# Patient Record
Sex: Male | Born: 1960
Health system: Southern US, Community
[De-identification: ages and names within clinical notes are randomized; demographics above are authoritative.]

## PROBLEM LIST (undated history)

## (undated) ENCOUNTER — Ambulatory Visit: Payer: BC Managed Care – PPO | Source: Home / Self Care

## (undated) DIAGNOSIS — I10 Essential (primary) hypertension: Secondary | ICD-10-CM

## (undated) DIAGNOSIS — T7840XA Allergy, unspecified, initial encounter: Secondary | ICD-10-CM

## (undated) DIAGNOSIS — L723 Sebaceous cyst: Secondary | ICD-10-CM

## (undated) DIAGNOSIS — Z87442 Personal history of urinary calculi: Secondary | ICD-10-CM

## (undated) DIAGNOSIS — F419 Anxiety disorder, unspecified: Secondary | ICD-10-CM

## (undated) DIAGNOSIS — E785 Hyperlipidemia, unspecified: Secondary | ICD-10-CM

## (undated) DIAGNOSIS — E119 Type 2 diabetes mellitus without complications: Secondary | ICD-10-CM

## (undated) DIAGNOSIS — F32A Depression, unspecified: Secondary | ICD-10-CM

## (undated) DIAGNOSIS — F329 Major depressive disorder, single episode, unspecified: Secondary | ICD-10-CM

## (undated) DIAGNOSIS — R011 Cardiac murmur, unspecified: Secondary | ICD-10-CM

## (undated) HISTORY — DX: Anxiety disorder, unspecified: F41.9

## (undated) HISTORY — DX: Allergy, unspecified, initial encounter: T78.40XA

## (undated) HISTORY — PX: TYMPANOSTOMY TUBE PLACEMENT: SHX32

## (undated) HISTORY — PX: TONSILLECTOMY: SHX5217

## (undated) HISTORY — DX: Hyperlipidemia, unspecified: E78.5

## (undated) HISTORY — DX: Cardiac murmur, unspecified: R01.1

## (undated) HISTORY — DX: Personal history of urinary calculi: Z87.442

## (undated) HISTORY — DX: Sebaceous cyst: L72.3

## (undated) HISTORY — DX: Essential (primary) hypertension: I10

## (undated) HISTORY — DX: Major depressive disorder, single episode, unspecified: F32.9

## (undated) HISTORY — PX: OTHER SURGICAL HISTORY: SHX169

## (undated) HISTORY — DX: Depression, unspecified: F32.A

---

## 2005-08-01 ENCOUNTER — Ambulatory Visit: Payer: Self-pay | Admitting: Internal Medicine

## 2005-12-06 ENCOUNTER — Ambulatory Visit: Payer: Self-pay | Admitting: Internal Medicine

## 2005-12-12 ENCOUNTER — Ambulatory Visit: Payer: Self-pay | Admitting: Internal Medicine

## 2006-04-13 ENCOUNTER — Ambulatory Visit: Payer: Self-pay | Admitting: Internal Medicine

## 2006-04-17 ENCOUNTER — Ambulatory Visit: Payer: Self-pay | Admitting: Internal Medicine

## 2006-08-15 ENCOUNTER — Ambulatory Visit: Payer: Self-pay | Admitting: Internal Medicine

## 2006-12-11 ENCOUNTER — Ambulatory Visit: Payer: Self-pay | Admitting: Family Medicine

## 2008-01-30 ENCOUNTER — Telehealth: Payer: Self-pay | Admitting: Internal Medicine

## 2008-02-06 ENCOUNTER — Ambulatory Visit: Payer: Self-pay | Admitting: Internal Medicine

## 2008-02-06 DIAGNOSIS — R51 Headache: Secondary | ICD-10-CM | POA: Insufficient documentation

## 2008-02-06 DIAGNOSIS — F411 Generalized anxiety disorder: Secondary | ICD-10-CM | POA: Insufficient documentation

## 2008-02-06 DIAGNOSIS — I10 Essential (primary) hypertension: Secondary | ICD-10-CM | POA: Insufficient documentation

## 2008-02-06 DIAGNOSIS — F3289 Other specified depressive episodes: Secondary | ICD-10-CM | POA: Insufficient documentation

## 2008-02-06 DIAGNOSIS — F988 Other specified behavioral and emotional disorders with onset usually occurring in childhood and adolescence: Secondary | ICD-10-CM | POA: Insufficient documentation

## 2008-02-06 DIAGNOSIS — E785 Hyperlipidemia, unspecified: Secondary | ICD-10-CM | POA: Insufficient documentation

## 2008-02-06 DIAGNOSIS — F329 Major depressive disorder, single episode, unspecified: Secondary | ICD-10-CM

## 2008-02-06 DIAGNOSIS — J309 Allergic rhinitis, unspecified: Secondary | ICD-10-CM | POA: Insufficient documentation

## 2008-02-06 DIAGNOSIS — R519 Headache, unspecified: Secondary | ICD-10-CM | POA: Insufficient documentation

## 2008-02-06 LAB — CONVERTED CEMR LAB
Cholesterol, target level: 200 mg/dL
HDL goal, serum: 40 mg/dL
LDL Goal: 130 mg/dL

## 2008-02-10 DIAGNOSIS — Z87442 Personal history of urinary calculi: Secondary | ICD-10-CM | POA: Insufficient documentation

## 2008-02-28 ENCOUNTER — Ambulatory Visit: Payer: Self-pay | Admitting: Internal Medicine

## 2008-03-02 LAB — CONVERTED CEMR LAB
Basophils Absolute: 0.1 10*3/uL (ref 0.0–0.1)
Bilirubin, Direct: 0.1 mg/dL (ref 0.0–0.3)
Calcium: 9.3 mg/dL (ref 8.4–10.5)
Cholesterol: 201 mg/dL (ref 0–200)
Direct LDL: 136.7 mg/dL
GFR calc Af Amer: 83 mL/min
Glucose, Bld: 103 mg/dL — ABNORMAL HIGH (ref 70–99)
HCT: 50.1 % (ref 39.0–52.0)
Hemoglobin: 16.8 g/dL (ref 13.0–17.0)
MCHC: 33.5 g/dL (ref 30.0–36.0)
Monocytes Absolute: 0.7 10*3/uL (ref 0.2–0.7)
Neutro Abs: 5.8 10*3/uL (ref 1.4–7.7)
RDW: 12.3 % (ref 11.5–14.6)
Sodium: 136 meq/L (ref 135–145)
TSH: 0.72 microintl units/mL (ref 0.35–5.50)
Total Bilirubin: 0.9 mg/dL (ref 0.3–1.2)
Total Protein: 6.4 g/dL (ref 6.0–8.3)

## 2008-03-10 ENCOUNTER — Ambulatory Visit: Payer: Self-pay | Admitting: Internal Medicine

## 2008-03-10 DIAGNOSIS — T50995A Adverse effect of other drugs, medicaments and biological substances, initial encounter: Secondary | ICD-10-CM | POA: Insufficient documentation

## 2008-06-23 ENCOUNTER — Ambulatory Visit: Payer: Self-pay | Admitting: Internal Medicine

## 2008-06-23 LAB — CONVERTED CEMR LAB
Cholesterol: 237 mg/dL (ref 0–200)
VLDL: 27 mg/dL (ref 0–40)

## 2008-06-30 ENCOUNTER — Ambulatory Visit: Payer: Self-pay | Admitting: Internal Medicine

## 2008-09-25 ENCOUNTER — Ambulatory Visit: Payer: Self-pay | Admitting: Internal Medicine

## 2008-09-25 DIAGNOSIS — M549 Dorsalgia, unspecified: Secondary | ICD-10-CM | POA: Insufficient documentation

## 2008-09-25 LAB — CONVERTED CEMR LAB
Bilirubin Urine: NEGATIVE
Blood in Urine, dipstick: NEGATIVE
Urobilinogen, UA: 0.2

## 2008-12-24 ENCOUNTER — Ambulatory Visit: Payer: Self-pay | Admitting: Internal Medicine

## 2008-12-24 LAB — CONVERTED CEMR LAB
Cholesterol: 228 mg/dL (ref 0–200)
HDL: 41.5 mg/dL (ref >39.0)
Total CHOL/HDL Ratio: 5.5
Triglycerides: 86 mg/dL (ref 0–149)
VLDL: 17 mg/dL (ref 0–40)

## 2008-12-31 ENCOUNTER — Ambulatory Visit: Payer: Self-pay | Admitting: Internal Medicine

## 2009-03-26 ENCOUNTER — Ambulatory Visit: Payer: Self-pay | Admitting: Family Medicine

## 2009-03-26 DIAGNOSIS — J209 Acute bronchitis, unspecified: Secondary | ICD-10-CM | POA: Insufficient documentation

## 2009-04-15 ENCOUNTER — Ambulatory Visit: Payer: Self-pay | Admitting: Internal Medicine

## 2009-04-15 DIAGNOSIS — R5383 Other fatigue: Secondary | ICD-10-CM

## 2009-04-15 DIAGNOSIS — R5381 Other malaise: Secondary | ICD-10-CM | POA: Insufficient documentation

## 2010-03-23 ENCOUNTER — Ambulatory Visit: Payer: Self-pay | Admitting: Internal Medicine

## 2010-03-23 LAB — CONVERTED CEMR LAB
ALT: 31 units/L (ref 0–53)
Albumin: 4.3 g/dL (ref 3.5–5.2)
Alkaline Phosphatase: 54 units/L (ref 39–117)
Basophils Relative: 0.3 % (ref 0.0–3.0)
Bilirubin Urine: NEGATIVE
Bilirubin, Direct: 0 mg/dL (ref 0.0–0.3)
CO2: 27 meq/L (ref 19–32)
Chloride: 108 meq/L (ref 96–112)
Direct LDL: 192 mg/dL
Eosinophils Absolute: 0.2 10*3/uL (ref 0.0–0.7)
Eosinophils Relative: 2.4 % (ref 0.0–5.0)
Hemoglobin: 17 g/dL (ref 13.0–17.0)
Lymphocytes Relative: 28.3 % (ref 12.0–46.0)
MCHC: 34.4 g/dL (ref 30.0–36.0)
MCV: 91.3 fL (ref 78.0–100.0)
Monocytes Absolute: 0.8 10*3/uL (ref 0.1–1.0)
Neutro Abs: 5.5 10*3/uL (ref 1.4–7.7)
Neutrophils Relative %: 59.9 % (ref 43.0–77.0)
Nitrite: NEGATIVE
Potassium: 4.1 meq/L (ref 3.5–5.1)
Protein, U semiquant: NEGATIVE
RBC: 5.42 M/uL (ref 4.22–5.81)
Sodium: 143 meq/L (ref 135–145)
Total CHOL/HDL Ratio: 5
Total Protein: 6.9 g/dL (ref 6.0–8.3)
Urobilinogen, UA: 0.2
VLDL: 31 mg/dL (ref 0.0–40.0)
WBC Urine, dipstick: NEGATIVE
WBC: 9.2 10*3/uL (ref 4.5–10.5)

## 2010-03-31 ENCOUNTER — Ambulatory Visit: Payer: Self-pay | Admitting: Internal Medicine

## 2010-03-31 DIAGNOSIS — D485 Neoplasm of uncertain behavior of skin: Secondary | ICD-10-CM | POA: Insufficient documentation

## 2010-05-18 ENCOUNTER — Encounter (INDEPENDENT_AMBULATORY_CARE_PROVIDER_SITE_OTHER): Payer: Self-pay | Admitting: *Deleted

## 2010-06-21 ENCOUNTER — Encounter (INDEPENDENT_AMBULATORY_CARE_PROVIDER_SITE_OTHER): Payer: Self-pay | Admitting: *Deleted

## 2010-06-23 ENCOUNTER — Ambulatory Visit: Payer: Self-pay | Admitting: Gastroenterology

## 2010-07-05 ENCOUNTER — Ambulatory Visit: Payer: Self-pay | Admitting: Gastroenterology

## 2010-07-05 HISTORY — PX: COLONOSCOPY: SHX174

## 2010-07-05 LAB — HM COLONOSCOPY

## 2010-07-07 ENCOUNTER — Encounter: Payer: Self-pay | Admitting: Gastroenterology

## 2011-01-04 NOTE — Procedures (Signed)
Summary: Colonoscopy  Patient: Jonathan [name] Note: All result statuses are Final unless otherwise noted.  Tests: (1) Colonoscopy (COL)   COL Colonoscopy           DONE     [facility]     [address]     [place], [place]  [address]           COLONOSCOPY PROCEDURE REPORT           PATIENT:  Jonathan [name], Jonathan [name]  MR#:  [id number]     BIRTHDATE:  12/25/1960, 48 yrs. old  GENDER:  male           ENDOSCOPIST:  Barbette Hair. Arlyce Dice, MD     Referred by:  Neta Mends. Panosh, M.D.           PROCEDURE DATE:  07/05/2010     PROCEDURE:  Colonoscopy with snare polypectomy     ASA CLASS:  Class I     INDICATIONS:  1) Routine Risk Screening           MEDICATIONS:   Fentanyl 100 mcg IV, Versed 10 mg IV           DESCRIPTION OF PROCEDURE:   After the risks benefits and     alternatives of the procedure were thoroughly explained, informed     consent was obtained.  Digital rectal exam was performed and     revealed no abnormalities.   The LB CF-[id number] endoscope     was introduced through the anus and advanced to the cecum, which     was identified by both the appendix and ileocecal valve, without     limitations.  The quality of the prep was excellent, using     MoviPrep.  The instrument was then slowly withdrawn as the colon     was fully examined.     <<PROCEDUREIMAGES>>           FINDINGS:  A sessile polyp was found in the descending colon. It     was 5 mm in size. Polyps were snared, then cauterized with     monopolar cautery. Retrieval was successful (see image14). snare     polyp  This was otherwise a normal examination of the colon (see     image2, image3, image4, image6, image8, image9, image12, image13,     image15, and image16).   Retroflexed views in the rectum revealed     no abnormalities.    The time to cecum =  4.5  minutes. The scope     was then withdrawn (time =  7.75  min) from the patient and the     procedure completed.           COMPLICATIONS:  None       ENDOSCOPIC IMPRESSION:     1) 5 mm sessile polyp in the descending colon     2) Otherwise normal examination     RECOMMENDATIONS:     1) If the polyp(s) removed today are proven to be adenomatous     (pre-cancerous) polyps, you will need a repeat colonoscopy in 5     years. Otherwise you should continue to follow colorectal cancer     screening guidelines for "routine risk" patients with colonoscopy     in 10 years.           REPEAT EXAM:   You will receive a letter from Dr. Arlyce Dice in 1-2     weeks, after reviewing the  final pathology, with followup     recommendations.           ______________________________     Barbette Hair Arlyce Dice, MD           CC:           n.     eSIGNED:   Barbette Hair. Nainika Newlun at 07/05/2010 09:50 AM           Vic Blackbird, [id number]  Note: An exclamation mark (!) indicates a result that was not dispersed into the flowsheet. Document Creation Date: 07/05/2010 9:51 AM _______________________________________________________________________  (1) Order result status: Final Collection or observation date-time: 07/05/2010 09:31 Requested date-time:  Receipt date-time:  Reported date-time:  Referring Physician:   Ordering Physician: Melvia Heaps [id number]) Specimen Source:  Source: Launa Grill Order Number: [id number] Lab site:   Appended Document: Colonoscopy     Procedures Next Due Date:    Colonoscopy: 07/2020

## 2011-01-04 NOTE — Miscellaneous (Signed)
Summary: [facility]  Clinical Lists Changes  Medications: Added new medication of MOVIPREP 100 GM  SOLR (PEG-KCL-NACL-NASULF-NA ASC-C) As per prep instructions. - Signed Rx of MOVIPREP 100 GM  SOLR (PEG-KCL-NACL-NASULF-NA ASC-C) As per prep instructions.;  #1 x 0;  Signed;  Entered by: Ezra Sites RN;  Authorized by: Louis Meckel MD;  Method used: [facility] [address] [phone]*, [address], [place], [place], [place]  [address], Ph: [phone] or [phone], Fax: [phone] Observations: Added new observation of ALLERGY REV: Done (06/23/2010 8:30)    Prescriptions: MOVIPREP 100 GM  SOLR (PEG-KCL-NACL-NASULF-NA ASC-C) As per prep instructions.  #1 x 0   Entered by:   Ezra Sites RN   Authorized by:   Louis Meckel MD   Signed by:   Ezra Sites RN on 06/23/2010   Method used:   [facility]ronically to        [facility] [address] [phone]* (retail)       [address]       [place], [place]  [address]       Ph: [phone] or [phone]       Fax: [phone]   RxID:   [id number]

## 2011-01-04 NOTE — Letter (Signed)
Summary: Previsit letter  [facility]  [address] [place], [place] [address]   Phone: [phone]  Fax: [phone]       05/18/2010 MRN: [id number]  [facility] [address] [place], [place]  [address]  Dear Mr. Jonathan [name],  Welcome to the Gastroenterology Division at [facility].    You are scheduled to see a nurse for your pre-procedure visit on 06-23-10 at 8:30am on the 3rd floor at [facility], [address]  We ask that you try to arrive at our office 15 minutes prior to your appointment time to allow for check-in.  Your nurse visit will consist of discussing your medical and surgical history, your immediate family medical history, and your medications.    Please bring a complete list of all your medications or, if you prefer, bring the medication bottles and we will list them.  We will need to be aware of both prescribed and over the counter drugs.  We will need to know exact dosage information as well.  If you are on blood thinners (Coumadin, Plavix, Aggrenox, Ticlid, etc.) please call our office today/prior to your appointment, as we need to consult with your physician about holding your medication.   Please be prepared to read and sign documents such as consent forms, a financial agreement, and acknowledgement forms.  If necessary, and with your consent, a friend or relative is welcome to sit-in on the nurse visit with you.  Please bring your insurance card so that we may make a copy of it.  If your insurance requires a referral to see a specialist, please bring your referral form from your primary care physician.  No co-pay is required for this nurse visit.     If you cannot keep your appointment, please call [phone] to cancel or reschedule prior to your appointment date.  This allows [place] the opportunity to schedule an appointment for another patient in need of care.    Thank you for choosing [facility] for your medical  needs.  We appreciate the opportunity to care for you.  Please visit [place] at our website  to learn more about our practice.                     Sincerely.                                                                                                                   The Gastroenterology Division

## 2011-01-04 NOTE — Assessment & Plan Note (Signed)
Summary: CPX // RS   Vital Signs:  Patient profile:   50 year old male Height:      67.25 inches Weight:      192 pounds BMI:     29.96 Pulse rate:   88 / minute BP sitting:   140 / 84  (right arm) Cuff size:   regular  Vitals Entered By: Romualdo Bolk, CMA Duncan Dull) (March 31, 2010 11:42 AM)  Nutrition Counseling: Patient's BMI is greater than 25 and therefore counseled on weight management options. CC: CPX- Pt is having pain in lower left back area for 1 month or so and is worse in am.   History of Present Illness: Jonathan [name] comes  for preventive visit  .  changing diet since dad put on dialysis  for renal cancer Dm and ht .      HT :    usually doing well    not taking   1 per day of med and feels well .   120/ range is normal for him   Back:    Left pain 2 weeks ago no injury and down back   no weakness  .  better now  if recurrent . LIPIDs : eating better and not   exercising   by effort   for now.   had se of  a statind and not going to take this .     Father has had facial skin cancers and has / about acouple of lesions.        Preventive Care Screening  Prior Values:    PSA:  0.82 (03/23/2010)   Preventive Screening-Counseling & Management  Alcohol-Tobacco     Alcohol drinks/day: <1     Alcohol type: Beer     Smoking Status: never  Caffeine-Diet-Exercise     Caffeine use/day: <1     Does Patient Exercise: yes     Type of exercise: walking     Times/week: 7  Hep-HIV-STD-Contraception     Dental Visit-last 6 months yes     Sun Exposure-Excessive: no  Safety-Violence-Falls     Seat Belt Use: yes     Firearms in the Home: no firearms in the home     Smoke Detectors: yes  Comments: uses sunscreen and hat s      Blood Transfusions:  no.    EKG  Procedure date:  03/31/2010  Findings:      Normal sinus rhythm with rate of:  82  Current Medications (verified): 1)  Lisinopril-Hydrochlorothiazide 20-12.5 Mg Tabs  (Lisinopril-Hydrochlorothiazide) .... Take 2 By Mouth Daily 2)  Probiotic  Caps (Misc Intestinal Flora Regulat) 3)  B Complete  Tabs (B Complex-Biotin-Fa) 4)  Quercetin 250 Mg Tabs (Quercetin)  Allergies (verified): 1)  ! Penicillin 2)  * Simvistatin  Past History:  Past medical, surgical, family and social histories (including risk factors) reviewed, and no changes noted (except as noted below).  Past Medical History: Reviewed history from 12/31/2008 and no changes required. Allergic rhinitis Anxiety Depression Hyperlipidemia Hypertension Nephrolithiasis, hx of        Consults Dr. Theodosia Paling  Past Surgical History: Tonsillectomy x 2  PE tubes in the past Kidney Stones removed x 2    Past History:  Care Management: Chiropractor: Nazar  Family History: Reviewed history from 12/31/2008 and no changes required. Family History Hypertension Fhx DM ETS mom Father: Diabetes, HBP    now on dialysis   renal cancer   Siblings: Brother-healthy Mother: No health problems  Social History: Reviewed history from 12/31/2008 and no changes required. Married Never Smoked Alcohol use-yes  less than 1 per week. Drug use-no Regular exercise-no hh of  3   dog cat and lizard.  Sleep  wakening   at times         Seat Belt Use:  yes Dental Care w/in 6 mos.:  yes Sun Exposure-Excessive:  no Blood Transfusions:  no  Review of Systems  The patient denies anorexia, fever, weight loss, weight gain, decreased hearing, hoarseness, chest pain, syncope, dyspnea on exertion, peripheral edema, prolonged cough, headaches, hemoptysis, abdominal pain, melena, hematochezia, severe indigestion/heartburn, hematuria, incontinence, genital sores, muscle weakness, transient blindness, difficulty walking, depression, abnormal bleeding, enlarged lymph nodes, and angioedema.   Physical Exam General Appearance: well developed, well nourished, no acute distress Eyes: conjunctiva and lids normal,  PERRLA, EOMI, fuWNL Ears, Nose, Mouth, Throat: TM clear, nares clear, oral exam WNL Neck: supple, no lymphadenopathy, no thyromegaly, no JVD Respiratory: clear to auscultation and percussion, respiratory effort normal Cardiovascular: regular rate and rhythm, S1-S2, no murmur, rub or gallop, no bruits, peripheral pulses normal and symmetric, no cyanosis, clubbing, edema or varicosities Chest: no scars, masses, tenderness; no asymmetry, skin changes, nipple discharge, no gynecomastia   Gastrointestinal: soft, non-tender; no hepatosplenomegaly, masses; active bowel sounds all quadrants,  Genitourinary: declined rectal today  to get  colonoscopy Lymphatic: no cervical, axillary or inguinal adenopathy Musculoskeletal: gait normal, muscle tone and strength WNL, no joint swelling, effusions, discoloration, crepitus  Skin:  good turgor, color WNL, no rashes,  or ulcerations  sun changes   2 mm flesh colored area left temple with scaling and redness    left forearm with warty appearing  lesion 3-4 mm  and other red  areas on forearms and face   Neurologic: normal mental status, normal reflexes, normal strength, sensation, and motion Psychiatric: alert; oriented to person, place and time Other Exam:   EKG NSR   see labs  nl except for   ldl 195    Impression & Recommendations:  Problem # 1:  PREVENTIVE HEALTH CARE (ICD-V70.0) Discussed nutrition,exercise,diet,healthy weight, vitamin D and calcium.  Orders: EKG w/ Interpretation ([address])  Problem # 2:  HYPERTENSION (ICD-401.9) Pt says better at home   continue.   His updated medication list for this problem includes:    Lisinopril-hydrochlorothiazide 20-12.5 Mg Tabs (Lisinopril-hydrochlorothiazide) .Marland Kitchen... Take 1 by mouth daily for high blood pressure  Orders: EKG w/ Interpretation ([address])  BP today: 140/84 Prior BP: 108/70 (04/15/2009)  Prior 10 Yr Risk Heart Disease: Not enough information (02/06/2008)  Labs Reviewed: K+: 4.1  (03/23/2010) Creat: : 1.1 (03/23/2010)   Chol: 262 (03/23/2010)   HDL: 52.40 (03/23/2010)   LDL: DEL (12/24/2008)   TG: 155.0 (03/23/2010)  Problem # 3:  HYPERLIPIDEMIA (ICD-272.4)  disc weight loss exercise to help with this as he had a se of statin and doesnt want to take any at this time .   This is acceptable   as he could lose weight and probably do better with this  Orders: EKG w/ Interpretation ([address])  Labs Reviewed: SGOT: 22 (03/23/2010)   SGPT: 31 (03/23/2010)  Lipid Goals: Chol Goal: 200 (02/06/2008)   HDL Goal: 40 (02/06/2008)   LDL Goal: 130 (02/06/2008)   TG Goal: 150 (02/06/2008)  Prior 10 Yr Risk Heart Disease: Not enough information (02/06/2008)   HDL:52.40 (03/23/2010), 41.5 (12/24/2008)  LDL:DEL (12/24/2008), DEL (06/23/2008)  Chol:262 (03/23/2010), 228 (12/24/2008)  Trig:155.0 (03/23/2010), 86 (12/24/2008)  Problem # 4:  SKIN LESION, UNCERTAIN SIGNIFICANCE (ICD-238.2) poss actininc  multiples nad 2 other lesions temple  and left wrist.  Orders: Dermatology Referral (Derma)  Problem # 5:  BACK PAIN, LEFT (ICD-724.5) seems mechanical but no alarm signs today   will observe and follow up  if persistent or  progressive   The following medications were removed from the medication list:    Cyclobenzaprine Hcl 10 Mg Tabs (Cyclobenzaprine hcl) .Marland Kitchen... 1 by mouth three times a day as needed back spasm  Problem # 6:  hx of se of statin medication in the past  pt doesnt want to take any statin because of se .   Complete Medication List: 1)  Lisinopril-hydrochlorothiazide 20-12.5 Mg Tabs (Lisinopril-hydrochlorothiazide) .... Take 1 by mouth daily for high blood pressure 2)  Probiotic Caps (Misc intestinal flora regulat) 3)  B Complete Tabs (B complex-biotin-fa) 4)  Quercetin 250 Mg Tabs (Quercetin)  Other Orders: Tdap => 75yrs IM (19147) Admin 1st Vaccine (82956) Gastroenterology Referral (GI)  Patient Instructions: 1)  will do derm referral . 2)  colonosopy referral   . 3)  do skin protection. 4)  WEIGHT   loss and exercise  is reccommended   to help your LIPIDs and    cardiac risk factors. 5)  You need to use up 3500 calories more than intake to lose one pound of body weight.     6)  Rec recording 2 weeks of intake and   exercise to help.  7)  Your blood pressure  readings should be  below   140/90   8)  Bring in your blood pressure machine  at next visit.  9)  Please schedule a follow-up appointment in 6 months .  10)  Lipid panel prior to visit ICD-9 :  272.4  Prescriptions: LISINOPRIL-HYDROCHLOROTHIAZIDE 20-12.5 MG TABS (LISINOPRIL-HYDROCHLOROTHIAZIDE) take 1 by mouth daily for high blood pressure  #90 x 3   Entered and Authorized by:   Madelin Headings MD   Signed by:   Madelin Headings MD on 03/31/2010   Method used:   [facility] [address] [phone]* (retail)       [address]       [place], [place]  [address]       Ph: [phone] or [phone]       Fax: [phone]   RxID:   [id number]     Immunizations Administered:  Tetanus Vaccine:    Vaccine Type: Tdap    Site: right deltoid    Mfr: GlaxoSmithKline    Dose: 0.5 ml    Route: IM    Given by: Romualdo Bolk, CMA (AAMA)    Exp. Date: 02/27/2012    Lot #: [id number]

## 2011-01-04 NOTE — Letter (Signed)
Summary: Patient Notice-Hyperplastic Polyps  [facility]  [address] [place], [place] [address]   Phone: [phone]  Fax: [phone]    July 07, 2010 MRN: [id number]    [facility] [address] [place], [place]  [address]    Dear Mr. Jonathan [name],  I am pleased to inform you that the colon polyp(s) removed during your recent colonoscopy was (were) found to be hyperplastic.  These types of polyps are NOT pre-cancerous.  It is therefore my recommendation that you have a repeat colonoscopy examination in 10_ years for routine colorectal cancer screening.  Should you develop new or worsening symptoms of abdominal pain, bowel habit changes or bleeding from the rectum or bowels, please schedule an evaluation with either your primary care physician or with me.  Additional information/recommendations:  __No further action with gastroenterology is needed at this time.      Please follow-up with your primary care physician for your other      healthcare needs. __Please call [phone] to schedule a return visit to review      your situation.  __Please keep your follow-up visit as already scheduled.  _x_Continue treatment plan as outlined the day of your exam.  Please call us if you are having persistent problems or have questions about your condition that have not been fully answered at this time.  Sincerely,  Louis Meckel MD This letter has been electronically signed by your physician.  Appended Document: Patient Notice-Hyperplastic Polyps Letter mailed 8.3.2011

## 2011-01-04 NOTE — Letter (Signed)
Summary: [facility] Instructions  [facility]  [address] [place], [place] [address]   Phone: [phone]  Fax: [phone]       Jonathan [name]    1961-05-14    MRN: [id number]        Procedure Day /Date:  Monday 07/05/2010     Arrival Time: 8:00 am      Procedure Time: 9:00 am    Location of Procedure:                    _ x_  [facility] (4th Floor)                        PREPARATION FOR COLONOSCOPY WITH MOVIPREP   Starting 5 days prior to your procedure Wednesday 7/27 do not eat nuts, seeds, popcorn, corn, beans, peas,  salads, or any raw vegetables.  Do not take any fiber supplements (e.g. Metamucil, Citrucel, and Benefiber).  THE DAY BEFORE YOUR PROCEDURE         DATE: Sunday 7/31  1.  Drink clear liquids the entire day-NO SOLID FOOD  2.  Do not drink anything colored red or purple.  Avoid juices with pulp.  No orange juice.  3.  Drink at least 64 oz. (8 glasses) of fluid/clear liquids during the day to prevent dehydration and help the prep work efficiently.  CLEAR LIQUIDS INCLUDE: Water Jello Ice Popsicles Tea (sugar ok, no milk/cream) Powdered fruit flavored drinks Coffee (sugar ok, no milk/cream) Gatorade Juice: apple, white grape, white cranberry  Lemonade Clear bullion, consomm, broth Carbonated beverages (any kind) Strained chicken noodle soup Hard Candy                             4.  In the morning, mix first dose of MoviPrep solution:    Empty 1 Pouch A and 1 Pouch B into the disposable container    Add lukewarm drinking water to the top line of the container. Mix to dissolve    Refrigerate (mixed solution should be used within 24 hrs)  5.  Begin drinking the prep at 5:00 p.m. The MoviPrep container is divided by 4 marks.   Every 15 minutes drink the solution down to the next mark (approximately 8 oz) until the full liter is complete.   6.  Follow completed prep with 16 oz of clear liquid of your choice (Nothing  red or purple).  Continue to drink clear liquids until bedtime.  7.  Before going to bed, mix second dose of MoviPrep solution:    Empty 1 Pouch A and 1 Pouch B into the disposable container    Add lukewarm drinking water to the top line of the container. Mix to dissolve    Refrigerate  THE DAY OF YOUR PROCEDURE      DATE: Monday 8/1  Beginning at 4:00 a.m. (5 hours before procedure):         1. Every 15 minutes, drink the solution down to the next mark (approx 8 oz) until the full liter is complete.  2. Follow completed prep with 16 oz. of clear liquid of your choice.    3. You may drink clear liquids until 7:00 am (2 HOURS BEFORE PROCEDURE).   MEDICATION INSTRUCTIONS  Unless otherwise instructed, you should take regular prescription medications with a small sip of water   as early as possible the morning of your  procedure.   Additional medication instructions:   Do not take Lisinopril/HCTZ day of procedure.         OTHER INSTRUCTIONS  You will need a responsible adult at least 52 years of age to accompany you and drive you home.   This person must remain in the waiting room during your procedure.  Wear loose fitting clothing that is easily removed.  Leave jewelry and other valuables at home.  However, you may wish to bring a book to read or  an iPod/MP3 player to listen to music as you wait for your procedure to start.  Remove all body piercing jewelry and leave at home.  Total time from sign-in until discharge is approximately 2-3 hours.  You should go home directly after your procedure and rest.  You can resume normal activities the  day after your procedure.  The day of your procedure you should not:   Drive   Make legal decisions   Operate machinery   Drink alcohol   Return to work  You will receive specific instructions about eating, activities and medications before you leave.    The above instructions have been reviewed and explained to me by    Ezra Sites RN  June 23, 2010 8:46 AM    I fully understand and can verbalize these instructions _____________________________ Date _________

## 2011-05-05 ENCOUNTER — Encounter: Payer: Self-pay | Admitting: Internal Medicine

## 2011-05-06 ENCOUNTER — Encounter: Payer: Self-pay | Admitting: Internal Medicine

## 2011-05-06 ENCOUNTER — Ambulatory Visit (INDEPENDENT_AMBULATORY_CARE_PROVIDER_SITE_OTHER): Payer: Self-pay | Admitting: Internal Medicine

## 2011-05-06 VITALS — BP 156/100 | HR 93 | Temp 98.5°F | Wt 189.0 lb

## 2011-05-06 DIAGNOSIS — R05 Cough: Secondary | ICD-10-CM | POA: Insufficient documentation

## 2011-05-06 DIAGNOSIS — J189 Pneumonia, unspecified organism: Secondary | ICD-10-CM

## 2011-05-06 DIAGNOSIS — J309 Allergic rhinitis, unspecified: Secondary | ICD-10-CM

## 2011-05-06 DIAGNOSIS — I10 Essential (primary) hypertension: Secondary | ICD-10-CM

## 2011-05-06 DIAGNOSIS — R059 Cough, unspecified: Secondary | ICD-10-CM

## 2011-05-06 MED ORDER — AZITHROMYCIN 250 MG PO TABS: 250.0000 mg | ORAL_TABLET | ORAL | Status: AC

## 2011-05-06 MED ORDER — FLUTICASONE PROPIONATE 50 MCG/ACT NA SUSP: 2.0000 | Freq: Every day | NASAL | Status: DC

## 2011-05-06 NOTE — Patient Instructions (Addendum)
antibiotic for reasons discussed Normally acute bronchitis is viral and cough last 2-4 weeks  But you could have underlying allergy contributing .  Add flonase every day  And see if helps the am cough and congestion.  Recheck or call if not sig improvement in the next 7 days or if gets fever  Etc.  Otherwise ROV if persistent cough .   Check you BP readings to ensure  Control.

## 2011-05-06 NOTE — Progress Notes (Signed)
Subjective:    Patient ID: Jonathan [name], male    DOB: Apr 16, 1961, 50 y.o.   MRN: [id number]  HPI Patient comes in today for poss bronchitis. Acute illness. After traveling to [place] Acute onset over the last few days the past days  severe Sore throat first.  Then head  Face pressure.  Then had significant fever for which he took over-the-counter medication.  Slept ok last night and some better.  This a.m. No shortness of breath today nausea vomiting unusual skin rashes.  He is worried about having some underlying problem with coughing.  Hx of coughing for almost a month in March  " bronchitis"   . He did get better from this.  Concerned allergen adding to the issue. Tends to have problems in the spring. Early morning  Cough  On a reg basis  And  PND in the morning. But no wheezing asthma chest pain shortness of breath otherwise.   Review of Systems Fever last pm better today. Blood pressure had been okay until he got sick did not take his medication today yet. Last chest x-ray was a while back. Non smoker  daughter with allergies Past Medical History  Diagnosis Date  . Allergic rhinitis   . Anxiety   . Depression   . Hyperlipidemia   . Hypertension   . History of nephrolithiasis    Past Surgical History  Procedure Date  . Tonsillectomy     x 2  . Tympanostomy tube placement   . Kidney stones removed     x 2    reports that he has never smoked. He does not have any smokeless tobacco history on file. He reports that he drinks alcohol. He reports that he does not use illicit drugs. family history includes Diabetes in his father; Hypertension in his father; and Kidney cancer in his father. Allergies  Allergen Reactions  . Penicillins        Objective:   Physical Exam Well-developed well-nourished in no acute distress looks mildly ill and tired. Voice is mildly hoarse HEENT: Normocephalic ;atraumatic , Eyes;  PERRL, EOMs  Full, lids and conjunctiva clear,,Ears: no  deformities, canals nl, TM landmarks normal, Nose: no deformity or discharge  congested without face pain Mouth : OP clear without lesion or edema . There is cobblestoning with redness in the posterior pharyngeal wall but no ulcers. Neck supple shotty nodes complains of tenderness in the anterior throat. Chest:  Coarse breath sounds and decreased left base and slightly right base this persists after coughing. Rest of lung fields are normal without wheezing. CV:  S1-S2 no gallops or murmurs peripheral perfusion is normal Skin: Normal perfusion no acute rashes.    Oriented x 3 and no noted deficits in memory, attention, and speech.   Assessment & Plan:  Acute respiratory infection concern about early pneumonitis( left) with history of fever.  Other concerned about recurrent cough with respiratory infection. This tends to be seasonal for him. Certainly he could have some underlying seasonal allergy. Also discussed implications of an ACE inhibitor or that could prolong her cough.  Discussed  at length of other treatment options but for now we'll add Flonase nasal spray every day and planned followup at the cough is persisting. Consider chest x-ray other evaluation. Suggested in the spring when these things start to get on allergy medication early. Add nasal steroid and antibiotic today.  Hypertension  good readings today has missed medicine. Consider temporarily changing to Cozaar HCTZ if needed because  of prolonged cough.    Total visit > 50% spent counseling and coordinating care

## 2011-05-17 ENCOUNTER — Encounter: Payer: Self-pay | Admitting: Internal Medicine

## 2011-05-22 ENCOUNTER — Other Ambulatory Visit: Payer: Self-pay | Admitting: Internal Medicine

## 2011-09-26 ENCOUNTER — Other Ambulatory Visit: Payer: Self-pay | Admitting: Internal Medicine

## 2012-02-14 ENCOUNTER — Other Ambulatory Visit: Payer: Self-pay | Admitting: Internal Medicine

## 2012-04-02 ENCOUNTER — Telehealth (INDEPENDENT_AMBULATORY_CARE_PROVIDER_SITE_OTHER): Payer: Self-pay | Admitting: General Surgery

## 2012-04-02 ENCOUNTER — Encounter: Payer: Self-pay | Admitting: Internal Medicine

## 2012-04-02 ENCOUNTER — Encounter (INDEPENDENT_AMBULATORY_CARE_PROVIDER_SITE_OTHER): Payer: Self-pay | Admitting: General Surgery

## 2012-04-02 ENCOUNTER — Ambulatory Visit (INDEPENDENT_AMBULATORY_CARE_PROVIDER_SITE_OTHER): Payer: BC Managed Care – PPO | Admitting: Internal Medicine

## 2012-04-02 ENCOUNTER — Ambulatory Visit (INDEPENDENT_AMBULATORY_CARE_PROVIDER_SITE_OTHER): Payer: BC Managed Care – PPO | Admitting: General Surgery

## 2012-04-02 VITALS — BP 108/80 | HR 77 | Temp 98.3°F | Wt 197.0 lb

## 2012-04-02 VITALS — BP 127/86 | HR 80 | Temp 98.3°F | Resp 16 | Ht 68.0 in | Wt 196.8 lb

## 2012-04-02 DIAGNOSIS — L0390 Cellulitis, unspecified: Secondary | ICD-10-CM

## 2012-04-02 DIAGNOSIS — L0291 Cutaneous abscess, unspecified: Secondary | ICD-10-CM

## 2012-04-02 DIAGNOSIS — I10 Essential (primary) hypertension: Secondary | ICD-10-CM

## 2012-04-02 MED ORDER — DOXYCYCLINE HYCLATE 100 MG PO CAPS: 100.0000 mg | ORAL_CAPSULE | Freq: Two times a day (BID) | ORAL | Status: AC

## 2012-04-02 NOTE — Telephone Encounter (Signed)
Called pt to inform him of his apt on 04/10/12 at 10:00. Pt is aware to arrive 15 minutes early to get through registration.

## 2012-04-02 NOTE — Patient Instructions (Signed)
Remove dressing in am and shower Cover with clean gauze daily after shower

## 2012-04-02 NOTE — Progress Notes (Signed)
  Subjective:    Patient ID: NUR KRASINSKI, male    DOB: Oct 18, 1961, 51 y.o.   MRN: [id number]  HPI Patient comes in today for SDA for  new problem evaluation. Onset yesterday of  Problem after  Scratching area on back of neck and had fluid come out and since then has gotten swollen and very painful  .   Peas sized ? Are had  been there for a while like a cyst ?  Mid line over neck  This has never happened before . Increasing  Lump and burning  and warmth and tenderness.   No fever or chills but malaise.   Painful over spine area  No other areas .  Review of Systems No NVD no cv sx  On bp meds doing well   Outpatient Prescriptions Prior to Visit  Medication Sig Dispense Refill  . lisinopril-hydrochlorothiazide (PRINZIDE,ZESTORETIC) 20-12.5 MG per tablet TAKE 1 TABLET BY MOUTH EVERY DAY FOR HIGH BLOOD PRESSURE  90 tablet  0  . Quercetin 250 MG TABS Take by mouth.        Marland Kitchen acetaminophen (TYLENOL) 325 MG tablet Take 650 mg by mouth every 6 (six) hours as needed.        . fluticasone (FLONASE) 50 MCG/ACT nasal spray Place 2 sprays into the nose daily.  16 g  12       Objective:   Physical Exam BP 108/80  Pulse 77  Temp(Src) 98.3 F (36.8 C) (Oral)  Wt 197 lb (89.359 kg)  SpO2 98% wdwn in nad  Neck small pustule inferiorly Red and warm  Area and indurated about 5-6 cm  Above this   Firm     And tender   ROM neck is supple and no adenopathy .     Assessment & Plan:  Abscess cellulitis   Neck  . Area over c spine   Area . Seems to have progressed rather rapidly by hx and large area over  Neck. Refer to surgery for  Poss I and d .  Has apt for 3 15 this pm.  Add antibiotic in the meantime.

## 2012-04-02 NOTE — Patient Instructions (Addendum)
Because of size and rapidiy and location advise surgical consult  To go 3 ;15  Today for [place] surgery.

## 2012-04-02 NOTE — Progress Notes (Signed)
Subjective:     Patient ID: Jonathan [name], male   DOB: Feb 07, 1961, 51 y.o.   MRN: [id number]  HPI We're asked to see the patient in consultation by Dr. Fabian Sharp to evaluate him for an abscess on the back of his neck. The patient is a 51 year old white male who has had a small lipoma back of his neck for quite some time. He was recently at [facility] when he reached up and grabbed the back of his neck and noticed that it was swollen and tender. He also noticed some drainage from that area. He denies any fevers. He saw his medical doctor who started him on doxycycline but he has not taken any of those pills yet.  Review of Systems  Constitutional: Negative.   HENT: Negative.   Eyes: Negative.   Respiratory: Negative.   Cardiovascular: Negative.   Gastrointestinal: Negative.   Genitourinary: Negative.   Musculoskeletal: Negative.   Skin: Negative.   Neurological: Negative.   Hematological: Negative.   Psychiatric/Behavioral: Negative.        Objective:   Physical Exam  Constitutional: He is oriented to person, place, and time. He appears well-developed and well-nourished.  HENT:  Head: Normocephalic and atraumatic.  Eyes: Conjunctivae and EOM are normal. Pupils are equal, round, and reactive to light.  Neck: Normal range of motion. Neck supple.       There is redness and a punctate opening on the back of his lower neck.  Cardiovascular: Normal rate, regular rhythm and normal heart sounds.   Pulmonary/Chest: Effort normal and breath sounds normal.  Abdominal: Soft. Bowel sounds are normal.  Musculoskeletal: Normal range of motion.  Neurological: He is alert and oriented to person, place, and time.  Skin: Skin is warm and dry.  Psychiatric: He has a normal mood and affect. His behavior is normal.       Assessment:     The patient has what appears to be an infected sebaceous cyst on his posterior neck area. The area was prepped with Betadine and infiltrated with 1% lidocaine with  epinephrine. The area was opened with an 11 blade knife. A small amount of purulence was evacuated. The rind of the cavity was removed as much as possible with a hemostat. The wound was then packed with gauze. He was instructed to remove the dressing in the morning and start showering. He will keep the area covered with Kling gauze and return in a week for a wound check     Plan:     Followup in one week for a wound check

## 2012-04-10 ENCOUNTER — Encounter (INDEPENDENT_AMBULATORY_CARE_PROVIDER_SITE_OTHER): Payer: Self-pay | Admitting: General Surgery

## 2012-04-10 ENCOUNTER — Ambulatory Visit (INDEPENDENT_AMBULATORY_CARE_PROVIDER_SITE_OTHER): Payer: BC Managed Care – PPO | Admitting: General Surgery

## 2012-04-10 VITALS — BP 128/82 | HR 68 | Temp 97.6°F | Resp 12 | Ht 68.0 in | Wt 195.0 lb

## 2012-04-10 DIAGNOSIS — L0390 Cellulitis, unspecified: Secondary | ICD-10-CM

## 2012-04-10 DIAGNOSIS — L0291 Cutaneous abscess, unspecified: Secondary | ICD-10-CM

## 2012-04-10 NOTE — Patient Instructions (Signed)
Remove dressing and shower daily

## 2012-04-10 NOTE — Progress Notes (Signed)
Subjective:     Patient ID: Jonathan [name], male   DOB: 10/30/1961, 51 y.o.   MRN: [id number]  HPI The patient is a 51 year old white male who is a week out from an incision and drainage of an infected sebaceous cyst on the back of his neck. He feels much better. He's had little to no drainage. It was sore initial informed that this is resolving slowly.  Review of Systems     Objective:   Physical Exam On exam the wound is clean with minimal drainage. I redressed the wound today and he tolerated it well. I instructed him on dressing changes again    Assessment:     Status post incision and drainage of infected sebaceous cyst on the neck    Plan:     He will continue to shower daily and change the dressing daily. We will plan to see him back in about 3 weeks for a wound check

## 2012-05-07 ENCOUNTER — Ambulatory Visit (INDEPENDENT_AMBULATORY_CARE_PROVIDER_SITE_OTHER): Payer: BC Managed Care – PPO | Admitting: General Surgery

## 2012-05-07 ENCOUNTER — Encounter (INDEPENDENT_AMBULATORY_CARE_PROVIDER_SITE_OTHER): Payer: Self-pay | Admitting: General Surgery

## 2012-05-07 VITALS — BP 164/102 | HR 78 | Temp 98.4°F | Ht 66.5 in | Wt 201.4 lb

## 2012-05-07 DIAGNOSIS — L0390 Cellulitis, unspecified: Secondary | ICD-10-CM

## 2012-05-07 DIAGNOSIS — L0291 Cutaneous abscess, unspecified: Secondary | ICD-10-CM

## 2012-05-07 NOTE — Progress Notes (Signed)
Subjective:     Patient ID: Jonathan [name], male   DOB: Nov 19, 1961, 51 y.o.   MRN: [id number]  HPI The patient is a 51 year old white male who is several weeks out from an incision and drainage of an infected sebaceous cyst on the back of his neck. He is doing well now. It seems as though the fatigue in the discomfort has resolved at this point. He denies any fevers.  Review of Systems     Objective:   Physical Exam On exam the incision is healing nicely. He does have some redness of the neck but this is all above the incision and seems to correspond to the color of his shirt. Because of this I would favor some exposure of her cellulitis since he is not having any pain.    Assessment:     Status post incision and drainage of sebaceous cyst on the back of the neck    Plan:     At this point I think he can return to his normal activities without any restrictions. We'll plan to see him back on a p.r.n. basis. He agrees to call me immediately if he starts having worsening pain or fever in the area

## 2012-05-07 NOTE — Patient Instructions (Signed)
Call if you have any problems 

## 2012-05-08 ENCOUNTER — Other Ambulatory Visit: Payer: Self-pay | Admitting: Internal Medicine

## 2012-10-20 ENCOUNTER — Other Ambulatory Visit: Payer: Self-pay | Admitting: Internal Medicine

## 2013-04-01 ENCOUNTER — Other Ambulatory Visit: Payer: Self-pay | Admitting: Internal Medicine

## 2013-04-04 NOTE — Telephone Encounter (Signed)
Left message on personally identified cell for the pt to call the office and make an appt for this to be filled.

## 2013-04-09 ENCOUNTER — Ambulatory Visit (INDEPENDENT_AMBULATORY_CARE_PROVIDER_SITE_OTHER): Payer: BC Managed Care – PPO | Admitting: Internal Medicine

## 2013-04-09 ENCOUNTER — Encounter: Payer: Self-pay | Admitting: Internal Medicine

## 2013-04-09 VITALS — BP 132/84 | HR 81 | Temp 97.9°F | Wt 197.0 lb

## 2013-04-09 DIAGNOSIS — I10 Essential (primary) hypertension: Secondary | ICD-10-CM

## 2013-04-09 DIAGNOSIS — J309 Allergic rhinitis, unspecified: Secondary | ICD-10-CM

## 2013-04-09 MED ORDER — LISINOPRIL-HYDROCHLOROTHIAZIDE 20-12.5 MG PO TABS: ORAL_TABLET | ORAL | Status: DC

## 2013-04-09 NOTE — Progress Notes (Signed)
Chief Complaint  Patient presents with  . Follow-up    Med check    HPI: Last ov over a year ago . Due for med refill bp doing ok no se of med  Has ocass cough felt from allergy drainage . No cv sx  However has feeling like faint if coughs very hard  Happens about 2-3 x per year .  No other sx noexericse sx .  Seeing chiro  For right leg lat burning getting better .  ROS: See pertinent positives and negatives per HPI. Now wowrking full time  Past Medical History  Diagnosis Date  . Allergic rhinitis   . Anxiety   . Depression   . Hyperlipidemia   . Hypertension   . History of nephrolithiasis   . Sebaceous cyst     Family History  Problem Relation Age of Onset  . Diabetes Father   . Hypertension Father   . Kidney cancer Father     now on dialysis  . Cancer Father     kidney    History   Social History  . Marital Status: Married    Spouse Name: N/A    Number of Children: N/A  . Years of Education: N/A   Social History Main Topics  . Smoking status: Never Smoker   . Smokeless tobacco: Never Used  . Alcohol Use: Yes     Comment: less than 1 a week  . Drug Use: No  . Sexually Active: None   Other Topics Concern  . None   Social History Narrative   Married   Regular exercise- no   HH of 3   Dog car and lizard   Sleep wakening at times    Outpatient Encounter Prescriptions as of 04/09/2013  Medication Sig Dispense Refill  . acetaminophen (TYLENOL) 325 MG tablet Take 650 mg by mouth every 6 (six) hours as needed.        . Ascorbic Acid (VITAMIN C) 100 MG tablet Take 100 mg by mouth daily.      Marland Kitchen lisinopril-hydrochlorothiazide (PRINZIDE,ZESTORETIC) 20-12.5 MG per tablet TAKE 1 TABLET BY MOUTH EVERY DAY FOR HIGH BLOOD PRESSURE  90 tablet  2  . Quercetin 250 MG TABS Take by mouth.        . [DISCONTINUED] lisinopril-hydrochlorothiazide (PRINZIDE,ZESTORETIC) 20-12.5 MG per tablet TAKE 1 TABLET BY MOUTH EVERY DAY FOR HIGH BLOOD PRESSURE  30 tablet  0  .  [DISCONTINUED] fluticasone (FLONASE) 50 MCG/ACT nasal spray Place 2 sprays into the nose daily.  16 g  12   No facility-administered encounter medications on file as of 04/09/2013.    EXAM:  BP 132/84  Pulse 81  Temp(Src) 97.9 F (36.6 C) (Oral)  Wt 197 lb (89.359 kg)  BMI 31.32 kg/m2  SpO2 96%  Body mass index is 31.32 kg/(m^2).  GENERAL: vitals reviewed and listed above, alert, oriented, appears well hydrated and in no acute distress  HEENT: atraumatic, conjunctiva  clear, no obvious abnormalities on inspection of external nose and ears   NECK: no obvious masses on inspection palpation  No adenopathy  LUNGS: clear to auscultation bilaterally, no wheezes, rales or rhonchi, good air movement  CV: HRRR, no clubbing cyanosis or  peripheral edema nl cap refill   MS: moves all extremities without noticeable focal  abnormality  PSYCH: pleasant and cooperative, no obvious depression or anxiety Lab Results  Component Value Date   WBC 9.2 03/23/2010   HGB 17.0 03/23/2010   HCT 49.5 03/23/2010  PLT 265.0 03/23/2010   GLUCOSE 96 03/23/2010   CHOL 262* 03/23/2010   TRIG 155.0* 03/23/2010   HDL 52.40 03/23/2010   LDLDIRECT 192.0 03/23/2010   ALT 31 03/23/2010   AST 22 03/23/2010   NA 143 03/23/2010   K 4.1 03/23/2010   CL 108 03/23/2010   CREATININE 1.1 03/23/2010   BUN 16 03/23/2010   CO2 27 03/23/2010   TSH 0.87 03/23/2010   PSA 0.82 03/23/2010    ASSESSMENT AND PLAN:  Discussed the following assessment and plan:  HYPERTENSION - controlled due for lab monitoring  ALLERGIC RHINITIS i am still not convinced that acei could  add to cough but pt says ok inbetween issues  Refill med  Get labs earlier   Pt and i agree that he had labs  Since 2011  But noi in th flow record    Get labs earlier this year .   ROv in fall  -Patient advised to return or notify health care team  if symptoms worsen or persist or new concerns arise.  Patient Instructions  Continue medication and come back for  preventive visit in the fall  However it has been a while since you had lab monitoring so would rather have this done earlier .    Neta Mends. Luisenrique Conran M.D. Health Maintenance  Topic Date Due  . Influenza Vaccine  08/05/2013  . Tetanus/tdap  03/31/2020  . Colonoscopy  07/05/2020   Health Maintenance Review

## 2013-04-09 NOTE — Patient Instructions (Signed)
Continue medication and come back for preventive visit in the fall  However it has been a while since you had lab monitoring so would rather have this done earlier .

## 2013-04-26 ENCOUNTER — Other Ambulatory Visit (INDEPENDENT_AMBULATORY_CARE_PROVIDER_SITE_OTHER): Payer: BC Managed Care – PPO

## 2013-04-26 DIAGNOSIS — Z0000 Encounter for general adult medical examination without abnormal findings: Secondary | ICD-10-CM

## 2013-04-26 LAB — BASIC METABOLIC PANEL
CO2: 28 mEq/L (ref 19–32)
GFR: 71.23 mL/min (ref >60.00)
Glucose, Bld: 101 mg/dL — ABNORMAL HIGH (ref 70–99)
Potassium: 4.4 mEq/L (ref 3.5–5.1)
Sodium: 139 mEq/L (ref 135–145)

## 2013-04-26 LAB — CBC WITH DIFFERENTIAL/PLATELET
Eosinophils Relative: 2.8 % (ref 0.0–5.0)
HCT: 48.3 % (ref 39.0–52.0)
Hemoglobin: 16.5 g/dL (ref 13.0–17.0)
Lymphs Abs: 3.5 10*3/uL (ref 0.7–4.0)
Monocytes Relative: 8.7 % (ref 3.0–12.0)
Neutro Abs: 5.4 10*3/uL (ref 1.4–7.7)
RBC: 5.38 Mil/uL (ref 4.22–5.81)

## 2013-04-26 LAB — HEPATIC FUNCTION PANEL
ALT: 33 U/L (ref 0–53)
AST: 22 U/L (ref 0–37)
Total Bilirubin: 0.4 mg/dL (ref 0.3–1.2)

## 2013-04-26 LAB — LIPID PANEL
HDL: 41.7 mg/dL (ref >39.00)
Total CHOL/HDL Ratio: 6

## 2013-04-26 LAB — LDL CHOLESTEROL, DIRECT: Direct LDL: 163 mg/dL

## 2013-04-26 LAB — TSH: TSH: 0.84 u[IU]/mL (ref 0.35–5.50)

## 2013-04-29 ENCOUNTER — Ambulatory Visit (INDEPENDENT_AMBULATORY_CARE_PROVIDER_SITE_OTHER): Payer: BC Managed Care – PPO | Admitting: Family Medicine

## 2013-04-29 VITALS — BP 138/86 | HR 72 | Temp 97.7°F | Resp 16 | Ht 68.0 in | Wt 203.0 lb

## 2013-04-29 DIAGNOSIS — R195 Other fecal abnormalities: Secondary | ICD-10-CM

## 2013-04-29 DIAGNOSIS — R300 Dysuria: Secondary | ICD-10-CM

## 2013-04-29 LAB — POCT URINALYSIS DIPSTICK
Bilirubin, UA: NEGATIVE
Blood, UA: NEGATIVE
Glucose, UA: NEGATIVE
Leukocytes, UA: NEGATIVE
Nitrite, UA: NEGATIVE

## 2013-04-29 LAB — IFOBT (OCCULT BLOOD): IFOBT: POSITIVE

## 2013-04-29 LAB — POCT UA - MICROSCOPIC ONLY
Mucus, UA: NEGATIVE
WBC, Ur, HPF, POC: NEGATIVE
Yeast, UA: NEGATIVE

## 2013-04-29 LAB — POCT CBC
HCT, POC: 51.5 % (ref 43.5–53.7)
Hemoglobin: 17 g/dL (ref 14.1–18.1)
Lymph, poc: 2.9 (ref 0.6–3.4)
MCH, POC: 31.1 pg (ref 27–31.2)
MCHC: 33 g/dL (ref 31.8–35.4)
MCV: 94.2 fL (ref 80–97)
RBC: 5.47 M/uL (ref 4.69–6.13)
WBC: 10.6 10*3/uL — AB (ref 4.6–10.2)

## 2013-04-29 MED ORDER — CIPROFLOXACIN HCL 500 MG PO TABS: 500.0000 mg | ORAL_TABLET | Freq: Two times a day (BID) | ORAL | Status: DC

## 2013-04-29 NOTE — Progress Notes (Signed)
[facility] [address] [place] [address] [phone]  Date:  04/29/2013   Name:  Jonathan [name]   DOB:  Sep 01, 1961   MRN:  [id number]  PCP:  Lorretta Harp, MD    Chief Complaint: Dysuria   History of Present Illness:  Jonathan [name] is a 52 y.o. very pleasant male patient who presents with the following:  He work up around 0200 with a "ticking" feeling like he needed to urinate.  He recetnly had a sinus infection, and has had nephrolithiasis in the past. He does not have any back pain that reminds him of a stone today.   He has had a UTI in the past- he thinks it was several years ago.   No hematuria, no real dysuria, but he feels "discomfort" with urination.  No penile discharge.  He does not note any nausea, vomiting or diarrhea.  He has not noted any blood in his stools or dark, tarry stools.  Colonoscopy was about 3 years ago and normal  No new sexual partners, he does not feel there is any STI risk  He did have an episode of prostatitis in college, and this kind of reminds him of that time.   Patient Active Problem List   Diagnosis Date Noted  . Abscess and cellulitis 04/02/2012  . Pneumonitis 05/06/2011  . Cough 05/06/2011  . SKIN LESION, UNCERTAIN SIGNIFICANCE 03/31/2010  . FATIGUE 04/15/2009  . ACUTE BRONCHITIS 03/26/2009  . BACK PAIN, LEFT 09/25/2008  . UNS ADVRS EFF OTH RX MEDICINAL&BIOLOGICAL SBSTNC 03/10/2008  . NEPHROLITHIASIS, HX OF 02/10/2008  . HYPERLIPIDEMIA 02/06/2008  . ANXIETY 02/06/2008  . DEPRESSION 02/06/2008  . ATTENTION DEFICIT DISORDER, ADULT 02/06/2008  . HYPERTENSION 02/06/2008  . ALLERGIC RHINITIS 02/06/2008  . HEADACHE 02/06/2008    Past Medical History  Diagnosis Date  . Allergic rhinitis   . Anxiety   . Depression   . Hyperlipidemia   . Hypertension   . History of nephrolithiasis   . Sebaceous cyst     Past Surgical History  Procedure Laterality Date  . Tonsillectomy      x 2  .  Tympanostomy tube placement    . Kidney stones removed      x 2    History  Substance Use Topics  . Smoking status: Never Smoker   . Smokeless tobacco: Never Used  . Alcohol Use: Yes     Comment: less than 1 a week    Family History  Problem Relation Age of Onset  . Diabetes Father   . Hypertension Father   . Kidney cancer Father     now on dialysis  . Cancer Father     kidney    Allergies  Allergen Reactions  . Zocor (Simvastatin)     ? myalgias  . Penicillins Hives    Per pt - happened in childhood - hives possible reaction    Medication list has been reviewed and updated.  Current Outpatient Prescriptions on File Prior to Visit  Medication Sig Dispense Refill  . acetaminophen (TYLENOL) 325 MG tablet Take 650 mg by mouth every 6 (six) hours as needed.        . Ascorbic Acid (VITAMIN C) 100 MG tablet Take 100 mg by mouth daily.      Marland Kitchen lisinopril-hydrochlorothiazide (PRINZIDE,ZESTORETIC) 20-12.5 MG per tablet TAKE 1 TABLET BY MOUTH EVERY DAY FOR HIGH BLOOD PRESSURE  90 tablet  2  . Quercetin 250 MG TABS Take by  mouth.         No current facility-administered medications on file prior to visit.    Review of Systems:  As per HPI- otherwise negative.   Physical Examination: Filed Vitals:   04/29/13 0834  BP: 138/86  Pulse: 72  Temp: 97.7 F (36.5 C)  Resp: 16   Filed Vitals:   04/29/13 0834  Height: 5\' 8"  (1.727 m)  Weight: 203 lb (92.08 kg)   Body mass index is 30.87 kg/(m^2). Ideal Body Weight: Weight in (lb) to have BMI = 25: 164.1 GEN: WDWN, NAD, Non-toxic, A & O x 3 HEENT: Atraumatic, Normocephalic. Neck supple. No masses, No LAD. Ears and Nose: No external deformity. CV: RRR, No M/G/R. No JVD. No thrill. No extra heart sounds. PULM: CTA B, no wheezes, crackles, rhonchi. No retractions. No resp. distress. No accessory muscle use. ABD: S, NT, ND, +BS. No rebound. No HSM. EXTR: No c/c/e NEURO Normal gait.  PSYCH: Normally interactive.  Conversant. Not depressed or anxious appearing.  Calm demeanor.  Rectal: normal exam.  Prostate is non- tender GU: normal, no penile tenderness or discharge.  He notes slight tenderness in the interior pole of the right testicle, but certainly nothing to suggest torsion.  Testicles are normal in size, no elevation or swelling, no heat.    Results for orders placed in visit on 04/29/13  POCT UA - MICROSCOPIC ONLY      Result Value Range   WBC, Ur, HPF, POC neg     RBC, urine, microscopic neg     Bacteria, U Microscopic neg     Mucus, UA neg     Epithelial cells, urine per micros 0-2     Crystals, Ur, HPF, POC neg     Casts, Ur, LPF, POC neg     Yeast, UA neg    POCT URINALYSIS DIPSTICK      Result Value Range   Color, UA yellow     Clarity, UA clear     Glucose, UA neg     Bilirubin, UA neg     Ketones, UA neg     Spec Grav, UA 1.010     Blood, UA neg     pH, UA 6.5     Protein, UA neg     Urobilinogen, UA 0.2     Nitrite, UA neg     Leukocytes, UA Negative    IFOBT (OCCULT BLOOD)      Result Value Range   IFOBT Positive    POCT CBC      Result Value Range   WBC 10.6 (*) 4.6 - 10.2 K/uL   Lymph, poc 2.9  0.6 - 3.4   POC LYMPH PERCENT 27.0  10 - 50 %L   MID (cbc) 0.9  0 - 0.9   POC MID % 8.4  0 - 12 %M   POC Granulocyte 6.8  2 - 6.9   Granulocyte percent 64.6  37 - 80 %G   RBC 5.47  4.69 - 6.13 M/uL   Hemoglobin 17.0  14.1 - 18.1 g/dL   HCT, POC 96.0  45.4 - 53.7 %   MCV 94.2  80 - 97 fL   MCH, POC 31.1  27 - 31.2 pg   MCHC 33.0  31.8 - 35.4 g/dL   RDW, POC 09.8     Platelet Count, POC 324  142 - 424 K/uL   MPV 8.3  0 - 99.8 fL    Assessment and Plan: Dysuria -  Plan: POCT UA - Microscopic Only, POCT urinalysis dipstick, IFOBT POC (occult bld, rslt in office), POCT CBC, Urine culture, ciprofloxacin (CIPRO) 500 MG tablet  Heme positive stool  Treat for prostatitis while awaiting other labs.   Plan follow- up with GI for heme positive stool. If any worsening he is  to seek care right away.  He has not noted any blood in stool or dark tarry stools  Signed Abbe Amsterdam, MD

## 2013-04-29 NOTE — Patient Instructions (Addendum)
Please call Dr. Marzetta Board office and arrange a follow-up appt in the next few weeks.   [facility] Address: [address] [place], [place], [place] [address] [phone]  If you have any problems or any other GI symptoms please give [place] a call.  We will be in touch regarding your urine culture in the next couple of days

## 2013-04-30 ENCOUNTER — Telehealth: Payer: Self-pay | Admitting: Family Medicine

## 2013-04-30 NOTE — Telephone Encounter (Signed)
This was sent to the results that I print for office visits but this patient isn't due to be seen until Sept.  What would you have me tell the patient about his lab work?

## 2013-04-30 NOTE — Telephone Encounter (Signed)
Tell patient results    Potassium ok lipids some better   . Can send him a copy  .

## 2013-05-02 ENCOUNTER — Encounter: Payer: Self-pay | Admitting: Family Medicine

## 2013-05-02 ENCOUNTER — Telehealth: Payer: Self-pay | Admitting: Family Medicine

## 2013-05-02 NOTE — Telephone Encounter (Signed)
Information left on personally identified home/cell.

## 2013-05-02 NOTE — Telephone Encounter (Signed)
Called and LMOM- urine culture negative so may well have been a prostate infection.  Please let us know if not better

## 2013-05-06 ENCOUNTER — Ambulatory Visit: Payer: BC Managed Care – PPO

## 2013-05-06 ENCOUNTER — Ambulatory Visit (INDEPENDENT_AMBULATORY_CARE_PROVIDER_SITE_OTHER): Payer: BC Managed Care – PPO | Admitting: Internal Medicine

## 2013-05-06 VITALS — BP 127/80 | HR 68 | Temp 97.8°F | Resp 18 | Ht 67.0 in | Wt 202.2 lb

## 2013-05-06 DIAGNOSIS — R109 Unspecified abdominal pain: Secondary | ICD-10-CM

## 2013-05-06 DIAGNOSIS — Z6831 Body mass index (BMI) 31.0-31.9, adult: Secondary | ICD-10-CM

## 2013-05-06 DIAGNOSIS — N4281 Prostatodynia syndrome: Secondary | ICD-10-CM

## 2013-05-06 DIAGNOSIS — N4289 Other specified disorders of prostate: Secondary | ICD-10-CM

## 2013-05-06 LAB — POCT URINALYSIS DIPSTICK
Blood, UA: NEGATIVE
Ketones, UA: NEGATIVE
Protein, UA: NEGATIVE
Spec Grav, UA: >=1.03
pH, UA: 5.5

## 2013-05-06 LAB — POCT UA - MICROSCOPIC ONLY
Bacteria, U Microscopic: NEGATIVE
Casts, Ur, LPF, POC: NEGATIVE
Crystals, Ur, HPF, POC: NEGATIVE
Mucus, UA: NEGATIVE
RBC, urine, microscopic: NEGATIVE

## 2013-05-06 LAB — IFOBT (OCCULT BLOOD): IFOBT: NEGATIVE

## 2013-05-06 MED ORDER — POLYETHYLENE GLYCOL 3350 17 GM/SCOOP PO POWD: 17.0000 g | Freq: Two times a day (BID) | ORAL | Status: DC

## 2013-05-06 MED ORDER — MELOXICAM 15 MG PO TABS: 15.0000 mg | ORAL_TABLET | Freq: Every day | ORAL | Status: DC

## 2013-05-06 NOTE — Progress Notes (Signed)
  Subjective:    Patient ID: Jonathan [name], male    DOB: 1961-04-19, 52 y.o.   MRN: [id number]  HPIsaw Dr Copland 1 week ago and started cipro for prostatitis///no improvement and labs unhelpful Continues with several abdominal pains- 1)RUQ shap pain w/ coughing 2)suprapub pressure pain thruout day and night that radiates to the tip of the penis 3)umbilical tenderness to touch 4)discomfort of bloating/distension Denies diarrhea or constipation but has had a change in stool diameter to be much smaller Heme-positive stool when here last week/no melena or hematochezia since No hesitation, change in flow, change in frequency, but has felt an increased volume No dysuria or hematuria Loss of appetite but no wt loss No postpran distress  Note recent labs  Review of Systems No fever chills or night sweats No cough or shortness of breath Nausea or vomiting    Objective:   Physical Exam BP 127/80  Pulse 68  Temp(Src) 97.8 F (36.6 C) (Oral)  Resp 18  Ht 5\' 7"  (1.702 m)  Wt 202 lb 3.2 oz (91.717 kg)  BMI 31.66 kg/m2  SpO2 97% No acute distress HEENT clear Heart regular without murmur Abdomen obese/not distended/bowel sounds present No organomegaly or masses Small umbilical hernia with tenderness in the umbilicus but no herniation Tendon palpation in both lower quadrants No rebound tenderness Tender suprapubic Prostate symmetrical soft and very tender but not enlarged Heme  Negative brown stool Testicles nontender and not enlarged     [facility] reading (PRIMARY) by  Dr.Ersel Enslin=NAD//lots of stool//radiology notes stones in both kidneys   Results for orders placed in visit on 05/06/13  POCT UA - MICROSCOPIC ONLY      Result Value Range   WBC, Ur, HPF, POC 0-1     RBC, urine, microscopic neg     Bacteria, U Microscopic neg     Mucus, UA neg     Epithelial cells, urine per micros 0-1     Crystals, Ur, HPF, POC neg     Casts, Ur, LPF, POC neg     Yeast, UA neg    POCT  URINALYSIS DIPSTICK      Result Value Range   Color, UA yellow     Clarity, UA clear     Glucose, UA neg     Bilirubin, UA neg     Ketones, UA neg     Spec Grav, UA >=1.030     Blood, UA neg     pH, UA 5.5     Protein, UA neg     Urobilinogen, UA 0.2     Nitrite, UA neg     Leukocytes, UA Negative    IFOBT (OCCULT BLOOD)      Result Value Range   IFOBT Negative       Assessment & Plan:  Prolonged abdominal pain with somewhat tender prostate/prostatodynia Small umbilical hernia  Finish Cipro Meloxicam 15 mg daily MiraLax 17 g twice a day Recheck one week/sooner if worse Abdominal imaging if no improvement

## 2013-05-07 DIAGNOSIS — Z6831 Body mass index (BMI) 31.0-31.9, adult: Secondary | ICD-10-CM | POA: Insufficient documentation

## 2013-05-25 ENCOUNTER — Ambulatory Visit (INDEPENDENT_AMBULATORY_CARE_PROVIDER_SITE_OTHER): Payer: BC Managed Care – PPO | Admitting: Family Medicine

## 2013-05-25 ENCOUNTER — Ambulatory Visit: Payer: BC Managed Care – PPO

## 2013-05-25 VITALS — BP 125/90 | HR 80 | Temp 97.8°F | Resp 18 | Wt 204.0 lb

## 2013-05-25 DIAGNOSIS — B356 Tinea cruris: Secondary | ICD-10-CM

## 2013-05-25 MED ORDER — KETOCONAZOLE 2 % EX CREA: TOPICAL_CREAM | Freq: Two times a day (BID) | CUTANEOUS | Status: DC

## 2013-05-25 NOTE — Patient Instructions (Signed)
Fungal infection secondary to having taken antibiotics and living in a hot, humid climate.

## 2013-05-25 NOTE — Progress Notes (Signed)
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 2 weeks ago he had abdominal pain and took Cipro. The last several days she's developed a rash under his arms which is red, burning and associated with skin tag explosion. No rash in groin  Objective: Bilateral confluent erythematous rash in both axillae  Tinea cruris - Plan: ketoconazole (NIZORAL) 2 % cream  Signed, Elvina Sidle, MD

## 2013-06-04 ENCOUNTER — Encounter: Payer: Self-pay | Admitting: Family Medicine

## 2013-06-04 ENCOUNTER — Ambulatory Visit (INDEPENDENT_AMBULATORY_CARE_PROVIDER_SITE_OTHER): Payer: BC Managed Care – PPO | Admitting: Family Medicine

## 2013-06-04 VITALS — BP 126/81 | HR 75 | Temp 98.0°F | Resp 18 | Ht 68.0 in | Wt 202.0 lb

## 2013-06-04 DIAGNOSIS — B356 Tinea cruris: Secondary | ICD-10-CM

## 2013-06-04 MED ORDER — KETOCONAZOLE 200 MG PO TABS: 200.0000 mg | ORAL_TABLET | Freq: Every day | ORAL | Status: DC

## 2013-06-04 NOTE — Progress Notes (Signed)
52 year old gentleman comes back because of axillary rash. He was seen 10 days ago and started on ketoconazole cream which had a immediate and good response. Nevertheless he missed a dose or 2 and then when he restarted the rash does not seem to respond well. Last night it started weeping again and is very uncomfortable.  Objective: Confluent erythematous thickening of skin in both axilla  Assessment: Tinea cruris  Plan: Tinea cruris - Plan: ketoconazole (NIZORAL) 200 MG tablet  Signed, Elvina Sidle, MD

## 2013-06-06 ENCOUNTER — Telehealth: Payer: Self-pay

## 2013-06-06 DIAGNOSIS — R21 Rash and other nonspecific skin eruption: Secondary | ICD-10-CM

## 2013-06-06 MED ORDER — FLUCONAZOLE 150 MG PO TABS: 150.0000 mg | ORAL_TABLET | Freq: Once | ORAL | Status: DC

## 2013-06-06 NOTE — Telephone Encounter (Signed)
PATIENT WOULD LIKE A NURSE TO CALL HIM HE IS NOT FEELING MUCH BETTER PLEASE CALL HIM AT [phone]

## 2013-06-06 NOTE — Telephone Encounter (Signed)
I want patient to switch to diflucan 150 daily x 5 days.  Rx has been called in

## 2013-06-06 NOTE — Telephone Encounter (Signed)
States area on his axilla is not any better, what is next?

## 2013-06-06 NOTE — Telephone Encounter (Signed)
Patient advised.

## 2013-07-02 ENCOUNTER — Ambulatory Visit (INDEPENDENT_AMBULATORY_CARE_PROVIDER_SITE_OTHER): Payer: BC Managed Care – PPO | Admitting: Family Medicine

## 2013-07-02 VITALS — BP 140/88 | HR 67 | Temp 97.6°F | Resp 18 | Ht 67.5 in | Wt 207.4 lb

## 2013-07-02 DIAGNOSIS — R140 Abdominal distension (gaseous): Secondary | ICD-10-CM

## 2013-07-02 DIAGNOSIS — R141 Gas pain: Secondary | ICD-10-CM

## 2013-07-02 DIAGNOSIS — L259 Unspecified contact dermatitis, unspecified cause: Secondary | ICD-10-CM

## 2013-07-02 DIAGNOSIS — R21 Rash and other nonspecific skin eruption: Secondary | ICD-10-CM

## 2013-07-02 MED ORDER — CLOTRIMAZOLE-BETAMETHASONE 1-0.05 % EX CREA: TOPICAL_CREAM | Freq: Two times a day (BID) | CUTANEOUS | Status: DC

## 2013-07-02 NOTE — Patient Instructions (Addendum)
Discard current deodorant, and avoid deodorant for next 1 week, then can try [facility] for men deodorant only.  Cream as prescribed, and if not improving in next week - recheck.  Return to the clinic or go to the nearest emergency room if any of your symptoms worsen or new symptoms occur. If your abdominal symptoms do not continue to improve - we can refer you to a gastroenterologist.  Contact Dermatitis Contact dermatitis is a reaction to certain substances that touch the skin. Contact dermatitis can be either irritant contact dermatitis or allergic contact dermatitis. Irritant contact dermatitis does not require previous exposure to the substance for a reaction to occur.Allergic contact dermatitis only occurs if you have been exposed to the substance before. Upon a repeat exposure, your body reacts to the substance.  CAUSES  Many substances can cause contact dermatitis. Irritant dermatitis is most commonly caused by repeated exposure to mildly irritating substances, such as:  Makeup.  Soaps.  Detergents.  Bleaches.  Acids.  Metal salts, such as nickel. Allergic contact dermatitis is most commonly caused by exposure to:  Poisonous plants.  Chemicals (deodorants, shampoos).  Jewelry.  Latex.  Neomycin in triple antibiotic cream.  Preservatives in products, including clothing. SYMPTOMS  The area of skin that is exposed may develop:  Dryness or flaking.  Redness.  Cracks.  Itching.  Pain or a burning sensation.  Blisters. With allergic contact dermatitis, there may also be swelling in areas such as the eyelids, mouth, or genitals.  DIAGNOSIS  Your caregiver can usually tell what the problem is by doing a physical exam. In cases where the cause is uncertain and an allergic contact dermatitis is suspected, a patch skin test may be performed to help determine the cause of your dermatitis. TREATMENT Treatment includes protecting the skin from further contact with the irritating  substance by avoiding that substance if possible. Barrier creams, powders, and gloves may be helpful. Your caregiver may also recommend:  Steroid creams or ointments applied 2 times daily. For best results, soak the rash area in cool water for 20 minutes. Then apply the medicine. Cover the area with a plastic wrap. You can store the steroid cream in the refrigerator for a "chilly" effect on your rash. That may decrease itching. Oral steroid medicines may be needed in more severe cases.  Antibiotics or antibacterial ointments if a skin infection is present.  Antihistamine lotion or an antihistamine taken by mouth to ease itching.  Lubricants to keep moisture in your skin.  Burow's solution to reduce redness and soreness or to dry a weeping rash. Mix one packet or tablet of solution in 2 cups cool water. Dip a clean washcloth in the mixture, wring it out a bit, and put it on the affected area. Leave the cloth in place for 30 minutes. Do this as often as possible throughout the day.  Taking several cornstarch or baking soda baths daily if the area is too large to cover with a washcloth. Harsh chemicals, such as alkalis or acids, can cause skin damage that is like a burn. You should flush your skin for 15 to 20 minutes with cold water after such an exposure. You should also seek immediate medical care after exposure. Bandages (dressings), antibiotics, and pain medicine may be needed for severely irritated skin.  HOME CARE INSTRUCTIONS  Avoid the substance that caused your reaction.  Keep the area of skin that is affected away from hot water, soap, sunlight, chemicals, acidic substances, or anything else that  would irritate your skin.  Do not scratch the rash. Scratching may cause the rash to become infected.  You may take cool baths to help stop the itching.  Only take over-the-counter or prescription medicines as directed by your caregiver.  See your caregiver for follow-up care as directed to  make sure your skin is healing properly. SEEK MEDICAL CARE IF:   Your condition is not better after 3 days of treatment.  You seem to be getting worse.  You see signs of infection such as swelling, tenderness, redness, soreness, or warmth in the affected area.  You have any problems related to your medicines. Document Released: 11/18/2000 Document Revised: 02/13/2012 Document Reviewed: 04/26/2011 [facility] Patient Information 2014 [place], [place].

## 2013-07-02 NOTE — Progress Notes (Signed)
Subjective:    Patient ID: Jonathan [name], male    DOB: 1961-05-23, 52 y.o.   MRN: [id number]  HPI Jonathan [name] is a 52 y.o. male PCP: [facility]. Physical scheduled September.   See prior ov's - seen for abd pain, possible prostatis/prostadynia -  May and June, then seen by Dr. Milus Glazier. ? Intestinal blockage or buildup caused irritation of prostate. Took Cipro 500mg  BID for 10 days. No real improvement. Improved after follow up with Dr. Merla Riches - Rx Mobic and Miralax - felt better in a day or two. No abd pain now. bm's more regular, no blood in stool. Stool flat at times. Did have normal colonoscopy at 52 yo, plan on recheck in 10 years. No fever, no weight loss, urinating normally. Would like to continue to watch symptoms.   Rash under arms. See prior ov's. Noticed around mid June.  No new deodorant. Weeping feel with redness. Deep red to almost brown/scaling sx's. Initial tx: ketoconazole cream BID - min relief. Added Nizoral oral - min relief, then changed to Diflucan 150mg  po QD for 5 days. Some improvement on this - almost normal. Had been avoiding any deodorant, until yesterday - applied deodorant yesterday am - (deoodrant only, not antiperspirant, and same one for 2 years). By mid day more redness, clawing at area d/t itching.  More red today.  Has not used deodorant today.    Review of Systems  Constitutional: Negative for fever and chills.  HENT: Negative for sore throat, mouth sores and trouble swallowing.   Respiratory: Negative for chest tightness and shortness of breath.   Gastrointestinal: Positive for abdominal distention (feels a full feeling. ). Negative for abdominal pain.  Skin: Positive for color change and rash.       Objective:   Physical Exam  Vitals reviewed. Constitutional: He is oriented to person, place, and time. He appears well-developed and well-nourished. No distress.  HENT:  Head: Normocephalic and atraumatic.  Pulmonary/Chest: Effort normal.    Abdominal: Soft. Normal appearance. There is no tenderness. There is no rebound and no guarding.  Protuberant but nt.   Neurological: He is alert and oriented to person, place, and time.  Skin: Skin is warm, dry and intact. Rash noted. No petechiae noted. Rash is not vesicular. There is erythema.      Woods lamp of axilla - no fluorescence, but few white areas noted in folds.  Results for orders placed in visit on 07/02/13  POCT SKIN KOH      Result Value Range   Skin KOH, POC Negative     2nd MD exam of rash.      Assessment & Plan:  Jonathan [name] is a 52 y.o. male  Rash and nonspecific skin eruption - Plan: POCT Skin KOH, clotrimazole-betamethasone (LOTRISONE) cream  Contact dermatitis - Plan: clotrimazole-betamethasone (LOTRISONE) cream  Abdominal bloating   Axillary erythema/rash - likely contact derm with recurrence reusing deodorant, but possible tinea component.  Doubt erythrasma or inverse psoriasis. Discard deodorant, other care as below. Start lotrisone BID, and then can restart mild deodorant only in 1 week if sx's improving. Consider derm eval if not improving.   Abd pain - ? Prostatitis prior vs functional vs constipation - improved now.  Discussed GI eval, but would like to continue to watch sx's for now as improving. rtc precautions, continue fiber in diet, miralax if needed.    Meds ordered this encounter  Medications  . clotrimazole-betamethasone (LOTRISONE) cream    Sig: Apply  topically 2 (two) times daily.    Dispense:  30 g    Refill:  0   Patient Instructions  Discard current deodorant, and avoid deodorant for next 1 week, then can try [facility] for men deodorant only.  Cream as prescribed, and if not improving in next week - recheck.  Return to the clinic or go to the nearest emergency room if any of your symptoms worsen or new symptoms occur. If your abdominal symptoms do not continue to improve - we can refer you to a gastroenterologist.  Contact  Dermatitis Contact dermatitis is a reaction to certain substances that touch the skin. Contact dermatitis can be either irritant contact dermatitis or allergic contact dermatitis. Irritant contact dermatitis does not require previous exposure to the substance for a reaction to occur.Allergic contact dermatitis only occurs if you have been exposed to the substance before. Upon a repeat exposure, your body reacts to the substance.  CAUSES  Many substances can cause contact dermatitis. Irritant dermatitis is most commonly caused by repeated exposure to mildly irritating substances, such as:  Makeup.  Soaps.  Detergents.  Bleaches.  Acids.  Metal salts, such as nickel. Allergic contact dermatitis is most commonly caused by exposure to:  Poisonous plants.  Chemicals (deodorants, shampoos).  Jewelry.  Latex.  Neomycin in triple antibiotic cream.  Preservatives in products, including clothing. SYMPTOMS  The area of skin that is exposed may develop:  Dryness or flaking.  Redness.  Cracks.  Itching.  Pain or a burning sensation.  Blisters. With allergic contact dermatitis, there may also be swelling in areas such as the eyelids, mouth, or genitals.  DIAGNOSIS  Your caregiver can usually tell what the problem is by doing a physical exam. In cases where the cause is uncertain and an allergic contact dermatitis is suspected, a patch skin test may be performed to help determine the cause of your dermatitis. TREATMENT Treatment includes protecting the skin from further contact with the irritating substance by avoiding that substance if possible. Barrier creams, powders, and gloves may be helpful. Your caregiver may also recommend:  Steroid creams or ointments applied 2 times daily. For best results, soak the rash area in cool water for 20 minutes. Then apply the medicine. Cover the area with a plastic wrap. You can store the steroid cream in the refrigerator for a "chilly" effect  on your rash. That may decrease itching. Oral steroid medicines may be needed in more severe cases.  Antibiotics or antibacterial ointments if a skin infection is present.  Antihistamine lotion or an antihistamine taken by mouth to ease itching.  Lubricants to keep moisture in your skin.  Burow's solution to reduce redness and soreness or to dry a weeping rash. Mix one packet or tablet of solution in 2 cups cool water. Dip a clean washcloth in the mixture, wring it out a bit, and put it on the affected area. Leave the cloth in place for 30 minutes. Do this as often as possible throughout the day.  Taking several cornstarch or baking soda baths daily if the area is too large to cover with a washcloth. Harsh chemicals, such as alkalis or acids, can cause skin damage that is like a burn. You should flush your skin for 15 to 20 minutes with cold water after such an exposure. You should also seek immediate medical care after exposure. Bandages (dressings), antibiotics, and pain medicine may be needed for severely irritated skin.  HOME CARE INSTRUCTIONS  Avoid the substance that caused  your reaction.  Keep the area of skin that is affected away from hot water, soap, sunlight, chemicals, acidic substances, or anything else that would irritate your skin.  Do not scratch the rash. Scratching may cause the rash to become infected.  You may take cool baths to help stop the itching.  Only take over-the-counter or prescription medicines as directed by your caregiver.  See your caregiver for follow-up care as directed to make sure your skin is healing properly. SEEK MEDICAL CARE IF:   Your condition is not better after 3 days of treatment.  You seem to be getting worse.  You see signs of infection such as swelling, tenderness, redness, soreness, or warmth in the affected area.  You have any problems related to your medicines. Document Released: 11/18/2000 Document Revised: 02/13/2012 Document  Reviewed: 04/26/2011 [facility] Patient Information 2014 [place], [place].

## 2013-08-06 ENCOUNTER — Other Ambulatory Visit: Payer: BC Managed Care – PPO

## 2013-08-13 ENCOUNTER — Encounter: Payer: BC Managed Care – PPO | Admitting: Internal Medicine

## 2013-08-28 ENCOUNTER — Other Ambulatory Visit (INDEPENDENT_AMBULATORY_CARE_PROVIDER_SITE_OTHER): Payer: BC Managed Care – PPO

## 2013-08-28 DIAGNOSIS — Z0000 Encounter for general adult medical examination without abnormal findings: Secondary | ICD-10-CM

## 2013-08-28 DIAGNOSIS — E785 Hyperlipidemia, unspecified: Secondary | ICD-10-CM

## 2013-08-28 LAB — HEPATIC FUNCTION PANEL
ALT: 35 U/L (ref 0–53)
Alkaline Phosphatase: 43 U/L (ref 39–117)
Bilirubin, Direct: 0.1 mg/dL (ref 0.0–0.3)
Total Bilirubin: 0.8 mg/dL (ref 0.3–1.2)

## 2013-08-28 LAB — CBC WITH DIFFERENTIAL/PLATELET
Basophils Absolute: 0 10*3/uL (ref 0.0–0.1)
Eosinophils Absolute: 0.2 10*3/uL (ref 0.0–0.7)
HCT: 49.7 % (ref 39.0–52.0)
Hemoglobin: 16.8 g/dL (ref 13.0–17.0)
Lymphs Abs: 3 10*3/uL (ref 0.7–4.0)
MCHC: 33.8 g/dL (ref 30.0–36.0)
MCV: 90.8 fl (ref 78.0–100.0)
Monocytes Absolute: 0.8 10*3/uL (ref 0.1–1.0)
Neutro Abs: 5.8 10*3/uL (ref 1.4–7.7)
RDW: 13.1 % (ref 11.5–14.6)

## 2013-08-28 LAB — LDL CHOLESTEROL, DIRECT: Direct LDL: 192.7 mg/dL

## 2013-08-28 LAB — BASIC METABOLIC PANEL
BUN: 15 mg/dL (ref 6–23)
CO2: 29 mEq/L (ref 19–32)
Glucose, Bld: 111 mg/dL — ABNORMAL HIGH (ref 70–99)
Potassium: 4.4 mEq/L (ref 3.5–5.1)
Sodium: 141 mEq/L (ref 135–145)

## 2013-08-28 LAB — LIPID PANEL: VLDL: 33.2 mg/dL (ref 0.0–40.0)

## 2013-09-04 ENCOUNTER — Encounter: Payer: Self-pay | Admitting: Internal Medicine

## 2013-09-04 ENCOUNTER — Ambulatory Visit (INDEPENDENT_AMBULATORY_CARE_PROVIDER_SITE_OTHER): Payer: BC Managed Care – PPO | Admitting: Internal Medicine

## 2013-09-04 VITALS — BP 116/70 | HR 91 | Temp 98.3°F | Ht 67.25 in | Wt 207.0 lb

## 2013-09-04 DIAGNOSIS — E785 Hyperlipidemia, unspecified: Secondary | ICD-10-CM

## 2013-09-04 DIAGNOSIS — Z0000 Encounter for general adult medical examination without abnormal findings: Secondary | ICD-10-CM

## 2013-09-04 DIAGNOSIS — I10 Essential (primary) hypertension: Secondary | ICD-10-CM

## 2013-09-04 DIAGNOSIS — Z87442 Personal history of urinary calculi: Secondary | ICD-10-CM

## 2013-09-04 DIAGNOSIS — R198 Other specified symptoms and signs involving the digestive system and abdomen: Secondary | ICD-10-CM

## 2013-09-04 DIAGNOSIS — R7309 Other abnormal glucose: Secondary | ICD-10-CM

## 2013-09-04 DIAGNOSIS — R238 Other skin changes: Secondary | ICD-10-CM

## 2013-09-04 DIAGNOSIS — R739 Hyperglycemia, unspecified: Secondary | ICD-10-CM | POA: Insufficient documentation

## 2013-09-04 DIAGNOSIS — R239 Unspecified skin changes: Secondary | ICD-10-CM

## 2013-09-04 DIAGNOSIS — R141 Gas pain: Secondary | ICD-10-CM

## 2013-09-04 DIAGNOSIS — R140 Abdominal distension (gaseous): Secondary | ICD-10-CM | POA: Insufficient documentation

## 2013-09-04 DIAGNOSIS — R194 Change in bowel habit: Secondary | ICD-10-CM

## 2013-09-04 DIAGNOSIS — R12 Heartburn: Secondary | ICD-10-CM

## 2013-09-04 NOTE — Progress Notes (Signed)
Chief Complaint  Patient presents with  . Annual Exam    has a list   . Hypertension    HPI: Patient comes in today for Preventive Health Care visit  Has a list of issues concerns   Is 8-9 poundsgain  this summer  Prostatitis   And prostadynia .seen at urgent care Took probiotic   Multiple strains  With some help in bloating and   And had course of  cipro  For 7 days.  Omega 3s  Teaspoons. No change  Also had change in stool  shape .    Had colon age 52 but no blood  Nocturnal intermittent  Chest sx see below  Father borderline dm  BP has been good denies se of med.    ROS:  GEN/ HEENT: No fever, significant weight changes sweats headaches vision problems hearing changes, CV/ PULM; No chest pain shortness of breath cough, syncope,edema  change in exercise tolerance. GI /GU: No adominal pain, vomiting, change in bowel habits. No blood in the stool. No significant GU symptoms. Bloating and worse   HB  This summer . SKIN/HEME: ,no acute skin rashes suspicious lesions or bleeding. No lymphadenopathy, nodules, masses.  Areas on face and arms to check  NEURO/ PSYCH:  No neurologic signs such as weakness numbness. No depression anxiety. IMM/ Allergy: No unusual infections.  Allergy .   REST of 12 system review negative except as per HPI Awaken in night like   Cant breath poss reflux  Like buble in chest  ( not air gasping choking)    Past Medical History  Diagnosis Date  . Allergic rhinitis   . Anxiety   . Depression   . Hyperlipidemia   . Hypertension   . History of nephrolithiasis   . Sebaceous cyst     Family History  Problem Relation Age of Onset  . Diabetes Father   . Hypertension Father   . Kidney cancer Father     now on dialysis  . Cancer Father     kidney    History   Social History  . Marital Status: Married    Spouse Name: N/A    Number of Children: N/A  . Years of Education: N/A   Social History Main Topics  . Smoking status: Never Smoker   . Smokeless  tobacco: Never Used  . Alcohol Use: Yes     Comment: less than 1 a week  . Drug Use: No  . Sexual Activity: Yes   Other Topics Concern  . None   Social History Narrative   Married   Regular exercise- no   HH of 3   Dog car and lizard   Sleep wakening at times   Past Surgical History  Procedure Laterality Date  . Tonsillectomy      x 2  . Tympanostomy tube placement    . Kidney stones removed      x 2     Outpatient Encounter Prescriptions as of 09/04/2013  Medication Sig Dispense Refill  . acetaminophen (TYLENOL) 325 MG tablet Take 650 mg by mouth every 6 (six) hours as needed.        . Ascorbic Acid (VITAMIN C) 100 MG tablet Take 100 mg by mouth daily.      Marland Kitchen lisinopril-hydrochlorothiazide (PRINZIDE,ZESTORETIC) 20-12.5 MG per tablet TAKE 1 TABLET BY MOUTH EVERY DAY FOR HIGH BLOOD PRESSURE  90 tablet  2  . Multiple Vitamins-Minerals (MENS MULTIVITAMIN PLUS PO) Take by mouth.      Marland Kitchen  Omega-3 Fatty Acids (OMEGA 3 PO) Take by mouth. 1 tsp once daily.  1600mg       . Quercetin 250 MG TABS Take by mouth.        . [DISCONTINUED] clotrimazole-betamethasone (LOTRISONE) cream Apply topically 2 (two) times daily.  30 g  0  . [DISCONTINUED] fluconazole (DIFLUCAN) 150 MG tablet Take 1 tablet (150 mg total) by mouth once.  5 tablet  0  . [DISCONTINUED] ketoconazole (NIZORAL) 2 % cream Apply topically 2 (two) times daily.  30 g  0  . [DISCONTINUED] ketoconazole (NIZORAL) 200 MG tablet Take 1 tablet (200 mg total) by mouth daily.  14 tablet  0   No facility-administered encounter medications on file as of 09/04/2013.    EXAM:  BP 116/70  Pulse 91  Temp(Src) 98.3 F (36.8 C) (Oral)  Ht 5' 7.25" (1.708 m)  Wt 207 lb (93.895 kg)  BMI 32.19 kg/m2  SpO2 96%  Body mass index is 32.19 kg/(m^2).  Physical Exam: Vital signs reviewed WUJ:WJXB is a well-developed well-nourished alert cooperative   male who appears  stated age in no acute distress.  HEENT: normocephalic atraumatic , Eyes:  PERRL EOM's full, conjunctiva clear, Nares: paten,t no deformity discharge or tenderness., Ears: no deformity EAC's clear TMs with normal landmarks. Mouth: clear OP, no lesions, edema.  Moist mucous membranes. Dentition in adequate repair. NECK: supple without masses, thyromegaly or bruits. CHEST/PULM:  Clear to auscultation and percussion breath sounds equal no wheeze , rales or rhonchi. No chest wall deformities or tenderness. CV: PMI is nondisplaced, S1 S2 no gallops, murmurs, rubs. Peripheral pulses are full without delay.No JVD .  ABDOMEN: Bowel sounds normal nontender  No guard or rebound, no hepato splenomegal no CVA tenderness.  No hernia. No fluuid wave  Extremtities:  No clubbing cyanosis or edema, no acute joint swelling or redness no focal atrophy NEURO:  Oriented x3, cranial nerves 3-12 appear to be intact, no obvious focal weakness,gait within normal limits no abnormal reflexes or asymmetrical SKIN: No acute rashes normal turgor, color, no bruising or petechiae. Number or lesions left forearm pink indurated smooth left temple ? Keratotic area  Other sun  Changes and rough patches  Gu deferred as done  This year at urgent care  PSYCH: Oriented, good eye contact, no obvious depression anxiety, cognition and judgment appear normal. LN: no cervical axillary inguinal adenopathy  Lab Results  Component Value Date   WBC 9.9 08/28/2013   HGB 16.8 08/28/2013   HCT 49.7 08/28/2013   PLT 259.0 08/28/2013   GLUCOSE 111* 08/28/2013   CHOL 268* 08/28/2013   TRIG 166.0* 08/28/2013   HDL 49.00 08/28/2013   LDLDIRECT 192.7 08/28/2013   ALT 35 08/28/2013   AST 22 08/28/2013   NA 141 08/28/2013   K 4.4 08/28/2013   CL 105 08/28/2013   CREATININE 1.2 08/28/2013   BUN 15 08/28/2013   CO2 29 08/28/2013   TSH 0.78 08/28/2013   PSA 0.85 08/28/2013   Wt Readings from Last 3 Encounters:  09/04/13 207 lb (93.895 kg)  07/02/13 207 lb 6.4 oz (94.076 kg)  06/04/13 202 lb (91.627 kg)    ASSESSMENT AND  PLAN:  Discussed the following assessment and plan:  Visit for preventive health examination - declines flu vaccine counseled  HYPERTENSION - controlled   HYPERLIPIDEMIA - now   meets criteria for statin use. had se of simva consider other pt want to wait until next check and revisit this  - Plan: Amb  ref to Medical Nutrition Therapy-MNT  Hyperglycemia - prediabetic range  - Plan: Amb ref to Medical Nutrition Therapy-MNT  Abdominal bloating - newer onset  - Plan: Ambulatory referral to Gastroenterology, US Abdomen Complete  Skin change - sun exposure fam hx skin cancers some  aks  uncertain of area on face    Change in bowel habits - Plan: Ambulatory referral to Gastroenterology, US Abdomen Complete  NEPHROLITHIASIS, HX OF - Plan: US Abdomen Complete  Heartburn symptom - poss nocturnal gerd  Complex of lipids and bg and ht cw dysmetabolism condition but no strong family hx  Says he is eating clean  However increase bloating and abd concerns  Would involve GI input  Get abd ultrasound    Continue probiotics  Intensify diet  And exercise as possible  Would advise statin and poss metformin  If indicated   Patient Care Team: Madelin Headings, MD as PCP - General Patient Instructions  Weight  loss exercise and poss diet changes    Your labs shows   Elevated blood sugar that show risk of developing diabetes .   Your cholesterol levels are worse at around the level of the getting statin medicines but it is certainly reasonable because of your history of side effects of 1 statin to intensify lifestyle intervention.  Can see skin surgery  center for the skin lesions as we discussed.  Someone will contact you about nutrition consult GI referral and I will order abdominal ultrasound in the meantime.   X-ray in the summer showed that you might have a kidney stone but this doesn't seem to be causing her problem.  Take to stay on probiotics add over-the-counter omeprazole or Prilosec one  a day until you see the GI people.  Avoidance of late-night eating. If you reconsider and want a flu vaccine you can get this in any time.  Lipid panel hemoglobin A1c and chemistry in 3 months and then followup visit.      Neta Mends. Donita Newland M.D. Health Maintenance  Topic Date Due  . Influenza Vaccine  08/05/2014  . Tetanus/tdap  03/31/2020  . Colonoscopy  07/05/2020   Health Maintenance Review

## 2013-09-04 NOTE — Patient Instructions (Addendum)
Weight  loss exercise and poss diet changes    Your labs shows   Elevated blood sugar that show risk of developing diabetes .   Your cholesterol levels are worse at around the level of the getting statin medicines but it is certainly reasonable because of your history of side effects of 1 statin to intensify lifestyle intervention.  Can see skin surgery  center for the skin lesions as we discussed.  Someone will contact you about nutrition consult GI referral and I will order abdominal ultrasound in the meantime.   X-ray in the summer showed that you might have a kidney stone but this doesn't seem to be causing her problem.  Take to stay on probiotics add over-the-counter omeprazole or Prilosec one a day until you see the GI people.  Avoidance of late-night eating. If you reconsider and want a flu vaccine you can get this in any time.  Lipid panel hemoglobin A1c and chemistry in 3 months and then followup visit.

## 2013-09-07 DIAGNOSIS — R239 Unspecified skin changes: Secondary | ICD-10-CM | POA: Insufficient documentation

## 2013-09-07 DIAGNOSIS — Z79899 Other long term (current) drug therapy: Secondary | ICD-10-CM | POA: Insufficient documentation

## 2013-09-07 DIAGNOSIS — R12 Heartburn: Secondary | ICD-10-CM | POA: Insufficient documentation

## 2013-09-07 DIAGNOSIS — R194 Change in bowel habit: Secondary | ICD-10-CM | POA: Insufficient documentation

## 2013-09-12 ENCOUNTER — Encounter: Payer: Self-pay | Admitting: Gastroenterology

## 2013-10-25 ENCOUNTER — Ambulatory Visit (INDEPENDENT_AMBULATORY_CARE_PROVIDER_SITE_OTHER): Payer: BC Managed Care – PPO | Admitting: Gastroenterology

## 2013-10-25 ENCOUNTER — Encounter: Payer: Self-pay | Admitting: Gastroenterology

## 2013-10-25 VITALS — BP 122/80 | HR 72 | Ht 67.25 in | Wt 210.4 lb

## 2013-10-25 DIAGNOSIS — K3189 Other diseases of stomach and duodenum: Secondary | ICD-10-CM | POA: Insufficient documentation

## 2013-10-25 DIAGNOSIS — R194 Change in bowel habit: Secondary | ICD-10-CM

## 2013-10-25 DIAGNOSIS — R198 Other specified symptoms and signs involving the digestive system and abdomen: Secondary | ICD-10-CM

## 2013-10-25 MED ORDER — ESOMEPRAZOLE MAGNESIUM 40 MG PO CPDR: 40.0000 mg | DELAYED_RELEASE_CAPSULE | Freq: Every day | ORAL | Status: DC

## 2013-10-25 NOTE — Progress Notes (Signed)
History of Present Illness: The patient is a 52 year old white male referred for evaluation of change in bowel habits abdominal bloating.  For several months he's been complaining of bloating and abdominal distention.  Stools are a little bit erratic.  He's noted some flattening of the stools.  He denies rectal or abdominal pain, per se, nausea.  He has started taking a probiotic daily.  He has had episodes of nocturnal GERD characterized awakening with coughing and fluid in his throat.  He has modified his diet to where he frequently takes protein shakes for lunch and a liquid meal for breakfast as well.  In addition, he  has noted occasional ribbon -like stools.  He denies rectal bleeding or melena.  Colonoscopy in 2011 demonstrated a hyperplastic polyp.    Past Medical History  Diagnosis Date  . Allergic rhinitis   . Anxiety   . Depression   . Hyperlipidemia   . Hypertension   . History of nephrolithiasis   . Sebaceous cyst    Past Surgical History  Procedure Laterality Date  . Tonsillectomy      x 2  . Tympanostomy tube placement    . Kidney stones removed      x 2   family history includes Diabetes in his father; Hypertension in his father; Kidney cancer in his father. Current Outpatient Prescriptions  Medication Sig Dispense Refill  . acetaminophen (TYLENOL) 325 MG tablet Take 650 mg by mouth every 6 (six) hours as needed.        . Ascorbic Acid (VITAMIN C) 100 MG tablet Take 100 mg by mouth daily.      Marland Kitchen lisinopril-hydrochlorothiazide (PRINZIDE,ZESTORETIC) 20-12.5 MG per tablet TAKE 1 TABLET BY MOUTH EVERY DAY FOR HIGH BLOOD PRESSURE  90 tablet  2  . Multiple Vitamins-Minerals (MENS MULTIVITAMIN PLUS PO) Take by mouth.      . Omega-3 Fatty Acids (OMEGA 3 PO) Take by mouth. 1 tsp once daily.  1600mg       . Probiotic Product (PROBIOTIC PO) Take 1 tablet by mouth daily.      . Quercetin 250 MG TABS Take by mouth.         No current facility-administered medications for this  visit.   Allergies as of 10/25/2013 - Review Complete 10/25/2013  Allergen Reaction Noted  . Zocor [simvastatin]  05/17/2011  . Penicillins Hives     reports that he has never smoked. He has never used smokeless tobacco. He reports that he drinks alcohol. He reports that he does not use illicit drugs.     Review of Systems: Pertinent positive and negative review of systems were noted in the above HPI section. All other review of systems were otherwise negative.  Vital signs were reviewed in today's medical record Physical Exam: General: Well developed , well nourished, no acute distress Skin: anicteric Head: Normocephalic and atraumatic Eyes:  sclerae anicteric, EOMI Ears: Normal auditory acuity Mouth: No deformity or lesions Neck: Supple, no masses or thyromegaly Lungs: Clear throughout to auscultation Heart: Regular rate and rhythm; no murmurs, rubs or bruits Abdomen: Soft, non tender and non distended. No masses, hepatosplenomegaly or hernias noted. Normal Bowel sounds Rectal:deferred Musculoskeletal: Symmetrical with no gross deformities  Skin: No lesions on visible extremities Pulses:  Normal pulses noted Extremities: No clubbing, cyanosis, edema or deformities noted Neurological: Alert oriented x 4, grossly nonfocal Cervical Nodes:  No significant cervical adenopathy Inguinal Nodes: No significant inguinal adenopathy Psychological:  Alert and cooperative. Normal mood and affect

## 2013-10-25 NOTE — Assessment & Plan Note (Signed)
Change in bowel habits is relatively minor.  He's concerned about occasional ribbon -- like stools.  I suggested that this may reflect hemorrhoidal disease or dietary input.  A structural abnormality of the rectum or colon is much less likely.  Recommendations #1 fiber supplementation daily #2 reassess following #1 and dietary changes #3 to consider sigmoidoscopy if no improvement is realized by dietary changes and if stools do not normalize in shape

## 2013-10-25 NOTE — Assessment & Plan Note (Signed)
The patient's symptoms are nonspecific and may reflect dietary changes.  He's taking protein shakes several times a week and takes a daily probiotic.  He is having some symptoms suggestive of GERD which also may be contributing.  Recommendations #1 patient was instructed to reduce the number of protein shakes and try well-balanced solids foods instead #2 hold probiotics #3 begin Nexium 40 mg daily

## 2013-10-25 NOTE — Patient Instructions (Signed)
Discontinue Probiotics Supplement with fiber daily Follow up in one month

## 2013-12-11 ENCOUNTER — Ambulatory Visit: Payer: BC Managed Care – PPO | Admitting: Internal Medicine

## 2013-12-11 DIAGNOSIS — E785 Hyperlipidemia, unspecified: Secondary | ICD-10-CM

## 2013-12-11 LAB — LDL CHOLESTEROL, DIRECT: Direct LDL: 190.2 mg/dL

## 2013-12-11 LAB — BASIC METABOLIC PANEL
BUN: 12 mg/dL (ref 6–23)
CALCIUM: 9.2 mg/dL (ref 8.4–10.5)
CHLORIDE: 103 meq/L (ref 96–112)
CO2: 27 meq/L (ref 19–32)
CREATININE: 1.2 mg/dL (ref 0.4–1.5)
GFR: 66.34 mL/min (ref >60.00)
GLUCOSE: 108 mg/dL — AB (ref 70–99)
Potassium: 4 mEq/L (ref 3.5–5.1)
SODIUM: 138 meq/L (ref 135–145)

## 2013-12-11 LAB — LIPID PANEL
CHOL/HDL RATIO: 6
CHOLESTEROL: 267 mg/dL — AB (ref 0–200)
HDL: 41.1 mg/dL (ref >39.00)
Triglycerides: 216 mg/dL — ABNORMAL HIGH (ref 0.0–149.0)
VLDL: 43.2 mg/dL — ABNORMAL HIGH (ref 0.0–40.0)

## 2013-12-11 LAB — HEMOGLOBIN A1C: Hgb A1c MFr Bld: 6.4 % (ref 4.6–6.5)

## 2013-12-11 NOTE — Patient Instructions (Addendum)
, °

## 2013-12-23 ENCOUNTER — Encounter: Payer: Self-pay | Admitting: Family Medicine

## 2014-01-29 ENCOUNTER — Other Ambulatory Visit: Payer: Self-pay | Admitting: Internal Medicine

## 2014-04-03 NOTE — Progress Notes (Unsigned)
   Subjective:    Patient ID: Jonathan [name], male    DOB: 1961/03/06, 53 y.o.   MRN: [id number]  HPI    Review of Systems     Objective:   Physical Exam        Assessment & Plan:  Pt not seen

## 2014-09-22 ENCOUNTER — Telehealth: Payer: Self-pay | Admitting: Family Medicine

## 2014-09-22 ENCOUNTER — Other Ambulatory Visit: Payer: Self-pay | Admitting: Internal Medicine

## 2014-09-22 ENCOUNTER — Other Ambulatory Visit: Payer: Self-pay | Admitting: Family Medicine

## 2014-09-22 DIAGNOSIS — R739 Hyperglycemia, unspecified: Secondary | ICD-10-CM

## 2014-09-22 DIAGNOSIS — Z0000 Encounter for general adult medical examination without abnormal findings: Secondary | ICD-10-CM

## 2014-09-22 NOTE — Telephone Encounter (Signed)
This patient is now past due for lab work and CPX.  Please contact the pt to help him get placed on the schedule for both appointments.  I have placed the lab orders. Thanks!

## 2014-09-22 NOTE — Telephone Encounter (Signed)
Sent to the pharmacy by e-scribe.  Pt is now past due for his CPX.  Will send a message to scheduling to help the pt get on the schedule.

## 2014-09-22 NOTE — Telephone Encounter (Signed)
lmom for pt to sch cpx

## 2014-09-25 NOTE — Telephone Encounter (Signed)
lmom for pt to sch cpx

## 2014-09-30 NOTE — Telephone Encounter (Signed)
lmom for pt to sch cpx

## 2014-11-25 ENCOUNTER — Other Ambulatory Visit: Payer: Self-pay | Admitting: Internal Medicine

## 2014-11-25 NOTE — Telephone Encounter (Signed)
Sent to the pharmacy by e-scribe.  Pt has upcoming CPX on 02/09/14

## 2015-02-04 ENCOUNTER — Other Ambulatory Visit (INDEPENDENT_AMBULATORY_CARE_PROVIDER_SITE_OTHER): Payer: BLUE CROSS/BLUE SHIELD

## 2015-02-04 DIAGNOSIS — R739 Hyperglycemia, unspecified: Secondary | ICD-10-CM

## 2015-02-04 DIAGNOSIS — Z0000 Encounter for general adult medical examination without abnormal findings: Secondary | ICD-10-CM

## 2015-02-04 LAB — LIPID PANEL
CHOL/HDL RATIO: 6
Cholesterol: 244 mg/dL — ABNORMAL HIGH (ref 0–200)
HDL: 43.9 mg/dL (ref >39.00)
LDL CALC: 165 mg/dL — AB (ref 0–99)
NonHDL: 200.1
Triglycerides: 177 mg/dL — ABNORMAL HIGH (ref 0.0–149.0)
VLDL: 35.4 mg/dL (ref 0.0–40.0)

## 2015-02-04 LAB — CBC WITH DIFFERENTIAL/PLATELET
Basophils Absolute: 0.1 10*3/uL (ref 0.0–0.1)
Basophils Relative: 0.7 % (ref 0.0–3.0)
Eosinophils Absolute: 0.3 10*3/uL (ref 0.0–0.7)
Eosinophils Relative: 3.3 % (ref 0.0–5.0)
HEMATOCRIT: 49.5 % (ref 39.0–52.0)
Hemoglobin: 17.3 g/dL — ABNORMAL HIGH (ref 13.0–17.0)
Lymphocytes Relative: 29.1 % (ref 12.0–46.0)
Lymphs Abs: 2.8 10*3/uL (ref 0.7–4.0)
MCHC: 34.9 g/dL (ref 30.0–36.0)
MCV: 87.6 fl (ref 78.0–100.0)
MONOS PCT: 10 % (ref 3.0–12.0)
Monocytes Absolute: 0.9 10*3/uL (ref 0.1–1.0)
NEUTROS PCT: 56.9 % (ref 43.0–77.0)
Neutro Abs: 5.4 10*3/uL (ref 1.4–7.7)
PLATELETS: 254 10*3/uL (ref 150.0–400.0)
RBC: 5.65 Mil/uL (ref 4.22–5.81)
RDW: 13 % (ref 11.5–15.5)
WBC: 9.5 10*3/uL (ref 4.0–10.5)

## 2015-02-04 LAB — BASIC METABOLIC PANEL
BUN: 18 mg/dL (ref 6–23)
CHLORIDE: 101 meq/L (ref 96–112)
CO2: 28 meq/L (ref 19–32)
Calcium: 9.3 mg/dL (ref 8.4–10.5)
Creatinine, Ser: 1.33 mg/dL (ref 0.40–1.50)
GFR: 59.23 mL/min — ABNORMAL LOW (ref >60.00)
GLUCOSE: 130 mg/dL — AB (ref 70–99)
POTASSIUM: 3.7 meq/L (ref 3.5–5.1)
Sodium: 138 mEq/L (ref 135–145)

## 2015-02-04 LAB — HEMOGLOBIN A1C: Hgb A1c MFr Bld: 6.7 % — ABNORMAL HIGH (ref 4.6–6.5)

## 2015-02-04 LAB — HEPATIC FUNCTION PANEL
ALBUMIN: 4.3 g/dL (ref 3.5–5.2)
ALK PHOS: 52 U/L (ref 39–117)
ALT: 33 U/L (ref 0–53)
AST: 20 U/L (ref 0–37)
BILIRUBIN TOTAL: 0.8 mg/dL (ref 0.2–1.2)
Bilirubin, Direct: 0.1 mg/dL (ref 0.0–0.3)
Total Protein: 7 g/dL (ref 6.0–8.3)

## 2015-02-04 LAB — TSH: TSH: 0.93 u[IU]/mL (ref 0.35–4.50)

## 2015-02-10 ENCOUNTER — Encounter: Payer: Self-pay | Admitting: Internal Medicine

## 2015-02-10 ENCOUNTER — Ambulatory Visit (INDEPENDENT_AMBULATORY_CARE_PROVIDER_SITE_OTHER): Payer: BLUE CROSS/BLUE SHIELD | Admitting: Internal Medicine

## 2015-02-10 VITALS — BP 122/70 | Temp 98.3°F | Ht 67.25 in | Wt 206.0 lb

## 2015-02-10 DIAGNOSIS — E119 Type 2 diabetes mellitus without complications: Secondary | ICD-10-CM | POA: Diagnosis not present

## 2015-02-10 DIAGNOSIS — E785 Hyperlipidemia, unspecified: Secondary | ICD-10-CM | POA: Diagnosis not present

## 2015-02-10 DIAGNOSIS — Z0000 Encounter for general adult medical examination without abnormal findings: Secondary | ICD-10-CM | POA: Diagnosis not present

## 2015-02-10 DIAGNOSIS — X32XXXA Exposure to sunlight, initial encounter: Secondary | ICD-10-CM

## 2015-02-10 DIAGNOSIS — D582 Other hemoglobinopathies: Secondary | ICD-10-CM

## 2015-02-10 DIAGNOSIS — R198 Other specified symptoms and signs involving the digestive system and abdomen: Secondary | ICD-10-CM

## 2015-02-10 DIAGNOSIS — L570 Actinic keratosis: Secondary | ICD-10-CM | POA: Diagnosis not present

## 2015-02-10 DIAGNOSIS — I10 Essential (primary) hypertension: Secondary | ICD-10-CM

## 2015-02-10 MED ORDER — METFORMIN HCL ER 500 MG PO TB24: 500.0000 mg | ORAL_TABLET | Freq: Every day | ORAL | Status: DC

## 2015-02-10 NOTE — Progress Notes (Signed)
Pre visit review using our clinic review tool, if applicable. No additional management support is needed unless otherwise documented below in the visit note.  Chief Complaint  Patient presents with  . Annual Exam  . Hypertension    HPI: Patient  Jonathan [name]  54 y.o. comes in today for Preventive Health Care visit   He states he feels good since he has taken wheat out of his diet at the advice of chiropractor. He has more energy less GI upset although his stomach still isn't right times.  Bp taking medication no problem   Not high .   Father had skin cancer he has some crusted areas ask questions about these on his face and scalp. Has never seen a dermatologist for this.  Health Maintenance  Topic Date Due  . PNEUMOCOCCAL POLYSACCHARIDE VACCINE (1) 08/17/1961  . FOOT EXAM  08/17/1969  . OPHTHALMOLOGY EXAM  08/17/1969  . URINE MICROALBUMIN  08/17/1969  . INFLUENZA VACCINE  03/05/2015 (Originally 07/05/2014)  . HIV Screening  02/03/2016 (Originally 08/17/1974)  . HEMOGLOBIN A1C  08/07/2015  . TETANUS/TDAP  03/31/2020  . COLONOSCOPY  07/05/2020   Health Maintenance Review LIFESTYLE:  Exercise:   Not officially but active  Tobacco/ETS:no Alcohol: rare Sugar beverages: some sweet tea   Qod OJ  Sleep: about 8  Drug use: no Colonoscopy:  5 years ago.   ROS: Denies signs of sleep apnea snoring unusual supplements. No GU symptoms but would like to have a PSA test. Negative family history. GEN/ HEENT: No fever, significant weight changes sweats headaches vision problems hearing changes, CV/ PULM; No chest pain shortness of breath cough, syncope,edema  change in exercise tolerance. GI /GU: No adominal pain, vomiting, change in bowel habits. Tends to have some stomach issues. Is up-to-date on his colonoscopy. No blood in the stool. No significant GU symptoms. SKIN/HEME: ,no acute skin rashes has some bothersome skin tags. or bleeding. No lymphadenopathy, nodules, masses.  NEURO/  PSYCH:  No neurologic signs such as weakness numbness. No depression anxiety. IMM/ Allergy: No unusual infections.  Allergy .   REST of 12 system review negative except as per HPI   Past Medical History  Diagnosis Date  . Allergic rhinitis   . Anxiety   . Depression   . Hyperlipidemia   . Hypertension   . History of nephrolithiasis   . Sebaceous cyst     Past Surgical History  Procedure Laterality Date  . Tonsillectomy      x 2  . Tympanostomy tube placement    . Kidney stones removed      x 2    Family History  Problem Relation Age of Onset  . Diabetes Father   . Hypertension Father   . Kidney cancer Father     now on dialysis    History   Social History  . Marital Status: Married    Spouse Name: N/A  . Number of Children: 1  . Years of Education: N/A   Occupational History  . MINISTER    Social History Main Topics  . Smoking status: Never Smoker   . Smokeless tobacco: Never Used  . Alcohol Use: Yes     Comment: less than 1 a week  . Drug Use: No  . Sexual Activity: Yes   Other Topics Concern  . None   Social History Narrative   Married   Regular exercise- no   HH of 3   Dog car and lizard   Sleep wakening  at times   About 40 hours  Per week reg sleep.     Outpatient Encounter Prescriptions as of 02/10/2015  Medication Sig  . acetaminophen (TYLENOL) 325 MG tablet Take 650 mg by mouth every 6 (six) hours as needed.    . IBUPROFEN PO Take 400 mg by mouth as needed.  Marland Kitchen lisinopril-hydrochlorothiazide (PRINZIDE,ZESTORETIC) 20-12.5 MG per tablet TAKE 1 TABLET BY MOUTH EVERY DAY FOR HIGH BLOOD PRESSURE  . Quercetin 250 MG TABS Take by mouth.    . [DISCONTINUED] lisinopril-hydrochlorothiazide (PRINZIDE,ZESTORETIC) 20-12.5 MG per tablet TAKE 1 TABLET BY MOUTH EVERY DAY FOR HIGH BLOOD PRESSURE  . metFORMIN (GLUCOPHAGE-XR) 500 MG 24 hr tablet Take 1 tablet (500 mg total) by mouth daily with breakfast.  . [DISCONTINUED] Ascorbic Acid (VITAMIN C) 100 MG  tablet Take 100 mg by mouth daily.  . [DISCONTINUED] esomeprazole (NEXIUM) 40 MG capsule Take 1 capsule (40 mg total) by mouth daily at 12 noon.  . [DISCONTINUED] Multiple Vitamins-Minerals (MENS MULTIVITAMIN PLUS PO) Take by mouth.  . [DISCONTINUED] Omega-3 Fatty Acids (OMEGA 3 PO) Take by mouth. 1 tsp once daily.  1600mg   . [DISCONTINUED] Probiotic Product (PROBIOTIC PO) Take 1 tablet by mouth daily.    EXAM:  BP 122/70 mmHg  Temp(Src) 98.3 F (36.8 C) (Oral)  Ht 5' 7.25" (1.708 m)  Wt 206 lb (93.441 kg)  BMI 32.03 kg/m2  Body mass index is 32.03 kg/(m^2).  Physical Exam: Vital signs reviewed EAV:WUJW is a well-developed well-nourished alert cooperative    who appearsr stated age in no acute distress.  HEENT: normocephalic atraumatic , Eyes: PERRL EOM's full, conjunctiva clear, Nares: paten,t no deformity discharge or tenderness., Ears: no deformity EAC's clear TMs with normal landmarks. Mouth: clear OP, no lesions, edema.  Moist mucous membranes. Dentition in adequate repair. NECK: supple without masses, thyromegaly or bruits. CHEST/PULM:  Clear to auscultation and percussion breath sounds equal no wheeze , rales or rhonchi. No chest wall deformities or tenderness. CV: PMI is nondisplaced, S1 S2 no gallops, murmurs, rubs. Peripheral pulses are full without delay.No JVD .  ABDOMEN: Bowel sounds normal nontender  No guard or rebound, no hepato splenomegal no CVA tenderness.   Declines rectal prostate exam says he had one last year. Extremtities:  No clubbing cyanosis or edema, no acute joint swelling or redness no focal atrophy NEURO:  Oriented x3, cranial nerves 3-12 appear to be intact, no obvious focal weakness,gait within normal limits no abnormal reflexes or asymmetrical SKIN: No acute rashes normal turgor, color, no bruising or petechiae. He has a number of keratotic lesions scalp temporal area legs. PSYCH: Oriented, good eye contact, no obvious depression anxiety, cognition and  judgment appear normal. LN: no cervical axillary inguinal adenopathy Diabetes foot exam done normal pulses and sensation to monofilament. Lab Results  Component Value Date   WBC 9.5 02/04/2015   HGB 17.3* 02/04/2015   HCT 49.5 02/04/2015   PLT 254.0 02/04/2015   GLUCOSE 130* 02/04/2015   CHOL 244* 02/04/2015   TRIG 177.0* 02/04/2015   HDL 43.90 02/04/2015   LDLDIRECT 190.2 12/11/2013   LDLCALC 165* 02/04/2015   ALT 33 02/04/2015   AST 20 02/04/2015   NA 138 02/04/2015   K 3.7 02/04/2015   CL 101 02/04/2015   CREATININE 1.33 02/04/2015   BUN 18 02/04/2015   CO2 28 02/04/2015   TSH 0.93 02/04/2015   PSA 0.85 08/28/2013   HGBA1C 6.7* 02/04/2015   BP Readings from Last 3 Encounters:  02/10/15 122/70  10/25/13 122/80  09/04/13 116/70   Wt Readings from Last 3 Encounters:  02/10/15 206 lb (93.441 kg)  10/25/13 210 lb 6 oz (95.425 kg)  09/04/13 207 lb (93.895 kg)    ASSESSMENT AND PLAN:  Discussed the following assessment and plan:  Visit for preventive health examination - Declines flu vaccine ,HIVscreen :asks for PSA risk-benefit discussed we'll get with next lab test  Essential hypertension - Controlled continue medicine  New onset type 2 diabetes mellitus - Mildly elevated but consistent discuss lifestyle intervention declined referral to diabetes nutrition.Will consider taking metformin 500 a day and follow-up  Hyperlipidemia - Lifestyle intervention at this point we'll address in follow-up.  SK (solar keratosis) - needs derm evalmultile changes  Elevated hemoglobin - x 1 no history of sleep apnea or cause.  GI symptoms - Ongoing DC'd week from diet with help. Check celiac panel at next blood draw ppi use  Advise metformin diabetes referral clos fu  insteive lsi needed  Trial of statin in the past  Patient Care Team: Burnis Medin, MD as PCP - General Patient Instructions   Intensify lifestyle interventions. Labs are now in diabetes range . No sugars in  beverages processed carbs .  Weight  loss will help  Add metformin 500 mg xr once a day also. Continue bp medication good control .  Lab pre visit in 3-4 months  PSA hga1c, celiac ,  cbcdiff ,urinalysis  With protein screen .  Skin evaluation at [facility]  [address] [place], [place] [address] [phone]       Why follow it? Research shows. . Those who follow the Mediterranean diet have a reduced risk of heart disease  . The diet is associated with a reduced incidence of Parkinson's and Alzheimer's diseases . People following the diet may have longer life expectancies and lower rates of chronic diseases  . The Dietary Guidelines for Americans recommends the Mediterranean diet as an eating plan to promote health and prevent disease  What Is the Mediterranean Diet?  . Healthy eating plan based on typical foods and recipes of Mediterranean-style cooking . The diet is primarily a plant based diet; these foods should make up a majority of meals   Starches - Plant based foods should make up a majority of meals - They are an important sources of vitamins, minerals, energy, antioxidants, and fiber - Choose whole grains, foods high in fiber and minimally processed items  - Typical grain sources include wheat, oats, barley, corn, brown rice, bulgar, farro, millet, polenta, couscous  - Various types of beans include chickpeas, lentils, fava beans, black beans, white beans   Fruits  Veggies - Large quantities of antioxidant rich fruits & veggies; 6 or more servings  - Vegetables can be eaten raw or lightly drizzled with oil and cooked  - Vegetables common to the traditional Mediterranean Diet include: artichokes, arugula, beets, broccoli, brussel sprouts, cabbage, carrots, celery, collard greens, cucumbers, eggplant, kale, leeks, lemons, lettuce, mushrooms, okra, onions, peas, peppers, potatoes, pumpkin, radishes, rutabaga, shallots, spinach, sweet potatoes,  turnips, zucchini - Fruits common to the Mediterranean Diet include: apples, apricots, avocados, cherries, clementines, dates, figs, grapefruits, grapes, melons, nectarines, oranges, peaches, pears, pomegranates, strawberries, tangerines  Fats - Replace butter and margarine with healthy oils, such as olive oil, canola oil, and tahini  - Limit nuts to no more than a handful a day  - Nuts include walnuts, almonds, pecans, pistachios, pine nuts  - Limit or  avoid candied, honey roasted or heavily salted nuts - Olives are central to the Mediterranean diet - can be eaten whole or used in a variety of dishes   Meats Protein - Limiting red meat: no more than a few times a month - When eating red meat: choose lean cuts and keep the portion to the size of deck of cards - Eggs: approx. 0 to 4 times a week  - Fish and lean poultry: at least 2 a week  - Healthy protein sources include, chicken, [place], lean beef, lamb - Increase intake of seafood such as tuna, salmon, trout, mackerel, shrimp, scallops - Avoid or limit high fat processed meats such as sausage and bacon  Dairy - Include moderate amounts of low fat dairy products  - Focus on healthy dairy such as fat free yogurt, skim milk, low or reduced fat cheese - Limit dairy products higher in fat such as whole or 2% milk, cheese, ice cream  Alcohol - Moderate amounts of red wine is ok  - No more than 5 oz daily for women (all ages) and men older than age 37  - No more than 10 oz of wine daily for men younger than 83  Other - Limit sweets and other desserts  - Use herbs and spices instead of salt to flavor foods  - Herbs and spices common to the traditional Mediterranean Diet include: basil, bay leaves, chives, cloves, cumin, fennel, garlic, lavender, marjoram, mint, oregano, parsley, pepper, rosemary, sage, savory, sumac, tarragon, thyme   It's not just a diet, it's a lifestyle:  . The Mediterranean diet includes lifestyle factors typical of those in  the region  . Foods, drinks and meals are best eaten with others and savored . Daily physical activity is important for overall good health . This could be strenuous exercise like running and aerobics . This could also be more leisurely activities such as walking, housework, yard-work, or taking the stairs . Moderation is the key; a balanced and healthy diet accommodates most foods and drinks . Consider portion sizes and frequency of consumption of certain foods   Meal Ideas & Options:  . Breakfast:  o Whole wheat toast or whole wheat English muffins with peanut butter & hard boiled egg o Steel cut oats topped with apples & cinnamon and skim milk  o Fresh fruit: banana, strawberries, melon, berries, peaches  o Smoothies: strawberries, bananas, greek yogurt, peanut butter o Low fat greek yogurt with blueberries and granola  o Egg white omelet with spinach and mushrooms o Breakfast couscous: whole wheat couscous, apricots, skim milk, cranberries  . Sandwiches:  o Hummus and grilled vegetables (peppers, zucchini, squash) on whole wheat bread   o Grilled chicken on whole wheat pita with lettuce, tomatoes, cucumbers or tzatziki  o Tuna salad on whole wheat bread: tuna salad made with greek yogurt, olives, red peppers, capers, green onions o Garlic rosemary lamb pita: lamb sauted with garlic, rosemary, salt & pepper; add lettuce, cucumber, greek yogurt to pita - flavor with lemon juice and black pepper  . Seafood:  o Mediterranean grilled salmon, seasoned with garlic, basil, parsley, lemon juice and black pepper o Shrimp, lemon, and spinach whole-grain pasta salad made with low fat greek yogurt  o Seared scallops with lemon orzo  o Seared tuna steaks seasoned salt, pepper, coriander topped with tomato mixture of olives, tomatoes, olive oil, minced garlic, parsley, green onions and cappers  . Meats:  o Herbed greek chicken salad with kalamata olives,  cucumber, feta  o Red bell peppers stuffed  with spinach, bulgur, lean ground beef (or lentils) & topped with feta   o Kebabs: skewers of chicken, tomatoes, onions, zucchini, squash  o [place] burgers: made with red onions, mint, dill, lemon juice, feta cheese topped with roasted red peppers . Vegetarian o Cucumber salad: cucumbers, artichoke hearts, celery, red onion, feta cheese, tossed in olive oil & lemon juice  o Hummus and whole grain pita points with a greek salad (lettuce, tomato, feta, olives, cucumbers, red onion) o Lentil soup with celery, carrots made with vegetable broth, garlic, salt and pepper  o Tabouli salad: parsley, bulgur, mint, scallions, cucumbers, tomato, radishes, lemon juice, olive oil, salt and pepper.         Standley Brooking. Sarina Robleto M.D.

## 2015-02-10 NOTE — Patient Instructions (Signed)
Intensify lifestyle interventions. Labs are now in diabetes range . No sugars in beverages processed carbs .  Weight  loss will help  Add metformin 500 mg xr once a day also. Continue bp medication good control .  Lab pre visit in 3-4 months  PSA hga1c, celiac ,  cbcdiff ,urinalysis  With protein screen .  Skin evaluation at [facility]  [address] [place], [place] [address] [phone]       Why follow it? Research shows. . Those who follow the Mediterranean diet have a reduced risk of heart disease  . The diet is associated with a reduced incidence of Parkinson's and Alzheimer's diseases . People following the diet may have longer life expectancies and lower rates of chronic diseases  . The Dietary Guidelines for Americans recommends the Mediterranean diet as an eating plan to promote health and prevent disease  What Is the Mediterranean Diet?  . Healthy eating plan based on typical foods and recipes of Mediterranean-style cooking . The diet is primarily a plant based diet; these foods should make up a majority of meals   Starches - Plant based foods should make up a majority of meals - They are an important sources of vitamins, minerals, energy, antioxidants, and fiber - Choose whole grains, foods high in fiber and minimally processed items  - Typical grain sources include wheat, oats, barley, corn, brown rice, bulgar, farro, millet, polenta, couscous  - Various types of beans include chickpeas, lentils, fava beans, black beans, white beans   Fruits  Veggies - Large quantities of antioxidant rich fruits & veggies; 6 or more servings  - Vegetables can be eaten raw or lightly drizzled with oil and cooked  - Vegetables common to the traditional Mediterranean Diet include: artichokes, arugula, beets, broccoli, brussel sprouts, cabbage, carrots, celery, collard greens, cucumbers, eggplant, kale, leeks, lemons, lettuce, mushrooms, okra, onions, peas,  peppers, potatoes, pumpkin, radishes, rutabaga, shallots, spinach, sweet potatoes, turnips, zucchini - Fruits common to the Mediterranean Diet include: apples, apricots, avocados, cherries, clementines, dates, figs, grapefruits, grapes, melons, nectarines, oranges, peaches, pears, pomegranates, strawberries, tangerines  Fats - Replace butter and margarine with healthy oils, such as olive oil, canola oil, and tahini  - Limit nuts to no more than a handful a day  - Nuts include walnuts, almonds, pecans, pistachios, pine nuts  - Limit or avoid candied, honey roasted or heavily salted nuts - Olives are central to the [employer] - can be eaten whole or used in a variety of dishes   Meats Protein - Limiting red meat: no more than a few times a month - When eating red meat: choose lean cuts and keep the portion to the size of deck of cards - Eggs: approx. 0 to 4 times a week  - Fish and lean poultry: at least 2 a week  - Healthy protein sources include, chicken, [place], lean beef, lamb - Increase intake of seafood such as tuna, salmon, trout, mackerel, shrimp, scallops - Avoid or limit high fat processed meats such as sausage and bacon  Dairy - Include moderate amounts of low fat dairy products  - Focus on healthy dairy such as fat free yogurt, skim milk, low or reduced fat cheese - Limit dairy products higher in fat such as whole or 2% milk, cheese, ice cream  Alcohol - Moderate amounts of red wine is ok  - No more than 5 oz daily for women (all ages) and men older than age  65  - No more than 10 oz of wine daily for men younger than 38  Other - Limit sweets and other desserts  - Use herbs and spices instead of salt to flavor foods  - Herbs and spices common to the traditional Mediterranean Diet include: basil, bay leaves, chives, cloves, cumin, fennel, garlic, lavender, marjoram, mint, oregano, parsley, pepper, rosemary, sage, savory, sumac, tarragon, thyme   It's not just a diet, it's a  lifestyle:  . The Mediterranean diet includes lifestyle factors typical of those in the region  . Foods, drinks and meals are best eaten with others and savored . Daily physical activity is important for overall good health . This could be strenuous exercise like running and aerobics . This could also be more leisurely activities such as walking, housework, yard-work, or taking the stairs . Moderation is the key; a balanced and healthy diet accommodates most foods and drinks . Consider portion sizes and frequency of consumption of certain foods   Meal Ideas & Options:  . Breakfast:  o Whole wheat toast or whole wheat English muffins with peanut butter & hard boiled egg o Steel cut oats topped with apples & cinnamon and skim milk  o Fresh fruit: banana, strawberries, melon, berries, peaches  o Smoothies: strawberries, bananas, greek yogurt, peanut butter o Low fat greek yogurt with blueberries and granola  o Egg white omelet with spinach and mushrooms o Breakfast couscous: whole wheat couscous, apricots, skim milk, cranberries  . Sandwiches:  o Hummus and grilled vegetables (peppers, zucchini, squash) on whole wheat bread   o Grilled chicken on whole wheat pita with lettuce, tomatoes, cucumbers or tzatziki  o Tuna salad on whole wheat bread: tuna salad made with greek yogurt, olives, red peppers, capers, green onions o Garlic rosemary lamb pita: lamb sauted with garlic, rosemary, salt & pepper; add lettuce, cucumber, greek yogurt to pita - flavor with lemon juice and black pepper  . Seafood:  o Mediterranean grilled salmon, seasoned with garlic, basil, parsley, lemon juice and black pepper o Shrimp, lemon, and spinach whole-grain pasta salad made with low fat greek yogurt  o Seared scallops with lemon orzo  o Seared tuna steaks seasoned salt, pepper, coriander topped with tomato mixture of olives, tomatoes, olive oil, minced garlic, parsley, green onions and cappers  . Meats:  o Herbed  greek chicken salad with kalamata olives, cucumber, feta  o Red bell peppers stuffed with spinach, bulgur, lean ground beef (or lentils) & topped with feta   o Kebabs: skewers of chicken, tomatoes, onions, zucchini, squash  o [place] burgers: made with red onions, mint, dill, lemon juice, feta cheese topped with roasted red peppers . Vegetarian o Cucumber salad: cucumbers, artichoke hearts, celery, red onion, feta cheese, tossed in olive oil & lemon juice  o Hummus and whole grain pita points with a greek salad (lettuce, tomato, feta, olives, cucumbers, red onion) o Lentil soup with celery, carrots made with vegetable broth, garlic, salt and pepper  o Tabouli salad: parsley, bulgur, mint, scallions, cucumbers, tomato, radishes, lemon juice, olive oil, salt and pepper.

## 2015-02-11 ENCOUNTER — Telehealth: Payer: Self-pay | Admitting: Internal Medicine

## 2015-02-11 NOTE — Telephone Encounter (Signed)
emmi emailed °

## 2015-03-06 ENCOUNTER — Other Ambulatory Visit: Payer: Self-pay | Admitting: Internal Medicine

## 2015-03-06 NOTE — Telephone Encounter (Signed)
Sent to the pharmacy by e-scribe. 

## 2015-06-01 ENCOUNTER — Other Ambulatory Visit (INDEPENDENT_AMBULATORY_CARE_PROVIDER_SITE_OTHER): Payer: BLUE CROSS/BLUE SHIELD

## 2015-06-01 ENCOUNTER — Other Ambulatory Visit: Payer: Self-pay

## 2015-06-01 ENCOUNTER — Other Ambulatory Visit: Payer: Self-pay | Admitting: Family Medicine

## 2015-06-01 DIAGNOSIS — D582 Other hemoglobinopathies: Secondary | ICD-10-CM | POA: Diagnosis not present

## 2015-06-01 DIAGNOSIS — Z125 Encounter for screening for malignant neoplasm of prostate: Secondary | ICD-10-CM

## 2015-06-01 DIAGNOSIS — R198 Other specified symptoms and signs involving the digestive system and abdomen: Secondary | ICD-10-CM

## 2015-06-01 DIAGNOSIS — E119 Type 2 diabetes mellitus without complications: Secondary | ICD-10-CM

## 2015-06-01 LAB — HEMOGLOBIN A1C: Hgb A1c MFr Bld: 6.2 % (ref 4.6–6.5)

## 2015-06-01 LAB — POCT URINALYSIS DIPSTICK
Bilirubin, UA: NEGATIVE
Blood, UA: NEGATIVE
Glucose, UA: NEGATIVE
Ketones, UA: NEGATIVE
LEUKOCYTES UA: NEGATIVE
Nitrite, UA: NEGATIVE
PROTEIN UA: NEGATIVE
Spec Grav, UA: 1.02
Urobilinogen, UA: 0.2
pH, UA: 5.5

## 2015-06-01 LAB — CBC WITH DIFFERENTIAL/PLATELET
BASOS PCT: 0.5 % (ref 0.0–3.0)
Basophils Absolute: 0.1 10*3/uL (ref 0.0–0.1)
Eosinophils Absolute: 0.3 10*3/uL (ref 0.0–0.7)
Eosinophils Relative: 3.6 % (ref 0.0–5.0)
HEMATOCRIT: 47.4 % (ref 39.0–52.0)
HEMOGLOBIN: 16.1 g/dL (ref 13.0–17.0)
Lymphocytes Relative: 32.2 % (ref 12.0–46.0)
Lymphs Abs: 3.1 10*3/uL (ref 0.7–4.0)
MCHC: 33.9 g/dL (ref 30.0–36.0)
MCV: 91 fl (ref 78.0–100.0)
Monocytes Absolute: 1 10*3/uL (ref 0.1–1.0)
Monocytes Relative: 9.9 % (ref 3.0–12.0)
Neutro Abs: 5.2 10*3/uL (ref 1.4–7.7)
Neutrophils Relative %: 53.8 % (ref 43.0–77.0)
Platelets: 251 10*3/uL (ref 150.0–400.0)
RBC: 5.21 Mil/uL (ref 4.22–5.81)
RDW: 13.6 % (ref 11.5–15.5)
WBC: 9.7 10*3/uL (ref 4.0–10.5)

## 2015-06-01 LAB — MICROALBUMIN / CREATININE URINE RATIO
CREATININE, U: 176.3 mg/dL
MICROALB/CREAT RATIO: 0.6 mg/g (ref 0.0–30.0)
Microalb, Ur: 1 mg/dL (ref 0.0–1.9)

## 2015-06-01 LAB — PSA: PSA: 0.75 ng/mL (ref 0.10–4.00)

## 2015-06-02 LAB — CELIAC PANEL 10
Endomysial Screen: NEGATIVE
Gliadin IgA: 8 Units (ref <20)
Gliadin IgG: 2 Units (ref <20)
IgA: 219 mg/dL (ref 68–379)
Tissue Transglut Ab: 5 U/mL (ref <6)
Tissue Transglutaminase Ab, IgA: 1 U/mL (ref <4)

## 2015-06-05 ENCOUNTER — Ambulatory Visit (INDEPENDENT_AMBULATORY_CARE_PROVIDER_SITE_OTHER): Payer: BLUE CROSS/BLUE SHIELD | Admitting: Internal Medicine

## 2015-06-05 ENCOUNTER — Encounter: Payer: Self-pay | Admitting: Internal Medicine

## 2015-06-05 VITALS — BP 140/82 | Temp 97.9°F | Ht 67.25 in | Wt 209.6 lb

## 2015-06-05 DIAGNOSIS — I10 Essential (primary) hypertension: Secondary | ICD-10-CM | POA: Diagnosis not present

## 2015-06-05 DIAGNOSIS — E785 Hyperlipidemia, unspecified: Secondary | ICD-10-CM | POA: Diagnosis not present

## 2015-06-05 DIAGNOSIS — R7309 Other abnormal glucose: Secondary | ICD-10-CM

## 2015-06-05 DIAGNOSIS — R7303 Prediabetes: Secondary | ICD-10-CM

## 2015-06-05 NOTE — Progress Notes (Signed)
Pre visit review using our clinic review tool, if applicable. No additional management support is needed unless otherwise documented below in the visit note.  Chief Complaint  Patient presents with  . Follow-up    bg    HPI: Jonathan [name] Comes in for follow up of  multiple medical issues  DM pre diabetes  Last a1c in diabetic range  metfromin hasn't taken it and is just cut out processed sugars he did go on a cruise recently and didn't eat as healthy but otherwise feels good. There've been some stress tragedies related to family friends and deaths but has been trying to do the healthy option. Plans are ramping up his exercise. LIPIDS had se of med in past will need follow-up. Blood pressure has been okay did run out of his blood pressure medicine is back on it. ROS: See pertinent positives and negatives per HPI.  Past Medical History  Diagnosis Date  . Allergic rhinitis   . Anxiety   . Depression   . Hyperlipidemia   . Hypertension   . History of nephrolithiasis   . Sebaceous cyst     Family History  Problem Relation Age of Onset  . Diabetes Father   . Hypertension Father   . Kidney cancer Father     now on dialysis    History   Social History  . Marital Status: Married    Spouse Name: N/A  . Number of Children: 1  . Years of Education: N/A   Occupational History  . MINISTER    Social History Main Topics  . Smoking status: Never Smoker   . Smokeless tobacco: Never Used  . Alcohol Use: Yes     Comment: less than 1 a week  . Drug Use: No  . Sexual Activity: Yes   Other Topics Concern  . None   Social History Narrative   Married   Regular exercise- no   HH of 3   Dog car and lizard   Sleep wakening at times   About 40 hours  Per week reg sleep.     Outpatient Prescriptions Prior to Visit  Medication Sig Dispense Refill  . acetaminophen (TYLENOL) 325 MG tablet Take 650 mg by mouth every 6 (six) hours as needed.      . IBUPROFEN PO Take 400 mg by  mouth as needed.    Marland Kitchen lisinopril-hydrochlorothiazide (PRINZIDE,ZESTORETIC) 20-12.5 MG per tablet TAKE 1 TABLET BY MOUTH EVERY DAY FOR HIGH BLOOD PRESSURE 90 tablet 2  . Quercetin 250 MG TABS Take by mouth.      . metFORMIN (GLUCOPHAGE-XR) 500 MG 24 hr tablet Take 1 tablet (500 mg total) by mouth daily with breakfast. (Patient not taking: Reported on 06/05/2015) 30 tablet 5   No facility-administered medications prior to visit.     EXAM:  BP 140/82 mmHg  Temp(Src) 97.9 F (36.6 C) (Oral)  Ht 5' 7.25" (1.708 m)  Wt 209 lb 9.6 oz (95.074 kg)  BMI 32.59 kg/m2  Body mass index is 32.59 kg/(m^2).  GENERAL: vitals reviewed and listed above, alert, oriented, appears well hydrated and in no acute distress HEENT: atraumatic, conjunctiva  clear, no obvious abnormalities on inspection of external nose and ears PSYCH: pleasant and cooperative, no obvious depression or anxiety Lab Results  Component Value Date   WBC 9.7 06/01/2015   HGB 16.1 06/01/2015   HCT 47.4 06/01/2015   PLT 251.0 06/01/2015   GLUCOSE 130* 02/04/2015   CHOL 244* 02/04/2015   TRIG  177.0* 02/04/2015   HDL 43.90 02/04/2015   LDLDIRECT 190.2 12/11/2013   LDLCALC 165* 02/04/2015   ALT 33 02/04/2015   AST 20 02/04/2015   NA 138 02/04/2015   K 3.7 02/04/2015   CL 101 02/04/2015   CREATININE 1.33 02/04/2015   BUN 18 02/04/2015   CO2 28 02/04/2015   TSH 0.93 02/04/2015   PSA 0.75 06/01/2015   HGBA1C 6.2 06/01/2015   MICROALBUR 1.0 06/01/2015   BP Readings from Last 3 Encounters:  06/05/15 140/82  02/10/15 122/70  10/25/13 122/80   Wt Readings from Last 3 Encounters:  06/05/15 209 lb 9.6 oz (95.074 kg)  02/10/15 206 lb (93.441 kg)  10/25/13 210 lb 6 oz (95.425 kg)     ASSESSMENT AND PLAN:  Discussed the following assessment and plan:  New onset type 2 diabetes mellitus - now controlled level   Prediabetes  Essential hypertension .? asa -Patient advised to return or notify health care team  if symptoms  worsen ,persist or new concerns arise.  Patient Instructions  Continue lifestyle intervention healthy eating and exercise . Monitor   hga 1c every 4-  6 months for now  To make sure not going back up.  Ok to stay off the metformin for now.  Can consider   Using even in the prediabetic range.  Advise  hga1c and  Bmp. lipid panel in 4-6 months or as needed.         Standley Brooking. Panosh M.D.

## 2015-06-05 NOTE — Patient Instructions (Signed)
Continue lifestyle intervention healthy eating and exercise . Monitor   hga 1c every 4-  6 months for now  To make sure not going back up.  Ok to stay off the metformin for now.  Can consider   Using even in the prediabetic range.  Advise  hga1c and  Bmp. lipid panel in 4-6 months or as needed.

## 2015-08-31 ENCOUNTER — Other Ambulatory Visit: Payer: Self-pay | Admitting: Internal Medicine

## 2015-10-17 ENCOUNTER — Other Ambulatory Visit: Payer: Self-pay | Admitting: Internal Medicine

## 2015-10-20 NOTE — Telephone Encounter (Signed)
Sent to the pharmacy by e-scribe.  Medication sent in on 03/06/15 for 9 months.  Spoke to the pharmacy.  They did not receive that prescription.  Pt should return in March 17 for CPX.

## 2015-11-06 ENCOUNTER — Other Ambulatory Visit (INDEPENDENT_AMBULATORY_CARE_PROVIDER_SITE_OTHER): Payer: BLUE CROSS/BLUE SHIELD

## 2015-11-06 DIAGNOSIS — E785 Hyperlipidemia, unspecified: Secondary | ICD-10-CM | POA: Diagnosis not present

## 2015-11-06 DIAGNOSIS — E119 Type 2 diabetes mellitus without complications: Secondary | ICD-10-CM

## 2015-11-06 DIAGNOSIS — I10 Essential (primary) hypertension: Secondary | ICD-10-CM | POA: Diagnosis not present

## 2015-11-06 LAB — LIPID PANEL
CHOL/HDL RATIO: 6
Cholesterol: 228 mg/dL — ABNORMAL HIGH (ref 0–200)
HDL: 39.5 mg/dL (ref >39.00)
LDL Cholesterol: 158 mg/dL — ABNORMAL HIGH (ref 0–99)
NONHDL: 188.11
Triglycerides: 150 mg/dL — ABNORMAL HIGH (ref 0.0–149.0)
VLDL: 30 mg/dL (ref 0.0–40.0)

## 2015-11-06 LAB — BASIC METABOLIC PANEL
BUN: 17 mg/dL (ref 6–23)
CALCIUM: 9.5 mg/dL (ref 8.4–10.5)
CO2: 31 meq/L (ref 19–32)
CREATININE: 1.24 mg/dL (ref 0.40–1.50)
Chloride: 104 mEq/L (ref 96–112)
GFR: 64.04 mL/min (ref >60.00)
GLUCOSE: 125 mg/dL — AB (ref 70–99)
Potassium: 4.5 mEq/L (ref 3.5–5.1)
Sodium: 143 mEq/L (ref 135–145)

## 2015-11-06 LAB — HEMOGLOBIN A1C: HEMOGLOBIN A1C: 6.3 % (ref 4.6–6.5)

## 2015-11-13 ENCOUNTER — Encounter: Payer: Self-pay | Admitting: Internal Medicine

## 2015-11-13 ENCOUNTER — Ambulatory Visit (INDEPENDENT_AMBULATORY_CARE_PROVIDER_SITE_OTHER): Payer: BLUE CROSS/BLUE SHIELD | Admitting: Internal Medicine

## 2015-11-13 VITALS — BP 144/80 | Temp 98.0°F | Wt 212.5 lb

## 2015-11-13 DIAGNOSIS — R7303 Prediabetes: Secondary | ICD-10-CM

## 2015-11-13 DIAGNOSIS — I10 Essential (primary) hypertension: Secondary | ICD-10-CM

## 2015-11-13 DIAGNOSIS — E785 Hyperlipidemia, unspecified: Secondary | ICD-10-CM | POA: Diagnosis not present

## 2015-11-13 NOTE — Patient Instructions (Signed)
Make sure bp is below 140/90   130 and below is best.  For now Intensify lifestyle interventions. Recheck lipids and hga1c in 6 months and then rov  We can do this with a preventive  visit or  As an ov

## 2015-11-13 NOTE — Progress Notes (Signed)
Pre visit review using our clinic review tool, if applicable. No additional management support is needed unless otherwise documented below in the visit note.  Chief Complaint  Patient presents with  . Follow-up    HPI:  Jonathan [name] 54 y.o.  comes in for chronic disease/ medication management  BG seems ok checks ocass not taking metformin   Bp usually 130 range  Not taken med today yet L:IPIDS still working on lsi and dec wheat thinks he can do much better  Remembers se of simva  ROS: See pertinent positives and negatives per HPI.  Past Medical History  Diagnosis Date  . Allergic rhinitis   . Anxiety   . Depression   . Hyperlipidemia   . Hypertension   . History of nephrolithiasis   . Sebaceous cyst     Family History  Problem Relation Age of Onset  . Diabetes Father   . Hypertension Father   . Kidney cancer Father     now on dialysis    Social History   Social History  . Marital Status: Married    Spouse Name: N/A  . Number of Children: 1  . Years of Education: N/A   Occupational History  . MINISTER    Social History Main Topics  . Smoking status: Never Smoker   . Smokeless tobacco: Never Used  . Alcohol Use: Yes     Comment: less than 1 a week  . Drug Use: No  . Sexual Activity: Yes   Other Topics Concern  . None   Social History Narrative   Married   Regular exercise- no   HH of 3   Dog car and lizard   Sleep wakening at times   About 40 hours  Per week reg sleep.     Outpatient Prescriptions Prior to Visit  Medication Sig Dispense Refill  . acetaminophen (TYLENOL) 325 MG tablet Take 650 mg by mouth every 6 (six) hours as needed.      . IBUPROFEN PO Take 400 mg by mouth as needed.    Marland Kitchen lisinopril-hydrochlorothiazide (PRINZIDE,ZESTORETIC) 20-12.5 MG tablet TAKE 1 TABLET BY MOUTH EVERY DAY FOR HIGH BLOOD PRESSURE 90 tablet 1  . metFORMIN (GLUCOPHAGE-XR) 500 MG 24 hr tablet Take 1 tablet (500 mg total) by mouth daily with breakfast.  (Patient not taking: Reported on 06/05/2015) 30 tablet 5  . Quercetin 250 MG TABS Take by mouth.       No facility-administered medications prior to visit.     EXAM:  BP 144/80 mmHg  Temp(Src) 98 F (36.7 C) (Oral)  Wt 212 lb 8 oz (96.389 kg)  Body mass index is 33.04 kg/(m^2).  GENERAL: vitals reviewed and listed above, alert, oriented, appears well hydrated and in no acute distress HEENT: atraumatic, conjunctiva  clear, no obvious abnormalities on inspection of external nose and ears OP : no lesion edema or exudate  NECK: no obvious masses on inspection palpation  LUNGS: clear to auscultation bilaterally, no wheezes, rales or rhonchi, good air movement CV: HRRR, no clubbing cyanosis or  peripheral edema nl cap refill  MS: moves all extremities without noticeable focal  abnormality PSYCH: pleasant and cooperative, no obvious depression or anxiety Lab Results  Component Value Date   WBC 9.7 06/01/2015   HGB 16.1 06/01/2015   HCT 47.4 06/01/2015   PLT 251.0 06/01/2015   GLUCOSE 125* 11/06/2015   CHOL 228* 11/06/2015   TRIG 150.0* 11/06/2015   HDL 39.50 11/06/2015   LDLDIRECT 190.2 12/11/2013  LDLCALC 158* 11/06/2015   ALT 33 02/04/2015   AST 20 02/04/2015   NA 143 11/06/2015   K 4.5 11/06/2015   CL 104 11/06/2015   CREATININE 1.24 11/06/2015   BUN 17 11/06/2015   CO2 31 11/06/2015   TSH 0.93 02/04/2015   PSA 0.75 06/01/2015   HGBA1C 6.3 11/06/2015   MICROALBUR 1.0 06/01/2015   BP Readings from Last 3 Encounters:  11/13/15 144/80  06/05/15 140/82  02/10/15 122/70   Wt Readings from Last 3 Encounters:  11/13/15 212 lb 8 oz (96.389 kg)  06/05/15 209 lb 9.6 oz (95.074 kg)  02/10/15 206 lb (93.441 kg)     ASSESSMENT AND PLAN:  Discussed the following assessment and plan:  Prediabetes - hasnt taken the metformin lsi satble continue ok options  Essential hypertension - reports control out of office   Hyperlipidemia - only slightly better had se of simva  wants to con lsi SDM  will wait next labs cpx in 6 months or as eneded  Total visit 88mins > 50% spent counseling and coordinating care as indicated in above note and in instructions to patient .  Shared Decision Making for lipid prevention and standards of care for populations    follow with inc lsi   Family stresses as coping cpx in 6 months with a1c  -Patient advised to return or notify health care team  if symptoms worsen ,persist or new concerns arise.  Patient Instructions  Make sure bp is below 140/90   130 and below is best.  For now Intensify lifestyle interventions. Recheck lipids and hga1c in 6 months and then rov  We can do this with a preventive  visit or  As an [employer] K. Doyle Kunath M.D.

## 2016-05-10 ENCOUNTER — Other Ambulatory Visit (INDEPENDENT_AMBULATORY_CARE_PROVIDER_SITE_OTHER): Payer: BLUE CROSS/BLUE SHIELD

## 2016-05-10 DIAGNOSIS — Z0000 Encounter for general adult medical examination without abnormal findings: Secondary | ICD-10-CM | POA: Diagnosis not present

## 2016-05-10 LAB — LIPID PANEL
CHOLESTEROL: 248 mg/dL — AB (ref 0–200)
HDL: 44.4 mg/dL (ref >39.00)
LDL CALC: 177 mg/dL — AB (ref 0–99)
NonHDL: 203.2
TRIGLYCERIDES: 130 mg/dL (ref 0.0–149.0)
Total CHOL/HDL Ratio: 6
VLDL: 26 mg/dL (ref 0.0–40.0)

## 2016-05-10 LAB — HEPATIC FUNCTION PANEL
ALBUMIN: 4.1 g/dL (ref 3.5–5.2)
ALT: 17 U/L (ref 0–53)
AST: 15 U/L (ref 0–37)
Alkaline Phosphatase: 70 U/L (ref 39–117)
Bilirubin, Direct: 0.1 mg/dL (ref 0.0–0.3)
TOTAL PROTEIN: 6.1 g/dL (ref 6.0–8.3)
Total Bilirubin: 0.6 mg/dL (ref 0.2–1.2)

## 2016-05-10 LAB — HEMOGLOBIN A1C: HEMOGLOBIN A1C: 6.2 % (ref 4.6–6.5)

## 2016-05-10 LAB — CBC WITH DIFFERENTIAL/PLATELET
BASOS PCT: 0.6 % (ref 0.0–3.0)
Basophils Absolute: 0 10*3/uL (ref 0.0–0.1)
EOS PCT: 3.7 % (ref 0.0–5.0)
Eosinophils Absolute: 0.3 10*3/uL (ref 0.0–0.7)
HCT: 49.4 % (ref 39.0–52.0)
Hemoglobin: 16.8 g/dL (ref 13.0–17.0)
Lymphocytes Relative: 30.6 % (ref 12.0–46.0)
Lymphs Abs: 2.7 10*3/uL (ref 0.7–4.0)
MCHC: 34 g/dL (ref 30.0–36.0)
MCV: 87.8 fl (ref 78.0–100.0)
MONOS PCT: 9.5 % (ref 3.0–12.0)
Monocytes Absolute: 0.8 10*3/uL (ref 0.1–1.0)
NEUTROS ABS: 4.8 10*3/uL (ref 1.4–7.7)
NEUTROS PCT: 55.6 % (ref 43.0–77.0)
PLATELETS: 234 10*3/uL (ref 150.0–400.0)
RBC: 5.63 Mil/uL (ref 4.22–5.81)
RDW: 13.6 % (ref 11.5–15.5)
WBC: 8.7 10*3/uL (ref 4.0–10.5)

## 2016-05-10 LAB — BASIC METABOLIC PANEL
BUN: 15 mg/dL (ref 6–23)
CHLORIDE: 105 meq/L (ref 96–112)
CO2: 26 meq/L (ref 19–32)
Calcium: 9 mg/dL (ref 8.4–10.5)
Creatinine, Ser: 1.15 mg/dL (ref 0.40–1.50)
GFR: 69.73 mL/min (ref >60.00)
GLUCOSE: 127 mg/dL — AB (ref 70–99)
POTASSIUM: 4 meq/L (ref 3.5–5.1)
SODIUM: 139 meq/L (ref 135–145)

## 2016-05-10 LAB — TSH: TSH: 1.29 u[IU]/mL (ref 0.35–4.50)

## 2016-05-10 LAB — PSA: PSA: 0.66 ng/mL (ref 0.10–4.00)

## 2016-05-15 DIAGNOSIS — R7303 Prediabetes: Secondary | ICD-10-CM | POA: Insufficient documentation

## 2016-05-15 DIAGNOSIS — I10 Essential (primary) hypertension: Secondary | ICD-10-CM | POA: Insufficient documentation

## 2016-05-15 DIAGNOSIS — E785 Hyperlipidemia, unspecified: Secondary | ICD-10-CM | POA: Insufficient documentation

## 2016-05-15 NOTE — Progress Notes (Signed)
Pre visit review using our clinic review tool, if applicable. No additional management support is needed unless otherwise documented below in the visit note.  Chief Complaint  Patient presents with  . Annual Exam    HPI: Patient  Jonathan [name]  55 y.o. comes in today for Preventive Health Care visit  Feels pretty well right now  Bp  120 130 range .  At home taking med  No se reported   BG never took metformin  lsi exercise   Very reluctant to use medication  Never went to derm  Feels he should go no new sx   Mood doing better   sabbatical sees counselor .     Health Maintenance  Topic Date Due  . Hepatitis C Screening  1961/01/30  . PNEUMOCOCCAL POLYSACCHARIDE VACCINE (1) 08/17/1961  . OPHTHALMOLOGY EXAM  08/17/1969  . HIV Screening  08/17/1974  . FOOT EXAM  02/10/2016  . INFLUENZA VACCINE  07/05/2016  . HEMOGLOBIN A1C  11/09/2016  . TETANUS/TDAP  03/31/2020  . COLONOSCOPY  07/05/2020   Health Maintenance Review LIFESTYLE:  Exercise:    [place]  Power walking  20 minutes  For a month 3 x per week.    Tobacco/ETS: no  some ets . recenetly  Alcohol: minimal  Sugar beverages:  ocass sweet tea.  4-5  Sleep:  About  7 hours  Drug use: no  hh of 3   ROS:  GEN/ HEENT: No fever, significant weight changes sweats headaches vision problems hearing changes, CV/ PULM; No chest pain shortness of breath cough, syncope,edema  change in exercise tolerance. GI /GU: No adominal pain, vomiting, change in bowel habits. No blood in the stool. No significant GU symptoms. SKIN/HEME: ,no acute skin rashes suspicious lesions or bleeding. No lymphadenopathy, nodules, masses.  NEURO/ PSYCH:  No neurologic signs such as weakness numbness. No depression anxiety. IMM/ Allergy: No unusual infections.  Allergy .   REST of 12 system review negative except as per HPI   Past Medical History  Diagnosis Date  . Allergic rhinitis   . Anxiety   . Depression   . Hyperlipidemia   . Hypertension    . History of nephrolithiasis   . Sebaceous cyst     Past Surgical History  Procedure Laterality Date  . Tonsillectomy      x 2  . Tympanostomy tube placement    . Kidney stones removed      x 2    Family History  Problem Relation Age of Onset  . Diabetes Father   . Hypertension Father   . Kidney cancer Father     now on dialysis    Social History   Social History  . Marital Status: Married    Spouse Name: N/A  . Number of Children: 1  . Years of Education: N/A   Occupational History  . MINISTER    Social History Main Topics  . Smoking status: Never Smoker   . Smokeless tobacco: Never Used  . Alcohol Use: Yes     Comment: less than 1 a week  . Drug Use: No  . Sexual Activity: Yes   Other Topics Concern  . None   Social History Narrative   Married   Regular exercise- no   HH of 3   Dog car and lizard   Sleep wakening at times   About 40 hours  Per week reg sleep.     Outpatient Prescriptions Prior to Visit  Medication Sig Dispense  Refill  . acetaminophen (TYLENOL) 325 MG tablet Take 650 mg by mouth every 6 (six) hours as needed.      . IBUPROFEN PO Take 400 mg by mouth as needed.    Marland Kitchen lisinopril-hydrochlorothiazide (PRINZIDE,ZESTORETIC) 20-12.5 MG tablet TAKE 1 TABLET BY MOUTH EVERY DAY FOR HIGH BLOOD PRESSURE 90 tablet 1  . metFORMIN (GLUCOPHAGE-XR) 500 MG 24 hr tablet Take 1 tablet (500 mg total) by mouth daily with breakfast. (Patient not taking: Reported on 06/05/2015) 30 tablet 5  . Quercetin 250 MG TABS Take by mouth.       No facility-administered medications prior to visit.     EXAM:  BP 140/78 mmHg  Temp(Src) 98.1 F (36.7 C) (Oral)  Ht 5' 6.5" (1.689 m)  Wt 206 lb 3.2 oz (93.532 kg)  BMI 32.79 kg/m2  Body mass index is 32.79 kg/(m^2).  Physical Exam: Vital signs reviewed RUE:AVWU is a well-developed well-nourished alert cooperative    who appearsr stated age in no acute distress.  HEENT: normocephalic atraumatic , Eyes: PERRL  EOM's full, conjunctiva clear, Nares: paten,t no deformity discharge or tenderness., Ears: no deformity EAC's clear TMs with normal landmarks. Mouth: clear OP, no lesions, edema.  Moist mucous membranes. Dentition in adequate repair. NECK: supple without masses, thyromegaly or bruits. CHEST/PULM:  Clear to auscultation and percussion breath sounds equal no wheeze , rales or rhonchi. No chest wall deformities or tenderness. CV: PMI is nondisplaced, S1 S2 no gallops, murmurs, rubs. Peripheral pulses are full without delay.No JVD .  ABDOMEN: Bowel sounds normal nontender  No guard or rebound, no hepato splenomegal no CVA tenderness.  No hernia. Extremtities:  No clubbing cyanosis or edema, no acute joint swelling or redness no focal atrophy NEURO:  Oriented x3, cranial nerves 3-12 appear to be intact, no obvious focal weakness,gait within normal limits no abnormal reflexes or asymmetrical decline prostate check doing ok no se  SKIN: No acute rashes normal turgor, color, no bruising or petechiae. Right  Side of face   Scaling  And   ok PSYCH: Oriented, good eye contact, no obvious depression anxiety, cognition and judgment appear normal. LN: no cervical axillary inguinal adenopathy Diabetic Foot Exam - Simple   Simple Foot Form  Visual Inspection  No deformities, no ulcerations, no other skin breakdown bilaterally:  Yes  Sensation Testing  Intact to touch and monofilament testing bilaterally:  Yes  Pulse Check  Posterior Tibialis and Dorsalis pulse intact bilaterally:  Yes  Comments  Min callus mtp area        Lab Results  Component Value Date   WBC 8.7 05/10/2016   HGB 16.8 05/10/2016   HCT 49.4 05/10/2016   PLT 234.0 05/10/2016   GLUCOSE 127* 05/10/2016   CHOL 248* 05/10/2016   TRIG 130.0 05/10/2016   HDL 44.40 05/10/2016   LDLDIRECT 190.2 12/11/2013   LDLCALC 177* 05/10/2016   ALT 17 05/10/2016   AST 15 05/10/2016   NA 139 05/10/2016   K 4.0 05/10/2016   CL 105 05/10/2016    CREATININE 1.15 05/10/2016   BUN 15 05/10/2016   CO2 26 05/10/2016   TSH 1.29 05/10/2016   PSA 0.66 05/10/2016   HGBA1C 6.2 05/10/2016   MICROALBUR 1.0 06/01/2015   Wt Readings from Last 3 Encounters:  05/16/16 206 lb 3.2 oz (93.532 kg)  11/13/15 212 lb 8 oz (96.389 kg)  06/05/15 209 lb 9.6 oz (95.074 kg)   BP Readings from Last 3 Encounters:  05/16/16 140/78  11/13/15 144/80  06/05/15 140/82     ASSESSMENT AND PLAN:  Discussed the following assessment and plan:  Visit for preventive health examination  Prediabetes  Essential hypertension - make sure at goal below 140/90 ( he says is will monitor and get back with [place] if elevated)  Hyperlipidemia - had se simva reluctant o use medciation  counseled  Medication management  Patient Care Team: Madelin Headings, MD as PCP - General Patient Instructions   Continue lifestyle intervention healthy eating and exercise . See dermatology about the scaly areas on our face.  I still advise you take the metformin .   Suggest you try another statin medication  For your cholesterol.   If  You get a side effect should stop it.    Health Maintenance, Male A healthy lifestyle and preventative care can promote health and wellness.  Maintain regular health, dental, and eye exams.  Eat a healthy diet. Foods like vegetables, fruits, whole grains, low-fat dairy products, and lean protein foods contain the nutrients you need and are low in calories. Decrease your intake of foods high in solid fats, added sugars, and salt. Get information about a proper diet from your health care provider, if necessary.  Regular physical exercise is one of the most important things you can do for your health. Most adults should get at least 150 minutes of moderate-intensity exercise (any activity that increases your heart rate and causes you to sweat) each week. In addition, most adults need muscle-strengthening exercises on 2 or more days a week.    Maintain a healthy weight. The body mass index (BMI) is a screening tool to identify possible weight problems. It provides an estimate of body fat based on height and weight. Your health care provider can find your BMI and can help you achieve or maintain a healthy weight. For males 20 years and older:  A BMI below 18.5 is considered underweight.  A BMI of 18.5 to 24.9 is normal.  A BMI of 25 to 29.9 is considered overweight.  A BMI of 30 and above is considered obese.  Maintain normal blood lipids and cholesterol by exercising and minimizing your intake of saturated fat. Eat a balanced diet with plenty of fruits and vegetables. Blood tests for lipids and cholesterol should begin at age 51 and be repeated every 5 years. If your lipid or cholesterol levels are high, you are over age 103, or you are at high risk for heart disease, you may need your cholesterol levels checked more frequently.Ongoing high lipid and cholesterol levels should be treated with medicines if diet and exercise are not working.  If you smoke, find out from your health care provider how to quit. If you do not use tobacco, do not start.  Lung cancer screening is recommended for adults aged 55-80 years who are at high risk for developing lung cancer because of a history of smoking. A yearly low-dose CT scan of the lungs is recommended for people who have at least a 30-pack-year history of smoking and are current smokers or have quit within the past 15 years. A pack year of smoking is smoking an average of 1 pack of cigarettes a day for 1 year (for example, a 30-pack-year history of smoking could mean smoking 1 pack a day for 30 years or 2 packs a day for 15 years). Yearly screening should continue until the smoker has stopped smoking for at least 15 years. Yearly screening should be stopped for people who develop a health  problem that would prevent them from having lung cancer treatment.  If you choose to drink alcohol, do not  have more than 2 drinks per day. One drink is considered to be 12 oz (360 mL) of beer, 5 oz (150 mL) of wine, or 1.5 oz (45 mL) of liquor.  Avoid the use of street drugs. Do not share needles with anyone. Ask for help if you need support or instructions about stopping the use of drugs.  High blood pressure causes heart disease and increases the risk of stroke. High blood pressure is more likely to develop in:  People who have blood pressure in the end of the normal range (100-139/85-89 mm Hg).  People who are overweight or obese.  People who are African American.  If you are 91-50 years of age, have your blood pressure checked every 3-5 years. If you are 17 years of age or older, have your blood pressure checked every year. You should have your blood pressure measured twice--once when you are at a hospital or clinic, and once when you are not at a hospital or clinic. Record the average of the two measurements. To check your blood pressure when you are not at a hospital or clinic, you can use:  An automated blood pressure machine at a pharmacy.  A home blood pressure monitor.  If you are 62-56 years old, ask your health care provider if you should take aspirin to prevent heart disease.  Diabetes screening involves taking a blood sample to check your fasting blood sugar level. This should be done once every 3 years after age 28 if you are at a normal weight and without risk factors for diabetes. Testing should be considered at a younger age or be carried out more frequently if you are overweight and have at least 1 risk factor for diabetes.  Colorectal cancer can be detected and often prevented. Most routine colorectal cancer screening begins at the age of 58 and continues through age 39. However, your health care provider may recommend screening at an earlier age if you have risk factors for colon cancer. On a yearly basis, your health care provider may provide home test kits to check for hidden  blood in the stool. A small camera at the end of a tube may be used to directly examine the colon (sigmoidoscopy or colonoscopy) to detect the earliest forms of colorectal cancer. Talk to your health care provider about this at age 69 when routine screening begins. A direct exam of the colon should be repeated every 5-10 years through age 87, unless early forms of precancerous polyps or small growths are found.  People who are at an increased risk for hepatitis B should be screened for this virus. You are considered at high risk for hepatitis B if:  You were born in a country where hepatitis B occurs often. Talk with your health care provider about which countries are considered high risk.  Your parents were born in a high-risk country and you have not received a shot to protect against hepatitis B (hepatitis B vaccine).  You have HIV or AIDS.  You use needles to inject street drugs.  You live with, or have sex with, someone who has hepatitis B.  You are a man who has sex with other men (MSM).  You get hemodialysis treatment.  You take certain medicines for conditions like cancer, organ transplantation, and autoimmune conditions.  Hepatitis C blood testing is recommended for all people born from [birth year] through  1967 and any individual with known risk factors for hepatitis C.  Healthy men should no longer receive prostate-specific antigen (PSA) blood tests as part of routine cancer screening. Talk to your health care provider about prostate cancer screening.  Testicular cancer screening is not recommended for adolescents or adult males who have no symptoms. Screening includes self-exam, a health care provider exam, and other screening tests. Consult with your health care provider about any symptoms you have or any concerns you have about testicular cancer.  Practice safe sex. Use condoms and avoid high-risk sexual practices to reduce the spread of sexually transmitted infections (STIs).  You  should be screened for STIs, including gonorrhea and chlamydia if:  You are sexually active and are younger than 24 years.  You are older than 24 years, and your health care provider tells you that you are at risk for this type of infection.  Your sexual activity has changed since you were last screened, and you are at an increased risk for chlamydia or gonorrhea. Ask your health care provider if you are at risk.  If you are at risk of being infected with HIV, it is recommended that you take a prescription medicine daily to prevent HIV infection. This is called pre-exposure prophylaxis (PrEP). You are considered at risk if:  You are a man who has sex with other men (MSM).  You are a heterosexual man who is sexually active with multiple partners.  You take drugs by injection.  You are sexually active with a partner who has HIV.  Talk with your health care provider about whether you are at high risk of being infected with HIV. If you choose to begin PrEP, you should first be tested for HIV. You should then be tested every 3 months for as long as you are taking PrEP.  Use sunscreen. Apply sunscreen liberally and repeatedly throughout the day. You should seek shade when your shadow is shorter than you. Protect yourself by wearing long sleeves, pants, a wide-brimmed hat, and sunglasses year round whenever you are outdoors.  Tell your health care provider of new moles or changes in moles, especially if there is a change in shape or color. Also, tell your health care provider if a mole is larger than the size of a pencil eraser.  A one-time screening for abdominal aortic aneurysm (AAA) and surgical repair of large AAAs by ultrasound is recommended for men aged 65-75 years who are current or former smokers.  Stay current with your vaccines (immunizations).   This information is not intended to replace advice given to you by your health care provider. Make sure you discuss any questions you have  with your health care provider.   Document Released: 05/19/2008 Document Revised: 12/12/2014 Document Reviewed: 04/18/2011 Elsevier Interactive Patient Education [employer]     [place]. Talea Manges M.D.

## 2016-05-16 ENCOUNTER — Encounter: Payer: Self-pay | Admitting: Internal Medicine

## 2016-05-16 ENCOUNTER — Ambulatory Visit (INDEPENDENT_AMBULATORY_CARE_PROVIDER_SITE_OTHER): Payer: BLUE CROSS/BLUE SHIELD | Admitting: Internal Medicine

## 2016-05-16 VITALS — BP 140/78 | Temp 98.1°F | Ht 66.5 in | Wt 206.2 lb

## 2016-05-16 DIAGNOSIS — Z79899 Other long term (current) drug therapy: Secondary | ICD-10-CM | POA: Diagnosis not present

## 2016-05-16 DIAGNOSIS — E785 Hyperlipidemia, unspecified: Secondary | ICD-10-CM

## 2016-05-16 DIAGNOSIS — R7303 Prediabetes: Secondary | ICD-10-CM | POA: Diagnosis not present

## 2016-05-16 DIAGNOSIS — I10 Essential (primary) hypertension: Secondary | ICD-10-CM

## 2016-05-16 DIAGNOSIS — Z0000 Encounter for general adult medical examination without abnormal findings: Secondary | ICD-10-CM

## 2016-05-16 DIAGNOSIS — Z0001 Encounter for general adult medical examination with abnormal findings: Secondary | ICD-10-CM

## 2016-05-16 NOTE — Assessment & Plan Note (Signed)
A1c has not progressed weight has come down with lifestyle. We did have a discussion about metformin his wife is on this. I suggest he started but will make a decision and call us for a prescription to be sent in. Discussed progression of diabetes and resistance to treatment the longer there years of hyperglycemia. He will think about it. Recheck in 6 months can do by POCT hg a1c at that time

## 2016-05-16 NOTE — Patient Instructions (Addendum)
Continue lifestyle intervention healthy eating and exercise . See dermatology about the scaly areas on our face.  I still advise you take the metformin .   Suggest you try another statin medication  For your cholesterol.   If  You get a side effect should stop it.    Health Maintenance, Male A healthy lifestyle and preventative care can promote health and wellness.  Maintain regular health, dental, and eye exams.  Eat a healthy diet. Foods like vegetables, fruits, whole grains, low-fat dairy products, and lean protein foods contain the nutrients you need and are low in calories. Decrease your intake of foods high in solid fats, added sugars, and salt. Get information about a proper diet from your health care provider, if necessary.  Regular physical exercise is one of the most important things you can do for your health. Most adults should get at least 150 minutes of moderate-intensity exercise (any activity that increases your heart rate and causes you to sweat) each week. In addition, most adults need muscle-strengthening exercises on 2 or more days a week.   Maintain a healthy weight. The body mass index (BMI) is a screening tool to identify possible weight problems. It provides an estimate of body fat based on height and weight. Your health care provider can find your BMI and can help you achieve or maintain a healthy weight. For males 20 years and older:  A BMI below 18.5 is considered underweight.  A BMI of 18.5 to 24.9 is normal.  A BMI of 25 to 29.9 is considered overweight.  A BMI of 30 and above is considered obese.  Maintain normal blood lipids and cholesterol by exercising and minimizing your intake of saturated fat. Eat a balanced diet with plenty of fruits and vegetables. Blood tests for lipids and cholesterol should begin at age 36 and be repeated every 5 years. If your lipid or cholesterol levels are high, you are over age 9, or you are at high risk for heart disease,  you may need your cholesterol levels checked more frequently.Ongoing high lipid and cholesterol levels should be treated with medicines if diet and exercise are not working.  If you smoke, find out from your health care provider how to quit. If you do not use tobacco, do not start.  Lung cancer screening is recommended for adults aged 18-80 years who are at high risk for developing lung cancer because of a history of smoking. A yearly low-dose CT scan of the lungs is recommended for people who have at least a 30-pack-year history of smoking and are current smokers or have quit within the past 15 years. A pack year of smoking is smoking an average of 1 pack of cigarettes a day for 1 year (for example, a 30-pack-year history of smoking could mean smoking 1 pack a day for 30 years or 2 packs a day for 15 years). Yearly screening should continue until the smoker has stopped smoking for at least 15 years. Yearly screening should be stopped for people who develop a health problem that would prevent them from having lung cancer treatment.  If you choose to drink alcohol, do not have more than 2 drinks per day. One drink is considered to be 12 oz (360 mL) of beer, 5 oz (150 mL) of wine, or 1.5 oz (45 mL) of liquor.  Avoid the use of street drugs. Do not share needles with anyone. Ask for help if you need support or instructions about stopping the use of  drugs.  High blood pressure causes heart disease and increases the risk of stroke. High blood pressure is more likely to develop in:  People who have blood pressure in the end of the normal range (100-139/85-89 mm Hg).  People who are overweight or obese.  People who are African American.  If you are 30-66 years of age, have your blood pressure checked every 3-5 years. If you are 51 years of age or older, have your blood pressure checked every year. You should have your blood pressure measured twice--once when you are at a hospital or clinic, and once when  you are not at a hospital or clinic. Record the average of the two measurements. To check your blood pressure when you are not at a hospital or clinic, you can use:  An automated blood pressure machine at a pharmacy.  A home blood pressure monitor.  If you are 67-60 years old, ask your health care provider if you should take aspirin to prevent heart disease.  Diabetes screening involves taking a blood sample to check your fasting blood sugar level. This should be done once every 3 years after age 7 if you are at a normal weight and without risk factors for diabetes. Testing should be considered at a younger age or be carried out more frequently if you are overweight and have at least 1 risk factor for diabetes.  Colorectal cancer can be detected and often prevented. Most routine colorectal cancer screening begins at the age of 36 and continues through age 57. However, your health care provider may recommend screening at an earlier age if you have risk factors for colon cancer. On a yearly basis, your health care provider may provide home test kits to check for hidden blood in the stool. A small camera at the end of a tube may be used to directly examine the colon (sigmoidoscopy or colonoscopy) to detect the earliest forms of colorectal cancer. Talk to your health care provider about this at age 11 when routine screening begins. A direct exam of the colon should be repeated every 5-10 years through age 76, unless early forms of precancerous polyps or small growths are found.  People who are at an increased risk for hepatitis B should be screened for this virus. You are considered at high risk for hepatitis B if:  You were born in a country where hepatitis B occurs often. Talk with your health care provider about which countries are considered high risk.  Your parents were born in a high-risk country and you have not received a shot to protect against hepatitis B (hepatitis B vaccine).  You have HIV  or AIDS.  You use needles to inject street drugs.  You live with, or have sex with, someone who has hepatitis B.  You are a man who has sex with other men (MSM).  You get hemodialysis treatment.  You take certain medicines for conditions like cancer, organ transplantation, and autoimmune conditions.  Hepatitis C blood testing is recommended for all people born from [birth year] through 1967 and any individual with known risk factors for hepatitis C.  Healthy men should no longer receive prostate-specific antigen (PSA) blood tests as part of routine cancer screening. Talk to your health care provider about prostate cancer screening.  Testicular cancer screening is not recommended for adolescents or adult males who have no symptoms. Screening includes self-exam, a health care provider exam, and other screening tests. Consult with your health care provider about any symptoms you have  or any concerns you have about testicular cancer.  Practice safe sex. Use condoms and avoid high-risk sexual practices to reduce the spread of sexually transmitted infections (STIs).  You should be screened for STIs, including gonorrhea and chlamydia if:  You are sexually active and are younger than 24 years.  You are older than 24 years, and your health care provider tells you that you are at risk for this type of infection.  Your sexual activity has changed since you were last screened, and you are at an increased risk for chlamydia or gonorrhea. Ask your health care provider if you are at risk.  If you are at risk of being infected with HIV, it is recommended that you take a prescription medicine daily to prevent HIV infection. This is called pre-exposure prophylaxis (PrEP). You are considered at risk if:  You are a man who has sex with other men (MSM).  You are a heterosexual man who is sexually active with multiple partners.  You take drugs by injection.  You are sexually active with a partner who has  HIV.  Talk with your health care provider about whether you are at high risk of being infected with HIV. If you choose to begin PrEP, you should first be tested for HIV. You should then be tested every 3 months for as long as you are taking PrEP.  Use sunscreen. Apply sunscreen liberally and repeatedly throughout the day. You should seek shade when your shadow is shorter than you. Protect yourself by wearing long sleeves, pants, a wide-brimmed hat, and sunglasses year round whenever you are outdoors.  Tell your health care provider of new moles or changes in moles, especially if there is a change in shape or color. Also, tell your health care provider if a mole is larger than the size of a pencil eraser.  A one-time screening for abdominal aortic aneurysm (AAA) and surgical repair of large AAAs by ultrasound is recommended for men aged 32-75 years who are current or former smokers.  Stay current with your vaccines (immunizations).   This information is not intended to replace advice given to you by your health care provider. Make sure you discuss any questions you have with your health care provider.   Document Released: 05/19/2008 Document Revised: 12/12/2014 Document Reviewed: 04/18/2011 Elsevier Interactive Patient Education [employer].

## 2016-09-22 ENCOUNTER — Other Ambulatory Visit: Payer: Self-pay | Admitting: Family Medicine

## 2016-09-22 MED ORDER — LISINOPRIL-HYDROCHLOROTHIAZIDE 20-12.5 MG PO TABS: 1.0000 | ORAL_TABLET | Freq: Every day | ORAL | 0 refills | Status: DC

## 2016-09-22 NOTE — Telephone Encounter (Signed)
Sent to the pharmacy by e-scribe for 90 days.  Pt has upcoming scheduled appt on 11/16/16.

## 2016-11-07 ENCOUNTER — Ambulatory Visit: Payer: BLUE CROSS/BLUE SHIELD | Admitting: Internal Medicine

## 2016-11-16 ENCOUNTER — Ambulatory Visit: Payer: BLUE CROSS/BLUE SHIELD | Admitting: Internal Medicine

## 2017-02-06 ENCOUNTER — Other Ambulatory Visit: Payer: Self-pay | Admitting: Internal Medicine

## 2017-05-23 NOTE — Progress Notes (Signed)
Chief Complaint  Patient presents with  . Annual Exam    HPI: Patient  Jonathan [name]  56 y.o. comes in today for Preventive Health Care visit  And Chronic disease management    Bp not checking has been ok  Dentist  130/80   Daily med   Not checking bg much .  Doing ok   Some faitgue l;ast month no energy and off   Dec libido disc  Taking supplement from chiropractor and feels some better asks about testosterone evaluation if needed .  utd on colon screening   Feels that is sweat is different   Health Maintenance  Topic Date Due  . PNEUMOCOCCAL POLYSACCHARIDE VACCINE (1) 08/17/1961  . OPHTHALMOLOGY EXAM  08/17/1969  . FOOT EXAM  02/10/2016  . HEMOGLOBIN A1C  11/09/2016  . Hepatitis C Screening  05/24/2018 (Originally [birth year])  . HIV Screening  05/24/2018 (Originally 08/17/1974)  . INFLUENZA VACCINE  07/05/2017  . TETANUS/TDAP  03/31/2020  . 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 Had a reactoin to underarm deodorant .  Stopped .   seweats differently .  GEN/ HEENT: No fever, significant weight changes sweats headaches vision problems hearing changes, CV/ PULM; No chest pain shortness of breath cough, syncope,edema  change in exercise tolerance. GI /GU: No adominal pain, vomiting, change in bowel habits. No blood in the stool. No significant GU symptoms. SKIN/HEME: ,no acute skin rashes suspicious lesions or bleeding. No lymphadenopathy, nodules, masses.  NEURO/ PSYCH:  No neurologic signs such as weakness numbness. No depression anxiety. IMM/ Allergy: No unusual infections.  Allergy .   REST of 12 system review negative except as per HPI   Past Medical History:  Diagnosis Date  . Allergic  rhinitis   . Anxiety   . Depression   . History of nephrolithiasis   . Hyperlipidemia   . Hypertension   . Sebaceous cyst     Past Surgical History:  Procedure Laterality Date  . kidney stones removed     x 2  . TONSILLECTOMY     x 2  . TYMPANOSTOMY TUBE PLACEMENT      Family History  Problem Relation Age of Onset  . Diabetes Father   . Hypertension Father   . Kidney cancer Father        now on dialysis    Social History   Social History  . Marital status: Married    Spouse name: N/A  . Number of children: 1  . Years of education: N/A   Occupational History  . 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 Smoking status: Never Smoker  . Smokeless tobacco: Never Used  . Alcohol use Yes     Comment: less than 1 a week  . Drug use: No  . Sexual activity: Yes   Other Topics Concern  . None   Social History Narrative   Married   Regular exercise- no   HH of 3   Dog car and lizard   Sleep wakening at times   About 40 hours  Per week reg sleep.  Outpatient Medications Prior to Visit  Medication Sig Dispense Refill  . acetaminophen (TYLENOL) 325 MG tablet Take 650 mg by mouth every 6 (six) hours as needed.      . IBUPROFEN PO Take 400 mg by mouth as needed.    Marland Kitchen lisinopril-hydrochlorothiazide (PRINZIDE,ZESTORETIC) 20-12.5 MG tablet TAKE 1 TABLET BY MOUTH DAILY FOR HIGH BLOOD PRESSURE 90 tablet 2   No facility-administered medications prior to visit.      EXAM:  BP 130/90 (BP Location: Right Arm, Patient Position: Sitting, Cuff Size: Normal)   Pulse 90   Temp 98.3 F (36.8 C) (Oral)   Ht 5\' 7"  (1.702 m)   Wt 207 lb 9.6 oz (94.2 kg)   BMI 32.51 kg/m   Body mass index is 32.51 kg/m. Wt Readings from Last 3 Encounters:  05/24/17 207 lb 9.6 oz (94.2 kg)  05/16/16 206 lb 3.2 oz (93.5 kg)  11/13/15 212 lb 8 oz (96.4 kg)    Physical Exam: Vital signs reviewed [id number]:[id number] is a well-developed well-nourished alert cooperative    who  appearsr stated age in no acute distress.  HEENT: normocephalic atraumatic , Eyes: PERRL EOM's full, conjunctiva clear, Nares: paten,t no deformity discharge or tenderness., Ears: no deformity EAC's clear TMs with normal landmarks. Mouth: clear OP, no lesions, edema.  Moist mucous membranes. Dentition in adequate repair. NECK: supple without masses, thyromegaly or bruits. CHEST/PULM:  Clear to auscultation and percussion breath sounds equal no wheeze , rales or rhonchi. No chest wall deformities or tenderness. Breast: normal by inspection . No dimpling, discharge, masses, CV: PMI is nondisplaced, S1 S2 no gallops, murmurs, rubs. Peripheral pulses are full without delay.No JVD .  ABDOMEN: Bowel sounds normal nontender  No guard or rebound, no hepato splenomegal no CVA tenderness.  No hernia. Extremtities:  No clubbing cyanosis or edema, no acute joint swelling or redness no focal atrophy NEURO:  Oriented x3, cranial nerves 3-12 appear to be intact, no obvious focal weakness,gait within normal limits no abnormal reflexes or asymmetrical SKIN: No acute rashes normal turgor, color, no bruising or petechiae. Sun  Changes   ak s  Arms   sks also  PSYCH: Oriented, good eye contact, no obvious depression anxiety, cognition and judgment appear normal. LN: no cervical axillary  adenopathy   Wt Readings from Last 3 Encounters:  05/24/17 207 lb 9.6 oz (94.2 kg)  05/16/16 206 lb 3.2 oz (93.5 kg)  11/13/15 212 lb 8 oz (96.4 kg)     BP Readings from Last 3 Encounters:  05/24/17 130/90  05/16/16 140/78  11/13/15 (!) 144/80    Lab results reviewed with patient   ASSESSMENT AND PLAN:  Discussed the following assessment and plan:  Visit for preventive health examination - Plan: Basic metabolic panel, CBC with Differential/Platelet, Hemoglobin A1c, Hepatic function panel, Lipid panel, PSA, TSH  Essential hypertension - uncetain control say at nl at dentist office  cont [employer] inc dose if  not  controlled at home - Plan: Basic metabolic panel, CBC with Differential/Platelet, Hemoglobin A1c, Hepatic function panel, Lipid panel, PSA, TSH  Hyperlipidemia, unspecified hyperlipidemia type - Plan: Basic metabolic panel, CBC with Differential/Platelet, Hemoglobin A1c, Hepatic function panel, Lipid panel, PSA, TSH  Hyperglycemia - Plan: Basic metabolic panel, CBC with Differential/Platelet, Hemoglobin A1c, Hepatic function panel, Lipid panel, PSA, TSH  Prediabetes - Plan: Basic metabolic panel, CBC with Differential/Platelet, Hemoglobin A1c, Hepatic function panel, Lipid panel, PSA, TSH  Medication management - Plan: Basic metabolic panel, CBC with Differential/Platelet, Hemoglobin A1c,  Hepatic function panel, Lipid panel, PSA, TSH  Screening PSA (prostate specific antigen) - Plan: PSA  Decreased libido has taken supplements for libido   and gi sx in past recent ,   Shared Decision Making for psa screening no sx .   Told to be Caution iwhtsupplements as unknown  In past has had se statin but  Consider another if   Lab indicate . Is v ery cautious with taking meds    rov 6 month   Or if needed a1c at that time Patient Care Team: Madelin Headings, MD as PCP - General Patient Instructions  Intensify lifestyle interventions.by adding exercise as tolerated   BP goal is now  120/80 range  And if not there we can  Increase  intensity of  Medication    BP control and Cv risk factors important . Healthy weight loss will help.  Poss you fatigue and metabolic factors .   See dermatology for the multiple skin areas  That could be pre cancers or sun changes .   If ongoing concerns let us know and we can check an am testosterone   Screening.     Mediterranean Diet A Mediterranean diet refers to food and lifestyle choices that are based on the traditions of countries located on the [employer]. This way of eating has been shown to help prevent certain conditions and improve outcomes for  people who have chronic diseases, like kidney disease and heart disease. What are tips for following this plan? Lifestyle  Cook and eat meals together with your family, when possible.  Drink enough fluid to keep your urine clear or pale yellow.  Be physically active every day. This includes: ? Aerobic exercise like running or swimming. ? Leisure activities like gardening, walking, or housework.  Get 7-8 hours of sleep each night.  If recommended by your health care provider, drink red wine in moderation. This means 1 glass a day for nonpregnant women and 2 glasses a day for men. A glass of wine equals 5 oz (150 mL). Reading food labels  Check the serving size of packaged foods. For foods such as rice and pasta, the serving size refers to the amount of cooked product, not dry.  Check the total fat in packaged foods. Avoid foods that have saturated fat or trans fats.  Check the ingredients list for added sugars, such as corn syrup. Shopping  At the grocery store, buy most of your food from the areas near the walls of the store. This includes: ? Fresh fruits and vegetables (produce). ? Grains, beans, nuts, and seeds. Some of these may be available in unpackaged forms or large amounts (in bulk). ? Fresh seafood. ? Poultry and eggs. ? Low-fat dairy products.  Buy whole ingredients instead of prepackaged foods.  Buy fresh fruits and vegetables in-season from local farmers markets.  Buy frozen fruits and vegetables in resealable bags.  If you do not have access to quality fresh seafood, buy precooked frozen shrimp or canned fish, such as tuna, salmon, or sardines.  Buy small amounts of raw or cooked vegetables, salads, or olives from the deli or salad bar at your store.  Stock your pantry so you always have certain foods on hand, such as olive oil, canned tuna, canned tomatoes, rice, pasta, and beans. Cooking  Cook foods with extra-virgin olive oil instead of using butter or  other vegetable oils.  Have meat as a side dish, and have vegetables or grains as your main  dish. This means having meat in small portions or adding small amounts of meat to foods like pasta or stew.  Use beans or vegetables instead of meat in common dishes like chili or lasagna.  Experiment with different cooking methods. Try roasting or broiling vegetables instead of steaming or sauteing them.  Add frozen vegetables to soups, stews, pasta, or rice.  Add nuts or seeds for added healthy fat at each meal. You can add these to yogurt, salads, or vegetable dishes.  Marinate fish or vegetables using olive oil, lemon juice, garlic, and fresh herbs. Meal planning  Plan to eat 1 vegetarian meal one day each week. Try to work up to 2 vegetarian meals, if possible.  Eat seafood 2 or more times a week.  Have healthy snacks readily available, such as: ? Vegetable sticks with hummus. ? [place] yogurt. ? Fruit and nut trail mix.  Eat balanced meals throughout the week. This includes: ? Fruit: 2-3 servings a day ? Vegetables: 4-5 servings a day ? Low-fat dairy: 2 servings a day ? Fish, poultry, or lean meat: 1 serving a day ? Beans and legumes: 2 or more servings a week ? Nuts and seeds: 1-2 servings a day ? Whole grains: 6-8 servings a day ? Extra-virgin olive oil: 3-4 servings a day  Limit red meat and sweets to only a few servings a month What are my food choices?  Mediterranean diet ? Recommended ? Grains: Whole-grain pasta. Brown rice. Bulgar wheat. Polenta. Couscous. Whole-wheat bread. Orpah Cobb. ? Vegetables: Artichokes. Beets. Broccoli. Cabbage. Carrots. Eggplant. Green beans. Chard. Kale. Spinach. Onions. Leeks. Peas. Squash. Tomatoes. Peppers. Radishes. ? Fruits: Apples. Apricots. Avocado. Berries. Bananas. Cherries. Dates. Figs. Grapes. Lemons. Melon. Oranges. Peaches. Plums. Pomegranate. ? Meats and other protein foods: Beans. Almonds. Sunflower seeds. Pine nuts. Peanuts.  Cod. Salmon. Scallops. Shrimp. Tuna. Tilapia. Clams. Oysters. Eggs. ? Dairy: Low-fat milk. Cheese. Greek yogurt. ? Beverages: Water. Red wine. Herbal tea. ? Fats and oils: Extra virgin olive oil. Avocado oil. Grape seed oil. ? Sweets and desserts: [place] yogurt with honey. Baked apples. Poached pears. Trail mix. ? Seasoning and other foods: Basil. Cilantro. Coriander. Cumin. Mint. Parsley. Sage. Rosemary. Tarragon. Garlic. Oregano. Thyme. Pepper. Balsalmic vinegar. Tahini. Hummus. Tomato sauce. Olives. Mushrooms. ? Limit these ? Grains: Prepackaged pasta or rice dishes. Prepackaged cereal with added sugar. ? Vegetables: Deep fried potatoes (french fries). ? Fruits: Fruit canned in syrup. ? Meats and other protein foods: Beef. Pork. Lamb. Poultry with skin. Hot dogs. Tomasa Blase. ? Dairy: Ice cream. Sour cream. Whole milk. ? Beverages: Juice. Sugar-sweetened soft drinks. Beer. Liquor and spirits. ? Fats and oils: Butter. Canola oil. Vegetable oil. Beef fat (tallow). Lard. ? Sweets and desserts: Cookies. Cakes. Pies. Candy. ? Seasoning and other foods: Mayonnaise. Premade sauces and marinades. ? The items listed may not be a complete list. Talk with your dietitian about what dietary choices are right for you. Summary  The Mediterranean diet includes both food and lifestyle choices.  Eat a variety of fresh fruits and vegetables, beans, nuts, seeds, and whole grains.  Limit the amount of red meat and sweets that you eat.  Talk with your health care provider about whether it is safe for you to drink red wine in moderation. This means 1 glass a day for nonpregnant women and 2 glasses a day for men. A glass of wine equals 5 oz (150 mL). This information is not intended to replace advice given to you by your health care provider.  Make sure you discuss any questions you have with your health care provider. Document Released: 07/14/2016 Document Revised: 08/16/2016 Document Reviewed: 07/14/2016 Elsevier  Interactive Patient Education  2018 [employer].    Raymond. Demaryius Imran M.D.

## 2017-05-24 ENCOUNTER — Encounter: Payer: Self-pay | Admitting: Internal Medicine

## 2017-05-24 ENCOUNTER — Ambulatory Visit (INDEPENDENT_AMBULATORY_CARE_PROVIDER_SITE_OTHER): Payer: BLUE CROSS/BLUE SHIELD | Admitting: Internal Medicine

## 2017-05-24 VITALS — BP 130/90 | HR 90 | Temp 98.3°F | Ht 67.0 in | Wt 207.6 lb

## 2017-05-24 DIAGNOSIS — R7303 Prediabetes: Secondary | ICD-10-CM | POA: Diagnosis not present

## 2017-05-24 DIAGNOSIS — E785 Hyperlipidemia, unspecified: Secondary | ICD-10-CM

## 2017-05-24 DIAGNOSIS — R6882 Decreased libido: Secondary | ICD-10-CM

## 2017-05-24 DIAGNOSIS — Z0000 Encounter for general adult medical examination without abnormal findings: Secondary | ICD-10-CM

## 2017-05-24 DIAGNOSIS — Z79899 Other long term (current) drug therapy: Secondary | ICD-10-CM

## 2017-05-24 DIAGNOSIS — R739 Hyperglycemia, unspecified: Secondary | ICD-10-CM | POA: Diagnosis not present

## 2017-05-24 DIAGNOSIS — I10 Essential (primary) hypertension: Secondary | ICD-10-CM

## 2017-05-24 DIAGNOSIS — Z125 Encounter for screening for malignant neoplasm of prostate: Secondary | ICD-10-CM

## 2017-05-24 NOTE — Patient Instructions (Addendum)
Intensify lifestyle interventions.by adding exercise as tolerated   BP goal is now  120/80 range  And if not there we can  Increase  intensity of  Medication    BP control and Cv risk factors important . Healthy weight loss will help.  Poss you fatigue and metabolic factors .   See dermatology for the multiple skin areas  That could be pre cancers or sun changes .   If ongoing concerns let us know and we can check an am testosterone   Screening.     Mediterranean Diet A Mediterranean diet refers to food and lifestyle choices that are based on the traditions of countries located on the [employer]. This way of eating has been shown to help prevent certain conditions and improve outcomes for people who have chronic diseases, like kidney disease and heart disease. What are tips for following this plan? Lifestyle  Cook and eat meals together with your family, when possible.  Drink enough fluid to keep your urine clear or pale yellow.  Be physically active every day. This includes: ? Aerobic exercise like running or swimming. ? Leisure activities like gardening, walking, or housework.  Get 7-8 hours of sleep each night.  If recommended by your health care provider, drink red wine in moderation. This means 1 glass a day for nonpregnant women and 2 glasses a day for men. A glass of wine equals 5 oz (150 mL). Reading food labels  Check the serving size of packaged foods. For foods such as rice and pasta, the serving size refers to the amount of cooked product, not dry.  Check the total fat in packaged foods. Avoid foods that have saturated fat or trans fats.  Check the ingredients list for added sugars, such as corn syrup. Shopping  At the grocery store, buy most of your food from the areas near the walls of the store. This includes: ? Fresh fruits and vegetables (produce). ? Grains, beans, nuts, and seeds. Some of these may be available in unpackaged forms or large amounts (in  bulk). ? Fresh seafood. ? Poultry and eggs. ? Low-fat dairy products.  Buy whole ingredients instead of prepackaged foods.  Buy fresh fruits and vegetables in-season from local farmers markets.  Buy frozen fruits and vegetables in resealable bags.  If you do not have access to quality fresh seafood, buy precooked frozen shrimp or canned fish, such as tuna, salmon, or sardines.  Buy small amounts of raw or cooked vegetables, salads, or olives from the deli or salad bar at your store.  Stock your pantry so you always have certain foods on hand, such as olive oil, canned tuna, canned tomatoes, rice, pasta, and beans. Cooking  Cook foods with extra-virgin olive oil instead of using butter or other vegetable oils.  Have meat as a side dish, and have vegetables or grains as your main dish. This means having meat in small portions or adding small amounts of meat to foods like pasta or stew.  Use beans or vegetables instead of meat in common dishes like chili or lasagna.  Experiment with different cooking methods. Try roasting or broiling vegetables instead of steaming or sauteing them.  Add frozen vegetables to soups, stews, pasta, or rice.  Add nuts or seeds for added healthy fat at each meal. You can add these to yogurt, salads, or vegetable dishes.  Marinate fish or vegetables using olive oil, lemon juice, garlic, and fresh herbs. Meal planning  Plan to eat 1 vegetarian meal  one day each week. Try to work up to 2 vegetarian meals, if possible.  Eat seafood 2 or more times a week.  Have healthy snacks readily available, such as: ? Vegetable sticks with hummus. ? [place] yogurt. ? Fruit and nut trail mix.  Eat balanced meals throughout the week. This includes: ? Fruit: 2-3 servings a day ? Vegetables: 4-5 servings a day ? Low-fat dairy: 2 servings a day ? Fish, poultry, or lean meat: 1 serving a day ? Beans and legumes: 2 or more servings a week ? Nuts and seeds: 1-2 servings  a day ? Whole grains: 6-8 servings a day ? Extra-virgin olive oil: 3-4 servings a day  Limit red meat and sweets to only a few servings a month What are my food choices?  Mediterranean diet ? Recommended ? Grains: Whole-grain pasta. Brown rice. Bulgar wheat. Polenta. Couscous. Whole-wheat bread. [place]. ? Vegetables: Artichokes. Beets. Broccoli. Cabbage. Carrots. Eggplant. Green beans. Chard. Kale. Spinach. Onions. Leeks. Peas. Squash. Tomatoes. Peppers. Radishes. ? Fruits: Apples. Apricots. Avocado. Berries. Bananas. Cherries. Dates. Figs. Grapes. Lemons. Melon. Oranges. Peaches. Plums. Pomegranate. ? Meats and other protein foods: Beans. Almonds. Sunflower seeds. Pine nuts. Peanuts. [place]. Salmon. Scallops. Shrimp. Impact. Tilapia. Clams. Oysters. Eggs. ? Dairy: Low-fat milk. Cheese. Greek yogurt. ? Beverages: Water. Red wine. Herbal tea. ? Fats and oils: Extra virgin olive oil. Avocado oil. Grape seed oil. ? Sweets and desserts: [place] yogurt with honey. Baked apples. Poached pears. Trail mix. ? Seasoning and other foods: Basil. Cilantro. Coriander. Cumin. Mint. Parsley. Sage. Rosemary. Tarragon. Garlic. Oregano. Thyme. Pepper. Balsalmic vinegar. Tahini. Hummus. Tomato sauce. Olives. Mushrooms. ? Limit these ? Grains: Prepackaged pasta or rice dishes. Prepackaged cereal with added sugar. ? Vegetables: Deep fried potatoes (french fries). ? Fruits: Fruit canned in syrup. ? Meats and other protein foods: Beef. Pork. Lamb. Poultry with skin. Hot dogs. [place] Salines. ? Dairy: Ice cream. Sour cream. Whole milk. ? Beverages: Juice. Sugar-sweetened soft drinks. Beer. Liquor and spirits. ? Fats and oils: Butter. Canola oil. Vegetable oil. Beef fat (tallow). Lard. ? Sweets and desserts: Cookies. Cakes. Pies. Candy. ? Seasoning and other foods: Mayonnaise. Premade sauces and marinades. ? The items listed may not be a complete list. Talk with your dietitian about what dietary choices are right for  you. Summary  The Mediterranean diet includes both food and lifestyle choices.  Eat a variety of fresh fruits and vegetables, beans, nuts, seeds, and whole grains.  Limit the amount of red meat and sweets that you eat.  Talk with your health care provider about whether it is safe for you to drink red wine in moderation. This means 1 glass a day for nonpregnant women and 2 glasses a day for men. A glass of wine equals 5 oz (150 mL). This information is not intended to replace advice given to you by your health care provider. Make sure you discuss any questions you have with your health care provider. Document Released: 07/14/2016 Document Revised: 08/16/2016 Document Reviewed: 07/14/2016 Elsevier Interactive Patient Education  [employer].

## 2017-05-25 LAB — BASIC METABOLIC PANEL
BUN: 17 mg/dL (ref 6–23)
CO2: 27 meq/L (ref 19–32)
Calcium: 10 mg/dL (ref 8.4–10.5)
Chloride: 102 mEq/L (ref 96–112)
Creatinine, Ser: 1.23 mg/dL (ref 0.40–1.50)
GFR: 64.29 mL/min (ref >60.00)
GLUCOSE: 104 mg/dL — AB (ref 70–99)
Potassium: 4 mEq/L (ref 3.5–5.1)
SODIUM: 139 meq/L (ref 135–145)

## 2017-05-25 LAB — LIPID PANEL
CHOL/HDL RATIO: 6
Cholesterol: 281 mg/dL — ABNORMAL HIGH (ref 0–200)
HDL: 48.2 mg/dL (ref >39.00)
LDL CALC: 200 mg/dL — AB (ref 0–99)
NONHDL: 232.46
Triglycerides: 161 mg/dL — ABNORMAL HIGH (ref 0.0–149.0)
VLDL: 32.2 mg/dL (ref 0.0–40.0)

## 2017-05-25 LAB — HEPATIC FUNCTION PANEL
ALK PHOS: 46 U/L (ref 39–117)
ALT: 23 U/L (ref 0–53)
AST: 18 U/L (ref 0–37)
Albumin: 4.6 g/dL (ref 3.5–5.2)
BILIRUBIN DIRECT: 0.1 mg/dL (ref 0.0–0.3)
BILIRUBIN TOTAL: 0.8 mg/dL (ref 0.2–1.2)
TOTAL PROTEIN: 6.7 g/dL (ref 6.0–8.3)

## 2017-05-25 LAB — CBC WITH DIFFERENTIAL/PLATELET
BASOS ABS: 0.1 10*3/uL (ref 0.0–0.1)
Basophils Relative: 0.8 % (ref 0.0–3.0)
EOS ABS: 0.2 10*3/uL (ref 0.0–0.7)
Eosinophils Relative: 2 % (ref 0.0–5.0)
HEMATOCRIT: 51 % (ref 39.0–52.0)
Hemoglobin: 17.5 g/dL — ABNORMAL HIGH (ref 13.0–17.0)
LYMPHS ABS: 2.9 10*3/uL (ref 0.7–4.0)
LYMPHS PCT: 26.9 % (ref 12.0–46.0)
MCHC: 34.2 g/dL (ref 30.0–36.0)
MCV: 90.7 fl (ref 78.0–100.0)
MONOS PCT: 9.1 % (ref 3.0–12.0)
Monocytes Absolute: 1 10*3/uL (ref 0.1–1.0)
NEUTROS PCT: 61.2 % (ref 43.0–77.0)
Neutro Abs: 6.7 10*3/uL (ref 1.4–7.7)
Platelets: 263 10*3/uL (ref 150.0–400.0)
RBC: 5.62 Mil/uL (ref 4.22–5.81)
RDW: 13.5 % (ref 11.5–15.5)
WBC: 10.9 10*3/uL — ABNORMAL HIGH (ref 4.0–10.5)

## 2017-05-25 LAB — HEMOGLOBIN A1C: Hgb A1c MFr Bld: 6.9 % — ABNORMAL HIGH (ref 4.6–6.5)

## 2017-05-25 LAB — TSH: TSH: 0.78 u[IU]/mL (ref 0.35–4.50)

## 2017-05-25 LAB — PSA: PSA: 0.79 ng/mL (ref 0.10–4.00)

## 2017-08-08 DIAGNOSIS — Z23 Encounter for immunization: Secondary | ICD-10-CM | POA: Diagnosis not present

## 2017-08-15 ENCOUNTER — Ambulatory Visit (INDEPENDENT_AMBULATORY_CARE_PROVIDER_SITE_OTHER): Payer: BLUE CROSS/BLUE SHIELD | Admitting: Emergency Medicine

## 2017-08-15 DIAGNOSIS — Z23 Encounter for immunization: Secondary | ICD-10-CM

## 2017-08-25 ENCOUNTER — Encounter: Payer: Self-pay | Admitting: Internal Medicine

## 2017-10-02 ENCOUNTER — Encounter: Payer: Self-pay | Admitting: Internal Medicine

## 2017-10-02 ENCOUNTER — Ambulatory Visit (INDEPENDENT_AMBULATORY_CARE_PROVIDER_SITE_OTHER): Payer: BLUE CROSS/BLUE SHIELD | Admitting: Internal Medicine

## 2017-10-02 ENCOUNTER — Ambulatory Visit (INDEPENDENT_AMBULATORY_CARE_PROVIDER_SITE_OTHER)
Admission: RE | Admit: 2017-10-02 | Discharge: 2017-10-02 | Disposition: A | Discharge status: Home / Self Care | Payer: BLUE CROSS/BLUE SHIELD | Source: Ambulatory Visit | Attending: Internal Medicine | Admitting: Internal Medicine

## 2017-10-02 VITALS — BP 128/86 | HR 84 | Temp 97.9°F | Wt 204.8 lb

## 2017-10-02 DIAGNOSIS — R05 Cough: Secondary | ICD-10-CM

## 2017-10-02 DIAGNOSIS — R053 Chronic cough: Secondary | ICD-10-CM

## 2017-10-02 DIAGNOSIS — I10 Essential (primary) hypertension: Secondary | ICD-10-CM

## 2017-10-02 DIAGNOSIS — Z79899 Other long term (current) drug therapy: Secondary | ICD-10-CM | POA: Diagnosis not present

## 2017-10-02 DIAGNOSIS — R0989 Other specified symptoms and signs involving the circulatory and respiratory systems: Secondary | ICD-10-CM

## 2017-10-02 MED ORDER — LOSARTAN POTASSIUM-HCTZ 50-12.5 MG PO TABS: 1.0000 | ORAL_TABLET | Freq: Every day | ORAL | 1 refills | Status: DC

## 2017-10-02 MED ORDER — PREDNISONE 20 MG PO TABS
20.0000 mg | ORAL_TABLET | Freq: Two times a day (BID) | ORAL | 0 refills | Status: DC
Start: 2017-10-02 — End: 2017-10-23

## 2017-10-02 NOTE — Progress Notes (Signed)
Chief Complaint  Patient presents with  . Cough    Pt states that symptoms have been present since July. Pt states that when he lays flat his chest rattles. Pt c/o chronic cough and feeling sluggish, low energy. Denies SOB    HPI: Jonathan [name] 56 y.o.  sda comes in today with above symptoms.  He had the onset of which was like sinus symptoms almost allergy-like congestion some cough in July then took a trip to [place] on a mission trip to get exposed to lot of dust or dust which made things somewhat worse had sinus face pain that is since almost gone.  Nasal drainage and cough did have discoloration.  Began using Nasonex with some improvement now drainage is clear but has a continued cough wheezy worse when he lays down. No specific shortness of breath but is very tired is not taking antihistamines at this time.  No one else in his group has been sick.  He use doxycycline for malaria prophylaxis. Denies very specific heartburn. ROS: See pertinent positives and negatives per HPI.  Cough is like a tickle in his throat and worse when he lays down horizontal.  Past Medical History:  Diagnosis Date  . Allergic rhinitis   . Anxiety   . Depression   . History of nephrolithiasis   . Hyperlipidemia   . Hypertension   . Sebaceous cyst     Family History  Problem Relation Age of Onset  . Diabetes Father   . Hypertension Father   . Kidney cancer Father        now on dialysis    Social History   Social History  . Marital status: Married    Spouse name: N/A  . Number of children: 1  . Years of education: N/A   Occupational History  . [place]. [employer]   Social History Main Topics  . Smoking status: Never Smoker  . Smokeless tobacco: Never Used  . Alcohol use Yes     Comment: less than 1 a week  . Drug use: No  . Sexual activity: Yes   Other Topics Concern  . None   Social History Narrative   Married   Regular exercise- no   HH of 3   Dog car and lizard     Sleep wakening at times   About 40 hours  Per week reg sleep.     Outpatient Medications Prior to Visit  Medication Sig Dispense Refill  . acetaminophen (TYLENOL) 325 MG tablet Take 650 mg by mouth every 6 (six) hours as needed.      . IBUPROFEN PO Take 400 mg by mouth as needed.    . Omega-3 Fatty Acids (FISH OIL PO) Take by mouth.    Marland Kitchen VITAMIN E PO Take by mouth.    Marland Kitchen lisinopril-hydrochlorothiazide (PRINZIDE,ZESTORETIC) 20-12.5 MG tablet TAKE 1 TABLET BY MOUTH DAILY FOR HIGH BLOOD PRESSURE 90 tablet 2  . DimenhyDRINATE (DRAMAMINE PO) Take by mouth.     No facility-administered medications prior to visit.      EXAM:  BP 128/86 (BP Location: Right Arm, Patient Position: Sitting, Cuff Size: Normal)   Pulse 84   Temp 97.9 F (36.6 C) (Oral)   Wt 204 lb 12.8 oz (92.9 kg)   BMI 32.08 kg/m   Body mass index is 32.08 kg/m.  GENERAL: vitals reviewed and listed above, alert, oriented, appears well hydrated and in no acute distress appears congested but nontoxic. HEENT: atraumatic, conjunctiva  clear, no obvious abnormalities on inspection of external nose and ears TMs are normal intact nares congested face nontender but appears sinus congestion OP : no lesion edema or exudate mild erythema no PND seen NECK: no obvious masses on inspection palpation  LUNGS: clear to auscultation bilaterally, no wheezes, rales or rhonchi, has wheezy bronchitic type cough CV: HRRR, no clubbing cyanosis or  peripheral edema nl cap refill  MS: moves all extremities without noticeable focal  abnormality PSYCH: pleasant and cooperative, no obvious depression or anxiety  ASSESSMENT AND PLAN:  Discussed the following assessment and plan:  Cough, persistent - Plan: DG Chest 2 View  Chronic sinus complaints - Plan: DG Chest 2 View  Essential hypertension - Plan: DG Chest 2 View  Medication management At this time I would suspect this is a chronic sinusitis aggravated by exposures and travel and may  have had some infection that is since resolved.  However because of the prolonged cough check chest x-ray today if all okay plan 3-5 days of prednisone switch his ACEinhibitor and plan follow-up if not improving he should remain on the Nasonex after the prednisone finishes.   -Patient advised to return or notify health care team  if symptoms worsen ,persist or new concerns arise.  Patient Instructions  Get your chest x-ray today. If it is clear as I would suspect we will send in a 5-day course of prednisone anti-inflammatory to try to decrease the inflammation in your sinuses. Stay on the Nasonex every day. We will be switching her blood pressure medicine to something similar but should not cause a cough or aggravation of underlying cough.  Do not take the antihistamines at this point unless it something like Claritin generic because it could make you tired. If not improved in 2-3 weeks plan follow-up visit.     Cough, Adult Coughing is a reflex that clears your throat and your airways. Coughing helps to heal and protect your lungs. It is normal to cough occasionally, but a cough that happens with other symptoms or lasts a long time may be a sign of a condition that needs treatment. A cough may last only 2-3 weeks (acute), or it may last longer than 8 weeks (chronic). What are the causes? Coughing is commonly caused by:  Breathing in substances that irritate your lungs.  A viral or bacterial respiratory infection.  Allergies.  Asthma.  Postnasal drip.  Smoking.  Acid backing up from the stomach into the esophagus (gastroesophageal reflux).  Certain medicines.  Chronic lung problems, including COPD (or rarely, lung cancer).  Other medical conditions such as heart failure.  Follow these instructions at home: Pay attention to any changes in your symptoms. Take these actions to help with your discomfort:  Take medicines only as told by your health care provider. ? If you  were prescribed an antibiotic medicine, take it as told by your health care provider. Do not stop taking the antibiotic even if you start to feel better. ? Talk with your health care provider before you take a cough suppressant medicine.  Drink enough fluid to keep your urine clear or pale yellow.  If the air is dry, use a cold steam vaporizer or humidifier in your bedroom or your home to help loosen secretions.  Avoid anything that causes you to cough at work or at home.  If your cough is worse at night, try sleeping in a semi-upright position.  Avoid cigarette smoke. If you smoke, quit smoking. If you need help  quitting, ask your health care provider.  Avoid caffeine.  Avoid alcohol.  Rest as needed.  Contact a health care provider if:  You have new symptoms.  You cough up pus.  Your cough does not get better after 2-3 weeks, or your cough gets worse.  You cannot control your cough with suppressant medicines and you are losing sleep.  You develop pain that is getting worse or pain that is not controlled with pain medicines.  You have a fever.  You have unexplained weight loss.  You have night sweats. Get help right away if:  You cough up blood.  You have difficulty breathing.  Your heartbeat is very fast. This information is not intended to replace advice given to you by your health care provider. Make sure you discuss any questions you have with your health care provider. Document Released: 05/20/2011 Document Revised: 04/28/2016 Document Reviewed: 01/28/2015 Elsevier Interactive Patient Education  2017 [place] K. Marionna Gonia M.D.

## 2017-10-02 NOTE — Patient Instructions (Addendum)
Get your chest x-ray today. If it is clear as I would suspect we will send in a 5-day course of prednisone anti-inflammatory to try to decrease the inflammation in your sinuses. Stay on the Nasonex every day. We will be switching her blood pressure medicine to something similar but should not cause a cough or aggravation of underlying cough.  Do not take the antihistamines at this point unless it something like Claritin generic because it could make you tired. If not improved in 2-3 weeks plan follow-up visit.     Cough, Adult Coughing is a reflex that clears your throat and your airways. Coughing helps to heal and protect your lungs. It is normal to cough occasionally, but a cough that happens with other symptoms or lasts a long time may be a sign of a condition that needs treatment. A cough may last only 2-3 weeks (acute), or it may last longer than 8 weeks (chronic). What are the causes? Coughing is commonly caused by:  Breathing in substances that irritate your lungs.  A viral or bacterial respiratory infection.  Allergies.  Asthma.  Postnasal drip.  Smoking.  Acid backing up from the stomach into the esophagus (gastroesophageal reflux).  Certain medicines.  Chronic lung problems, including COPD (or rarely, lung cancer).  Other medical conditions such as heart failure.  Follow these instructions at home: Pay attention to any changes in your symptoms. Take these actions to help with your discomfort:  Take medicines only as told by your health care provider. ? If you were prescribed an antibiotic medicine, take it as told by your health care provider. Do not stop taking the antibiotic even if you start to feel better. ? Talk with your health care provider before you take a cough suppressant medicine.  Drink enough fluid to keep your urine clear or pale yellow.  If the air is dry, use a cold steam vaporizer or humidifier in your bedroom or your home to help loosen  secretions.  Avoid anything that causes you to cough at work or at home.  If your cough is worse at night, try sleeping in a semi-upright position.  Avoid cigarette smoke. If you smoke, quit smoking. If you need help quitting, ask your health care provider.  Avoid caffeine.  Avoid alcohol.  Rest as needed.  Contact a health care provider if:  You have new symptoms.  You cough up pus.  Your cough does not get better after 2-3 weeks, or your cough gets worse.  You cannot control your cough with suppressant medicines and you are losing sleep.  You develop pain that is getting worse or pain that is not controlled with pain medicines.  You have a fever.  You have unexplained weight loss.  You have night sweats. Get help right away if:  You cough up blood.  You have difficulty breathing.  Your heartbeat is very fast. This information is not intended to replace advice given to you by your health care provider. Make sure you discuss any questions you have with your health care provider. Document Released: 05/20/2011 Document Revised: 04/28/2016 Document Reviewed: 01/28/2015 Elsevier Interactive Patient Education  2017 [employer].

## 2017-10-23 ENCOUNTER — Ambulatory Visit: Payer: BLUE CROSS/BLUE SHIELD | Admitting: Family Medicine

## 2017-10-23 VITALS — BP 140/80 | HR 75 | Temp 97.6°F | Wt 207.0 lb

## 2017-10-23 DIAGNOSIS — J00 Acute nasopharyngitis [common cold]: Secondary | ICD-10-CM

## 2017-10-23 DIAGNOSIS — R0982 Postnasal drip: Secondary | ICD-10-CM

## 2017-10-23 MED ORDER — FLUTICASONE PROPIONATE 50 MCG/ACT NA SUSP: 2.0000 | Freq: Every day | NASAL | 6 refills | Status: DC

## 2017-10-23 NOTE — Patient Instructions (Addendum)
You can try taking Delsym for your cough.  It is over the counter at the pharmacy.     Remember you can gargle your throat with warm salt water or Chloraseptic spray.  A prescription for Flonase was sent to your pharmacy.  You can start taking this daily.  Stop using your other nose spray (Nasacort)  We also discussed taking a daily allergy medicine such as Zyrtec, claritin, allegra, or their generic versions. Upper Respiratory Infection, Adult Most upper respiratory infections (URIs) are a viral infection of the air passages leading to the lungs. A URI affects the nose, throat, and upper air passages. The most common type of URI is nasopharyngitis and is typically referred to as "the common cold." URIs run their course and usually go away on their own. Most of the time, a URI does not require medical attention, but sometimes a bacterial infection in the upper airways can follow a viral infection. This is called a secondary infection. Sinus and middle ear infections are common types of secondary upper respiratory infections. Bacterial pneumonia can also complicate a URI. A URI can worsen asthma and chronic obstructive pulmonary disease (COPD). Sometimes, these complications can require emergency medical care and may be life threatening. What are the causes? Almost all URIs are caused by viruses. A virus is a type of germ and can spread from one person to another. What increases the risk? You may be at risk for a URI if:  You smoke.  You have chronic heart or lung disease.  You have a weakened defense (immune) system.  You are very young or very old.  You have nasal allergies or asthma.  You work in crowded or poorly ventilated areas.  You work in health care facilities or schools.  What are the signs or symptoms? Symptoms typically develop 2-3 days after you come in contact with a cold virus. Most viral URIs last 7-10 days. However, viral URIs from the influenza virus (flu virus) can  last 14-18 days and are typically more severe. Symptoms may include:  Runny or stuffy (congested) nose.  Sneezing.  Cough.  Sore throat.  Headache.  Fatigue.  Fever.  Loss of appetite.  Pain in your forehead, behind your eyes, and over your cheekbones (sinus pain).  Muscle aches.  How is this diagnosed? Your health care provider may diagnose a URI by:  Physical exam.  Tests to check that your symptoms are not due to another condition such as: ? Strep throat. ? Sinusitis. ? Pneumonia. ? Asthma.  How is this treated? A URI goes away on its own with time. It cannot be cured with medicines, but medicines may be prescribed or recommended to relieve symptoms. Medicines may help:  Reduce your fever.  Reduce your cough.  Relieve nasal congestion.  Follow these instructions at home:  Take medicines only as directed by your health care provider.  Gargle warm saltwater or take cough drops to comfort your throat as directed by your health care provider.  Use a warm mist humidifier or inhale steam from a shower to increase air moisture. This may make it easier to breathe.  Drink enough fluid to keep your urine clear or pale yellow.  Eat soups and other clear broths and maintain good nutrition.  Rest as needed.  Return to work when your temperature has returned to normal or as your health care provider advises. You may need to stay home longer to avoid infecting others. You can also use a face mask and  careful hand washing to prevent spread of the virus.  Increase the usage of your inhaler if you have asthma.  Do not use any tobacco products, including cigarettes, chewing tobacco, or electronic cigarettes. If you need help quitting, ask your health care provider. How is this prevented? The best way to protect yourself from getting a cold is to practice good hygiene.  Avoid oral or hand contact with people with cold symptoms.  Wash your hands often if contact  occurs.  There is no clear evidence that vitamin C, vitamin E, echinacea, or exercise reduces the chance of developing a cold. However, it is always recommended to get plenty of rest, exercise, and practice good nutrition. Contact a health care provider if:  You are getting worse rather than better.  Your symptoms are not controlled by medicine.  You have chills.  You have worsening shortness of breath.  You have brown or red mucus.  You have yellow or brown nasal discharge.  You have pain in your face, especially when you bend forward.  You have a fever.  You have swollen neck glands.  You have pain while swallowing.  You have white areas in the back of your throat. Get help right away if:  You have severe or persistent: ? Headache. ? Ear pain. ? Sinus pain. ? Chest pain.  You have chronic lung disease and any of the following: ? Wheezing. ? Prolonged cough. ? Coughing up blood. ? A change in your usual mucus.  You have a stiff neck.  You have changes in your: ? Vision. ? Hearing. ? Thinking. ? Mood. This information is not intended to replace advice given to you by your health care provider. Make sure you discuss any questions you have with your health care provider. Document Released: 05/17/2001 Document Revised: 07/24/2016 Document Reviewed: 02/26/2014 Elsevier Interactive Patient Education  2017 [employer].

## 2017-10-23 NOTE — Progress Notes (Signed)
Subjective:    Patient ID: Jonathan [name], male    DOB: 08-04-1961, 56 y.o.   MRN: [id number]  No chief complaint on file.   HPI Patient was seen today for acute concern.  Nasal congestion, cough, subjective fever started last wk (Thursday?).  Pt has tried benadryl, a decongestant, and a pain reliever.  He has also used Nasacort with some relief.  Pt states his daughter is now getting sick.  Pt was recently seen at the end of Oct for congestion/cough.  He was given prednisone which he states helped.  Past Medical History:  Diagnosis Date  . Allergic rhinitis   . Anxiety   . Depression   . History of nephrolithiasis   . Hyperlipidemia   . Hypertension   . Sebaceous cyst     Allergies  Allergen Reactions  . Zocor [Simvastatin]     ? myalgias  . Penicillins Hives    Per pt - happened in childhood - hives possible reaction    ROS     Objective:    Blood pressure 140/80, pulse 75, temperature 97.6 F (36.4 C), temperature source Oral, weight 207 lb (93.9 kg), SpO2 96 %.   Gen. Pleasant, well-nourished, in no distress, normal affect   HEENT: [place]/AT, face symmetric, no scleral icterus, PERRLA, EOMI, nares patent without drainage, pharynx with post nasal drainage, mild erythema, no exudate.  B/l TMs normal, without effusion. Lungs: no accessory muscle use, CTAB, mild scattered wheezes, no rales Cardiovascular: RRR, no m/r/g, no peripheral edema Neuro:  A&Ox3, CN II-XII intact, normal gait   Wt Readings from Last 3 Encounters:  10/23/17 207 lb (93.9 kg)  10/02/17 204 lb 12.8 oz (92.9 kg)  05/24/17 207 lb 9.6 oz (94.2 kg)    Lab Results  Component Value Date   WBC 10.9 (H) 05/24/2017   HGB 17.5 (H) 05/24/2017   HCT 51.0 05/24/2017   PLT 263.0 05/24/2017   GLUCOSE 104 (H) 05/24/2017   CHOL 281 (H) 05/24/2017   TRIG 161.0 (H) 05/24/2017   HDL 48.20 05/24/2017   LDLDIRECT 190.2 12/11/2013   LDLCALC 200 (H) 05/24/2017   ALT 23 05/24/2017   AST 18 05/24/2017   NA  139 05/24/2017   K 4.0 05/24/2017   CL 102 05/24/2017   CREATININE 1.23 05/24/2017   BUN 17 05/24/2017   CO2 27 05/24/2017   TSH 0.78 05/24/2017   PSA 0.79 05/24/2017   HGBA1C 6.9 (H) 05/24/2017   MICROALBUR 1.0 06/01/2015    Assessment/Plan:  Pt is a 56 yo with continued cough and congestion x several months with intermittent improvement.  Acute nasopharyngitis -discussed viral in nature, though could progress to a sinusitis. -supportive care -continue hydration -Delsym ok for cough. -F/u in 2 days for continued or worsening symptoms.  May need repeat CXR for continued issues.  Post-nasal drainage -Flonase daily -reviewed nasal spray use -Encouraged to take a daily allergy pill

## 2017-11-02 ENCOUNTER — Other Ambulatory Visit: Payer: Self-pay | Admitting: Internal Medicine

## 2017-11-23 ENCOUNTER — Encounter: Payer: Self-pay | Admitting: Family Medicine

## 2017-11-23 ENCOUNTER — Ambulatory Visit: Payer: BLUE CROSS/BLUE SHIELD | Admitting: Family Medicine

## 2017-11-23 VITALS — BP 152/90 | HR 74 | Temp 98.0°F | Wt 213.1 lb

## 2017-11-23 DIAGNOSIS — R0982 Postnasal drip: Secondary | ICD-10-CM | POA: Diagnosis not present

## 2017-11-23 DIAGNOSIS — J40 Bronchitis, not specified as acute or chronic: Secondary | ICD-10-CM | POA: Diagnosis not present

## 2017-11-23 MED ORDER — AZITHROMYCIN 250 MG PO TABS: ORAL_TABLET | ORAL | 0 refills | Status: DC

## 2017-11-23 MED ORDER — PREDNISONE 10 MG PO TABS: ORAL_TABLET | ORAL | 0 refills | Status: DC

## 2017-11-23 MED ORDER — HYDROCODONE-HOMATROPINE 5-1.5 MG/5ML PO SYRP: 5.0000 mL | ORAL_SOLUTION | Freq: Three times a day (TID) | ORAL | 0 refills | Status: DC | PRN

## 2017-11-23 NOTE — Progress Notes (Signed)
Subjective:    Patient ID: Jonathan [name], male    DOB: Jun 22, 1961, 56 y.o.   MRN: [id number]  No chief complaint on file.   HPI Patient was seen today for acute concern.  Postnasal drip, cough, fatigue that have continued since last OFV on 10/23/17.  Pt states cough now at home a rattling noise worse at night, productive.  Patient denies fever, sore throat, ear pain/pressure, headache, S OB, chest pain, N/V.  Patient has tried Zyrtec, Human resources officer, Xyzal, Flonase.  Past Medical History:  Diagnosis Date  . Allergic rhinitis   . Anxiety   . Depression   . History of nephrolithiasis   . Hyperlipidemia   . Hypertension   . Sebaceous cyst     Allergies  Allergen Reactions  . Zocor [Simvastatin]     ? myalgias  . Penicillins Hives    Per pt - happened in childhood - hives possible reaction    ROS General: Denies fever, chills, night sweats, changes in weight, changes in appetite  +fatigue HEENT: Denies headaches, ear pain, changes in vision, rhinorrhea, sore throat  +post nasal drip CV: Denies CP, palpitations, SOB, orthopnea Pulm: Denies SOB, wheezing  +cough, productive at times GI: Denies abdominal pain, nausea, vomiting, diarrhea, constipation GU: Denies dysuria, hematuria, frequency, vaginal discharge Msk: Denies muscle cramps, joint pains Neuro: Denies weakness, numbness, tingling Skin: Denies rashes, bruising Psych: Denies depression, anxiety, hallucinations     Objective:    Blood pressure (!) 152/90, pulse 74, temperature 98 F (36.7 C), temperature source Oral, weight 213 lb 1.6 oz (96.7 kg), SpO2 97 %.   Gen. Pleasant, well-nourished, in no distress, normal affect   HEENT: [place]/AT, face symmetric,no scleral icterus, PERRLA, nares patent without drainage, pharynx with post nasal drainage, mild erythema, no exudate. Lungs: cough, no accessory muscle use, faint wheezes throughout, no rales.  No dullness to percussion. Cardiovascular: RRR, no m/r/g, no peripheral  edema Abdomen: BS present, soft, NT/ND, no hepatosplenomegaly. Neuro:  A&Ox3, CN II-XII intact, normal gait   Wt Readings from Last 3 Encounters:  11/23/17 213 lb 1.6 oz (96.7 kg)  10/23/17 207 lb (93.9 kg)  10/02/17 204 lb 12.8 oz (92.9 kg)    Lab Results  Component Value Date   WBC 10.9 (H) 05/24/2017   HGB 17.5 (H) 05/24/2017   HCT 51.0 05/24/2017   PLT 263.0 05/24/2017   GLUCOSE 104 (H) 05/24/2017   CHOL 281 (H) 05/24/2017   TRIG 161.0 (H) 05/24/2017   HDL 48.20 05/24/2017   LDLDIRECT 190.2 12/11/2013   LDLCALC 200 (H) 05/24/2017   ALT 23 05/24/2017   AST 18 05/24/2017   NA 139 05/24/2017   K 4.0 05/24/2017   CL 102 05/24/2017   CREATININE 1.23 05/24/2017   BUN 17 05/24/2017   CO2 27 05/24/2017   TSH 0.78 05/24/2017   PSA 0.79 05/24/2017   HGBA1C 6.9 (H) 05/24/2017   MICROALBUR 1.0 06/01/2015    Assessment/Plan:  Bronchitis  -given handout -Discussed obtaining CXR, pt declines at this time. - Plan: predniSONE (DELTASONE) 10 MG tablet, HYDROcodone-homatropine (HYCODAN) 5-1.5 MG/5ML syrup, azithromycin (ZITHROMAX) 250 MG tablet  Post-nasal drainage -continue flonase   F/u prn  Grier Mitts

## 2017-11-23 NOTE — Patient Instructions (Addendum)

## 2018-03-19 ENCOUNTER — Ambulatory Visit (INDEPENDENT_AMBULATORY_CARE_PROVIDER_SITE_OTHER): Payer: BLUE CROSS/BLUE SHIELD | Admitting: Psychology

## 2018-03-19 DIAGNOSIS — F4323 Adjustment disorder with mixed anxiety and depressed mood: Secondary | ICD-10-CM

## 2018-03-31 ENCOUNTER — Other Ambulatory Visit: Payer: Self-pay | Admitting: Internal Medicine

## 2018-04-05 ENCOUNTER — Ambulatory Visit (INDEPENDENT_AMBULATORY_CARE_PROVIDER_SITE_OTHER): Payer: 59 | Admitting: Psychology

## 2018-04-05 DIAGNOSIS — F4323 Adjustment disorder with mixed anxiety and depressed mood: Secondary | ICD-10-CM

## 2018-04-11 ENCOUNTER — Ambulatory Visit (INDEPENDENT_AMBULATORY_CARE_PROVIDER_SITE_OTHER): Payer: BLUE CROSS/BLUE SHIELD | Admitting: Psychology

## 2018-04-11 DIAGNOSIS — F4323 Adjustment disorder with mixed anxiety and depressed mood: Secondary | ICD-10-CM

## 2018-04-18 ENCOUNTER — Ambulatory Visit (INDEPENDENT_AMBULATORY_CARE_PROVIDER_SITE_OTHER): Payer: 59 | Admitting: Psychology

## 2018-04-18 DIAGNOSIS — F4323 Adjustment disorder with mixed anxiety and depressed mood: Secondary | ICD-10-CM

## 2018-05-23 ENCOUNTER — Ambulatory Visit (INDEPENDENT_AMBULATORY_CARE_PROVIDER_SITE_OTHER): Payer: 59 | Admitting: Psychology

## 2018-05-23 DIAGNOSIS — F4323 Adjustment disorder with mixed anxiety and depressed mood: Secondary | ICD-10-CM | POA: Diagnosis not present

## 2018-05-31 ENCOUNTER — Ambulatory Visit (INDEPENDENT_AMBULATORY_CARE_PROVIDER_SITE_OTHER): Payer: 59 | Admitting: Psychology

## 2018-05-31 DIAGNOSIS — F4323 Adjustment disorder with mixed anxiety and depressed mood: Secondary | ICD-10-CM

## 2018-06-15 ENCOUNTER — Ambulatory Visit: Payer: BLUE CROSS/BLUE SHIELD | Admitting: Psychology

## 2018-06-15 DIAGNOSIS — F4323 Adjustment disorder with mixed anxiety and depressed mood: Secondary | ICD-10-CM | POA: Diagnosis not present

## 2018-06-28 ENCOUNTER — Ambulatory Visit: Payer: BLUE CROSS/BLUE SHIELD | Admitting: Psychology

## 2018-06-28 DIAGNOSIS — F4323 Adjustment disorder with mixed anxiety and depressed mood: Secondary | ICD-10-CM | POA: Diagnosis not present

## 2018-07-06 ENCOUNTER — Ambulatory Visit: Payer: BLUE CROSS/BLUE SHIELD | Admitting: Psychology

## 2018-07-06 DIAGNOSIS — F4323 Adjustment disorder with mixed anxiety and depressed mood: Secondary | ICD-10-CM

## 2018-07-07 ENCOUNTER — Other Ambulatory Visit: Payer: Self-pay | Admitting: Internal Medicine

## 2018-07-27 ENCOUNTER — Ambulatory Visit: Payer: BLUE CROSS/BLUE SHIELD | Admitting: Psychology

## 2018-07-27 DIAGNOSIS — F4323 Adjustment disorder with mixed anxiety and depressed mood: Secondary | ICD-10-CM

## 2018-08-08 ENCOUNTER — Ambulatory Visit: Payer: BLUE CROSS/BLUE SHIELD | Admitting: Psychology

## 2018-08-08 DIAGNOSIS — F4323 Adjustment disorder with mixed anxiety and depressed mood: Secondary | ICD-10-CM | POA: Diagnosis not present

## 2018-08-15 ENCOUNTER — Encounter: Payer: Self-pay | Admitting: Internal Medicine

## 2018-08-15 ENCOUNTER — Ambulatory Visit: Payer: BLUE CROSS/BLUE SHIELD | Admitting: Internal Medicine

## 2018-08-15 VITALS — BP 162/90 | HR 101 | Temp 98.2°F | Wt 215.1 lb

## 2018-08-15 DIAGNOSIS — R739 Hyperglycemia, unspecified: Secondary | ICD-10-CM | POA: Diagnosis not present

## 2018-08-15 DIAGNOSIS — R05 Cough: Secondary | ICD-10-CM

## 2018-08-15 DIAGNOSIS — I10 Essential (primary) hypertension: Secondary | ICD-10-CM | POA: Diagnosis not present

## 2018-08-15 DIAGNOSIS — Z79899 Other long term (current) drug therapy: Secondary | ICD-10-CM

## 2018-08-15 DIAGNOSIS — M6208 Separation of muscle (nontraumatic), other site: Secondary | ICD-10-CM

## 2018-08-15 DIAGNOSIS — K429 Umbilical hernia without obstruction or gangrene: Secondary | ICD-10-CM

## 2018-08-15 DIAGNOSIS — S39011A Strain of muscle, fascia and tendon of abdomen, initial encounter: Secondary | ICD-10-CM

## 2018-08-15 DIAGNOSIS — R058 Other specified cough: Secondary | ICD-10-CM

## 2018-08-15 NOTE — Patient Instructions (Addendum)
The seems like abd wall strain .    And splint and if getting  To chest pr fever then get back with [place].  You have a small  Umbilical hernia .   And if tender  We should have you see a surgeon for this.  Increase the Bp medication to take 2 50/12.5 per day  ( same time ok )   Get  Fasting  Lab  In 2-3 weeks after the change .  cpx  in about 6 weeks or therabouts  Hernia, Adult A hernia is the bulging of an organ or tissue through a weak spot in the muscles of the abdomen (abdominal wall). Hernias develop most often near the navel or groin. There are many kinds of hernias. Common kinds include:  Femoral hernia. This kind of hernia develops under the groin in the upper thigh area.  Inguinal hernia. This kind of hernia develops in the groin or scrotum.  Umbilical hernia. This kind of hernia develops near the navel.  Hiatal hernia. This kind of hernia causes part of the stomach to be pushed up into the chest.  Incisional hernia. This kind of hernia bulges through a scar from an abdominal surgery.  What are the causes? This condition may be caused by:  Heavy lifting.  Coughing over a long period of time.  Straining to have a bowel movement.  An incision made during an abdominal surgery.  A birth defect (congenital defect).  Excess weight or obesity.  Smoking.  Poor nutrition.  Cystic fibrosis.  Excess fluid in the abdomen.  Undescended testicles.  What are the signs or symptoms? Symptoms of a hernia include:  A lump on the abdomen. This is the first sign of a hernia. The lump may become more obvious with standing, straining, or coughing. It may get bigger over time if it is not treated or if the condition causing it is not treated.  Pain. A hernia is usually painless, but it may become painful over time if treatment is delayed. The pain is usually dull and may get worse with standing or lifting heavy objects.  Sometimes a hernia gets tightly squeezed in the weak spot  (strangulated) or stuck there (incarcerated) and causes additional symptoms. These symptoms may include:  Vomiting.  Nausea.  Constipation.  Irritability.  How is this diagnosed? A hernia may be diagnosed with:  A physical exam. During the exam your health care provider may ask you to cough or to make a specific movement, because a hernia is usually more visible when you move.  Imaging tests. These can include: ? X-rays. ? Ultrasound. ? CT scan.  How is this treated? A hernia that is small and painless may not need to be treated. A hernia that is large or painful may be treated with surgery. Inguinal hernias may be treated with surgery to prevent incarceration or strangulation. Strangulated hernias are always treated with surgery, because lack of blood to the trapped organ or tissue can cause it to die. Surgery to treat a hernia involves pushing the bulge back into place and repairing the weak part of the abdomen. Follow these instructions at home:  Avoid straining.  Do not lift anything heavier than 10 lb (4.5 kg).  Lift with your leg muscles, not your back muscles. This helps avoid strain.  When coughing, try to cough gently.  Prevent constipation. Constipation leads to straining with bowel movements, which can make a hernia worse or cause a hernia repair to break down. You  can prevent constipation by: ? Eating a high-fiber diet that includes plenty of fruits and vegetables. ? Drinking enough fluids to keep your urine clear or pale yellow. Aim to drink 6-8 glasses of water per day. ? Using a stool softener as directed by your health care provider.  Lose weight, if you are overweight.  Do not use any tobacco products, including cigarettes, chewing tobacco, or electronic cigarettes. If you need help quitting, ask your health care provider.  Keep all follow-up visits as directed by your health care provider. This is important. Your health care provider may need to monitor  your condition. Contact a health care provider if:  You have swelling, redness, and pain in the affected area.  Your bowel habits change. Get help right away if:  You have a fever.  You have abdominal pain that is getting worse.  You feel nauseous or you vomit.  You cannot push the hernia back in place by gently pressing on it while you are lying down.  The hernia: ? Changes in shape or size. ? Is stuck outside the abdomen. ? Becomes discolored. ? Feels hard or tender. This information is not intended to replace advice given to you by your health care provider. Make sure you discuss any questions you have with your health care provider. Document Released: 11/21/2005 Document Revised: 04/20/2016 Document Reviewed: 10/01/2014 Elsevier Interactive Patient Education  2017 [employer].

## 2018-08-15 NOTE — Progress Notes (Signed)
Chief Complaint  Patient presents with  . Cough    Cough and congestion is some better. Having some pain in upper center abdomen. Pt states that his abd feels distended and having localized stabbing pain when coughing - feels a buldge in center upper abd   . Hypertension    elevated BP x 3-44months. Takes meds daily    HPI:  Jonathan [name] 57 y.o. come in for SDA  Over due for cpx and labs   Was doing ok  Until  this past week when has sx of chest and head cold getting better but  yesateday  From dropping   Child at college   Had pain  seever    Felt like stabbing  Right side upper abd  Area    Pain .  No vomiting .    Pain with movement and cough otherwise not   umbi hernia area is stender a lot   bp was low visiting  Mom in June .   90 range . And thenw as up at the dentist and no se for meds reported    Om between jobs got laid off  ROS: See pertinent positives and negatives per HPI. No sob hemoptysis fever chills   Past Medical History:  Diagnosis Date  . Allergic rhinitis   . Anxiety   . Depression   . History of nephrolithiasis   . Hyperlipidemia   . Hypertension   . Sebaceous cyst     Family History  Problem Relation Age of Onset  . Diabetes Father   . Hypertension Father   . Kidney cancer Father        now on dialysis    Social History   Socioeconomic History  . Marital status: Married    Spouse name: Not on file  . Number of children: 1  . Years of education: Not on file  . Highest education level: Not on file  Occupational History  . Occupation: MINISTER    Employer: [employer]  Social Needs  . Financial resource strain: Not on file  . Food insecurity:    Worry: Not on file    Inability: Not on file  . Transportation needs:    Medical: Not on file    Non-medical: Not on file  Tobacco Use  . Smoking status: Never Smoker  . Smokeless tobacco: Never Used  Substance and Sexual Activity  . Alcohol use: Yes    Comment: less than 1 a  week  . Drug use: No  . Sexual activity: Yes  Lifestyle  . Physical activity:    Days per week: Not on file    Minutes per session: Not on file  . Stress: Not on file  Relationships  . Social connections:    Talks on phone: Not on file    Gets together: Not on file    Attends religious service: Not on file    Active member of club or organization: Not on file    Attends meetings of clubs or organizations: Not on file    Relationship status: Not on file  Other Topics Concern  . Not on file  Social History Narrative   Married   Regular exercise- no   HH of 3   Dog car and lizard   Sleep wakening at times   About 40 hours  Per week reg sleep.     Outpatient Medications Prior to Visit  Medication Sig Dispense Refill  . acetaminophen (TYLENOL)  325 MG tablet Take 650 mg by mouth every 6 (six) hours as needed.      . fluticasone (FLONASE) 50 MCG/ACT nasal spray Place 2 sprays daily into both nostrils. 16 g 6  . IBUPROFEN PO Take 400 mg by mouth as needed.    Marland Kitchen losartan-hydrochlorothiazide (HYZAAR) 50-12.5 MG tablet TAKE 1 TABLET BY MOUTH EVERY DAY 90 tablet 0  . Omega-3 Fatty Acids (FISH OIL PO) Take by mouth.    Marland Kitchen VITAMIN E PO Take by mouth.    Marland Kitchen azithromycin (ZITHROMAX) 250 MG tablet Take 2 pills on Day 1.  Then take 1 pill daily on days 2-4. (Patient not taking: Reported on 08/15/2018) 6 tablet 0  . HYDROcodone-homatropine (HYCODAN) 5-1.5 MG/5ML syrup Take 5 mLs by mouth every 8 (eight) hours as needed for cough. (Patient not taking: Reported on 08/15/2018) 120 mL 0  . predniSONE (DELTASONE) 10 MG tablet Take 5 tabs on day 1, 4 tabs on day 2, 3 tabs on day 3, 2 tabs on day 4, 1 tab on day 5. (Patient not taking: Reported on 08/15/2018) 15 tablet 0   No facility-administered medications prior to visit.      EXAM:  BP (!) 162/90 (BP Location: Right Arm, Patient Position: Sitting, Cuff Size: Normal)   Pulse (!) 101   Temp 98.2 F (36.8 C) (Oral)   Wt 215 lb 1.6 oz (97.6 kg)    SpO2 94%   BMI 33.69 kg/m   Body mass index is 33.69 kg/m. WDWN in NAD  quiet respirations; mildly congested  somewhat hoarse. Non toxic . HEENT: Normocephalic ;atraumatic , Eyes;  PERRL, EOMs  Full, lids and conjunctiva clear,,Ears: no deformities, canals nl, TM landmarks normal, Nose: no deformity or discharge but congested;face minimally tender Mouth : OP clear without lesion or edema . Neck: Supple without adenopathy or masses or bruits Chest:  Clear to A without wheezes rales or rhonchi CV:  S1-S2 no gallops or murmurs peripheral perfusion is normal Skin :nl perfusion and no acute rashes  Abdomen:  Sof,t normal bowel sounds without hepatosplenomegaly, no guarding rebound or masses no CVA tenderness  Tender  Right  Upper abd area with contration no masses   Small umbi hernia tender   And has diastasis examied standing nad laying   PSYCH: pleasant and cooperative, no obvious depression or anxiety  BP Readings from Last 3 Encounters:  08/15/18 (!) 162/90  11/23/17 (!) 152/90  10/23/17 140/80    ASSESSMENT AND PLAN:  Discussed the following assessment and plan:  Respiratory tract congestion with cough  Essential hypertension - not controlled today  - Plan: Basic metabolic panel, CBC with Differential/Platelet, Hemoglobin A1c, Hepatic function panel, Lipid panel  Medication management - Plan: Basic metabolic panel, CBC with Differential/Platelet, Hemoglobin A1c, Hepatic function panel, Lipid panel  Hyperglycemia - Plan: Basic metabolic panel, CBC with Differential/Platelet, Hemoglobin A1c, Hepatic function panel, Lipid panel  Strain of abdominal wall, initial encounter - most likely fom coughing   Umbilical hernia without obstruction and without gangrene  Diastasis recti Over due for  Med monitoring etc   For  hyperglycemia lipids and bp   Last visit w me 10 18   Double up on bp med 2 per day  And then labs in 2-3 weeks   cpx in 6 weeks or so  Or as needed   Will prob need  lipid management  and sugar control  -Patient advised to return or notify health care team  if  new  concerns arise.  Patient Instructions  The seems like abd wall strain .    And splint and if getting  To chest pr fever then get back with [place].  You have a small  Umbilical hernia .   And if tender  We should have you see a surgeon for this.  Increase the Bp medication to take 2 50/12.5 per day  ( same time ok )   Get  Fasting  Lab  In 2-3 weeks after the change .  cpx  in about 6 weeks or therabouts  Hernia, Adult A hernia is the bulging of an organ or tissue through a weak spot in the muscles of the abdomen (abdominal wall). Hernias develop most often near the navel or groin. There are many kinds of hernias. Common kinds include:  Femoral hernia. This kind of hernia develops under the groin in the upper thigh area.  Inguinal hernia. This kind of hernia develops in the groin or scrotum.  Umbilical hernia. This kind of hernia develops near the navel.  Hiatal hernia. This kind of hernia causes part of the stomach to be pushed up into the chest.  Incisional hernia. This kind of hernia bulges through a scar from an abdominal surgery.  What are the causes? This condition may be caused by:  Heavy lifting.  Coughing over a long period of time.  Straining to have a bowel movement.  An incision made during an abdominal surgery.  A birth defect (congenital defect).  Excess weight or obesity.  Smoking.  Poor nutrition.  Cystic fibrosis.  Excess fluid in the abdomen.  Undescended testicles.  What are the signs or symptoms? Symptoms of a hernia include:  A lump on the abdomen. This is the first sign of a hernia. The lump may become more obvious with standing, straining, or coughing. It may get bigger over time if it is not treated or if the condition causing it is not treated.  Pain. A hernia is usually painless, but it may become painful over time if treatment is delayed.  The pain is usually dull and may get worse with standing or lifting heavy objects.  Sometimes a hernia gets tightly squeezed in the weak spot (strangulated) or stuck there (incarcerated) and causes additional symptoms. These symptoms may include:  Vomiting.  Nausea.  Constipation.  Irritability.  How is this diagnosed? A hernia may be diagnosed with:  A physical exam. During the exam your health care provider may ask you to cough or to make a specific movement, because a hernia is usually more visible when you move.  Imaging tests. These can include: ? X-rays. ? Ultrasound. ? CT scan.  How is this treated? A hernia that is small and painless may not need to be treated. A hernia that is large or painful may be treated with surgery. Inguinal hernias may be treated with surgery to prevent incarceration or strangulation. Strangulated hernias are always treated with surgery, because lack of blood to the trapped organ or tissue can cause it to die. Surgery to treat a hernia involves pushing the bulge back into place and repairing the weak part of the abdomen. Follow these instructions at home:  Avoid straining.  Do not lift anything heavier than 10 lb (4.5 kg).  Lift with your leg muscles, not your back muscles. This helps avoid strain.  When coughing, try to cough gently.  Prevent constipation. Constipation leads to straining with bowel movements, which can make a hernia worse or cause a  hernia repair to break down. You can prevent constipation by: ? Eating a high-fiber diet that includes plenty of fruits and vegetables. ? Drinking enough fluids to keep your urine clear or pale yellow. Aim to drink 6-8 glasses of water per day. ? Using a stool softener as directed by your health care provider.  Lose weight, if you are overweight.  Do not use any tobacco products, including cigarettes, chewing tobacco, or electronic cigarettes. If you need help quitting, ask your health care  provider.  Keep all follow-up visits as directed by your health care provider. This is important. Your health care provider may need to monitor your condition. Contact a health care provider if:  You have swelling, redness, and pain in the affected area.  Your bowel habits change. Get help right away if:  You have a fever.  You have abdominal pain that is getting worse.  You feel nauseous or you vomit.  You cannot push the hernia back in place by gently pressing on it while you are lying down.  The hernia: ? Changes in shape or size. ? Is stuck outside the abdomen. ? Becomes discolored. ? Feels hard or tender. This information is not intended to replace advice given to you by your health care provider. Make sure you discuss any questions you have with your health care provider. Document Released: 11/21/2005 Document Revised: 04/20/2016 Document Reviewed: 10/01/2014 Elsevier Interactive Patient Education  2017 [place] K. Kanani Mowbray M.D.

## 2018-08-21 ENCOUNTER — Ambulatory Visit: Payer: BLUE CROSS/BLUE SHIELD | Admitting: Psychology

## 2018-08-21 DIAGNOSIS — F4323 Adjustment disorder with mixed anxiety and depressed mood: Secondary | ICD-10-CM

## 2018-09-05 ENCOUNTER — Other Ambulatory Visit (INDEPENDENT_AMBULATORY_CARE_PROVIDER_SITE_OTHER): Payer: BLUE CROSS/BLUE SHIELD

## 2018-09-05 DIAGNOSIS — Z79899 Other long term (current) drug therapy: Secondary | ICD-10-CM | POA: Diagnosis not present

## 2018-09-05 DIAGNOSIS — R739 Hyperglycemia, unspecified: Secondary | ICD-10-CM | POA: Diagnosis not present

## 2018-09-05 DIAGNOSIS — I10 Essential (primary) hypertension: Secondary | ICD-10-CM | POA: Diagnosis not present

## 2018-09-05 LAB — BASIC METABOLIC PANEL
BUN: 18 mg/dL (ref 6–23)
CO2: 30 meq/L (ref 19–32)
Calcium: 9.4 mg/dL (ref 8.4–10.5)
Chloride: 102 mEq/L (ref 96–112)
Creatinine, Ser: 1.24 mg/dL (ref 0.40–1.50)
GFR: 63.41 mL/min (ref >60.00)
GLUCOSE: 131 mg/dL — AB (ref 70–99)
POTASSIUM: 3.8 meq/L (ref 3.5–5.1)
SODIUM: 141 meq/L (ref 135–145)

## 2018-09-05 LAB — CBC WITH DIFFERENTIAL/PLATELET
BASOS ABS: 0.1 10*3/uL (ref 0.0–0.1)
Basophils Relative: 0.6 % (ref 0.0–3.0)
EOS PCT: 3 % (ref 0.0–5.0)
Eosinophils Absolute: 0.3 10*3/uL (ref 0.0–0.7)
HEMATOCRIT: 48.3 % (ref 39.0–52.0)
HEMOGLOBIN: 16.6 g/dL (ref 13.0–17.0)
Lymphocytes Relative: 31.7 % (ref 12.0–46.0)
Lymphs Abs: 3 10*3/uL (ref 0.7–4.0)
MCHC: 34.4 g/dL (ref 30.0–36.0)
MCV: 89.2 fl (ref 78.0–100.0)
MONO ABS: 0.9 10*3/uL (ref 0.1–1.0)
MONOS PCT: 9.8 % (ref 3.0–12.0)
NEUTROS ABS: 5.2 10*3/uL (ref 1.4–7.7)
Neutrophils Relative %: 54.9 % (ref 43.0–77.0)
PLATELETS: 238 10*3/uL (ref 150.0–400.0)
RBC: 5.41 Mil/uL (ref 4.22–5.81)
RDW: 13.3 % (ref 11.5–15.5)
WBC: 9.4 10*3/uL (ref 4.0–10.5)

## 2018-09-05 LAB — HEPATIC FUNCTION PANEL
ALT: 35 U/L (ref 0–53)
AST: 22 U/L (ref 0–37)
Albumin: 4.3 g/dL (ref 3.5–5.2)
Alkaline Phosphatase: 52 U/L (ref 39–117)
BILIRUBIN DIRECT: 0.1 mg/dL (ref 0.0–0.3)
Total Bilirubin: 0.8 mg/dL (ref 0.2–1.2)
Total Protein: 6.5 g/dL (ref 6.0–8.3)

## 2018-09-05 LAB — LIPID PANEL
CHOL/HDL RATIO: 5
CHOLESTEROL: 235 mg/dL — AB (ref 0–200)
HDL: 45.1 mg/dL (ref >39.00)
LDL CALC: 156 mg/dL — AB (ref 0–99)
NonHDL: 190.2
TRIGLYCERIDES: 172 mg/dL — AB (ref 0.0–149.0)
VLDL: 34.4 mg/dL (ref 0.0–40.0)

## 2018-09-05 LAB — HEMOGLOBIN A1C: HEMOGLOBIN A1C: 7.1 % — AB (ref 4.6–6.5)

## 2018-09-13 ENCOUNTER — Ambulatory Visit: Payer: BLUE CROSS/BLUE SHIELD | Admitting: Psychology

## 2018-09-13 DIAGNOSIS — F4323 Adjustment disorder with mixed anxiety and depressed mood: Secondary | ICD-10-CM | POA: Diagnosis not present

## 2018-09-21 ENCOUNTER — Other Ambulatory Visit: Payer: Self-pay | Admitting: Internal Medicine

## 2018-09-25 ENCOUNTER — Ambulatory Visit: Payer: BLUE CROSS/BLUE SHIELD | Admitting: Psychology

## 2018-09-25 DIAGNOSIS — F4323 Adjustment disorder with mixed anxiety and depressed mood: Secondary | ICD-10-CM

## 2018-09-26 ENCOUNTER — Encounter: Payer: Self-pay | Admitting: Internal Medicine

## 2018-09-28 NOTE — Progress Notes (Signed)
Chief Complaint  Patient presents with  . Annual Exam    Not fasting. Discuss labs - wants to have a PSA drawn in future. / Pain in right heel in AMs  . Hypertension  . Medication Management  . Follow-up    HPI: Patient  Jonathan [name]  57 y.o. comes in today for Preventive Health Care visit  And Chronic disease management  BP: tends to be   Anxiety level    Was  130/70s   BG:   Eating the same better .     hld  Has bad myalgias anw simva and hesitant to try another   Blurry eyes in am but had good eye check   r heel tender in am and walking worse in an no injury   Health Maintenance  Topic Date Due  . Hepatitis C Screening  11/[birth year]/1959  . PNEUMOCOCCAL POLYSACCHARIDE VACCINE AGE 63-64 HIGH RISK  08/17/1961  . OPHTHALMOLOGY EXAM  08/17/1969  . HIV Screening  08/17/1974  . FOOT EXAM  02/10/2016  . INFLUENZA VACCINE  03/06/2019 (Originally 07/05/2018)  . HEMOGLOBIN A1C  03/07/2019  . TETANUS/TDAP  03/31/2020  . COLONOSCOPY  07/05/2020   Health Maintenance Review LIFESTYLE:  Exercise:   More and more.  3 xper week  20 min tramp   Walking. Via work  Tobacco/ETS: no Alcohol:  Noto rare  Sugar beverages: Sleep:  7ish  Drug use: no HH of  2 Work: Friday begins new job.  Here.   40- 45 hours .    ROS:  GEN/ HEENT: No fever, significant weight changes sweats headaches vision problems hearing changes, CV/ PULM; No chest pain shortness of breath cough, syncope,edema  change in exercise tolerance. GI /GU: No adominal pain, vomiting, change in bowel habits. No blood in the stool. No significant GU symptoms. SKIN/HEME: ,no acute skin rashes suspicious lesions or bleeding. No lymphadenopathy, nodules, masses.  NEURO/ PSYCH:  No neurologic signs such as weakness numbness. No depression anxiety. IMM/ Allergy: No unusual infections.  Allergy .   REST of 12 system review negative except as per HPI   Past Medical History:  Diagnosis Date  . Allergic rhinitis   . Anxiety   .  Depression   . History of nephrolithiasis   . Hyperlipidemia   . Hypertension   . Sebaceous cyst     Past Surgical History:  Procedure Laterality Date  . kidney stones removed     x 2  . TONSILLECTOMY     x 2  . TYMPANOSTOMY TUBE PLACEMENT      Family History  Problem Relation Age of Onset  . Diabetes Father   . Hypertension Father   . Kidney cancer Father        now on dialysis    Social History   Socioeconomic History  . Marital status: Married    Spouse name: Not on file  . Number of children: 1  . Years of education: Not on file  . Highest education level: Not on file  Occupational History  . Occupation: MINISTER    Employer: [employer]  Social Needs  . Financial resource strain: Not on file  . Food insecurity:    Worry: Not on file    Inability: Not on file  . Transportation needs:    Medical: Not on file    Non-medical: Not on file  Tobacco Use  . Smoking status: Never Smoker  . Smokeless tobacco: Never Used  Substance and Sexual  Activity  . Alcohol use: Yes    Comment: less than 1 a week  . Drug use: No  . Sexual activity: Yes  Lifestyle  . Physical activity:    Days per week: Not on file    Minutes per session: Not on file  . Stress: Not on file  Relationships  . Social connections:    Talks on phone: Not on file    Gets together: Not on file    Attends religious service: Not on file    Active member of club or organization: Not on file    Attends meetings of clubs or organizations: Not on file    Relationship status: Not on file  Other Topics Concern  . Not on file  Social History Narrative   Married   Regular exercise- no   HH of 3   Dog car and lizard   Sleep wakening at times   New job [employer]   Per week reg sleep.     Outpatient Medications Prior to Visit  Medication Sig Dispense Refill  . acetaminophen (TYLENOL) 325 MG tablet Take 650 mg by mouth every 6 (six) hours as needed.      . fluticasone (FLONASE) 50  MCG/ACT nasal spray Place 2 sprays daily into both nostrils. 16 g 6  . IBUPROFEN PO Take 400 mg by mouth as needed.    . Omega-3 Fatty Acids (FISH OIL PO) Take by mouth.    Marland Kitchen VITAMIN E PO Take by mouth.    . losartan-hydrochlorothiazide (HYZAAR) 50-12.5 MG tablet TAKE 1 TABLET BY MOUTH EVERY DAY 90 tablet 0   No facility-administered medications prior to visit.      EXAM:  BP (!) 144/92 (BP Location: Left Arm, Patient Position: Standing, Cuff Size: Normal)   Pulse 90   Temp 97.9 F (36.6 C) (Oral)   Ht 5' 6.5" (1.689 m)   Wt 213 lb 12.8 oz (97 kg)   BMI 33.99 kg/m   Body mass index is 33.99 kg/m. Wt Readings from Last 3 Encounters:  10/01/18 213 lb 12.8 oz (97 kg)  08/15/18 215 lb 1.6 oz (97.6 kg)  11/23/17 213 lb 1.6 oz (96.7 kg)    Physical Exam: Vital signs reviewed NWG:NFAO is a well-developed well-nourished alert cooperative    who appearsr stated age in no acute distress.  HEENT: normocephalic atraumatic , Eyes: PERRL EOM's full, conjunctiva clear, Nares: paten,t no deformity discharge or tenderness., Ears: no deformity EAC's clear TMs with normal landmarks. Mouth: clear OP, no lesions, edema.  Moist mucous membranes. Dentition in adequate repair. NECK: supple without masses, thyromegaly or bruits. CHEST/PULM:  Clear to auscultation and percussion breath sounds equal no wheeze , rales or rhonchi. No chest wall deformities or tenderness. Breast: normal by inspection . No dimpling, discharge, masses, tenderness or discharge . CV: PMI is nondisplaced, S1 S2 no gallops, murmurs, rubs. Peripheral pulses are full without delay.No JVD .  ABDOMEN: Bowel sounds normal nontender  No guard or rebound, no hepato splenomegal no CVA tenderness.   Extremtities:  No clubbing cyanosis or edema, no acute joint swelling or redness no focal atrophy NEURO:  Oriented x3, cranial nerves 3-12 appear to be intact, no obvious focal weakness,gait within normal limits no abnormal reflexes or  asymmetrical SKIN: No acute rashes normal turgor, color, no bruising or petechiae. leesion left temple stuck on?  Left wrist pink purple  Fibroma lesion  Sun change   multile cloreations  PSYCH: Oriented, good eye contact, no  obvious depression anxiety, cognition and judgment appear normal. LN: no cervical axillary inguinal adenopathy Diabetic Foot Exam - Simple   Simple Foot Form Diabetic Foot exam was performed with the following findings:  Yes 10/01/2018  3:00 PM  Visual Inspection No deformities, no ulcerations, no other skin breakdown bilaterally:  Yes Sensation Testing Intact to touch and monofilament testing bilaterally:  Yes Pulse Check Posterior Tibialis and Dorsalis pulse intact bilaterally:  Yes Comments Small callous  Min tenderness  r heel  No lesions seen      Lab Results  Component Value Date   WBC 9.4 09/05/2018   HGB 16.6 09/05/2018   HCT 48.3 09/05/2018   PLT 238.0 09/05/2018   GLUCOSE 131 (H) 09/05/2018   CHOL 235 (H) 09/05/2018   TRIG 172.0 (H) 09/05/2018   HDL 45.10 09/05/2018   LDLDIRECT 190.2 12/11/2013   LDLCALC 156 (H) 09/05/2018   ALT 35 09/05/2018   AST 22 09/05/2018   NA 141 09/05/2018   K 3.8 09/05/2018   CL 102 09/05/2018   CREATININE 1.24 09/05/2018   BUN 18 09/05/2018   CO2 30 09/05/2018   TSH 0.78 05/24/2017   PSA 0.79 05/24/2017   HGBA1C 7.1 (H) 09/05/2018   MICROALBUR 1.0 06/01/2015    BP Readings from Last 3 Encounters:  10/01/18 (!) 144/92  08/15/18 (!) 162/90  11/23/17 (!) 152/90    Lab results reviewed with patient   ASSESSMENT AND PLAN:  Discussed the following assessment and plan:  Visit for preventive health examination  Medication management - Plan: Hemoglobin A1c, Lipid panel, Microalbumin / creatinine urine ratio, Basic metabolic panel  Essential hypertension - reported better at home 130 /70 range  continue for now - Plan: Hemoglobin A1c, Lipid panel, Microalbumin / creatinine urine ratio, Basic metabolic  panel  Hyperlipidemia, unspecified hyperlipidemia type - neds to try another statin  low dose as tolerated  - Plan: Hemoglobin A1c, Lipid panel, Microalbumin / creatinine urine ratio, Basic metabolic panel  Diabetes mellitus without complication (HCC) - no dx dm and add metformin  - Plan: Hemoglobin A1c, Lipid panel, Microalbumin / creatinine urine ratio, Basic metabolic panel  Screening PSA (prostate specific antigen) - Plan: PSA  Need for hepatitis C screening test - Plan: Hepatitis C antibody  Skin lesion -  anumber of sun changes and lesions best see derm checks   Heel pain, chronic, right   Expectant management. AndFU    Diabetes   Guidelines   Patient reluctant for meds but admits   information Patient Care Team: Tayson Schnelle, Neta Mends, MD as PCP - General Patient Instructions  I advise metformin   And statin medication   As discussed .  Continue lifestyle intervention healthy eating and exercise .   Begin metformin 500  Per day    And increas to 2 per day as tolerated   Labs tests in 3 months fasting lipid hg a1c psa and hep c screening .   Urine  micro albumin screening    Still want you to see dermatologist heel pain could be platarfasciitis     Health Maintenance, Male A healthy lifestyle and preventive care is important for your health and wellness. Ask your health care provider about what schedule of regular examinations is right for you. What should I know about weight and diet? Eat a Healthy Diet  Eat plenty of vegetables, fruits, whole grains, low-fat dairy products, and lean protein.  Do not eat a lot of foods high in solid fats, added sugars,  or salt.  Maintain a Healthy Weight Regular exercise can help you achieve or maintain a healthy weight. You should:  Do at least 150 minutes of exercise each week. The exercise should increase your heart rate and make you sweat (moderate-intensity exercise).  Do strength-training exercises at least twice a week.  Watch  Your Levels of Cholesterol and Blood Lipids  Have your blood tested for lipids and cholesterol every 5 years starting at 59 years of age. If you are at high risk for heart disease, you should start having your blood tested when you are 59 years old. You may need to have your cholesterol levels checked more often if: ? Your lipid or cholesterol levels are high. ? You are older than 59 years of age. ? You are at high risk for heart disease.  What should I know about cancer screening? Many types of cancers can be detected early and may often be prevented. Lung Cancer  You should be screened every year for lung cancer if: ? You are a current smoker who has smoked for at least 30 years. ? You are a former smoker who has quit within the past 15 years.  Talk to your health care provider about your screening options, when you should start screening, and how often you should be screened.  Colorectal Cancer  Routine colorectal cancer screening usually begins at 59 years of age and should be repeated every 5-10 years until you are 59 years old. You may need to be screened more often if early forms of precancerous polyps or small growths are found. Your health care provider may recommend screening at an earlier age if you have risk factors for colon cancer.  Your health care provider may recommend using home test kits to check for hidden blood in the stool.  A small camera at the end of a tube can be used to examine your colon (sigmoidoscopy or colonoscopy). This checks for the earliest forms of colorectal cancer.  Prostate and Testicular Cancer  Depending on your age and overall health, your health care provider may do certain tests to screen for prostate and testicular cancer.  Talk to your health care provider about any symptoms or concerns you have about testicular or prostate cancer.  Skin Cancer  Check your skin from head to toe regularly.  Tell your health care provider about any new  moles or changes in moles, especially if: ? There is a change in a mole's size, shape, or color. ? You have a mole that is larger than a pencil eraser.  Always use sunscreen. Apply sunscreen liberally and repeat throughout the day.  Protect yourself by wearing long sleeves, pants, a wide-brimmed hat, and sunglasses when outside.  What should I know about heart disease, diabetes, and high blood pressure?  If you are 71-20 years of age, have your blood pressure checked every 3-5 years. If you are 91 years of age or older, have your blood pressure checked every year. You should have your blood pressure measured twice-once when you are at a hospital or clinic, and once when you are not at a hospital or clinic. Record the average of the two measurements. To check your blood pressure when you are not at a hospital or clinic, you can use: ? An automated blood pressure machine at a pharmacy. ? A home blood pressure monitor.  Talk to your health care provider about your target blood pressure.  If you are between 62-42 years old, ask your health  care provider if you should take aspirin to prevent heart disease.  Have regular diabetes screenings by checking your fasting blood sugar level. ? If you are at a normal weight and have a low risk for diabetes, have this test once every three years after the age of 31. ? If you are overweight and have a high risk for diabetes, consider being tested at a younger age or more often.  A one-time screening for abdominal aortic aneurysm (AAA) by ultrasound is recommended for men aged 65-75 years who are current or former smokers. What should I know about preventing infection? Hepatitis B If you have a higher risk for hepatitis B, you should be screened for this virus. Talk with your health care provider to find out if you are at risk for hepatitis B infection. Hepatitis C Blood testing is recommended for:  Everyone born from [birth year] through 1967.  Anyone with  known risk factors for hepatitis C.  Sexually Transmitted Diseases (STDs)  You should be screened each year for STDs including gonorrhea and chlamydia if: ? You are sexually active and are younger than 59 years of age. ? You are older than 59 years of age and your health care provider tells you that you are at risk for this type of infection. ? Your sexual activity has changed since you were last screened and you are at an increased risk for chlamydia or gonorrhea. Ask your health care provider if you are at risk.  Talk with your health care provider about whether you are at high risk of being infected with HIV. Your health care provider may recommend a prescription medicine to help prevent HIV infection.  What else can I do?  Schedule regular health, dental, and eye exams.  Stay current with your vaccines (immunizations).  Do not use any tobacco products, such as cigarettes, chewing tobacco, and e-cigarettes. If you need help quitting, ask your health care provider.  Limit alcohol intake to no more than 2 drinks per day. One drink equals 12 ounces of beer, 5 ounces of wine, or 1 ounces of hard liquor.  Do not use street drugs.  Do not share needles.  Ask your health care provider for help if you need support or information about quitting drugs.  Tell your health care provider if you often feel depressed.  Tell your health care provider if you have ever been abused or do not feel safe at home. This information is not intended to replace advice given to you by your health care provider. Make sure you discuss any questions you have with your health care provider. Document Released: 05/19/2008 Document Revised: 07/20/2016 Document Reviewed: 08/25/2015 Elsevier Interactive Patient Education  2018 [employer]   Plantar Fasciitis Plantar fasciitis is a painful foot condition that affects the heel. It occurs when the band of tissue that connects the toes to the heel bone (plantar  fascia) becomes irritated. This can happen after exercising too much or doing other repetitive activities (overuse injury). The pain from plantar fasciitis can range from mild irritation to severe pain that makes it difficult for you to walk or move. The pain is usually worse in the morning or after you have been sitting or lying down for a while. What are the causes? This condition may be caused by:  Standing for long periods of time.  Wearing shoes that do not fit.  Doing high-impact activities, including running, aerobics, and ballet.  Being overweight.  Having an abnormal way of walking (gait).  Having tight calf muscles.  Having high arches in your feet.  Starting a new athletic activity.  What are the signs or symptoms? The main symptom of this condition is heel pain. Other symptoms include:  Pain that gets worse after activity or exercise.  Pain that is worse in the morning or after resting.  Pain that goes away after you walk for a few minutes.  How is this diagnosed? This condition may be diagnosed based on your signs and symptoms. Your health care provider will also do a physical exam to check for:  A tender area on the bottom of your foot.  A high arch in your foot.  Pain when you move your foot.  Difficulty moving your foot.  You may also need to have imaging studies to confirm the diagnosis. These can include:  X-rays.  Ultrasound.  MRI.  How is this treated? Treatment for plantar fasciitis depends on the severity of the condition. Your treatment may include:  Rest, ice, and over-the-counter pain medicines to manage your pain.  Exercises to stretch your calves and your plantar fascia.  A splint that holds your foot in a stretched, upward position while you sleep (night splint).  Physical therapy to relieve symptoms and prevent problems in the future.  Cortisone injections to relieve severe pain.  Extracorporeal shock wave therapy (ESWT) to  stimulate damaged plantar fascia with electrical impulses. It is often used as a last resort before surgery.  Surgery, if other treatments have not worked after 12 months.  Follow these instructions at home:  Take medicines only as directed by your health care provider.  Avoid activities that cause pain.  Roll the bottom of your foot over a bag of ice or a bottle of cold water. Do this for 20 minutes, 3-4 times a day.  Perform simple stretches as directed by your health care provider.  Try wearing athletic shoes with air-sole or gel-sole cushions or soft shoe inserts.  Wear a night splint while sleeping, if directed by your health care provider.  Keep all follow-up appointments with your health care provider. How is this prevented?  Do not perform exercises or activities that cause heel pain.  Consider finding low-impact activities if you continue to have problems.  Lose weight if you need to. The best way to prevent plantar fasciitis is to avoid the activities that aggravate your plantar fascia. Contact a health care provider if:  Your symptoms do not go away after treatment with home care measures.  Your pain gets worse.  Your pain affects your ability to move or do your daily activities. This information is not intended to replace advice given to you by your health care provider. Make sure you discuss any questions you have with your health care provider. Document Released: 08/16/2001 Document Revised: 04/25/2016 Document Reviewed: 10/01/2014 Elsevier Interactive Patient Education  2018 [employer].     [place]. Romilda Proby M.D.

## 2018-10-01 ENCOUNTER — Encounter: Payer: Self-pay | Admitting: Internal Medicine

## 2018-10-01 ENCOUNTER — Ambulatory Visit (INDEPENDENT_AMBULATORY_CARE_PROVIDER_SITE_OTHER): Payer: BLUE CROSS/BLUE SHIELD | Admitting: Internal Medicine

## 2018-10-01 VITALS — BP 144/92 | HR 90 | Temp 97.9°F | Ht 66.5 in | Wt 213.8 lb

## 2018-10-01 DIAGNOSIS — Z125 Encounter for screening for malignant neoplasm of prostate: Secondary | ICD-10-CM

## 2018-10-01 DIAGNOSIS — Z0000 Encounter for general adult medical examination without abnormal findings: Secondary | ICD-10-CM | POA: Diagnosis not present

## 2018-10-01 DIAGNOSIS — Z79899 Other long term (current) drug therapy: Secondary | ICD-10-CM

## 2018-10-01 DIAGNOSIS — E119 Type 2 diabetes mellitus without complications: Secondary | ICD-10-CM

## 2018-10-01 DIAGNOSIS — E785 Hyperlipidemia, unspecified: Secondary | ICD-10-CM

## 2018-10-01 DIAGNOSIS — I10 Essential (primary) hypertension: Secondary | ICD-10-CM

## 2018-10-01 DIAGNOSIS — Z1159 Encounter for screening for other viral diseases: Secondary | ICD-10-CM

## 2018-10-01 DIAGNOSIS — M79671 Pain in right foot: Secondary | ICD-10-CM

## 2018-10-01 DIAGNOSIS — G8929 Other chronic pain: Secondary | ICD-10-CM

## 2018-10-01 DIAGNOSIS — L989 Disorder of the skin and subcutaneous tissue, unspecified: Secondary | ICD-10-CM

## 2018-10-01 MED ORDER — LOSARTAN POTASSIUM-HCTZ 100-25 MG PO TABS: 1.0000 | ORAL_TABLET | Freq: Every day | ORAL | 3 refills | Status: DC

## 2018-10-01 MED ORDER — ROSUVASTATIN CALCIUM 5 MG PO TABS: 5.0000 mg | ORAL_TABLET | Freq: Every day | ORAL | 1 refills | Status: DC

## 2018-10-01 MED ORDER — METFORMIN HCL ER 500 MG PO TB24: 500.0000 mg | ORAL_TABLET | Freq: Every day | ORAL | 5 refills | Status: DC

## 2018-10-01 NOTE — Patient Instructions (Addendum)
I advise metformin   And statin medication   As discussed .  Continue lifestyle intervention healthy eating and exercise .   Begin metformin 500  Per day    And increas to 2 per day as tolerated   Labs tests in 3 months fasting lipid hg a1c psa and hep c screening .   Urine  micro albumin screening    Still want you to see dermatologist heel pain could be platarfasciitis     Health Maintenance, Male A healthy lifestyle and preventive care is important for your health and wellness. Ask your health care provider about what schedule of regular examinations is right for you. What should I know about weight and diet? Eat a Healthy Diet  Eat plenty of vegetables, fruits, whole grains, low-fat dairy products, and lean protein.  Do not eat a lot of foods high in solid fats, added sugars, or salt.  Maintain a Healthy Weight Regular exercise can help you achieve or maintain a healthy weight. You should:  Do at least 150 minutes of exercise each week. The exercise should increase your heart rate and make you sweat (moderate-intensity exercise).  Do strength-training exercises at least twice a week.  Watch Your Levels of Cholesterol and Blood Lipids  Have your blood tested for lipids and cholesterol every 5 years starting at 59 years of age. If you are at high risk for heart disease, you should start having your blood tested when you are 58 years old. You may need to have your cholesterol levels checked more often if: ? Your lipid or cholesterol levels are high. ? You are older than 59 years of age. ? You are at high risk for heart disease.  What should I know about cancer screening? Many types of cancers can be detected early and may often be prevented. Lung Cancer  You should be screened every year for lung cancer if: ? You are a current smoker who has smoked for at least 30 years. ? You are a former smoker who has quit within the past 15 years.  Talk to your health care provider  about your screening options, when you should start screening, and how often you should be screened.  Colorectal Cancer  Routine colorectal cancer screening usually begins at 59 years of age and should be repeated every 5-10 years until you are 59 years old. You may need to be screened more often if early forms of precancerous polyps or small growths are found. Your health care provider may recommend screening at an earlier age if you have risk factors for colon cancer.  Your health care provider may recommend using home test kits to check for hidden blood in the stool.  A small camera at the end of a tube can be used to examine your colon (sigmoidoscopy or colonoscopy). This checks for the earliest forms of colorectal cancer.  Prostate and Testicular Cancer  Depending on your age and overall health, your health care provider may do certain tests to screen for prostate and testicular cancer.  Talk to your health care provider about any symptoms or concerns you have about testicular or prostate cancer.  Skin Cancer  Check your skin from head to toe regularly.  Tell your health care provider about any new moles or changes in moles, especially if: ? There is a change in a mole's size, shape, or color. ? You have a mole that is larger than a pencil eraser.  Always use sunscreen. Apply sunscreen liberally and  repeat throughout the day.  Protect yourself by wearing long sleeves, pants, a wide-brimmed hat, and sunglasses when outside.  What should I know about heart disease, diabetes, and high blood pressure?  If you are 86-60 years of age, have your blood pressure checked every 3-5 years. If you are 64 years of age or older, have your blood pressure checked every year. You should have your blood pressure measured twice-once when you are at a hospital or clinic, and once when you are not at a hospital or clinic. Record the average of the two measurements. To check your blood pressure when you  are not at a hospital or clinic, you can use: ? An automated blood pressure machine at a pharmacy. ? A home blood pressure monitor.  Talk to your health care provider about your target blood pressure.  If you are between 30-14 years old, ask your health care provider if you should take aspirin to prevent heart disease.  Have regular diabetes screenings by checking your fasting blood sugar level. ? If you are at a normal weight and have a low risk for diabetes, have this test once every three years after the age of 19. ? If you are overweight and have a high risk for diabetes, consider being tested at a younger age or more often.  A one-time screening for abdominal aortic aneurysm (AAA) by ultrasound is recommended for men aged 2-75 years who are current or former smokers. What should I know about preventing infection? Hepatitis B If you have a higher risk for hepatitis B, you should be screened for this virus. Talk with your health care provider to find out if you are at risk for hepatitis B infection. Hepatitis C Blood testing is recommended for:  Everyone born from [birth year] through 1967.  Anyone with known risk factors for hepatitis C.  Sexually Transmitted Diseases (STDs)  You should be screened each year for STDs including gonorrhea and chlamydia if: ? You are sexually active and are younger than 59 years of age. ? You are older than 59 years of age and your health care provider tells you that you are at risk for this type of infection. ? Your sexual activity has changed since you were last screened and you are at an increased risk for chlamydia or gonorrhea. Ask your health care provider if you are at risk.  Talk with your health care provider about whether you are at high risk of being infected with HIV. Your health care provider may recommend a prescription medicine to help prevent HIV infection.  What else can I do?  Schedule regular health, dental, and eye exams.  Stay  current with your vaccines (immunizations).  Do not use any tobacco products, such as cigarettes, chewing tobacco, and e-cigarettes. If you need help quitting, ask your health care provider.  Limit alcohol intake to no more than 2 drinks per day. One drink equals 12 ounces of beer, 5 ounces of wine, or 1 ounces of hard liquor.  Do not use street drugs.  Do not share needles.  Ask your health care provider for help if you need support or information about quitting drugs.  Tell your health care provider if you often feel depressed.  Tell your health care provider if you have ever been abused or do not feel safe at home. This information is not intended to replace advice given to you by your health care provider. Make sure you discuss any questions you have with your health care  provider. Document Released: 05/19/2008 Document Revised: 07/20/2016 Document Reviewed: 08/25/2015 Elsevier Interactive Patient Education  2018 [employer].   Plantar Fasciitis Plantar fasciitis is a painful foot condition that affects the heel. It occurs when the band of tissue that connects the toes to the heel bone (plantar fascia) becomes irritated. This can happen after exercising too much or doing other repetitive activities (overuse injury). The pain from plantar fasciitis can range from mild irritation to severe pain that makes it difficult for you to walk or move. The pain is usually worse in the morning or after you have been sitting or lying down for a while. What are the causes? This condition may be caused by:  Standing for long periods of time.  Wearing shoes that do not fit.  Doing high-impact activities, including running, aerobics, and ballet.  Being overweight.  Having an abnormal way of walking (gait).  Having tight calf muscles.  Having high arches in your feet.  Starting a new athletic activity.  What are the signs or symptoms? The main symptom of this condition is heel pain.  Other symptoms include:  Pain that gets worse after activity or exercise.  Pain that is worse in the morning or after resting.  Pain that goes away after you walk for a few minutes.  How is this diagnosed? This condition may be diagnosed based on your signs and symptoms. Your health care provider will also do a physical exam to check for:  A tender area on the bottom of your foot.  A high arch in your foot.  Pain when you move your foot.  Difficulty moving your foot.  You may also need to have imaging studies to confirm the diagnosis. These can include:  X-rays.  Ultrasound.  MRI.  How is this treated? Treatment for plantar fasciitis depends on the severity of the condition. Your treatment may include:  Rest, ice, and over-the-counter pain medicines to manage your pain.  Exercises to stretch your calves and your plantar fascia.  A splint that holds your foot in a stretched, upward position while you sleep (night splint).  Physical therapy to relieve symptoms and prevent problems in the future.  Cortisone injections to relieve severe pain.  Extracorporeal shock wave therapy (ESWT) to stimulate damaged plantar fascia with electrical impulses. It is often used as a last resort before surgery.  Surgery, if other treatments have not worked after 12 months.  Follow these instructions at home:  Take medicines only as directed by your health care provider.  Avoid activities that cause pain.  Roll the bottom of your foot over a bag of ice or a bottle of cold water. Do this for 20 minutes, 3-4 times a day.  Perform simple stretches as directed by your health care provider.  Try wearing athletic shoes with air-sole or gel-sole cushions or soft shoe inserts.  Wear a night splint while sleeping, if directed by your health care provider.  Keep all follow-up appointments with your health care provider. How is this prevented?  Do not perform exercises or activities that  cause heel pain.  Consider finding low-impact activities if you continue to have problems.  Lose weight if you need to. The best way to prevent plantar fasciitis is to avoid the activities that aggravate your plantar fascia. Contact a health care provider if:  Your symptoms do not go away after treatment with home care measures.  Your pain gets worse.  Your pain affects your ability to move or do your  daily activities. This information is not intended to replace advice given to you by your health care provider. Make sure you discuss any questions you have with your health care provider. Document Released: 08/16/2001 Document Revised: 04/25/2016 Document Reviewed: 10/01/2014 Elsevier Interactive Patient Education  [employer].

## 2018-10-15 ENCOUNTER — Ambulatory Visit: Payer: BLUE CROSS/BLUE SHIELD | Admitting: Psychology

## 2018-10-15 DIAGNOSIS — F4323 Adjustment disorder with mixed anxiety and depressed mood: Secondary | ICD-10-CM | POA: Diagnosis not present

## 2018-11-08 ENCOUNTER — Ambulatory Visit: Payer: BLUE CROSS/BLUE SHIELD | Admitting: Psychology

## 2018-12-06 ENCOUNTER — Ambulatory Visit: Payer: BLUE CROSS/BLUE SHIELD | Admitting: Psychology

## 2018-12-06 DIAGNOSIS — F4323 Adjustment disorder with mixed anxiety and depressed mood: Secondary | ICD-10-CM | POA: Diagnosis not present

## 2018-12-17 ENCOUNTER — Ambulatory Visit: Payer: BLUE CROSS/BLUE SHIELD | Admitting: Psychology

## 2018-12-17 DIAGNOSIS — F4323 Adjustment disorder with mixed anxiety and depressed mood: Secondary | ICD-10-CM | POA: Diagnosis not present

## 2019-01-02 ENCOUNTER — Ambulatory Visit: Payer: BLUE CROSS/BLUE SHIELD | Admitting: Psychology

## 2019-01-02 DIAGNOSIS — F4323 Adjustment disorder with mixed anxiety and depressed mood: Secondary | ICD-10-CM | POA: Diagnosis not present

## 2019-01-03 NOTE — Progress Notes (Deleted)
No chief complaint on file.   HPI: Jonathan [name] 58 y.o. come in for Chronic disease management  Was supposed to get lab in 3 mos but  Not done yet   Fu from pv and labs  ROS: See pertinent positives and negatives per HPI.  Past Medical History:  Diagnosis Date  . Allergic rhinitis   . Anxiety   . Depression   . History of nephrolithiasis   . Hyperlipidemia   . Hypertension   . Sebaceous cyst     Family History  Problem Relation Age of Onset  . Diabetes Father   . Hypertension Father   . Kidney cancer Father        now on dialysis    Social History   Socioeconomic History  . Marital status: Married    Spouse name: Not on file  . Number of children: 1  . Years of education: Not on file  . Highest education level: Not on file  Occupational History  . Occupation: MINISTER    Employer: [employer]  Social Needs  . Financial resource strain: Not on file  . Food insecurity:    Worry: Not on file    Inability: Not on file  . Transportation needs:    Medical: Not on file    Non-medical: Not on file  Tobacco Use  . Smoking status: Never Smoker  . Smokeless tobacco: Never Used  Substance and Sexual Activity  . Alcohol use: Yes    Comment: less than 1 a week  . Drug use: No  . Sexual activity: Yes  Lifestyle  . Physical activity:    Days per week: Not on file    Minutes per session: Not on file  . Stress: Not on file  Relationships  . Social connections:    Talks on phone: Not on file    Gets together: Not on file    Attends religious service: Not on file    Active member of club or organization: Not on file    Attends meetings of clubs or organizations: Not on file    Relationship status: Not on file  Other Topics Concern  . Not on file  Social History Narrative   Married   Regular exercise- no   HH of 3   Dog car and lizard   Sleep wakening at times   New job [employer]   Per week reg sleep.     Outpatient Medications Prior  to Visit  Medication Sig Dispense Refill  . acetaminophen (TYLENOL) 325 MG tablet Take 650 mg by mouth every 6 (six) hours as needed.      . fluticasone (FLONASE) 50 MCG/ACT nasal spray Place 2 sprays daily into both nostrils. 16 g 6  . IBUPROFEN PO Take 400 mg by mouth as needed.    Marland Kitchen losartan-hydrochlorothiazide (HYZAAR) 100-25 MG tablet Take 1 tablet by mouth daily. 90 tablet 3  . metFORMIN (GLUCOPHAGE-XR) 500 MG 24 hr tablet Take 1 tablet (500 mg total) by mouth daily with breakfast. Increase to bid after 2 weeks 60 tablet 5  . Omega-3 Fatty Acids (FISH OIL PO) Take by mouth.    . rosuvastatin (CRESTOR) 5 MG tablet Take 1 tablet (5 mg total) by mouth daily. For cholesterol 90 tablet 1  . VITAMIN E PO Take by mouth.     No facility-administered medications prior to visit.      EXAM:  There were no vitals taken for this visit.  There  is no height or weight on file to calculate BMI.  GENERAL: vitals reviewed and listed above, alert, oriented, appears well hydrated and in no acute distress HEENT: atraumatic, conjunctiva  clear, no obvious abnormalities on inspection of external nose and ears OP : no lesion edema or exudate  NECK: no obvious masses on inspection palpation  LUNGS: clear to auscultation bilaterally, no wheezes, rales or rhonchi, good air movement CV: HRRR, no clubbing cyanosis or  peripheral edema nl cap refill  MS: moves all extremities without noticeable focal  abnormality PSYCH: pleasant and cooperative, no obvious depression or anxiety Lab Results  Component Value Date   WBC 9.4 09/05/2018   HGB 16.6 09/05/2018   HCT 48.3 09/05/2018   PLT 238.0 09/05/2018   GLUCOSE 131 (H) 09/05/2018   CHOL 235 (H) 09/05/2018   TRIG 172.0 (H) 09/05/2018   HDL 45.10 09/05/2018   LDLDIRECT 190.2 12/11/2013   LDLCALC 156 (H) 09/05/2018   ALT 35 09/05/2018   AST 22 09/05/2018   NA 141 09/05/2018   K 3.8 09/05/2018   CL 102 09/05/2018   CREATININE 1.24 09/05/2018   BUN 18  09/05/2018   CO2 30 09/05/2018   TSH 0.78 05/24/2017   PSA 0.79 05/24/2017   HGBA1C 7.1 (H) 09/05/2018   MICROALBUR 1.0 06/01/2015   BP Readings from Last 3 Encounters:  10/01/18 (!) 144/92  08/15/18 (!) 162/90  11/23/17 (!) 152/90    ASSESSMENT AND PLAN:  Discussed the following assessment and plan:  Medication management  Essential hypertension  Hyperlipidemia, unspecified hyperlipidemia type  Diabetes mellitus without complication ([place])  -Patient advised to return or notify health care team  if  new concerns arise.  There are no Patient Instructions on file for this visit.   Standley Brooking. Ersa Delaney M.D.

## 2019-01-04 ENCOUNTER — Ambulatory Visit: Payer: BLUE CROSS/BLUE SHIELD | Admitting: Internal Medicine

## 2019-01-04 ENCOUNTER — Other Ambulatory Visit (INDEPENDENT_AMBULATORY_CARE_PROVIDER_SITE_OTHER): Payer: BLUE CROSS/BLUE SHIELD

## 2019-01-04 DIAGNOSIS — E119 Type 2 diabetes mellitus without complications: Secondary | ICD-10-CM

## 2019-01-04 DIAGNOSIS — E785 Hyperlipidemia, unspecified: Secondary | ICD-10-CM

## 2019-01-04 DIAGNOSIS — Z125 Encounter for screening for malignant neoplasm of prostate: Secondary | ICD-10-CM

## 2019-01-04 DIAGNOSIS — I10 Essential (primary) hypertension: Secondary | ICD-10-CM

## 2019-01-04 DIAGNOSIS — Z1159 Encounter for screening for other viral diseases: Secondary | ICD-10-CM | POA: Diagnosis not present

## 2019-01-04 DIAGNOSIS — Z79899 Other long term (current) drug therapy: Secondary | ICD-10-CM | POA: Diagnosis not present

## 2019-01-04 LAB — LIPID PANEL
Cholesterol: 222 mg/dL — ABNORMAL HIGH (ref 0–200)
HDL: 42.5 mg/dL (ref >39.00)
LDL Cholesterol: 148 mg/dL — ABNORMAL HIGH (ref 0–99)
NONHDL: 179.27
Total CHOL/HDL Ratio: 5
Triglycerides: 157 mg/dL — ABNORMAL HIGH (ref 0.0–149.0)
VLDL: 31.4 mg/dL (ref 0.0–40.0)

## 2019-01-04 LAB — BASIC METABOLIC PANEL
BUN: 18 mg/dL (ref 6–23)
CO2: 28 mEq/L (ref 19–32)
Calcium: 9.5 mg/dL (ref 8.4–10.5)
Chloride: 99 mEq/L (ref 96–112)
Creatinine, Ser: 1.25 mg/dL (ref 0.40–1.50)
GFR: 59.04 mL/min — ABNORMAL LOW (ref >60.00)
Glucose, Bld: 127 mg/dL — ABNORMAL HIGH (ref 70–99)
Potassium: 3.8 mEq/L (ref 3.5–5.1)
Sodium: 137 mEq/L (ref 135–145)

## 2019-01-04 LAB — PSA: PSA: 0.84 ng/mL (ref 0.10–4.00)

## 2019-01-04 LAB — MICROALBUMIN / CREATININE URINE RATIO
CREATININE, U: 203.3 mg/dL
MICROALB UR: 0.9 mg/dL (ref 0.0–1.9)
MICROALB/CREAT RATIO: 0.5 mg/g (ref 0.0–30.0)

## 2019-01-04 LAB — HEMOGLOBIN A1C: HEMOGLOBIN A1C: 6.6 % — AB (ref 4.6–6.5)

## 2019-01-05 LAB — HEPATITIS C ANTIBODY
Hepatitis C Ab: NONREACTIVE
SIGNAL TO CUT-OFF: 0.01 (ref <1.00)

## 2019-01-16 ENCOUNTER — Ambulatory Visit: Payer: BLUE CROSS/BLUE SHIELD | Admitting: Psychology

## 2019-01-16 DIAGNOSIS — F4323 Adjustment disorder with mixed anxiety and depressed mood: Secondary | ICD-10-CM

## 2019-01-31 ENCOUNTER — Ambulatory Visit: Payer: BLUE CROSS/BLUE SHIELD | Admitting: Psychology

## 2019-01-31 DIAGNOSIS — F4322 Adjustment disorder with anxiety: Secondary | ICD-10-CM | POA: Diagnosis not present

## 2019-02-13 ENCOUNTER — Ambulatory Visit: Payer: BLUE CROSS/BLUE SHIELD | Admitting: Psychology

## 2019-02-13 ENCOUNTER — Other Ambulatory Visit: Payer: Self-pay

## 2019-02-13 DIAGNOSIS — F4323 Adjustment disorder with mixed anxiety and depressed mood: Secondary | ICD-10-CM

## 2019-02-28 ENCOUNTER — Ambulatory Visit: Payer: BLUE CROSS/BLUE SHIELD | Admitting: Psychology

## 2019-03-20 ENCOUNTER — Ambulatory Visit (INDEPENDENT_AMBULATORY_CARE_PROVIDER_SITE_OTHER): Payer: BLUE CROSS/BLUE SHIELD | Admitting: Psychology

## 2019-03-20 DIAGNOSIS — F4323 Adjustment disorder with mixed anxiety and depressed mood: Secondary | ICD-10-CM

## 2019-03-21 ENCOUNTER — Other Ambulatory Visit: Payer: Self-pay | Admitting: Internal Medicine

## 2019-04-05 ENCOUNTER — Ambulatory Visit (INDEPENDENT_AMBULATORY_CARE_PROVIDER_SITE_OTHER): Payer: BLUE CROSS/BLUE SHIELD | Admitting: Psychology

## 2019-04-05 DIAGNOSIS — F4323 Adjustment disorder with mixed anxiety and depressed mood: Secondary | ICD-10-CM | POA: Diagnosis not present

## 2019-04-24 ENCOUNTER — Ambulatory Visit (INDEPENDENT_AMBULATORY_CARE_PROVIDER_SITE_OTHER): Payer: BLUE CROSS/BLUE SHIELD | Admitting: Psychology

## 2019-04-24 DIAGNOSIS — F4323 Adjustment disorder with mixed anxiety and depressed mood: Secondary | ICD-10-CM

## 2019-05-08 ENCOUNTER — Ambulatory Visit (INDEPENDENT_AMBULATORY_CARE_PROVIDER_SITE_OTHER): Payer: BC Managed Care – PPO | Admitting: Psychology

## 2019-05-08 DIAGNOSIS — F4323 Adjustment disorder with mixed anxiety and depressed mood: Secondary | ICD-10-CM | POA: Diagnosis not present

## 2019-05-22 ENCOUNTER — Ambulatory Visit (INDEPENDENT_AMBULATORY_CARE_PROVIDER_SITE_OTHER): Payer: BC Managed Care – PPO | Admitting: Psychology

## 2019-05-22 DIAGNOSIS — F4323 Adjustment disorder with mixed anxiety and depressed mood: Secondary | ICD-10-CM | POA: Diagnosis not present

## 2019-06-05 ENCOUNTER — Ambulatory Visit: Payer: BLUE CROSS/BLUE SHIELD | Admitting: Psychology

## 2019-06-14 ENCOUNTER — Ambulatory Visit (INDEPENDENT_AMBULATORY_CARE_PROVIDER_SITE_OTHER): Payer: BC Managed Care – PPO | Admitting: Psychology

## 2019-06-14 DIAGNOSIS — F4323 Adjustment disorder with mixed anxiety and depressed mood: Secondary | ICD-10-CM | POA: Diagnosis not present

## 2019-06-19 ENCOUNTER — Ambulatory Visit (INDEPENDENT_AMBULATORY_CARE_PROVIDER_SITE_OTHER): Payer: BC Managed Care – PPO | Admitting: Psychology

## 2019-06-19 DIAGNOSIS — F4323 Adjustment disorder with mixed anxiety and depressed mood: Secondary | ICD-10-CM | POA: Diagnosis not present

## 2019-07-03 ENCOUNTER — Ambulatory Visit (INDEPENDENT_AMBULATORY_CARE_PROVIDER_SITE_OTHER): Payer: BC Managed Care – PPO | Admitting: Psychology

## 2019-07-03 DIAGNOSIS — F4323 Adjustment disorder with mixed anxiety and depressed mood: Secondary | ICD-10-CM

## 2019-07-17 ENCOUNTER — Ambulatory Visit (INDEPENDENT_AMBULATORY_CARE_PROVIDER_SITE_OTHER): Payer: BC Managed Care – PPO | Admitting: Psychology

## 2019-07-17 DIAGNOSIS — F4323 Adjustment disorder with mixed anxiety and depressed mood: Secondary | ICD-10-CM

## 2019-07-31 ENCOUNTER — Ambulatory Visit (INDEPENDENT_AMBULATORY_CARE_PROVIDER_SITE_OTHER): Payer: BC Managed Care – PPO | Admitting: Psychology

## 2019-07-31 DIAGNOSIS — F4323 Adjustment disorder with mixed anxiety and depressed mood: Secondary | ICD-10-CM

## 2019-08-02 ENCOUNTER — Telehealth (INDEPENDENT_AMBULATORY_CARE_PROVIDER_SITE_OTHER): Payer: BC Managed Care – PPO | Admitting: Internal Medicine

## 2019-08-02 ENCOUNTER — Other Ambulatory Visit: Payer: Self-pay

## 2019-08-02 ENCOUNTER — Encounter: Payer: Self-pay | Admitting: Internal Medicine

## 2019-08-02 DIAGNOSIS — Z79899 Other long term (current) drug therapy: Secondary | ICD-10-CM | POA: Diagnosis not present

## 2019-08-02 DIAGNOSIS — F4323 Adjustment disorder with mixed anxiety and depressed mood: Secondary | ICD-10-CM

## 2019-08-02 DIAGNOSIS — E119 Type 2 diabetes mellitus without complications: Secondary | ICD-10-CM | POA: Diagnosis not present

## 2019-08-02 MED ORDER — CITALOPRAM HYDROBROMIDE 10 MG PO TABS: 10.0000 mg | ORAL_TABLET | Freq: Every day | ORAL | 1 refills | Status: DC

## 2019-08-02 NOTE — Progress Notes (Signed)
Virtual Visit via Video Note  I connected with@ on 08/02/19 at  2:00 PM EDT by a video enabled telemedicine application and verified that I am speaking with the correct person using two identifiers. Location patient: home Location provider:work  office Persons participating in the virtual visit: patient, provider  [place] national recommendations  regarding COVID 19 pandemic   video visit is advised over in office visit for this patient.  Patient aware  of the limitations of evaluation and management by telemedicine and  availability of in person appointments. and agreed to proceed.   HPI: Jonathan [name] presents for video visit  Ongoing mood at [place]  But    Counselor suggested med helps  For depression anxiety sx .  Sx related to a prev job loss and change last fall and then of course covid and reopening   [employer] in [employer].  No new med issues   bp has been good  120/80 range  Has been exercising more and eating a bit better since covid shut down  But sleep off  4-5 hours   No tobacco  ocass etoh  Not suicidal but afraid he could go that way  Hx of se of  Wellbutrin     ROS: See pertinent positives and negatives per HPI.  Past Medical History:  Diagnosis Date  . Allergic rhinitis   . Anxiety   . Depression   . History of nephrolithiasis   . Hyperlipidemia   . Hypertension   . Sebaceous cyst     Past Surgical History:  Procedure Laterality Date  . kidney stones removed     x 2  . TONSILLECTOMY     x 2  . TYMPANOSTOMY TUBE PLACEMENT      Family History  Problem Relation Age of Onset  . Diabetes Father   . Hypertension Father   . Kidney cancer Father        now on dialysis    Social History   Tobacco Use  . Smoking status: Never Smoker  . Smokeless tobacco: Never Used  Substance Use Topics  . Alcohol use: Yes    Comment: less than 1 a week  . Drug use: No      Current Outpatient Medications:  .  citalopram (CELEXA) 10 MG tablet, Take  1 tablet (10 mg total) by mouth daily., Disp: 30 tablet, Rfl: 1 .  IBUPROFEN PO, Take 400 mg by mouth as needed., Disp: , Rfl:  .  losartan-hydrochlorothiazide (HYZAAR) 100-25 MG tablet, Take 1 tablet by mouth daily., Disp: 90 tablet, Rfl: 3 .  metFORMIN (GLUCOPHAGE-XR) 500 MG 24 hr tablet, TAKE 1 TABLET (500 MG TOTAL) BY MOUTH DAILY WITH BREAKFAST. INCREASE TO 2 TIMES DAILY AFTER 2 WKS, Disp: 180 tablet, Rfl: 1 .  Omega-3 Fatty Acids (FISH OIL PO), Take by mouth., Disp: , Rfl:  .  VITAMIN E PO, Take by mouth., Disp: , Rfl:   EXAM: BP Readings from Last 3 Encounters:  10/01/18 (!) 144/92  08/15/18 (!) 162/90  11/23/17 (!) 152/90    VITALS per patient if applicable:  GENERAL: alert, oriented, appears well and in no acute distress  HEENT: atraumatic, conjunttiva clear, no obvious abnormalities on inspection of external nose and ears  NECK: normal movements of the head and neck  LUNGS: on inspection no signs of respiratory distress, breathing rate appears normal, no obvious gross SOB, gasping or wheezing  CV: no obvious cyanosis  MS: moves all visible extremities without noticeable abnormality  PSYCH/NEURO: pleasant and cooperative, no obvious depression or anxiety, speech and thought processing grossly intact Lab Results  Component Value Date   WBC 9.4 09/05/2018   HGB 16.6 09/05/2018   HCT 48.3 09/05/2018   PLT 238.0 09/05/2018   GLUCOSE 127 (H) 01/04/2019   CHOL 222 (H) 01/04/2019   TRIG 157.0 (H) 01/04/2019   HDL 42.50 01/04/2019   LDLDIRECT 190.2 12/11/2013   LDLCALC 148 (H) 01/04/2019   ALT 35 09/05/2018   AST 22 09/05/2018   NA 137 01/04/2019   K 3.8 01/04/2019   CL 99 01/04/2019   CREATININE 1.25 01/04/2019   BUN 18 01/04/2019   CO2 28 01/04/2019   TSH 0.78 05/24/2017   PSA 0.84 01/04/2019   HGBA1C 6.6 (H) 01/04/2019   MICROALBUR 0.9 01/04/2019  anxiety 12  No panic depression 18  Not suicidal   ASSESSMENT AND PLAN:  Discussed the following assessment  and plan:    ICD-10-CM   1. Adjustment reaction with anxiety and depression  F43.23    suspect reactive  but fam hx child reactivve  add citalopram 10 and rovv in 3-4 weeks   2. Diabetes mellitus without complication ([place])  [id number]   3. Medication management  Z79.899      Sleep anxiety  Depression    Not well on wellbutrin in past    And id have seom adhd rx in remote past  Stay in counseling  Add citalopram 10 per day  rov vv in 3-4 weeks or as needed may nedd to inc dose at that time  Disc se potential.  Continue lifestyle intervention healthy eating and exercise  And counseling . And will do some fu  His dm in future  Counseled.   Expectant management and discussion of plan and treatment with opportunity to ask questions and all were answered. The patient agreed with the plan and demonstrated an understanding of the instructions.   Advised to call back or seek an in-person evaluation if worsening  or having  further concerns . In interim  Can use my chart  Total visit 27mins > 50% spent counseling and coordinating care as indicated in above note and in instructions to patient .       Shanon Ace, MD

## 2019-08-14 ENCOUNTER — Ambulatory Visit (INDEPENDENT_AMBULATORY_CARE_PROVIDER_SITE_OTHER): Payer: BC Managed Care – PPO | Admitting: Psychology

## 2019-08-14 DIAGNOSIS — F4323 Adjustment disorder with mixed anxiety and depressed mood: Secondary | ICD-10-CM | POA: Diagnosis not present

## 2019-08-24 ENCOUNTER — Other Ambulatory Visit: Payer: Self-pay | Admitting: Internal Medicine

## 2019-08-26 NOTE — Telephone Encounter (Signed)
Please arrange for virtual visit or other for  Med evaluation   Sending in refill in interim

## 2019-08-26 NOTE — Telephone Encounter (Signed)
Last ov:08/02/2019 Last filled:08/02/2019

## 2019-08-26 NOTE — Telephone Encounter (Signed)
lvm for pt to call back to set up appt 

## 2019-08-28 ENCOUNTER — Ambulatory Visit (INDEPENDENT_AMBULATORY_CARE_PROVIDER_SITE_OTHER): Payer: BC Managed Care – PPO | Admitting: Psychology

## 2019-08-28 DIAGNOSIS — F4323 Adjustment disorder with mixed anxiety and depressed mood: Secondary | ICD-10-CM

## 2019-09-11 ENCOUNTER — Other Ambulatory Visit: Payer: Self-pay

## 2019-09-11 ENCOUNTER — Encounter: Payer: Self-pay | Admitting: Internal Medicine

## 2019-09-11 ENCOUNTER — Ambulatory Visit (INDEPENDENT_AMBULATORY_CARE_PROVIDER_SITE_OTHER): Payer: BC Managed Care – PPO | Admitting: Internal Medicine

## 2019-09-11 ENCOUNTER — Ambulatory Visit (INDEPENDENT_AMBULATORY_CARE_PROVIDER_SITE_OTHER): Payer: BC Managed Care – PPO | Admitting: Psychology

## 2019-09-11 DIAGNOSIS — F4323 Adjustment disorder with mixed anxiety and depressed mood: Secondary | ICD-10-CM

## 2019-09-11 DIAGNOSIS — Z79899 Other long term (current) drug therapy: Secondary | ICD-10-CM | POA: Diagnosis not present

## 2019-09-11 MED ORDER — CITALOPRAM HYDROBROMIDE 10 MG PO TABS: 15.0000 mg | ORAL_TABLET | Freq: Every day | ORAL | 0 refills | Status: DC

## 2019-09-11 NOTE — Progress Notes (Signed)
Virtual Visit via Video Note  I connected with@ on 09/11/19 at  3:00 PM EDT by a video enabled telemedicine application and verified that I am speaking with the correct person using two identifiers. Location patient: home Location provider:work or home office Persons participating in the virtual visit: patient, provider  [place] national recommendations  regarding COVID 19 pandemic   video visit is advised over in office visit for this patient.  Patient aware  of the limitations of evaluation and management by telemedicine and  availability of in person appointments. and agreed to proceed.   HPI: Jonathan [name] presents for video visit  Had trouble for me to log on visual  But no audio and then had to change to phone visit .   On 10 of citalopram for over a month  Has some better  Anxiety but not that different  No se noted   Seeing counselor every 2 weeks.  Less augmentation of   Mood and some calmer .  ROS: See pertinent positives and negatives per HPI.  Past Medical History:  Diagnosis Date  . Allergic rhinitis   . Anxiety   . Depression   . History of nephrolithiasis   . Hyperlipidemia   . Hypertension   . Sebaceous cyst     Past Surgical History:  Procedure Laterality Date  . kidney stones removed     x 2  . TONSILLECTOMY     x 2  . TYMPANOSTOMY TUBE PLACEMENT      Family History  Problem Relation Age of Onset  . Diabetes Father   . Hypertension Father   . Kidney cancer Father        now on dialysis    Social History   Tobacco Use  . Smoking status: Never Smoker  . Smokeless tobacco: Never Used  Substance Use Topics  . Alcohol use: Yes    Comment: less than 1 a week  . Drug use: No      Current Outpatient Medications:  .  citalopram (CELEXA) 10 MG tablet, Take 1.5 tablets (15 mg total) by mouth daily. May increase to  20 mg, Disp: 135 tablet, Rfl: 0 .  IBUPROFEN PO, Take 400 mg by mouth as needed., Disp: , Rfl:  .  losartan-hydrochlorothiazide  (HYZAAR) 100-25 MG tablet, Take 1 tablet by mouth daily., Disp: 90 tablet, Rfl: 3 .  metFORMIN (GLUCOPHAGE-XR) 500 MG 24 hr tablet, TAKE 1 TABLET (500 MG TOTAL) BY MOUTH DAILY WITH BREAKFAST. INCREASE TO 2 TIMES DAILY AFTER 2 WKS, Disp: 180 tablet, Rfl: 1 .  Omega-3 Fatty Acids (FISH OIL PO), Take by mouth., Disp: , Rfl:  .  VITAMIN E PO, Take by mouth., Disp: , Rfl:   EXAM: BP Readings from Last 3 Encounters:  10/01/18 (!) 144/92  08/15/18 (!) 162/90  11/23/17 (!) 152/90    VITALS per patient if applicable:  GENERAL: alert, oriented, appears well and in no acute distress  Only  Initial visua  avaialbe    HEENT: atraumatic, conjunttiva clear, no obvious abnormalities on inspection of external nose and ears  NECK: normal movements of the head and neck  LUNGS: on inspection no signs of respiratory distress, breathing rate appears normal, no obvious gross SOB, gasping or wheezing  CV: no obvious cyanosis  MS: moves all visible extremities without noticeable abnormality  PSYCH/NEURO: pleasant and cooperative, no obvious depression or anxiety, speech and thought processing grossly intact Lab Results  Component Value Date   WBC 9.4 09/05/2018  HGB 16.6 09/05/2018   HCT 48.3 09/05/2018   PLT 238.0 09/05/2018   GLUCOSE 127 (H) 01/04/2019   CHOL 222 (H) 01/04/2019   TRIG 157.0 (H) 01/04/2019   HDL 42.50 01/04/2019   LDLDIRECT 190.2 12/11/2013   LDLCALC 148 (H) 01/04/2019   ALT 35 09/05/2018   AST 22 09/05/2018   NA 137 01/04/2019   K 3.8 01/04/2019   CL 99 01/04/2019   CREATININE 1.25 01/04/2019   BUN 18 01/04/2019   CO2 28 01/04/2019   TSH 0.78 05/24/2017   PSA 0.84 01/04/2019   HGBA1C 6.6 (H) 01/04/2019   MICROALBUR 0.9 01/04/2019    ASSESSMENT AND PLAN:  Discussed the following assessment and plan:    ICD-10-CM   1. Adjustment reaction with anxiety and depression  F43.23   2. Medication management  Z79.899    Inc to 15 mg citalopram per day and after 2 weeks  can consider inc to 20 mg if wishes    Plan   Fu labs as planned  To make appt   And then rov to eval  To fu bg and lipid  Issues  Counseled.   Expectant management and discussion of plan and treatment with opportunity to ask questions and all were answered. The patient agreed with the plan and demonstrated an understanding of the instructions.  Return for preventive /cpx and medications in another nmont  can do a1c at visit .  Advised to call back or seek an in-person evaluation if worsening  or having  further concerns .  I provided 15  minutes of non-face-to-face time during this encounter.   Shanon Ace, MD

## 2019-09-18 ENCOUNTER — Other Ambulatory Visit: Payer: Self-pay | Admitting: Internal Medicine

## 2019-10-03 ENCOUNTER — Ambulatory Visit (INDEPENDENT_AMBULATORY_CARE_PROVIDER_SITE_OTHER): Payer: BC Managed Care – PPO | Admitting: Psychology

## 2019-10-03 DIAGNOSIS — F330 Major depressive disorder, recurrent, mild: Secondary | ICD-10-CM | POA: Diagnosis not present

## 2019-10-16 ENCOUNTER — Ambulatory Visit (INDEPENDENT_AMBULATORY_CARE_PROVIDER_SITE_OTHER): Payer: BC Managed Care – PPO | Admitting: Psychology

## 2019-10-16 DIAGNOSIS — F4323 Adjustment disorder with mixed anxiety and depressed mood: Secondary | ICD-10-CM | POA: Diagnosis not present

## 2019-10-30 ENCOUNTER — Ambulatory Visit (INDEPENDENT_AMBULATORY_CARE_PROVIDER_SITE_OTHER): Payer: BC Managed Care – PPO | Admitting: Psychology

## 2019-10-30 DIAGNOSIS — F4323 Adjustment disorder with mixed anxiety and depressed mood: Secondary | ICD-10-CM | POA: Diagnosis not present

## 2019-11-12 ENCOUNTER — Ambulatory Visit (INDEPENDENT_AMBULATORY_CARE_PROVIDER_SITE_OTHER): Payer: BC Managed Care – PPO | Admitting: Psychology

## 2019-11-12 DIAGNOSIS — F330 Major depressive disorder, recurrent, mild: Secondary | ICD-10-CM | POA: Diagnosis not present

## 2019-11-12 DIAGNOSIS — F4323 Adjustment disorder with mixed anxiety and depressed mood: Secondary | ICD-10-CM | POA: Diagnosis not present

## 2019-11-27 ENCOUNTER — Ambulatory Visit: Payer: BC Managed Care – PPO | Admitting: Psychology

## 2019-12-12 ENCOUNTER — Ambulatory Visit (INDEPENDENT_AMBULATORY_CARE_PROVIDER_SITE_OTHER): Payer: BC Managed Care – PPO | Admitting: Psychology

## 2019-12-12 DIAGNOSIS — F900 Attention-deficit hyperactivity disorder, predominantly inattentive type: Secondary | ICD-10-CM | POA: Diagnosis not present

## 2019-12-12 DIAGNOSIS — F330 Major depressive disorder, recurrent, mild: Secondary | ICD-10-CM | POA: Diagnosis not present

## 2019-12-12 DIAGNOSIS — F4323 Adjustment disorder with mixed anxiety and depressed mood: Secondary | ICD-10-CM | POA: Diagnosis not present

## 2019-12-13 ENCOUNTER — Other Ambulatory Visit: Payer: Self-pay | Admitting: Internal Medicine

## 2019-12-26 ENCOUNTER — Ambulatory Visit (INDEPENDENT_AMBULATORY_CARE_PROVIDER_SITE_OTHER): Payer: BC Managed Care – PPO | Admitting: Psychology

## 2019-12-26 DIAGNOSIS — F900 Attention-deficit hyperactivity disorder, predominantly inattentive type: Secondary | ICD-10-CM | POA: Diagnosis not present

## 2019-12-26 DIAGNOSIS — F4323 Adjustment disorder with mixed anxiety and depressed mood: Secondary | ICD-10-CM | POA: Diagnosis not present

## 2019-12-26 DIAGNOSIS — F330 Major depressive disorder, recurrent, mild: Secondary | ICD-10-CM

## 2019-12-27 ENCOUNTER — Other Ambulatory Visit: Payer: Self-pay | Admitting: Internal Medicine

## 2020-01-05 ENCOUNTER — Other Ambulatory Visit: Payer: Self-pay

## 2020-01-05 ENCOUNTER — Emergency Department (HOSPITAL_COMMUNITY): Payer: BC Managed Care – PPO

## 2020-01-05 ENCOUNTER — Emergency Department (HOSPITAL_COMMUNITY)
Admission: EM | Admit: 2020-01-05 | Discharge: 2020-01-05 | Disposition: A | Discharge status: Home / Self Care | Payer: BC Managed Care – PPO | Attending: Emergency Medicine | Admitting: Emergency Medicine

## 2020-01-05 ENCOUNTER — Encounter (HOSPITAL_COMMUNITY): Payer: Self-pay | Admitting: Emergency Medicine

## 2020-01-05 DIAGNOSIS — R918 Other nonspecific abnormal finding of lung field: Secondary | ICD-10-CM

## 2020-01-05 DIAGNOSIS — I10 Essential (primary) hypertension: Secondary | ICD-10-CM | POA: Diagnosis not present

## 2020-01-05 DIAGNOSIS — N201 Calculus of ureter: Secondary | ICD-10-CM | POA: Diagnosis not present

## 2020-01-05 DIAGNOSIS — R1032 Left lower quadrant pain: Secondary | ICD-10-CM | POA: Diagnosis not present

## 2020-01-05 DIAGNOSIS — Z79899 Other long term (current) drug therapy: Secondary | ICD-10-CM | POA: Diagnosis not present

## 2020-01-05 DIAGNOSIS — E119 Type 2 diabetes mellitus without complications: Secondary | ICD-10-CM | POA: Diagnosis not present

## 2020-01-05 DIAGNOSIS — N132 Hydronephrosis with renal and ureteral calculous obstruction: Secondary | ICD-10-CM | POA: Diagnosis not present

## 2020-01-05 DIAGNOSIS — R112 Nausea with vomiting, unspecified: Secondary | ICD-10-CM | POA: Diagnosis not present

## 2020-01-05 DIAGNOSIS — Z7984 Long term (current) use of oral hypoglycemic drugs: Secondary | ICD-10-CM | POA: Diagnosis not present

## 2020-01-05 DIAGNOSIS — R911 Solitary pulmonary nodule: Secondary | ICD-10-CM | POA: Diagnosis not present

## 2020-01-05 HISTORY — DX: Type 2 diabetes mellitus without complications: E11.9

## 2020-01-05 LAB — URINALYSIS, ROUTINE W REFLEX MICROSCOPIC
Bilirubin Urine: NEGATIVE
Glucose, UA: NEGATIVE mg/dL
Ketones, ur: NEGATIVE mg/dL
Leukocytes,Ua: NEGATIVE
Nitrite: NEGATIVE
Protein, ur: NEGATIVE mg/dL
Specific Gravity, Urine: 1.017 (ref 1.005–1.030)
pH: 5 (ref 5.0–8.0)

## 2020-01-05 LAB — COMPREHENSIVE METABOLIC PANEL
ALT: 33 U/L (ref 0–44)
AST: 24 U/L (ref 15–41)
Albumin: 3.9 g/dL (ref 3.5–5.0)
Alkaline Phosphatase: 54 U/L (ref 38–126)
Anion gap: 12 (ref 5–15)
BUN: 14 mg/dL (ref 6–20)
CO2: 23 mmol/L (ref 22–32)
Calcium: 9.2 mg/dL (ref 8.9–10.3)
Chloride: 104 mmol/L (ref 98–111)
Creatinine, Ser: 1.44 mg/dL — ABNORMAL HIGH (ref 0.61–1.24)
GFR calc Af Amer: >60 mL/min (ref >60)
GFR calc non Af Amer: 52 mL/min — ABNORMAL LOW (ref >60)
Glucose, Bld: 181 mg/dL — ABNORMAL HIGH (ref 70–99)
Potassium: 3.8 mmol/L (ref 3.5–5.1)
Sodium: 139 mmol/L (ref 135–145)
Total Bilirubin: 0.8 mg/dL (ref 0.3–1.2)
Total Protein: 6.7 g/dL (ref 6.5–8.1)

## 2020-01-05 LAB — CBC
HCT: 52.2 % — ABNORMAL HIGH (ref 39.0–52.0)
Hemoglobin: 17.3 g/dL — ABNORMAL HIGH (ref 13.0–17.0)
MCH: 31.1 pg (ref 26.0–34.0)
MCHC: 33.1 g/dL (ref 30.0–36.0)
MCV: 93.7 fL (ref 80.0–100.0)
Platelets: 243 10*3/uL (ref 150–400)
RBC: 5.57 MIL/uL (ref 4.22–5.81)
RDW: 12.2 % (ref 11.5–15.5)
WBC: 12.4 10*3/uL — ABNORMAL HIGH (ref 4.0–10.5)
nRBC: 0 % (ref 0.0–0.2)

## 2020-01-05 LAB — LIPASE, BLOOD: Lipase: 24 U/L (ref 11–51)

## 2020-01-05 MED ORDER — MORPHINE SULFATE (PF) 2 MG/ML IV SOLN
2.0000 mg | Freq: Once | INTRAVENOUS | Status: AC
  Administered 2020-01-05: 10:00:00 2 mg via INTRAVENOUS
  Filled 2020-01-05: qty 1

## 2020-01-05 MED ORDER — SODIUM CHLORIDE 0.9 % IV BOLUS
500.0000 mL | Freq: Once | INTRAVENOUS | Status: AC
  Administered 2020-01-05: 500 mL via INTRAVENOUS

## 2020-01-05 MED ORDER — ONDANSETRON 8 MG PO TBDP: 8.0000 mg | ORAL_TABLET | Freq: Three times a day (TID) | ORAL | 0 refills | Status: DC | PRN

## 2020-01-05 MED ORDER — OXYCODONE-ACETAMINOPHEN 5-325 MG PO TABS: 2.0000 | ORAL_TABLET | ORAL | 0 refills | Status: DC | PRN

## 2020-01-05 MED ORDER — KETOROLAC TROMETHAMINE 30 MG/ML IJ SOLN
15.0000 mg | Freq: Once | INTRAMUSCULAR | Status: AC
  Administered 2020-01-05: 15 mg via INTRAVENOUS
  Filled 2020-01-05: qty 1

## 2020-01-05 MED ORDER — SODIUM CHLORIDE 0.9% FLUSH
3.0000 mL | Freq: Once | INTRAVENOUS | Status: AC
  Administered 2020-01-05: 3 mL via INTRAVENOUS

## 2020-01-05 NOTE — ED Provider Notes (Signed)
[facility] EMERGENCY DEPARTMENT Provider Note   CSN: [id number] Arrival date & time: 01/05/20  [id number]     History Chief Complaint  Patient presents with  . Abdominal Pain    Jonathan [name] is a 59 y.o. male.  HPI 59 year old male presents today with left lower quadrant pain.  He states he had some pain on Thursday and Friday.  He describes described it as left lower quadrant and crampy in nature.  He thought he may be constipated.  He states that the pain had resolved yesterday morning when he woke up.  He then increased his fiber and had normal bowel movements felt that he was better.  Today on the way to church, he began having left-sided flank pain again.  It is severe and is now at a 9 out of 10.  It made him nauseated and had a small amount of emesis.  He states that he has had a kidney stone in the past but the location and amount of this pain was somewhat different than what he had before.  He denies fever, chills, cough, dyspnea, chest pain, lightheadedness or weakness.    Past Medical History:  Diagnosis Date  . Allergic rhinitis   . Anxiety   . Depression   . Diabetes mellitus without complication ([place])   . History of nephrolithiasis   . Hyperlipidemia   . Hypertension   . Sebaceous cyst     Patient Active Problem List   Diagnosis Date Noted  . Prediabetes 05/15/2016  . Essential hypertension 05/15/2016  . Hyperlipidemia 05/15/2016  . Dyspepsia and other specified disorders of function of stomach 10/25/2013  . Change in bowel habits 09/07/2013  . Medication management 09/07/2013  . Heartburn symptom 09/07/2013  . Hyperglycemia 09/04/2013  . BMI 31.0-31.9,adult 05/07/2013  . SKIN LESION, UNCERTAIN SIGNIFICANCE 03/31/2010  . NEPHROLITHIASIS, HX OF 02/10/2008  . HYPERLIPIDEMIA 02/06/2008  . ANXIETY 02/06/2008  . DEPRESSION 02/06/2008  . ATTENTION DEFICIT DISORDER, ADULT 02/06/2008  . HYPERTENSION 02/06/2008  . ALLERGIC RHINITIS 02/06/2008      Past Surgical History:  Procedure Laterality Date  . kidney stones removed     x 2  . TONSILLECTOMY     x 2  . TYMPANOSTOMY TUBE PLACEMENT         Family History  Problem Relation Age of Onset  . Diabetes Father   . Hypertension Father   . Kidney cancer Father        now on dialysis    Social History   Tobacco Use  . Smoking status: Never Smoker  . Smokeless tobacco: Never Used  Substance Use Topics  . Alcohol use: Yes    Comment: less than 1 a week  . Drug use: No    Home Medications Prior to Admission medications   Medication Sig Start Date End Date Taking? Authorizing Provider  citalopram (CELEXA) 10 MG tablet TAKE 1.5 TABLETS BY MOUTH DAILY *MAY INCREASE TO 2 TABLETS AS DIRECTED* Patient taking differently: Take 15 mg by mouth daily.  12/27/19  Yes Panosh, Standley Brooking, MD  loratadine (CLARITIN) 10 MG tablet Take 10 mg by mouth every other day.   Yes [provider]  losartan-hydrochlorothiazide (HYZAAR) 50-12.5 MG tablet TAKE 2 TABLETS BY MOUTH EVERY DAY Patient taking differently: Take 2 tablets by mouth daily.  09/18/19  Yes Panosh, Standley Brooking, MD  metFORMIN (GLUCOPHAGE-XR) 500 MG 24 hr tablet TAKE 1 TABLET (500 MG TOTAL) BY MOUTH DAILY WITH BREAKFAST. INCREASE TO 2  TIMES DAILY AFTER 2 WKS Patient taking differently: Take 500 mg by mouth 2 (two) times daily.  09/18/19  Yes Panosh, Standley Brooking, MD  Probiotic Product (PROBIOTIC PO) Take 1 tablet by mouth every other day.   Yes [provider]    Allergies    Zocor [simvastatin] and Penicillins  Review of Systems   Review of Systems  All other systems reviewed and are negative.   Physical Exam Updated Vital Signs BP (!) 177/87 (BP Location: Left Arm)   Pulse 77   Temp 98.4 F (36.9 C) (Oral)   Resp 16   SpO2 97%   Physical Exam Vitals and nursing note reviewed.  Constitutional:      Appearance: He is well-developed.  HENT:     Head: Normocephalic.     Mouth/Throat:     Mouth: Mucous  membranes are moist.  Eyes:     Extraocular Movements: Extraocular movements intact.  Cardiovascular:     Rate and Rhythm: Normal rate and regular rhythm.  Pulmonary:     Effort: Pulmonary effort is normal.     Breath sounds: Normal breath sounds.  Abdominal:     General: Abdomen is flat. Bowel sounds are normal.     Palpations: Abdomen is soft.     Tenderness: There is abdominal tenderness in the left lower quadrant.  Skin:    General: Skin is warm and dry.     Capillary Refill: Capillary refill takes less than 2 seconds.  Neurological:     General: No focal deficit present.     Mental Status: He is alert. He is disoriented.  Psychiatric:        Mood and Affect: Mood normal.     ED Results / Procedures / Treatments   Labs (all labs ordered are listed, but only abnormal results are displayed) Labs Reviewed  COMPREHENSIVE METABOLIC PANEL - Abnormal; Notable for the following components:      Result Value   Glucose, Bld 181 (*)    Creatinine, Ser 1.44 (*)    GFR calc non Af Amer 52 (*)    All other components within normal limits  CBC - Abnormal; Notable for the following components:   WBC 12.4 (*)    Hemoglobin 17.3 (*)    HCT 52.2 (*)    All other components within normal limits  LIPASE, BLOOD  URINALYSIS, ROUTINE W REFLEX MICROSCOPIC    EKG None  Radiology CT Renal Stone Study  Result Date: 01/05/2020 CLINICAL DATA:  Flank pain.  Kidney stone suspected. EXAM: CT ABDOMEN AND PELVIS WITHOUT CONTRAST TECHNIQUE: Multidetector CT imaging of the abdomen and pelvis was performed following the standard protocol without IV contrast. COMPARISON:  None. FINDINGS: Lower chest: 4 pulmonary nodules are seen in the lung bases. The largest nodule on the left is seen on axial image 23 and coronal image 67 measuring 10 x 9 by 6 mm measuring 8 mm in mean dimension. Smaller nodules are seen on the left on axial images 30 and 19. A 5 mm nodule seen in the right base on axial image 8 as  well. Scattered atelectasis is seen in the bases. Lower chest otherwise normal. Hepatobiliary: Hepatic steatosis.  The gallbladder is unremarkable. Pancreas: Unremarkable. No pancreatic ductal dilatation or surrounding inflammatory changes. Spleen: Normal in size without focal abnormality. Adrenals/Urinary Tract: Adrenal glands are normal. No right-sided renal stones are identified. There is a 3 mm stone in the left kidney on coronal image 61. Mild left hydronephrosis and perinephric  stranding is identified. There is a 6 mm stone in the mid left ureter on coronal image 57. The left ureter is mildly dilated proximal to this stone. There is also periureteral stranding. No other left ureteral stones are noted. No right-sided hydronephrosis, perinephric stranding, ureterectasis, or ureteral stones. An exophytic cyst is seen off the left kidney. The bladder is unremarkable. Stomach/Bowel: The stomach and small bowel are normal. Colonic diverticulosis is seen primarily in the sigmoid and distal descending colon. The remainder of the colon is normal in appearance. The appendix is normal. Vascular/Lymphatic: Mild atherosclerotic changes are seen in the nonaneurysmal aorta. Reproductive: Prostate is unremarkable. Other: No free air free fluid. A fat containing umbilical hernia is noted. Musculoskeletal: No acute or significant osseous findings. IMPRESSION: 1. There is a 6 mm stone in the mid left ureter resulting in mild hydronephrosis, perinephric stranding, mild proximal ureterectasis, and periureteral stranding. 2. There is a 3 mm stone in the left kidney. No right-sided renal stones. 3. 4 pulmonary nodules are identified. The largest is seen in the left lung with a mean diameter of 8 mm. Non-contrast chest CT at 6-12 months is recommended. If the nodule is stable at time of repeat CT, then future CT at 18-24 months (from today's scan) is considered optional for low-risk patients, but is recommended for high-risk  patients. This recommendation follows the consensus statement: Guidelines for Management of Incidental Pulmonary Nodules Detected on CT Images: From the Fleischner Society 2017; Radiology 2017; 284:228-243. 4. Hepatic steatosis. 5. Colonic diverticulosis without diverticulitis. 6. Mild atherosclerotic changes in the nonaneurysmal aorta. 7. Small fat containing umbilical hernia. Electronically Signed   By: Dorise Bullion III M.D   On: 01/05/2020 10:51    Procedures Procedures (including critical care time)  Medications Ordered in ED Medications  ketorolac (TORADOL) 30 MG/ML injection 15 mg (has no administration in time range)  sodium chloride 0.9 % bolus 500 mL (has no administration in time range)  sodium chloride flush (NS) 0.9 % injection 3 mL (3 mLs Intravenous Given 01/05/20 1021)  sodium chloride 0.9 % bolus 500 mL (500 mLs Intravenous New Bag/Given 01/05/20 1021)  morphine 2 MG/ML injection 2 mg (2 mg Intravenous Given 01/05/20 1019)    ED Course  I have reviewed the triage vital signs and the nursing notes.  Pertinent labs & imaging results that were available during my care of the patient were reviewed by me and considered in my medical decision making (see chart for details).    MDM Rules/Calculators/A&P                      11:47 AM Patient improved but still has some pain.  MS given first , toradol just given. Awaiting ua Discussed ct results: 1- left ureteral stone-plan pain meds and refer to urology.  Discussed need for close follow up given obstruction.  Return precautions, worsening pain, fever 2- pulmonary nodules-d.w.patient need to see pmd for referral for f/u Final Clinical Impression(s) / ED Diagnoses Final diagnoses:  Left ureteral stone  Pulmonary nodules    Rx / DC Orders ED Discharge Orders    None       Pattricia Boss, MD 01/05/20 [phone]

## 2020-01-05 NOTE — ED Triage Notes (Signed)
C/o sharp LLQ pain since this morning with nausea and vomiting.  Denies urinary complaint.  States he had pain on Thursday and Friday that radiated from LLQ to L flank.

## 2020-01-05 NOTE — Discharge Instructions (Signed)
Call urologist for follow up this week Take pain meds as needed Return if pain too severe, fever, lightheaded or weak

## 2020-01-06 ENCOUNTER — Ambulatory Visit (INDEPENDENT_AMBULATORY_CARE_PROVIDER_SITE_OTHER): Payer: BC Managed Care – PPO | Admitting: Psychiatry

## 2020-01-06 ENCOUNTER — Encounter: Payer: Self-pay | Admitting: Psychiatry

## 2020-01-06 VITALS — BP 156/83 | Ht 69.0 in | Wt 200.0 lb

## 2020-01-06 DIAGNOSIS — F330 Major depressive disorder, recurrent, mild: Secondary | ICD-10-CM | POA: Diagnosis not present

## 2020-01-06 DIAGNOSIS — F902 Attention-deficit hyperactivity disorder, combined type: Secondary | ICD-10-CM

## 2020-01-06 NOTE — Patient Instructions (Signed)
Decrease Citalopram to 1/2 of a 10 mg tablet daily for one week, then stop.   Start Trintellix 5 mg daily with food for one week (while also taking 1/2 tab of Citalopram), then increase to 10 mg daily.

## 2020-01-06 NOTE — Progress Notes (Signed)
[facility] MD/PA/NP Initial Note  01/06/2020 4:35 PM Jonathan [name]  MRN:  [id number]  Chief Complaint:  Chief Complaint    Depression; ADHD; Medication Problem      HPI: Patient is a 59 year old male being seen for initial evaluation for depression and possible attention deficit disorder.  He is referred by his therapist, Bambi Cottle, LCSW. He has been having difficulty with "focus and attention my entire life." He reports that his wife and daughter have frequently commented on this. He reports that he often has difficulty decided where to start with decision making and prioritizing. He reports that he has difficulty sitting and reading. He reports that he misses reading and getting absorbed in what he is reading. He reports that he had difficulty in college and seminary. He reports that he starts multiple tasks without completing them. He reports that he always wears black pants and black socks to avoid having to make multiple decisions. He reports distractibility. He reports that he has to be aware that he does not interrupt others and has to remind himself that he does not like to be interrupted. He reports having to bite his tongue.  He reports difficulty sitting still and has a standing desk to help with this. He tends to fidget and will carry things to direct his fidgeting. He frequently takes notes and doodles during meetings. He reports that he has to get out and move around and needs a change periodically.  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 Reports that he at times will hyper-focus on certain aspects of his work, particularly when his work is more intense or challenging. He reports that he will procrastinate on certain tasks and sometimes think a task is going to take longer than expected.   He reports that he used to misplace and lose things in the past. Denies difficulty keeping up with appointments and uses programs and  reminders to help compensate. He also uses calendars and reminders for time management.   He reports that when he was young he was always distracted. He recalls being sent to the principal's office because he got out of his desk to look out the window and watch it snow. He reports that he was frequently fidgety.   He reports that he has periods when he is "grumpy." He reports that he may currently have some low level depression. He reports increased difficulty getting up in the morning since starting Citalopram. He reports that his energy has been low. Motivation has been ok recently and was lower in late 2020. He reports that his sleep has been "very fitful" and not as restful for the last year. He reports having periods of middle of the night awakening. Denies difficulty falling asleep. Averaging about 6-7 hours a night. Appetite has been "up and down in 2020." Denies SI.   He noticed depression when his wife had postpartum depression after the birth of their daughter 25 years ago. He reports that he has had some episodes of mild depression. Denies any periods of decreased need for sleep, increased energy, elevated mood, or risky or impulsive behavior. He reports making impulsive small purchases at the grocery store.   He reports occasional anxiety when he feels overwhelmed by different responsibilities and decisions. Will occasionally feel that BP is elevated. He reports that he may have had 1-2 panic attacks in his lifetime. Denies social anxiety. Denies excessive worry. He reports thinking about "all of  the what if's."   Born in [place] and raised in [place]. He has a younger brother. Reports that parents were supportive. He recalls feeling that his parents were not ready to be parents when he was born. Father was a Chief Technology Officer and mother taught preschool and elementary school. Father died 6 years ago. 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 He reports  that for the last 15 years his role was more administrative and has done [employer]. Wife, Shauna Hugh, is supportive and they have been married about 27 years. They are considering couples counseling. He reports that their marriage is the best it has been in years. Daughter is 57 yo and is currently at home with he and his wife during the pandemic. 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 Also enjoys writing. Enjoys disc golf.    Past Psychiatric Medication Trials: Citalopram- Sexual side effects, fatigue. Partially effective for depression. Increased to 15 mg in the last month and noticed more fatigue. He reports that he has been taking 10 mg most of the time since then.  Wellbutrin- Took 25 years ago.   Visit Diagnosis:    ICD-10-CM   1. Attention deficit hyperactivity disorder (ADHD), combined type  F90.2   2. Mild episode of recurrent major depressive disorder (HCC)  F33.0     Past Psychiatric History: Has been seeing Bambi Cottle, LCSW. Saw a psychiatrist 25 years ago. Denies psychiatric hospitalization.  Past Medical History:  Past Medical History:  Diagnosis Date  . Allergic rhinitis   . Anxiety   . Depression   . Diabetes mellitus without complication ([place])   . History of nephrolithiasis   . Hyperlipidemia   . Hypertension   . Sebaceous cyst     Past Surgical History:  Procedure Laterality Date  . kidney stones removed     x 2  . TONSILLECTOMY     x 2  . TYMPANOSTOMY TUBE PLACEMENT       Family History:  Family History  Problem Relation Age of Onset  . Diabetes Father   . Hypertension Father   . Kidney cancer Father        now on dialysis  . Kidney Stones Father   . Kidney Stones Brother   . Huntington's disease Maternal Grandfather   . Depression Daughter   . Anxiety disorder Daughter   . ADD / ADHD Daughter     Social History:  Social History   Socioeconomic History  . Marital status: Married    Spouse name: Not on file  . Number of children:  1  . Years of education: Not on file  . Highest education level: Not on file  Occupational History  . Occupation: MINISTER    Employer: [employer]  Tobacco Use  . Smoking status: Never Smoker  . Smokeless tobacco: Never Used  Substance and Sexual Activity  . Alcohol use: Yes    Comment: less than 1 a week  . Drug use: No  . Sexual activity: Yes  Other Topics Concern  . Not on file  Social History Narrative   Married   Regular exercise- no   HH of 3   Dog car and lizard   Sleep wakening at times   New job [employer]   Per week reg sleep.    Social Determinants of Health   Financial Resource Strain:   . Difficulty of Paying Living Expenses: Not on  file  Food Insecurity:   . Worried About Charity fundraiser in the Last Year: Not on file  . Ran Out of Food in the Last Year: Not on file  Transportation Needs:   . Lack of Transportation (Medical): Not on file  . Lack of Transportation (Non-Medical): Not on file  Physical Activity:   . Days of Exercise per Week: Not on file  . Minutes of Exercise per Session: Not on file  Stress:   . Feeling of Stress : Not on file  Social Connections:   . Frequency of Communication with Friends and Family: Not on file  . Frequency of Social Gatherings with Friends and Family: Not on file  . Attends Religious Services: Not on file  . Active Member of Clubs or Organizations: Not on file  . Attends Archivist Meetings: Not on file  . Marital Status: Not on file    Allergies:  Allergies  Allergen Reactions  . Zocor [Simvastatin]     ? myalgias  . Penicillins Hives    Per pt - happened in childhood - hives possible reaction    Metabolic Disorder Labs: Lab Results  Component Value Date   HGBA1C 6.6 (H) 01/04/2019   No results found for: PROLACTIN Lab Results  Component Value Date   CHOL 222 (H) 01/04/2019   TRIG 157.0 (H) 01/04/2019   HDL 42.50 01/04/2019   CHOLHDL 5 01/04/2019   VLDL 31.4 01/04/2019    LDLCALC 148 (H) 01/04/2019   LDLCALC 156 (H) 09/05/2018   Lab Results  Component Value Date   TSH 0.78 05/24/2017   TSH 1.29 05/10/2016    Therapeutic Level Labs: No results found for: LITHIUM No results found for: VALPROATE No components found for:  CBMZ  Current Medications: Current Outpatient Medications  Medication Sig Dispense Refill  . citalopram (CELEXA) 10 MG tablet TAKE 1.5 TABLETS BY MOUTH DAILY *MAY INCREASE TO 2 TABLETS AS DIRECTED* (Patient taking differently: Take 15 mg by mouth daily. ) 180 tablet 1  . loratadine (CLARITIN) 10 MG tablet Take 10 mg by mouth every other day.    . losartan-hydrochlorothiazide (HYZAAR) 50-12.5 MG tablet TAKE 2 TABLETS BY MOUTH EVERY DAY (Patient taking differently: Take 2 tablets by mouth daily. ) 180 tablet 3  . metFORMIN (GLUCOPHAGE-XR) 500 MG 24 hr tablet TAKE 1 TABLET (500 MG TOTAL) BY MOUTH DAILY WITH BREAKFAST. INCREASE TO 2 TIMES DAILY AFTER 2 WKS (Patient taking differently: Take 500 mg by mouth 2 (two) times daily. ) 180 tablet 1  . Probiotic Product (PROBIOTIC PO) Take 1 tablet by mouth every other day.    . ondansetron (ZOFRAN ODT) 8 MG disintegrating tablet Take 1 tablet (8 mg total) by mouth every 8 (eight) hours as needed for nausea or vomiting. 20 tablet 0  . oxyCODONE-acetaminophen (PERCOCET/ROXICET) 5-325 MG tablet Take 2 tablets by mouth every 4 (four) hours as needed for severe pain. 6 tablet 0   No current facility-administered medications for this visit.    Medication Side Effects: fatigue/weakness and sexual dysfunction  Orders placed this visit:  No orders of the defined types were placed in this encounter.   Psychiatric Specialty Exam:  Review of Systems  Constitutional: Positive for fatigue.  HENT: Negative.   Eyes: Negative.   Respiratory: Negative.   Cardiovascular: Negative.   Gastrointestinal: Negative.   Endocrine: Negative.   Genitourinary:       Recent kidney stone  Musculoskeletal: Negative.    Allergic/Immunologic: Negative.  Neurological: Negative.   Hematological: Negative.   Psychiatric/Behavioral:       Please refer to HPI    Blood pressure (!) 156/83, height 5\' 9"  (1.753 m), weight 200 lb (90.7 kg).Body mass index is 29.53 kg/m.  General Appearance: Casual and Well Groomed  Eye Contact:  Good  Speech:  Clear and Coherent, Normal Rate and Talkative  Volume:  Normal  Mood:  Depressed  Affect:  Appropriate, Congruent and Full Range  Thought Process:  Coherent, Linear and Descriptions of Associations: Circumstantial  Orientation:  Full (Time, Place, and Person)  Thought Content: Logical and Hallucinations: None   Suicidal Thoughts:  No  Homicidal Thoughts:  No  Memory:  WNL  Judgement:  Good  Insight:  Good  Psychomotor Activity:  Restlessness and Frequently shifting and repositioning in chair  Concentration:  Concentration: Fair and Attention Span: Fair  Recall:  Good  Fund of Knowledge: Good  Language: Good  Assets:  Communication Skills Desire for Improvement Resilience Social Support Vocational/Educational  ADL's:  Intact  Cognition: WNL  Prognosis:  Good   Screenings:  PHQ2-9     Telemedicine from 08/02/2019 in [place] at [facility] from 10/01/2018 in [place] at [facility] from 05/24/2017 in [place] at [facility] Total Score  4  0  0  PHQ-9 Total Score  15  --  --      Receiving Psychotherapy: Yes   Treatment Plan/Recommendations: Patient seen for 70 minutes and time spent counseling patient several possible treatment options and approaches, to include starting with treatment of attention deficit disorder or addressing side effects that he is having with citalopram and also minimal improvement in depressive signs and symptoms.  Patient reports that he would prefer to first address side effects of citalopram.  Discussed potential benefits, risks, and side effects of Trintellix and patient  agrees to trial of Trintellix.  Will start Trintellix 5 mg daily with meal for 1 week, then increase to 10 mg daily for depression.  Patient to decrease citalopram to 5 mg daily for 1 week, then stop.  Recommend continuing psychotherapy with Bambi Cottle, LCSW.  Patient to follow-up with this provider in 4 to 5 weeks or sooner if clinically indicated. Patient advised to contact office with any questions, adverse effects, or acute worsening in signs and symptoms.     Thayer Headings, PMHNP

## 2020-01-08 ENCOUNTER — Other Ambulatory Visit: Payer: Self-pay

## 2020-01-08 ENCOUNTER — Encounter (HOSPITAL_BASED_OUTPATIENT_CLINIC_OR_DEPARTMENT_OTHER): Payer: Self-pay | Admitting: Urology

## 2020-01-08 ENCOUNTER — Other Ambulatory Visit: Payer: Self-pay | Admitting: Urology

## 2020-01-08 DIAGNOSIS — R1084 Generalized abdominal pain: Secondary | ICD-10-CM | POA: Diagnosis not present

## 2020-01-08 DIAGNOSIS — N202 Calculus of kidney with calculus of ureter: Secondary | ICD-10-CM | POA: Diagnosis not present

## 2020-01-08 NOTE — Progress Notes (Signed)
Spoke w/ via phone for pre-op interview---[facility] needs dos----  I stat 8, ekg            Lab results------ COVID test ------01-13-2020 due to work schedule Arrive at -------1215 01-15-2020 NPO after ------midnight food, clear liquids midnight until 815 am then npo Medications to take morning of surgery -----citalopram Diabetic medication -----none day of surgery Patient Special Instructions ----- Pre-Op special Istructions ----- Patient verbalized understanding of instructions that were given at this phone interview. Patient denies shortness of breath, chest pain, fever, cough a this phone interview.

## 2020-01-09 ENCOUNTER — Ambulatory Visit (INDEPENDENT_AMBULATORY_CARE_PROVIDER_SITE_OTHER): Payer: BC Managed Care – PPO | Admitting: Psychology

## 2020-01-09 ENCOUNTER — Telehealth: Payer: Self-pay | Admitting: Psychiatry

## 2020-01-09 DIAGNOSIS — F900 Attention-deficit hyperactivity disorder, predominantly inattentive type: Secondary | ICD-10-CM

## 2020-01-09 DIAGNOSIS — F330 Major depressive disorder, recurrent, mild: Secondary | ICD-10-CM

## 2020-01-09 MED ORDER — VORTIOXETINE HBR 10 MG PO TABS: 10.0000 mg | ORAL_TABLET | Freq: Every day | ORAL | 0 refills | Status: DC

## 2020-01-09 NOTE — Telephone Encounter (Signed)
Pt has Trintellix copay card. He is trying to find out what the medicine might cost him but needs an rx to find out. Any way to help him figure that out?

## 2020-01-13 ENCOUNTER — Other Ambulatory Visit (HOSPITAL_COMMUNITY)
Admission: RE | Admit: 2020-01-13 | Discharge: 2020-01-13 | Disposition: A | Discharge status: Home / Self Care | Payer: BC Managed Care – PPO | Source: Ambulatory Visit | Attending: Urology | Admitting: Urology

## 2020-01-13 DIAGNOSIS — Z01812 Encounter for preprocedural laboratory examination: Secondary | ICD-10-CM | POA: Insufficient documentation

## 2020-01-13 DIAGNOSIS — Z20822 Contact with and (suspected) exposure to covid-19: Secondary | ICD-10-CM | POA: Diagnosis not present

## 2020-01-13 LAB — SARS CORONAVIRUS 2 (TAT 6-24 HRS): SARS Coronavirus 2: NEGATIVE

## 2020-01-15 ENCOUNTER — Encounter (HOSPITAL_BASED_OUTPATIENT_CLINIC_OR_DEPARTMENT_OTHER): Payer: Self-pay | Admitting: Urology

## 2020-01-15 ENCOUNTER — Ambulatory Visit (HOSPITAL_BASED_OUTPATIENT_CLINIC_OR_DEPARTMENT_OTHER): Payer: BC Managed Care – PPO | Admitting: Anesthesiology

## 2020-01-15 ENCOUNTER — Ambulatory Visit (HOSPITAL_BASED_OUTPATIENT_CLINIC_OR_DEPARTMENT_OTHER)
Admission: RE | Admit: 2020-01-15 | Discharge: 2020-01-15 | Disposition: A | Discharge status: Home / Self Care | Payer: BC Managed Care – PPO | Attending: Urology | Admitting: Urology

## 2020-01-15 ENCOUNTER — Encounter (HOSPITAL_BASED_OUTPATIENT_CLINIC_OR_DEPARTMENT_OTHER)
Admission: RE | Disposition: A | Discharge status: Home / Self Care | Payer: Self-pay | Source: Home / Self Care | Attending: Urology

## 2020-01-15 ENCOUNTER — Other Ambulatory Visit: Payer: Self-pay

## 2020-01-15 DIAGNOSIS — Z6832 Body mass index (BMI) 32.0-32.9, adult: Secondary | ICD-10-CM | POA: Insufficient documentation

## 2020-01-15 DIAGNOSIS — Z888 Allergy status to other drugs, medicaments and biological substances status: Secondary | ICD-10-CM | POA: Diagnosis not present

## 2020-01-15 DIAGNOSIS — R011 Cardiac murmur, unspecified: Secondary | ICD-10-CM | POA: Insufficient documentation

## 2020-01-15 DIAGNOSIS — Z880 Allergy status to penicillin: Secondary | ICD-10-CM | POA: Insufficient documentation

## 2020-01-15 DIAGNOSIS — E785 Hyperlipidemia, unspecified: Secondary | ICD-10-CM | POA: Insufficient documentation

## 2020-01-15 DIAGNOSIS — F329 Major depressive disorder, single episode, unspecified: Secondary | ICD-10-CM | POA: Insufficient documentation

## 2020-01-15 DIAGNOSIS — Z8051 Family history of malignant neoplasm of kidney: Secondary | ICD-10-CM | POA: Insufficient documentation

## 2020-01-15 DIAGNOSIS — Z7984 Long term (current) use of oral hypoglycemic drugs: Secondary | ICD-10-CM | POA: Diagnosis not present

## 2020-01-15 DIAGNOSIS — E7800 Pure hypercholesterolemia, unspecified: Secondary | ICD-10-CM | POA: Diagnosis not present

## 2020-01-15 DIAGNOSIS — E119 Type 2 diabetes mellitus without complications: Secondary | ICD-10-CM | POA: Diagnosis not present

## 2020-01-15 DIAGNOSIS — N201 Calculus of ureter: Secondary | ICD-10-CM | POA: Diagnosis not present

## 2020-01-15 DIAGNOSIS — F419 Anxiety disorder, unspecified: Secondary | ICD-10-CM | POA: Diagnosis not present

## 2020-01-15 DIAGNOSIS — Z79899 Other long term (current) drug therapy: Secondary | ICD-10-CM | POA: Diagnosis not present

## 2020-01-15 DIAGNOSIS — E669 Obesity, unspecified: Secondary | ICD-10-CM | POA: Diagnosis not present

## 2020-01-15 DIAGNOSIS — N281 Cyst of kidney, acquired: Secondary | ICD-10-CM | POA: Insufficient documentation

## 2020-01-15 DIAGNOSIS — N202 Calculus of kidney with calculus of ureter: Secondary | ICD-10-CM | POA: Insufficient documentation

## 2020-01-15 DIAGNOSIS — I10 Essential (primary) hypertension: Secondary | ICD-10-CM | POA: Insufficient documentation

## 2020-01-15 HISTORY — PX: CYSTOSCOPY/URETEROSCOPY/HOLMIUM LASER/STENT PLACEMENT: SHX6546

## 2020-01-15 HISTORY — DX: Personal history of urinary calculi: Z87.442

## 2020-01-15 LAB — POCT I-STAT, CHEM 8
BUN: 16 mg/dL (ref 6–20)
Calcium, Ion: 1.2 mmol/L (ref 1.15–1.40)
Chloride: 103 mmol/L (ref 98–111)
Creatinine, Ser: 1.1 mg/dL (ref 0.61–1.24)
Glucose, Bld: 139 mg/dL — ABNORMAL HIGH (ref 70–99)
HCT: 54 % — ABNORMAL HIGH (ref 39.0–52.0)
Hemoglobin: 18.4 g/dL — ABNORMAL HIGH (ref 13.0–17.0)
Potassium: 3.2 mmol/L — ABNORMAL LOW (ref 3.5–5.1)
Sodium: 141 mmol/L (ref 135–145)
TCO2: 25 mmol/L (ref 22–32)

## 2020-01-15 LAB — GLUCOSE, CAPILLARY: Glucose-Capillary: 113 mg/dL — ABNORMAL HIGH (ref 70–99)

## 2020-01-15 SURGERY — CYSTOSCOPY/URETEROSCOPY/HOLMIUM LASER/STENT PLACEMENT
Anesthesia: General | Site: Ureter | Laterality: Left

## 2020-01-15 MED ORDER — CIPROFLOXACIN IN D5W 400 MG/200ML IV SOLN
400.0000 mg | Freq: Once | INTRAVENOUS | Status: AC
  Administered 2020-01-15: 400 mg via INTRAVENOUS
  Filled 2020-01-15: qty 200

## 2020-01-15 MED ORDER — OXYCODONE-ACETAMINOPHEN 5-325 MG PO TABS: 1.0000 | ORAL_TABLET | ORAL | 0 refills | Status: DC | PRN

## 2020-01-15 MED ORDER — ONDANSETRON HCL 4 MG/2ML IJ SOLN
INTRAMUSCULAR | Status: DC | PRN
  Administered 2020-01-15: 4 mg via INTRAVENOUS

## 2020-01-15 MED ORDER — MIDAZOLAM HCL 5 MG/5ML IJ SOLN
INTRAMUSCULAR | Status: DC | PRN
  Administered 2020-01-15: 2 mg via INTRAVENOUS

## 2020-01-15 MED ORDER — PROPOFOL 10 MG/ML IV BOLUS
INTRAVENOUS | Status: DC | PRN
  Administered 2020-01-15: 200 mg via INTRAVENOUS

## 2020-01-15 MED ORDER — SODIUM CHLORIDE 0.9 % IR SOLN
Status: DC | PRN
  Administered 2020-01-15: 3000 mL

## 2020-01-15 MED ORDER — ACETAMINOPHEN 500 MG PO TABS
1000.0000 mg | ORAL_TABLET | Freq: Once | ORAL | Status: AC
  Administered 2020-01-15: 12:00:00 1000 mg via ORAL
  Filled 2020-01-15: qty 2

## 2020-01-15 MED ORDER — LIDOCAINE 2% (20 MG/ML) 5 ML SYRINGE
INTRAMUSCULAR | Status: DC | PRN
  Administered 2020-01-15: 100 mg via INTRAVENOUS

## 2020-01-15 MED ORDER — FENTANYL CITRATE (PF) 100 MCG/2ML IJ SOLN
25.0000 ug | INTRAMUSCULAR | Status: DC | PRN
  Filled 2020-01-15: qty 1

## 2020-01-15 MED ORDER — PROPOFOL 10 MG/ML IV BOLUS
INTRAVENOUS | Status: AC
  Filled 2020-01-15: qty 20

## 2020-01-15 MED ORDER — LACTATED RINGERS IV SOLN
INTRAVENOUS | Status: DC
  Filled 2020-01-15: qty 1000

## 2020-01-15 MED ORDER — MIDAZOLAM HCL 2 MG/2ML IJ SOLN
INTRAMUSCULAR | Status: AC
  Filled 2020-01-15: qty 2

## 2020-01-15 MED ORDER — IOHEXOL 300 MG/ML  SOLN
INTRAMUSCULAR | Status: DC | PRN
  Administered 2020-01-15: 10 mL

## 2020-01-15 MED ORDER — PHENAZOPYRIDINE HCL 200 MG PO TABS: 200.0000 mg | ORAL_TABLET | Freq: Three times a day (TID) | ORAL | 0 refills | Status: DC | PRN

## 2020-01-15 MED ORDER — FENTANYL CITRATE (PF) 100 MCG/2ML IJ SOLN
INTRAMUSCULAR | Status: AC
  Filled 2020-01-15: qty 2

## 2020-01-15 MED ORDER — CIPROFLOXACIN IN D5W 400 MG/200ML IV SOLN
INTRAVENOUS | Status: AC
  Filled 2020-01-15: qty 200

## 2020-01-15 MED ORDER — TRAMADOL HCL 50 MG PO TABS
50.0000 mg | ORAL_TABLET | Freq: Once | ORAL | Status: AC
  Administered 2020-01-15: 50 mg via ORAL
  Filled 2020-01-15: qty 1

## 2020-01-15 MED ORDER — TRAMADOL HCL 50 MG PO TABS
ORAL_TABLET | ORAL | Status: AC
  Filled 2020-01-15: qty 1

## 2020-01-15 MED ORDER — LIDOCAINE 2% (20 MG/ML) 5 ML SYRINGE
INTRAMUSCULAR | Status: AC
  Filled 2020-01-15: qty 5

## 2020-01-15 MED ORDER — DEXAMETHASONE SODIUM PHOSPHATE 10 MG/ML IJ SOLN
INTRAMUSCULAR | Status: DC | PRN
  Administered 2020-01-15: 10 mg via INTRAVENOUS

## 2020-01-15 MED ORDER — ONDANSETRON HCL 4 MG/2ML IJ SOLN
INTRAMUSCULAR | Status: AC
  Filled 2020-01-15: qty 2

## 2020-01-15 MED ORDER — OXYBUTYNIN CHLORIDE 5 MG PO TABS: 5.0000 mg | ORAL_TABLET | Freq: Three times a day (TID) | ORAL | 1 refills | Status: DC | PRN

## 2020-01-15 MED ORDER — ACETAMINOPHEN 500 MG PO TABS
ORAL_TABLET | ORAL | Status: AC
  Filled 2020-01-15: qty 2

## 2020-01-15 MED ORDER — CIPROFLOXACIN HCL 500 MG PO TABS: 500.0000 mg | ORAL_TABLET | Freq: Two times a day (BID) | ORAL | 0 refills | Status: AC

## 2020-01-15 MED ORDER — SULFAMETHOXAZOLE-TRIMETHOPRIM 800-160 MG PO TABS: 1.0000 | ORAL_TABLET | Freq: Two times a day (BID) | ORAL | 0 refills | Status: AC

## 2020-01-15 MED ORDER — FENTANYL CITRATE (PF) 100 MCG/2ML IJ SOLN
INTRAMUSCULAR | Status: DC | PRN
  Administered 2020-01-15: 25 ug via INTRAVENOUS
  Administered 2020-01-15: 50 ug via INTRAVENOUS
  Administered 2020-01-15: 25 ug via INTRAVENOUS

## 2020-01-15 MED ORDER — DEXAMETHASONE SODIUM PHOSPHATE 10 MG/ML IJ SOLN
INTRAMUSCULAR | Status: AC
  Filled 2020-01-15: qty 1

## 2020-01-15 SURGICAL SUPPLY — 31 items
APL SKNCLS STERI-STRIP NONHPOA (GAUZE/BANDAGES/DRESSINGS) ×1
BAG DRAIN URO-CYSTO SKYTR STRL (DRAIN) ×2 IMPLANT
BAG DRN UROCATH (DRAIN) ×1
BASKET STONE 1.7 NGAGE (UROLOGICAL SUPPLIES) IMPLANT
BASKET ZERO TIP NITINOL 2.4FR (BASKET) ×2 IMPLANT
BENZOIN TINCTURE PRP APPL 2/3 (GAUZE/BANDAGES/DRESSINGS) ×2 IMPLANT
BSKT STON RTRVL ZERO TP 2.4FR (BASKET) ×1
CATH URET 5FR 28IN OPEN ENDED (CATHETERS) IMPLANT
CLOTH BEACON ORANGE TIMEOUT ST (SAFETY) ×2 IMPLANT
FIBER LASER FLEXIVA 365 (UROLOGICAL SUPPLIES) IMPLANT
FIBER LASER TRAC TIP (UROLOGICAL SUPPLIES) ×2 IMPLANT
GLOVE BIO SURGEON STRL SZ 6.5 (GLOVE) ×1 IMPLANT
GLOVE BIO SURGEON STRL SZ7.5 (GLOVE) ×2 IMPLANT
GLOVE BIOGEL PI IND STRL 7.0 (GLOVE) IMPLANT
GLOVE BIOGEL PI INDICATOR 7.0 (GLOVE) ×1
GOWN STRL REUS W/TWL LRG LVL3 (GOWN DISPOSABLE) ×2 IMPLANT
GOWN STRL REUS W/TWL XL LVL3 (GOWN DISPOSABLE) ×2 IMPLANT
GUIDEWIRE STR DUAL SENSOR (WIRE) ×1 IMPLANT
GUIDEWIRE ZIPWRE .038 STRAIGHT (WIRE) ×2 IMPLANT
IV NS 1000ML (IV SOLUTION)
IV NS 1000ML BAXH (IV SOLUTION) IMPLANT
IV NS IRRIG 3000ML ARTHROMATIC (IV SOLUTION) ×2 IMPLANT
KIT TURNOVER CYSTO (KITS) ×2 IMPLANT
MANIFOLD NEPTUNE II (INSTRUMENTS) ×2 IMPLANT
NS IRRIG 500ML POUR BTL (IV SOLUTION) ×4 IMPLANT
PACK CYSTO (CUSTOM PROCEDURE TRAY) ×2 IMPLANT
STENT URET 6FRX24 CONTOUR (STENTS) ×1 IMPLANT
STRIP CLOSURE SKIN 1/2X4 (GAUZE/BANDAGES/DRESSINGS) ×1 IMPLANT
SYR 10ML LL (SYRINGE) ×2 IMPLANT
TUBE CONNECTING 12X1/4 (SUCTIONS) IMPLANT
TUBING UROLOGY SET (TUBING) ×2 IMPLANT

## 2020-01-15 NOTE — Op Note (Addendum)
Operative Note  Preoperative diagnosis:  1.  6 mm left mid ureteral stone 2.  3 mm left renal stones  Postoperative diagnosis: Same  Procedure(s): 1.  Cystoscopy with left ureteroscopy, holmium laser lithotripsy and left JJ stent placement 2.  Left retrograde pyelogram with intraoperative interpretation of fluoroscopic imaging  Surgeon: Ellison Hughs, MD  Assistants:  None  Anesthesia:  General  Complications:  None  EBL: 5 mL  Specimens: 1.  None  Drains/Catheters: 1.  Left 6 French, 24 cm JJ stent without tether  Intraoperative findings:   1. Left retrograde pyelogram revealed a filling defect within the midportion of the left ureter along with dilation of the proximal aspects of the left ureter, consistent with the obstructing stone seen on recent cross-sectional imaging.  There were no other filling defects seen within the left renal pelvis or distal aspects of the left ureter.   Indication:  Jonathan [name] is a 59 y.o. male with a 2-week history of ongoing left-sided flank pain due to a 6 mm obstructing left mid ureteral calculus seen on CT from 01/05/2020.  He has failed medical expulsive therapy and is here today for definitive stone treatment.  He has been consented for the above procedures, voices understanding and wishes to proceed.  Description of procedure:  After informed consent was obtained, the patient was brought to the operating room and general LMA anesthesia was administered. The patient was then placed in the dorsolithotomy position and prepped and draped in the usual sterile fashion. A timeout was performed. A 23 French rigid cystoscope was then inserted into the urethral meatus and advanced into the bladder under direct vision. A complete bladder survey revealed no intravesical pathology.  A 5 French ureteral catheter was then inserted into the left ureteral orifice and a retrograde pyelogram was obtained, with the findings listed above.  A  Glidewire was then used to intubate the lumen of the ureteral catheter and was advanced up to the left renal pelvis, under fluoroscopic guidance.  The catheter was then removed, leaving the wire in place.  A semirigid ureteroscope was then advanced alongside the wire and into the left ureter.  His mid ureteral stone was identified, but quickly retropulsed into the renal pelvis.  An additional Glidewire was placed through the semirigid ureteroscope, which was then exchanged for a flexible ureteroscope.  Flexible ureteroscope was then advanced over the working wire and into position within the left renal pelvis.  His 6 mm stone was identified as well as his 3 mm lower pole stones.  A 200 m holmium laser was then used to dust the stones into numerous smaller fragments.  The flexible ureteroscope was then removed under direct vision, leaving the wire in place.  There was no evidence of ureteral trauma following ureteroscopy.  A 6 French, 24 cm JJ stent was then placed over the wire and into good position within the left collecting system, confirming placement via fluoroscopy.  The patient's bladder was then drained.  He tolerated the procedure well and was transferred to the postanesthesia in stable condition.  Plan: Follow-up on 01/22/2020 for office cystoscopy and stent removal.  Will be discharged home with appropriate pain medication and a 3-day course of Bactrim.

## 2020-01-15 NOTE — Anesthesia Postprocedure Evaluation (Signed)
Anesthesia Post Note  Patient: [name]  Procedure(s) Performed: CYSTOSCOPY/URETEROSCOPY/HOLMIUM LASER/STENT PLACEMENT (Left Ureter)     Patient location during evaluation: PACU Anesthesia Type: General Level of consciousness: awake and alert and oriented Pain management: pain level controlled Vital Signs Assessment: post-procedure vital signs reviewed and stable Respiratory status: spontaneous breathing, nonlabored ventilation and respiratory function stable Cardiovascular status: blood pressure returned to baseline Postop Assessment: no apparent nausea or vomiting Anesthetic complications: no    Last Vitals:  Vitals:   01/15/20 1526 01/15/20 1615  BP:  (!) 143/94  Pulse: 73 70  Resp: 13 18  Temp:    SpO2: 97% 97%    Last Pain:  Vitals:   01/15/20 1615  TempSrc:   PainSc: [place]

## 2020-01-15 NOTE — H&P (Signed)
PRE-OP H&P  Office Visit Report     01/08/2020   --------------------------------------------------------------------------------   Jonathan [name]  [id number]  DOB: 06-11-1961, 59 year old Male   PRIMARY CARE:  Shanon Ace, MD  REFERRING:    PROVIDER:  Ellison Hughs, M.D.  LOCATION:  [facility] [phone]     --------------------------------------------------------------------------------   CC: I have pain in the flank.  HPI: Jonathan [name] is a 59 year-old male patient who is here for flank pain.  The problem is on the left side. His pain started about approximately 01/01/2020. The pain is sharp. The intensity of his pain is rated as a 9. The pain is constant. The pain does radiate.   None< makes the pain better. Coughing and twisting makes the pain worse. He has not been treated with any pain medications.   He has had this same pain previously. He has had kidney stones.   -6 mm left mid-ureteral and 3 mm left renal stones noted on CTSS from 01/05/20  -Hx of kidney stones ~25 years ago--underwent basket stone extraction --had bad experience with self-stent removal.     ALLERGIES: penicillin Statin Drugs    MEDICATIONS: Citalopram Hbr 10 mg tablet  Claritin 10 mg capsule  Losartan-Hydrochlorothiazide 100 mg-12.5 mg tablet  Metformin Er Gastric 500 mg tablet, er gastric retention 24 hr  Probiotic     GU PSH: Cysto Uretero Lithotripsy       PSH Notes: ear drum drains    NON-GU PSH: Tonsillectomy     GU PMH: None   NON-GU PMH: Cardiac murmur, unspecified Depression Diabetes Type 2 Hypercholesterolemia Hypertension    FAMILY HISTORY: Diabetes - Runs in Family Kidney Cancer - Father   SOCIAL HISTORY: Marital Status: Married Preferred Language: English; Race: White Current Smoking Status: Patient has never smoked.   Tobacco Use Assessment Completed: Used Tobacco in last 30 days? Social Drinker.  Drinks 1 caffeinated drink  per day.    REVIEW OF SYSTEMS:    GU Review Male:   Patient reports frequent urination and get up at night to urinate. Patient denies hard to postpone urination, burning/ pain with urination, leakage of urine, stream starts and stops, trouble starting your stream, have to strain to urinate , erection problems, and penile pain.  Gastrointestinal (Upper):   Patient reports nausea and vomiting. Patient denies indigestion/ heartburn.  Gastrointestinal (Lower):   Patient reports constipation. Patient denies diarrhea.  Constitutional:   Patient denies night sweats, fatigue, fever, and weight loss.  Skin:   Patient denies skin rash/ lesion and itching.  Eyes:   Patient denies blurred vision and double vision.  Ears/ Nose/ Throat:   Patient denies sore throat and sinus problems.  Hematologic/Lymphatic:   Patient denies swollen glands and easy bruising.  Cardiovascular:   Patient denies leg swelling and chest pains.  Respiratory:   Patient denies cough and shortness of breath.  Endocrine:   Patient denies excessive thirst.  Musculoskeletal:   Patient denies back pain and joint pain.  Neurological:   Patient denies headaches and dizziness.  Psychologic:   Patient denies depression and anxiety.   Notes: history of kidney stones     VITAL SIGNS:      01/08/2020 08:45 AM  Weight 200 lb / 90.72 kg  Height 68 in / 172.72 cm  BP 160/99 mmHg  Heart Rate 76 /min  Temperature 98.2 F / 36.7 C  BMI 30.4 kg/m   PAST DATA REVIEWED:  Source  Of History:  Patient  Records Review:   Previous Patient Records  X-Ray Review: C.T. Abdomen/Pelvis: Reviewed Films. Reviewed Report. Discussed With Patient.     PROCEDURES:         Renal Ultrasound (Limited) [id number]  Kidney:LEFT Length: 10.4 cm Depth:6.2 cm Cortical Width: 1.2 cm Width:4.5 cm    Left Kidney/Ureter:  There is a mid pole cyst measuring 3.2x3.3x3.0 cm that appears simple. There are 2 questionable mid pole calcs that appear non obstructive. They  are 3.7 and 4.7 mm  Right Kidney/Ureter:    Bladder:  VOLUME 43.2 ML      . Patient confirmed No Neulasta OnPro Device.   No hydronephrosis seen involving the left kidney. No apparent stone shadowing or solid parenchymal lesions observed. The left renal echogenecity appears normal. The bladder lumen has a smooth contour with no masses or debris.          Urinalysis Dipstick Dipstick Cont'd  Color: Yellow Bilirubin: Neg mg/dL  Appearance: Clear Ketones: Neg mg/dL  Specific Gravity: 1.025 Blood: Neg ery/uL  pH: 5.5 Protein: Neg mg/dL  Glucose: Neg mg/dL Urobilinogen: 0.2 mg/dL    Nitrites: Neg    Leukocyte Esterase: Neg leu/uL    ASSESSMENT:      ICD-10 Details  1 GU:   Flank Pain - R10.84 Left, Undiagnosed New Problem  2   Renal and ureteral calculus - N20.2 Left, Undiagnosed New Problem  3   Ureteral obstruction secondary to calculous - N13.2      PLAN:            Medications New Meds: Tamsulosin Hcl 0.4 mg capsule 1 capsule PO Daily   #30  0 Refill(s)            Orders X-Rays: Renal Ultrasound (Limited) - Left RUS          Schedule Return Visit/Planned Activity: ASAP - Schedule Surgery          Document Letter(s):  Created for Patient: Clinical Summary         Notes:   -RUS today shows no signs of left hydronephrosis and a benign, simple renal cyst  The risks, benefits and alternatives of cystoscopy with LEFT ureteroscopy, laser lithotripsy and ureteral stent placement was discussed the patient. Risks included, but are not limited to: bleeding, urinary tract infection, ureteral injury/avulsion, ureteral stricture formation, retained stone fragments, the possibility that multiple surgeries may be required to treat the stone(s), MI, stroke, PE and the inherent risks of general anesthesia. The patient voices understanding and wishes to proceed.

## 2020-01-15 NOTE — Transfer of Care (Signed)
Immediate Anesthesia Transfer of Care Note  Patient: Jonathan [name]  Procedure(s) Performed: CYSTOSCOPY/URETEROSCOPY/HOLMIUM LASER/STENT PLACEMENT (Left Ureter)  Patient Location: PACU  Anesthesia Type:General  Level of Consciousness: awake, alert  and oriented  Airway & Oxygen Therapy: Patient Spontanous Breathing and Patient connected to nasal cannula oxygen  Post-op Assessment: Report given to RN and Post -op Vital signs reviewed and stable  Post vital signs: Reviewed and stable  Last Vitals:  Vitals Value Taken Time  BP    Temp    Pulse 89 01/15/20 1444  Resp 7 01/15/20 1443  SpO2 93 % 01/15/20 1444  Vitals shown include unvalidated device data.  Last Pain:  Vitals:   01/15/20 1150  PainSc: 0-No pain      Patients Stated Pain Goal: 4 ([id number])  Complications: No apparent anesthesia complications

## 2020-01-15 NOTE — Discharge Instructions (Signed)

## 2020-01-15 NOTE — Anesthesia Preprocedure Evaluation (Signed)
Anesthesia Evaluation  Patient identified by MRN, date of birth, ID band Patient awake    Reviewed: Allergy & Precautions, NPO status , Patient's Chart, lab work & pertinent test results  Airway Mallampati: II  TM Distance: >3 FB Neck ROM: Full    Dental no notable dental hx. (+) Teeth Intact, Dental Advisory Given   Pulmonary neg pulmonary ROS,    Pulmonary exam normal breath sounds clear to auscultation       Cardiovascular hypertension, Pt. on medications Normal cardiovascular exam Rhythm:Regular Rate:Normal  HLD   Neuro/Psych PSYCHIATRIC DISORDERS Anxiety Depression negative neurological ROS     GI/Hepatic negative GI ROS, Neg liver ROS,   Endo/Other  diabetes, Well Controlled, Oral Hypoglycemic AgentsObese BMI 33   Renal/GU negative Renal ROS  negative genitourinary   Musculoskeletal negative musculoskeletal ROS (+)   Abdominal   Peds  Hematology negative hematology ROS (+)   Anesthesia Other Findings   Reproductive/Obstetrics                            Anesthesia Physical Anesthesia Plan  ASA: II  Anesthesia Plan: General   Post-op Pain Management:    Induction: Intravenous  PONV Risk Score and Plan: 2 and Ondansetron, Dexamethasone and Midazolam  Airway Management Planned: LMA  Additional Equipment:   Intra-op Plan:   Post-operative Plan: Extubation in OR  Informed Consent: I have reviewed the patients History and Physical, chart, labs and discussed the procedure including the risks, benefits and alternatives for the proposed anesthesia with the patient or authorized representative who has indicated his/her understanding and acceptance.     Dental advisory given  Plan Discussed with: CRNA  Anesthesia Plan Comments:         Anesthesia Quick Evaluation

## 2020-01-15 NOTE — Anesthesia Procedure Notes (Signed)
Procedure Name: LMA Insertion Date/Time: 01/15/2020 1:46 PM Performed by: Ervie Mccard D, CRNA Pre-anesthesia Checklist: Patient identified, Emergency Drugs available, Suction available and Patient being monitored Patient Re-evaluated:Patient Re-evaluated prior to induction Oxygen Delivery Method: Circle system utilized Preoxygenation: Pre-oxygenation with 100% oxygen Induction Type: IV induction Ventilation: Mask ventilation without difficulty Tube type: Oral Number of attempts: 1 Placement Confirmation: positive ETCO2 and breath sounds checked- equal and bilateral Tube secured with: Tape Dental Injury: Teeth and Oropharynx as per pre-operative assessment

## 2020-01-22 DIAGNOSIS — N202 Calculus of kidney with calculus of ureter: Secondary | ICD-10-CM | POA: Diagnosis not present

## 2020-01-22 DIAGNOSIS — R8271 Bacteriuria: Secondary | ICD-10-CM | POA: Diagnosis not present

## 2020-01-24 ENCOUNTER — Ambulatory Visit (INDEPENDENT_AMBULATORY_CARE_PROVIDER_SITE_OTHER): Payer: BC Managed Care – PPO | Admitting: Psychology

## 2020-01-24 DIAGNOSIS — F330 Major depressive disorder, recurrent, mild: Secondary | ICD-10-CM | POA: Diagnosis not present

## 2020-01-24 DIAGNOSIS — F900 Attention-deficit hyperactivity disorder, predominantly inattentive type: Secondary | ICD-10-CM

## 2020-02-06 ENCOUNTER — Ambulatory Visit (INDEPENDENT_AMBULATORY_CARE_PROVIDER_SITE_OTHER): Payer: BC Managed Care – PPO | Admitting: Psychology

## 2020-02-06 DIAGNOSIS — F330 Major depressive disorder, recurrent, mild: Secondary | ICD-10-CM | POA: Diagnosis not present

## 2020-02-07 ENCOUNTER — Encounter: Payer: Self-pay | Admitting: Psychiatry

## 2020-02-07 ENCOUNTER — Other Ambulatory Visit: Payer: Self-pay

## 2020-02-07 ENCOUNTER — Ambulatory Visit (INDEPENDENT_AMBULATORY_CARE_PROVIDER_SITE_OTHER): Payer: BC Managed Care – PPO | Admitting: Psychiatry

## 2020-02-07 DIAGNOSIS — F330 Major depressive disorder, recurrent, mild: Secondary | ICD-10-CM

## 2020-02-07 DIAGNOSIS — F902 Attention-deficit hyperactivity disorder, combined type: Secondary | ICD-10-CM

## 2020-02-07 MED ORDER — VORTIOXETINE HBR 10 MG PO TABS: 10.0000 mg | ORAL_TABLET | Freq: Every day | ORAL | 1 refills | Status: DC

## 2020-02-07 NOTE — Progress Notes (Signed)
Jonathan [name] [id number] [birth year] 59 y.o.  Subjective:   Patient ID:  Jonathan [name] is a 59 y.o. (DOB 1961-10-02) male.  Chief Complaint:  Chief Complaint  Patient presents with  . Follow-up    Depression, ADD    HPI Jonathan [name] presents to the office today for follow-up of depression and ADD. He reports that he is noticing "more calm, more focus." He reports that he is better able to let go of things that he does not need to worry about. He reports that he is now able to make lists and prioritize more effectively. He feels he is better at problem solving. "Less attentive to things that don't really matter." Describes being less distractible. Feels he is better able to express himself in conversation at home and at work. "I'm feeling what I wanted from the Trintellix." Anxiety has improved and notices occ anxiety later in the day. He noticed his anxiety is less in some situations that would have usually triggered some degree of anxiety. He notices that when he makes a blunder he is better able to let it go. Able to make decisions more easily. Feels less fidgety. Improved mood. He reports that he has had less irritability. Sleep has been disrupted due to kidney stone and surgery. Some slight improvement in insomnia. Appetite has been even. Has been enjoying getting out and walking. Has gotten invovled again with a creativity group. Is picking back up with a singing group. Has felt a desire to get more organized. Some improvement in energy and motivation. Fatigue has improved some. "Mental fatigue is better." Denies SI.   He is now on the 4th week of Trintellix samples.   Recently had first covid vaccine.   Past Psychiatric Medication Trials: Citalopram- Sexual side effects, fatigue. Partially effective for depression. Increased to 15 mg in the last month and noticed more fatigue. He reports that he has been taking 10 mg most of the time since then.  Trintellix Wellbutrin- Took  25 years ago.   PHQ2-9     Telemedicine from 08/02/2019 in [place] at [facility] from 10/01/2018 in [place] at [facility] from 05/24/2017 in [place] at [facility] Total Score  4  0  0  PHQ-9 Total Score  15  --  --       Review of Systems:  Review of Systems  Gastrointestinal: Negative for nausea.  Musculoskeletal: Negative for gait problem.  Neurological: Negative for tremors.  Psychiatric/Behavioral:       Please refer to HPI    Medications: I have reviewed the patient's current medications.  Current Outpatient Medications  Medication Sig Dispense Refill  . loratadine (CLARITIN) 10 MG tablet Take 10 mg by mouth every other day.    . losartan-hydrochlorothiazide (HYZAAR) 50-12.5 MG tablet TAKE 2 TABLETS BY MOUTH EVERY DAY (Patient taking differently: Take 2 tablets by mouth daily. ) 180 tablet 3  . metFORMIN (GLUCOPHAGE-XR) 500 MG 24 hr tablet TAKE 1 TABLET (500 MG TOTAL) BY MOUTH DAILY WITH BREAKFAST. INCREASE TO 2 TIMES DAILY AFTER 2 WKS (Patient taking differently: Take 500 mg by mouth 2 (two) times daily. ) 180 tablet 1  . Probiotic Product (PROBIOTIC PO) Take 1 tablet by mouth every other day.    . vortioxetine HBr (TRINTELLIX) 10 MG TABS tablet Take 1 tablet (10 mg total) by mouth daily. (Patient taking differently: Take 10 mg by mouth daily. 5 mg daily as of 01-14-2020) 30 tablet 0  .  vortioxetine HBr (TRINTELLIX) 10 MG TABS tablet Take 1 tablet (10 mg total) by mouth daily. 30 tablet 1   No current facility-administered medications for this visit.    Medication Side Effects: None  Vomited once after taking medications. Had nausea for about 3 days.   Allergies:  Allergies  Allergen Reactions  . Zocor [Simvastatin]     ? myalgias  . Penicillins Hives    Per pt - happened in childhood - hives possible reaction    Past Medical History:  Diagnosis Date  . Allergic rhinitis   . Anxiety   . Depression   .  Diabetes mellitus without complication ([place])    type 2  . History of kidney stones   . History of nephrolithiasis   . Hyperlipidemia   . Hypertension   . Sebaceous cyst     Family History  Problem Relation Age of Onset  . Diabetes Father   . Hypertension Father   . Kidney cancer Father        now on dialysis  . Kidney Stones Father   . Kidney Stones Brother   . Huntington's disease Maternal Grandfather   . Depression Daughter   . Anxiety disorder Daughter   . ADD / ADHD Daughter     Social History   Socioeconomic History  . Marital status: Married    Spouse name: Not on file  . Number of children: 1  . Years of education: Not on file  . Highest education level: Not on file  Occupational History  . Occupation: MINISTER    Employer: [employer]  Tobacco Use  . Smoking status: Never Smoker  . Smokeless tobacco: Never Used  Substance and Sexual Activity  . Alcohol use: Yes    Comment: less than 1 a week  . Drug use: No  . Sexual activity: Yes  Other Topics Concern  . Not on file  Social History Narrative   Married   Regular exercise- no   HH of 3   Dog car and lizard   Sleep wakening at times   New job [employer]   Per week reg sleep.    Social Determinants of Health   Financial Resource Strain:   . Difficulty of Paying Living Expenses: Not on file  Food Insecurity:   . Worried About Charity fundraiser in the Last Year: Not on file  . Ran Out of Food in the Last Year: Not on file  Transportation Needs:   . Lack of Transportation (Medical): Not on file  . Lack of Transportation (Non-Medical): Not on file  Physical Activity:   . Days of Exercise per Week: Not on file  . Minutes of Exercise per Session: Not on file  Stress:   . Feeling of Stress : Not on file  Social Connections:   . Frequency of Communication with Friends and Family: Not on file  . Frequency of Social Gatherings with Friends and Family: Not on file  . Attends Religious  Services: Not on file  . Active Member of Clubs or Organizations: Not on file  . Attends Archivist Meetings: Not on file  . Marital Status: Not on file  Intimate Partner Violence:   . Fear of Current or Ex-Partner: Not on file  . Emotionally Abused: Not on file  . Physically Abused: Not on file  . Sexually Abused: Not on file    Past Medical History, Surgical history, Social history, and Family history were reviewed and updated  as appropriate.   Please see review of systems for further details on the patient's review from today.   Objective:   Physical Exam:  There were no vitals taken for this visit.  Physical Exam Constitutional:      General: He is not in acute distress. Musculoskeletal:        General: No deformity.  Neurological:     Mental Status: He is alert and oriented to person, place, and time.     Coordination: Coordination normal.  Psychiatric:        Attention and Perception: Attention and perception normal. He does not perceive auditory or visual hallucinations.        Mood and Affect: Mood normal. Mood is not anxious or depressed. Affect is not labile, blunt, angry or inappropriate.        Speech: Speech normal.        Behavior: Behavior normal.        Thought Content: Thought content normal. Thought content is not paranoid or delusional. Thought content does not include homicidal or suicidal ideation. Thought content does not include homicidal or suicidal plan.        Cognition and Memory: Cognition and memory normal.        Judgment: Judgment normal.     Comments: Insight intact     Lab Review:     Component Value Date/Time   NA 141 01/15/2020 1150   K 3.2 (L) 01/15/2020 1150   CL 103 01/15/2020 1150   CO2 23 01/05/2020 1014   GLUCOSE 139 (H) 01/15/2020 1150   BUN 16 01/15/2020 1150   CREATININE 1.10 01/15/2020 1150   CALCIUM 9.2 01/05/2020 1014   PROT 6.7 01/05/2020 1014   ALBUMIN 3.9 01/05/2020 1014   AST 24 01/05/2020 1014   ALT 33  01/05/2020 1014   ALKPHOS 54 01/05/2020 1014   BILITOT 0.8 01/05/2020 1014   GFRNONAA 52 (L) 01/05/2020 1014   GFRAA >60 01/05/2020 1014       Component Value Date/Time   WBC 12.4 (H) 01/05/2020 1014   RBC 5.57 01/05/2020 1014   HGB 18.4 (H) 01/15/2020 1150   HCT 54.0 (H) 01/15/2020 1150   PLT 243 01/05/2020 1014   MCV 93.7 01/05/2020 1014   MCV 94.2 04/29/2013 0936   MCH 31.1 01/05/2020 1014   MCHC 33.1 01/05/2020 1014   RDW 12.2 01/05/2020 1014   LYMPHSABS 3.0 09/05/2018 0754   MONOABS 0.9 09/05/2018 0754   EOSABS 0.3 09/05/2018 0754   BASOSABS 0.1 09/05/2018 0754    No results found for: POCLITH, LITHIUM   No results found for: PHENYTOIN, PHENOBARB, VALPROATE, CBMZ   .res Assessment: Plan:   Will continue Trintellix 10 mg daily for mood and cognitive processing since patient is reporting some movement after taking 5 mg for 1 week and 10 mg for 2 weeks.  Discussed that patient is likely to continue to experience further benefit with Trintellix since he has not been on 10 mg for at least 4 weeks.  Will therefore continue Trintellix at 10 mg dose for another 4 weeks and then re-evaluate. Recommend continuing to see Caroline Sauger, LCSW for therapy. Patient to follow-up in 4 weeks or sooner if clinically indicated. Patient advised to contact office with any questions, adverse effects, or acute worsening in signs and symptoms.  Makail was seen today for follow-up.  Diagnoses and all orders for this visit:  Mild episode of recurrent major depressive disorder (HCC) -     vortioxetine HBr (  TRINTELLIX) 10 MG TABS tablet; Take 1 tablet (10 mg total) by mouth daily.  Attention deficit hyperactivity disorder (ADHD), combined type     Please see After Visit Summary for patient specific instructions.  Future Appointments  Date Time Provider [place]  02/20/2020  9:00 AM Cottle, Lucious Groves, LCSW [facility] None  03/05/2020 10:30 AM Thayer Headings, PMHNP [facility] None    No  orders of the defined types were placed in this encounter.   -------------------------------

## 2020-02-20 ENCOUNTER — Ambulatory Visit (INDEPENDENT_AMBULATORY_CARE_PROVIDER_SITE_OTHER): Payer: BC Managed Care – PPO | Admitting: Psychology

## 2020-02-20 DIAGNOSIS — F330 Major depressive disorder, recurrent, mild: Secondary | ICD-10-CM | POA: Diagnosis not present

## 2020-02-20 DIAGNOSIS — F4323 Adjustment disorder with mixed anxiety and depressed mood: Secondary | ICD-10-CM | POA: Diagnosis not present

## 2020-02-20 DIAGNOSIS — F900 Attention-deficit hyperactivity disorder, predominantly inattentive type: Secondary | ICD-10-CM

## 2020-02-24 ENCOUNTER — Other Ambulatory Visit: Payer: Self-pay

## 2020-02-24 ENCOUNTER — Ambulatory Visit (INDEPENDENT_AMBULATORY_CARE_PROVIDER_SITE_OTHER): Payer: BC Managed Care – PPO | Admitting: Psychiatry

## 2020-02-24 ENCOUNTER — Encounter: Payer: Self-pay | Admitting: Psychiatry

## 2020-02-24 DIAGNOSIS — F902 Attention-deficit hyperactivity disorder, combined type: Secondary | ICD-10-CM

## 2020-02-24 DIAGNOSIS — F3341 Major depressive disorder, recurrent, in partial remission: Secondary | ICD-10-CM

## 2020-02-24 MED ORDER — VORTIOXETINE HBR 5 MG PO TABS: 5.0000 mg | ORAL_TABLET | Freq: Every day | ORAL | 1 refills | Status: DC

## 2020-02-24 NOTE — Progress Notes (Signed)
Jonathan [name] [id number] [birth year] 59 y.o.  Subjective:   Patient ID:  Jonathan [name] is a 59 y.o. (DOB May 28, 1961) male.  Chief Complaint:  Chief Complaint  Patient presents with  . Medication Problem    Fatigue  . Follow-up    h/o depression, ADD    HPI Jonathan [name] presents to the office today for follow-up of depression. He reports that he is doing well "other than feeling exhausted all the time." He reports that in the past he would wake up without difficulty and now is having difficulty getting up and getting going in the morning. He reports that energy improves once he gets up and gets going. He reports that even with more sleep he does not feel rested. He reports that he has been tired today after working yesterday and a meeting last night. He reports that fatigue has occurred in the last few weeks. He continues to notice some very mild nausea right after taking Trintellix in the morning. Motivation has been high. He reports that his mood has been "very good." Denies affective dulling and reports that he is continuing to have creative ideas and interests. Continues to feel more calm and reports that anxiety is well controlled. He reports feeling more present at times and not feeling rushed or thinking about what else he needs to do. Feels like sleep is not as restorative. Has been sleeping as much or more than he was previously. Appetite has been decreased. Less hungry in the morning than he has been in the past. Eating an adequate amount. Has been reading and able to focus better. Has started reading again for pleasure. Describes being better able to make decisions and be more concise. Denies SI.    Past Psychiatric Medication Trials: Citalopram- Sexual side effects, fatigue. Partially effective for depression. Increased to 15 mg in the last month and noticed more fatigue. He reports that he has been taking 10 mg most of the time since then.  Trintellix Wellbutrin- Took 25  years ago.   PHQ2-9     Telemedicine from 08/02/2019 in [place] at [facility] from 10/01/2018 in [place] at [facility] from 05/24/2017 in [place] at [facility] Total Score  4  0  0  PHQ-9 Total Score  15  --  --       Review of Systems:  Review of Systems  Cardiovascular: Negative for palpitations.  Musculoskeletal: Negative for gait problem.  Neurological: Negative for tremors.  Psychiatric/Behavioral:       Please refer to HPI    Medications: I have reviewed the patient's current medications.  Current Outpatient Medications  Medication Sig Dispense Refill  . loratadine (CLARITIN) 10 MG tablet Take 10 mg by mouth every other day.    . losartan-hydrochlorothiazide (HYZAAR) 50-12.5 MG tablet TAKE 2 TABLETS BY MOUTH EVERY DAY (Patient taking differently: Take 2 tablets by mouth daily. ) 180 tablet 3  . metFORMIN (GLUCOPHAGE-XR) 500 MG 24 hr tablet TAKE 1 TABLET (500 MG TOTAL) BY MOUTH DAILY WITH BREAKFAST. INCREASE TO 2 TIMES DAILY AFTER 2 WKS (Patient taking differently: Take 500 mg by mouth 2 (two) times daily. ) 180 tablet 1  . Probiotic Product (PROBIOTIC PO) Take 1 tablet by mouth every other day.    . vortioxetine HBr (TRINTELLIX) 5 MG TABS tablet Take 1 tablet (5 mg total) by mouth daily. 30 tablet 1   No current facility-administered medications for this visit.    Medication  Side Effects: None  Allergies:  Allergies  Allergen Reactions  . Zocor [Simvastatin]     ? myalgias  . Penicillins Hives    Per pt - happened in childhood - hives possible reaction    Past Medical History:  Diagnosis Date  . Allergic rhinitis   . Anxiety   . Depression   . Diabetes mellitus without complication ([place])    type 2  . History of kidney stones   . History of nephrolithiasis   . Hyperlipidemia   . Hypertension   . Sebaceous cyst     Family History  Problem Relation Age of Onset  . Diabetes Father   .  Hypertension Father   . Kidney cancer Father        now on dialysis  . Kidney Stones Father   . Kidney Stones Brother   . Huntington's disease Maternal Grandfather   . Depression Daughter   . Anxiety disorder Daughter   . ADD / ADHD Daughter     Social History   Socioeconomic History  . Marital status: Married    Spouse name: Not on file  . Number of children: 1  . Years of education: Not on file  . Highest education level: Not on file  Occupational History  . Occupation: MINISTER    Employer: [employer]  Tobacco Use  . Smoking status: Never Smoker  . Smokeless tobacco: Never Used  Substance and Sexual Activity  . Alcohol use: Yes    Comment: less than 1 a week  . Drug use: No  . Sexual activity: Yes  Other Topics Concern  . Not on file  Social History Narrative   Married   Regular exercise- no   HH of 3   Dog car and lizard   Sleep wakening at times   New job [employer]   Per week reg sleep.    Social Determinants of Health   Financial Resource Strain:   . Difficulty of Paying Living Expenses:   Food Insecurity:   . Worried About Charity fundraiser in the Last Year:   . Arboriculturist in the Last Year:   Transportation Needs:   . Film/video editor (Medical):   Marland Kitchen Lack of Transportation (Non-Medical):   Physical Activity:   . Days of Exercise per Week:   . Minutes of Exercise per Session:   Stress:   . Feeling of Stress :   Social Connections:   . Frequency of Communication with Friends and Family:   . Frequency of Social Gatherings with Friends and Family:   . Attends Religious Services:   . Active Member of Clubs or Organizations:   . Attends Archivist Meetings:   Marland Kitchen Marital Status:   Intimate Partner Violence:   . Fear of Current or Ex-Partner:   . Emotionally Abused:   Marland Kitchen Physically Abused:   . Sexually Abused:     Past Medical History, Surgical history, Social history, and Family history were reviewed and updated  as appropriate.   Please see review of systems for further details on the patient's review from today.   Objective:   Physical Exam:  There were no vitals taken for this visit.  Physical Exam Constitutional:      General: He is not in acute distress. Musculoskeletal:        General: No deformity.  Neurological:     Mental Status: He is alert and oriented to person, place, and time.  Coordination: Coordination normal.  Psychiatric:        Attention and Perception: Attention and perception normal. He does not perceive auditory or visual hallucinations.        Mood and Affect: Mood normal. Mood is not anxious or depressed. Affect is not labile, blunt, angry or inappropriate.        Speech: Speech normal.        Behavior: Behavior normal.        Thought Content: Thought content normal. Thought content is not paranoid or delusional. Thought content does not include homicidal or suicidal ideation. Thought content does not include homicidal or suicidal plan.        Cognition and Memory: Cognition and memory normal.        Judgment: Judgment normal.     Comments: Insight intact     Lab Review:     Component Value Date/Time   NA 141 01/15/2020 1150   K 3.2 (L) 01/15/2020 1150   CL 103 01/15/2020 1150   CO2 23 01/05/2020 1014   GLUCOSE 139 (H) 01/15/2020 1150   BUN 16 01/15/2020 1150   CREATININE 1.10 01/15/2020 1150   CALCIUM 9.2 01/05/2020 1014   PROT 6.7 01/05/2020 1014   ALBUMIN 3.9 01/05/2020 1014   AST 24 01/05/2020 1014   ALT 33 01/05/2020 1014   ALKPHOS 54 01/05/2020 1014   BILITOT 0.8 01/05/2020 1014   GFRNONAA 52 (L) 01/05/2020 1014   GFRAA >60 01/05/2020 1014       Component Value Date/Time   WBC 12.4 (H) 01/05/2020 1014   RBC 5.57 01/05/2020 1014   HGB 18.4 (H) 01/15/2020 1150   HCT 54.0 (H) 01/15/2020 1150   PLT 243 01/05/2020 1014   MCV 93.7 01/05/2020 1014   MCV 94.2 04/29/2013 0936   MCH 31.1 01/05/2020 1014   MCHC 33.1 01/05/2020 1014   RDW 12.2  01/05/2020 1014   LYMPHSABS 3.0 09/05/2018 0754   MONOABS 0.9 09/05/2018 0754   EOSABS 0.3 09/05/2018 0754   BASOSABS 0.1 09/05/2018 0754    No results found for: POCLITH, LITHIUM   No results found for: PHENYTOIN, PHENOBARB, VALPROATE, CBMZ   .res Assessment: Plan:   Case staffed with Dr. Clovis Pu. Will decrease Trintellix to 5 mg po qd due to pt having fatigue upon awakening and diminished sleep quality over the past couple of weeks after increasing Trintellix to 10 mg po qd. Will decrease dose to 5 mg po qd to determine if s/s can be adequately controlled at this dose without side effects. Pt advised to call office if he experiences any recurrence of previous s/s of depression or inattention at lower dose.  Recommend continuing psychotherapy with Bambi Cottle, LCSW. Pt to f/u in 4 weeks or sooner if clinically indicated.  Patient advised to contact office with any questions, adverse effects, or acute worsening in signs and symptoms.  Jonathan [name] was seen today for medication problem and follow-up.  Diagnoses and all orders for this visit:  Recurrent major depressive disorder, in partial remission ([place]) -     vortioxetine HBr (TRINTELLIX) 5 MG TABS tablet; Take 1 tablet (5 mg total) by mouth daily.  Attention deficit hyperactivity disorder (ADHD), combined type     Please see After Visit Summary for patient specific instructions.  Future Appointments  Date Time Provider [place]  03/05/2020  9:00 AM Cottle, Lucious Groves, LCSW [facility] None  03/23/2020  1:30 PM Thayer Headings, PMHNP [facility] None    No orders of the defined  types were placed in this encounter.   -------------------------------

## 2020-03-02 DIAGNOSIS — N132 Hydronephrosis with renal and ureteral calculous obstruction: Secondary | ICD-10-CM | POA: Diagnosis not present

## 2020-03-02 DIAGNOSIS — N202 Calculus of kidney with calculus of ureter: Secondary | ICD-10-CM | POA: Diagnosis not present

## 2020-03-05 ENCOUNTER — Ambulatory Visit (INDEPENDENT_AMBULATORY_CARE_PROVIDER_SITE_OTHER): Payer: BC Managed Care – PPO | Admitting: Psychology

## 2020-03-05 ENCOUNTER — Ambulatory Visit: Payer: BC Managed Care – PPO | Admitting: Psychiatry

## 2020-03-05 DIAGNOSIS — F330 Major depressive disorder, recurrent, mild: Secondary | ICD-10-CM

## 2020-03-11 ENCOUNTER — Telehealth: Payer: Self-pay

## 2020-03-11 NOTE — Telephone Encounter (Signed)
Prior authorization submitted and approved for TRINTELLIX 5 MG, through [employer], effective 02/10/2020-03/11/2021  [id number]  Submitted through cover my meds

## 2020-03-22 ENCOUNTER — Other Ambulatory Visit: Payer: Self-pay | Admitting: Internal Medicine

## 2020-03-23 ENCOUNTER — Ambulatory Visit (INDEPENDENT_AMBULATORY_CARE_PROVIDER_SITE_OTHER): Payer: BC Managed Care – PPO | Admitting: Psychiatry

## 2020-03-23 ENCOUNTER — Encounter: Payer: Self-pay | Admitting: Psychiatry

## 2020-03-23 ENCOUNTER — Other Ambulatory Visit: Payer: Self-pay

## 2020-03-23 DIAGNOSIS — F902 Attention-deficit hyperactivity disorder, combined type: Secondary | ICD-10-CM | POA: Diagnosis not present

## 2020-03-23 DIAGNOSIS — F3341 Major depressive disorder, recurrent, in partial remission: Secondary | ICD-10-CM

## 2020-03-23 DIAGNOSIS — F3342 Major depressive disorder, recurrent, in full remission: Secondary | ICD-10-CM

## 2020-03-23 MED ORDER — VORTIOXETINE HBR 5 MG PO TABS: 5.0000 mg | ORAL_TABLET | Freq: Every day | ORAL | 0 refills | Status: DC

## 2020-03-23 NOTE — Progress Notes (Signed)
ERI SCHU [id number] 08-25-1961 59 y.o.  Subjective:   Patient ID:  Jonathan [name] is a 59 y.o. (DOB 04-Jul-1961) male.  Chief Complaint:  Chief Complaint  Patient presents with  . Follow-up    ADD, anxiety, and depression    HPI [name] presents to the office today for follow-up of depression, anxiety, and ADD. He reports that his energy has improved in the last month with decrease of Trintellix back to 5 mg. He reports that his focus has been good and has been able to engage with others. He has been sleeping well. Mood has been stable. He reports that he is no longer getting anxious or worried about things that he was previously worried about. He reports that he has been abel to focus to read novels for the first time in years. Energy is good and able to get out of bed in the morning without difficulty. Reports that his motivation has been good- "I'm eager" about work, family life, and other hobbies. Appetite has been "noticeably lower" and is not thinking about food as much. He reports that he is eating an adequate amount. He reports that he now recognizes that he is no longer overly attentive to things. He reports that he has been able to help groups focus as well. Denies SI.   [place] was [place] to see colleagues and friends. Has been playing disc golf.   Continues to see Bambi Cottle, LCSW.   Past Psychiatric Medication Trials: Citalopram- Sexual side effects, fatigue. Partially effective for depression. Increased to 15 mg in the last month and noticed more fatigue. He reports that he has been taking 10 mg most of the time since then. Trintellix Wellbutrin- Took 25 years ago.  PHQ2-9     Telemedicine from 08/02/2019 in [place] at [facility] from 10/01/2018 in [place] at [facility] from 05/24/2017 in [place] at [facility] Total Score  4  0  0   PHQ-9 Total Score  15  --  --       Review of Systems:  Review of Systems  Constitutional: Negative for fatigue.  Respiratory: Negative for shortness of breath.   Musculoskeletal: Negative for gait problem.  Allergic/Immunologic: Positive for environmental allergies.  Psychiatric/Behavioral:       Please refer to HPI    Medications: I have reviewed the patient's current medications.  Current Outpatient Medications  Medication Sig Dispense Refill  . loratadine (CLARITIN) 10 MG tablet Take 10 mg by mouth every other day.    . losartan-hydrochlorothiazide (HYZAAR) 50-12.5 MG tablet TAKE 2 TABLETS BY MOUTH EVERY DAY (Patient taking differently: Take 2 tablets by mouth daily. ) 180 tablet 3  . metFORMIN (GLUCOPHAGE-XR) 500 MG 24 hr tablet Take 1 tablet (500 mg total) by mouth 2 (two) times daily. Please schedule follow up for further refills. 60 tablet 0  . Probiotic Product (PROBIOTIC PO) Take 1 tablet by mouth every other day.    . vortioxetine HBr (TRINTELLIX) 5 MG TABS tablet Take 1 tablet (5 mg total) by mouth daily. 90 tablet 0   No current facility-administered medications for this visit.    Medication Side Effects: None Brief, mild nausea about 10 minutes after taking Trintellix that typically lasts about 2 minutes.   Allergies:  Allergies  Allergen Reactions  . Zocor [Simvastatin]     ? myalgias  . Penicillins Hives    Per  pt - happened in childhood - hives possible reaction    Past Medical History:  Diagnosis Date  . Allergic rhinitis   . Anxiety   . Depression   . Diabetes mellitus without complication ([place])    type 2  . History of kidney stones   . History of nephrolithiasis   . Hyperlipidemia   . Hypertension   . Sebaceous cyst     Family History  Problem Relation Age of Onset  . Diabetes Father   . Hypertension Father   . Kidney cancer Father        now on dialysis  . Kidney Stones Father   . Kidney Stones Brother   . Huntington's disease  Maternal Grandfather   . Depression Daughter   . Anxiety disorder Daughter   . ADD / ADHD Daughter     Social History   Socioeconomic History  . Marital status: Married    Spouse name: Not on file  . Number of children: 1  . Years of education: Not on file  . Highest education level: Not on file  Occupational History  . Occupation: MINISTER    Employer: [employer]  Tobacco Use  . Smoking status: Never Smoker  . Smokeless tobacco: Never Used  Substance and Sexual Activity  . Alcohol use: Yes    Comment: less than 1 a week  . Drug use: No  . Sexual activity: Yes  Other Topics Concern  . Not on file  Social History Narrative   Married   Regular exercise- no   HH of 3   Dog car and lizard   Sleep wakening at times   New job [employer]   Per week reg sleep.    Social Determinants of Health   Financial Resource Strain:   . Difficulty of Paying Living Expenses:   Food Insecurity:   . Worried About Charity fundraiser in the Last Year:   . Arboriculturist in the Last Year:   Transportation Needs:   . Film/video editor (Medical):   Marland Kitchen Lack of Transportation (Non-Medical):   Physical Activity:   . Days of Exercise per Week:   . Minutes of Exercise per Session:   Stress:   . Feeling of Stress :   Social Connections:   . Frequency of Communication with Friends and Family:   . Frequency of Social Gatherings with Friends and Family:   . Attends Religious Services:   . Active Member of Clubs or Organizations:   . Attends Archivist Meetings:   Marland Kitchen Marital Status:   Intimate Partner Violence:   . Fear of Current or Ex-Partner:   . Emotionally Abused:   Marland Kitchen Physically Abused:   . Sexually Abused:     Past Medical History, Surgical history, Social history, and Family history were reviewed and updated as appropriate.   Please see review of systems for further details on the patient's review from today.   Objective:   Physical Exam:  There  were no vitals taken for this visit.  Physical Exam Constitutional:      General: He is not in acute distress. Musculoskeletal:        General: No deformity.  Neurological:     Mental Status: He is alert and oriented to person, place, and time.     Coordination: Coordination normal.  Psychiatric:        Attention and Perception: Attention and perception normal. He does not perceive auditory or visual  hallucinations.        Mood and Affect: Mood normal. Mood is not anxious or depressed. Affect is not labile, blunt, angry or inappropriate.        Speech: Speech normal.        Behavior: Behavior normal.        Thought Content: Thought content normal. Thought content is not paranoid or delusional. Thought content does not include homicidal or suicidal ideation. Thought content does not include homicidal or suicidal plan.        Cognition and Memory: Cognition and memory normal.        Judgment: Judgment normal.     Comments: Insight intact     Lab Review:     Component Value Date/Time   NA 141 01/15/2020 1150   K 3.2 (L) 01/15/2020 1150   CL 103 01/15/2020 1150   CO2 23 01/05/2020 1014   GLUCOSE 139 (H) 01/15/2020 1150   BUN 16 01/15/2020 1150   CREATININE 1.10 01/15/2020 1150   CALCIUM 9.2 01/05/2020 1014   PROT 6.7 01/05/2020 1014   ALBUMIN 3.9 01/05/2020 1014   AST 24 01/05/2020 1014   ALT 33 01/05/2020 1014   ALKPHOS 54 01/05/2020 1014   BILITOT 0.8 01/05/2020 1014   GFRNONAA 52 (L) 01/05/2020 1014   GFRAA >60 01/05/2020 1014       Component Value Date/Time   WBC 12.4 (H) 01/05/2020 1014   RBC 5.57 01/05/2020 1014   HGB 18.4 (H) 01/15/2020 1150   HCT 54.0 (H) 01/15/2020 1150   PLT 243 01/05/2020 1014   MCV 93.7 01/05/2020 1014   MCV 94.2 04/29/2013 0936   MCH 31.1 01/05/2020 1014   MCHC 33.1 01/05/2020 1014   RDW 12.2 01/05/2020 1014   LYMPHSABS 3.0 09/05/2018 0754   MONOABS 0.9 09/05/2018 0754   EOSABS 0.3 09/05/2018 0754   BASOSABS 0.1 09/05/2018 0754     No results found for: POCLITH, LITHIUM   No results found for: PHENYTOIN, PHENOBARB, VALPROATE, CBMZ   .res Assessment: Plan:   Continue Trintellix 5 mg daily for mood, anxiety, and to improve cognitive processing speed since patient reports that Trintellix has been helpful for target signs and symptoms and is well-tolerated at the 5 mg dose. Patient to follow-up in 3 months or sooner if clinically indicated. Patient advised to contact office with any questions, adverse effects, or acute worsening in signs and symptoms.  Aarush was seen today for follow-up.  Diagnoses and all orders for this visit:  Recurrent major depressive disorder, in full remission ([place]) -     vortioxetine HBr (TRINTELLIX) 5 MG TABS tablet; Take 1 tablet (5 mg total) by mouth daily.  Attention deficit hyperactivity disorder (ADHD), combined type     Please see After Visit Summary for patient specific instructions.  Future Appointments  Date Time Provider [place]  03/26/2020  9:00 AM Cottle, Lucious Groves, LCSW [facility] None  04/17/2020  9:00 AM Cottle, Bambi G, LCSW [facility] None  06/29/2020 11:00 AM Thayer Headings, PMHNP [facility] None    No orders of the defined types were placed in this encounter.   -------------------------------

## 2020-03-26 ENCOUNTER — Ambulatory Visit (INDEPENDENT_AMBULATORY_CARE_PROVIDER_SITE_OTHER): Payer: BC Managed Care – PPO | Admitting: Psychology

## 2020-03-26 ENCOUNTER — Other Ambulatory Visit: Payer: Self-pay | Admitting: Internal Medicine

## 2020-03-26 DIAGNOSIS — F900 Attention-deficit hyperactivity disorder, predominantly inattentive type: Secondary | ICD-10-CM | POA: Diagnosis not present

## 2020-03-26 DIAGNOSIS — F330 Major depressive disorder, recurrent, mild: Secondary | ICD-10-CM | POA: Diagnosis not present

## 2020-03-26 DIAGNOSIS — F4323 Adjustment disorder with mixed anxiety and depressed mood: Secondary | ICD-10-CM

## 2020-04-17 ENCOUNTER — Ambulatory Visit: Payer: BC Managed Care – PPO | Admitting: Psychology

## 2020-04-17 ENCOUNTER — Ambulatory Visit (INDEPENDENT_AMBULATORY_CARE_PROVIDER_SITE_OTHER): Payer: BC Managed Care – PPO | Admitting: Psychology

## 2020-04-17 DIAGNOSIS — F4323 Adjustment disorder with mixed anxiety and depressed mood: Secondary | ICD-10-CM

## 2020-04-17 DIAGNOSIS — F330 Major depressive disorder, recurrent, mild: Secondary | ICD-10-CM

## 2020-04-17 DIAGNOSIS — F900 Attention-deficit hyperactivity disorder, predominantly inattentive type: Secondary | ICD-10-CM | POA: Diagnosis not present

## 2020-05-01 ENCOUNTER — Ambulatory Visit (INDEPENDENT_AMBULATORY_CARE_PROVIDER_SITE_OTHER): Payer: BC Managed Care – PPO | Admitting: Psychology

## 2020-05-01 DIAGNOSIS — F330 Major depressive disorder, recurrent, mild: Secondary | ICD-10-CM

## 2020-05-01 DIAGNOSIS — F4324 Adjustment disorder with disturbance of conduct: Secondary | ICD-10-CM | POA: Diagnosis not present

## 2020-05-01 DIAGNOSIS — F900 Attention-deficit hyperactivity disorder, predominantly inattentive type: Secondary | ICD-10-CM | POA: Diagnosis not present

## 2020-05-13 ENCOUNTER — Ambulatory Visit (INDEPENDENT_AMBULATORY_CARE_PROVIDER_SITE_OTHER): Payer: BC Managed Care – PPO | Admitting: Psychology

## 2020-05-13 DIAGNOSIS — F900 Attention-deficit hyperactivity disorder, predominantly inattentive type: Secondary | ICD-10-CM

## 2020-05-13 DIAGNOSIS — F330 Major depressive disorder, recurrent, mild: Secondary | ICD-10-CM | POA: Diagnosis not present

## 2020-05-13 DIAGNOSIS — F4323 Adjustment disorder with mixed anxiety and depressed mood: Secondary | ICD-10-CM | POA: Diagnosis not present

## 2020-05-15 ENCOUNTER — Other Ambulatory Visit: Payer: Self-pay

## 2020-05-15 ENCOUNTER — Encounter: Payer: Self-pay | Admitting: Family Medicine

## 2020-05-15 ENCOUNTER — Ambulatory Visit: Payer: BC Managed Care – PPO | Admitting: Family Medicine

## 2020-05-15 VITALS — BP 130/76 | HR 83 | Temp 97.6°F | Ht 68.0 in | Wt 214.0 lb

## 2020-05-15 DIAGNOSIS — W57XXXA Bitten or stung by nonvenomous insect and other nonvenomous arthropods, initial encounter: Secondary | ICD-10-CM | POA: Diagnosis not present

## 2020-05-15 DIAGNOSIS — S80862A Insect bite (nonvenomous), left lower leg, initial encounter: Secondary | ICD-10-CM | POA: Diagnosis not present

## 2020-05-15 MED ORDER — DOXYCYCLINE HYCLATE 100 MG PO TABS: 100.0000 mg | ORAL_TABLET | Freq: Two times a day (BID) | ORAL | 0 refills | Status: AC

## 2020-05-15 NOTE — Patient Instructions (Signed)
Tick Bite Information, Adult  Ticks are insects that draw blood for food. Most ticks live in shrubs and grassy areas. They climb onto people and animals that brush against the leaves and grasses that they rest on. Then they bite, attaching themselves to the skin. Most ticks are harmless, but some ticks carry germs that can spread to a person through a bite and cause a disease. To reduce your risk of getting a disease from a tick bite, it is important to take steps to prevent tick bites. It is also important to check for ticks after being outdoors. If you find that a tick has attached to you, watch for symptoms of disease. How can I prevent tick bites? Take these steps to help prevent tick bites when you are outdoors in an area where ticks are found:  Use insect repellent that has DEET (20% or higher), picaridin, or IR3535 in it. Use it on: ? Skin that is showing. ? The top of your boots. ? Your pant legs. ? Your sleeve cuffs.  For repellent products that contain permethrin, follow product instructions. Use these products on: ? Clothing. ? Gear. ? Boots. ? Tents.  Wear protective clothing. Long sleeves and long pants offer the best protection from ticks.  Wear light-colored clothing so you can see ticks more easily.  Tuck your pant legs into your socks.  If you go walking on a trail, stay in the middle of the trail so your skin, hair, and clothing do not touch the bushes.  Avoid walking through areas with long grass.  Check for ticks on your clothing, hair, and skin often while you are outside, and check again before you go inside. Make sure to check the places that ticks attach themselves most often. These places include the scalp, neck, armpits, waist, groin, and joint areas. Ticks that carry a disease called Lyme disease have to be attached to the skin for 24-48 hours. Checking for ticks every day will lessen your risk of this and other diseases.  When you come indoors, wash your  clothes and take a shower or a bath right away. Dry your clothes in a dryer on high heat for at least 60 minutes. This will kill any ticks in your clothes. What is the proper way to remove a tick? If you find a tick on your body, remove it as soon as possible. Removing a tick sooner rather than later can prevent germs from passing from the tick to your body. To remove a tick that is crawling on your skin but has not bitten:  Go outdoors and brush the tick off.  Remove the tick with tape or a lint roller. To remove a tick that is attached to your skin:  Wash your hands.  If you have latex gloves, put them on.  Use tweezers, curved forceps, or a tick-removal tool to gently grasp the tick as close to your skin and the tick's head as possible.  Gently pull with steady, upward pressure until the tick lets go. When removing the tick: ? Take care to keep the tick's head attached to its body. ? Do not twist or jerk the tick. This can make the tick's head or mouth break off. ? Do not squeeze or crush the tick's body. This could force disease-carrying fluids from the tick into your body. Do not try to remove a tick with heat, alcohol, petroleum jelly, or fingernail polish. Using these methods can cause the tick to salivate and regurgitate into   your bloodstream, increasing your risk of getting a disease. What should I do after removing a tick?  Clean the bite area with soap and water, rubbing alcohol, or an iodine scrub.  If an antiseptic cream or ointment is available, apply a small amount to the bite site.  Wash and disinfect any instruments that you used to remove the tick. How should I dispose of a tick? To dispose of a live tick, use one of these methods:  Place it in rubbing alcohol.  Place it in a sealed bag or container.  Wrap it tightly in tape.  Flush it down the toilet. Contact a health care provider if:  You have symptoms of a disease after a tick bite. Symptoms of a  tick-borne disease can occur from moments after the tick bites to up to 30 days after a tick is removed. Symptoms include: ? Muscle, joint, or bone pain. ? Difficulty walking or moving your legs. ? Numbness in the legs. ? Paralysis. ? Red rash around the tick bite area that is shaped like a target or a "bull's-eye." ? Redness and swelling in the area of the tick bite. ? Fever. ? Repeated vomiting. ? Diarrhea. ? Weight loss. ? Tender, swollen lymph glands. ? Shortness of breath. ? Cough. ? Pain in the abdomen. ? Headache. ? Abnormal tiredness. ? A change in your level of consciousness. ? Confusion. Get help right away if:  You are not able to remove a tick.  A part of a tick breaks off and gets stuck in your skin.  Your symptoms get worse. Summary  Ticks may carry germs that can spread to a person through a bite and cause disease.  Wear protective clothing and use insect repellent to prevent tick bites. Follow product instructions.  If you find a tick on your body, remove it as soon as possible. If the tick is attached, do not try to remove with heat, alcohol, petroleum jelly, or fingernail polish.  Remove the attached tick using tweezers, curved forceps, or a tick-removal tool. Gently pull with steady, upward pressure until the tick lets go. Do not twist or jerk the tick. Do not squeeze or crush the tick's body.  If you have symptoms after being bitten by a tick, contact a health care provider. This information is not intended to replace advice given to you by your health care provider. Make sure you discuss any questions you have with your health care provider. Document Revised: 11/03/2017 Document Reviewed: 09/02/2016 Elsevier Patient Education  [place].  Lyme Disease Lyme disease is an infection that can affect many parts of the body, including the skin, joints, and nervous system. It is a bacterial infection that starts from the bite of an infected tick. Over  time, the infection can worsen, and some of the symptoms are similar to the flu. If Lyme disease is not treated, it may cause joint pain, swelling, numbness, problems thinking, fatigue, muscle weakness, and other problems. What are the causes? This condition is caused by bacteria called Borrelia burgdorferi.  You can get Lyme disease by being bitten by an infected tick.  Only black-legged, or Ixodes, ticks that are infected with the bacteria can cause Lyme disease.  The tick must be attached to your skin for a certain period of time to pass along the infection. This is usually 36-48 hours.  Deer often carry infected ticks. What increases the risk? The following factors may make you more likely to develop this condition:  Living in or visiting these areas in the [place] ? [place]. ? The [place]. ? The [place].  Spending time in wooded or grassy areas.  Being outdoors with exposed skin.  Camping, gardening, hiking, fishing, hunting, or working outdoors.  Failing to remove a tick from your skin. What are the signs or symptoms? Symptoms of this condition may include:  Chills and fever.  Headache.  Fatigue.  General achiness.  Muscle pain.  Joint pain, often in the knees.  A round, red rash that surrounds the center of the tick bite. The center of the rash may be blood colored or have tiny blisters.  Swollen lymph glands.  Stiff neck. How is this diagnosed? This condition is diagnosed based on:  Your symptoms and medical history.  A physical exam.  A blood test. How is this treated? The main treatment for this condition is antibiotic medicine, which is usually taken by mouth (orally).  The length of treatment depends on how soon after a tick bite you begin taking the medicine. In some cases, treatment is necessary for several weeks.  If the infection is severe, antibiotics may need to be given through an IV that is inserted into one of your  veins. Follow these instructions at home:  Take over-the-counter and prescription medicines only as told by your health care provider. Finish all antibiotic medicine, even when you start to feel better.  Ask your health care provider about taking a probiotic in between doses of your antibiotic to help avoid an upset stomach or diarrhea.  Check with your health care provider before supplementing your treatment. Many alternative therapies have not been proven and may be harmful to you.  Keep all follow-up visits as told by your health care provider. This is important. How is this prevented? You can become reinfected if you get another tick bite from an infected tick. Take these steps to help prevent an infection:  Cover your skin with light-colored clothing when you are outdoors in the spring and summer months.  Spray clothing and skin with bug spray. The spray should be 20-30% DEET. You can also treat clothing with permethrin, and let it dry before you wear it. Do not apply permethrin directly to your skin. Permethrin can also be used to treat camping gear and boots. Always read and follow the instructions that come with a bug spray or insecticide.  Avoid wooded, grassy, and shaded areas.  Remove yard litter, brush, trash, and plants that attract deer and rodents.  Check yourself for ticks when you come indoors.  Wash clothing worn each day.  Shower after spending time outdoors.  Check your pets for ticks before they come inside.  If you find a tick attached to your skin: ? Remove it with tweezers. ? Clean your hands and the bite area with rubbing alcohol or soap and water. ? Dispose of the tick by putting it in rubbing alcohol, putting it in a sealed bag or container, or flushing it down the toilet. ? You may choose to save the tick in a sealed container if you wish for it to be tested at a later time. Pregnant women should take special care to avoid tick bites because it is  possible that the infection may be passed along to the fetus. Contact a health care provider if:  You have symptoms after treatment.  You have removed a tick and want to bring it to your health care provider for testing. Get help right away  if:  You have an irregular heartbeat.  You have chest pain.  You have nerve pain.  Your face feels numb.  You develop the following: ? A stiff neck. ? A severe headache. ? Severe nausea and vomiting. ? Sensitivity to light. Summary  Lyme disease is an infection that can affect many parts of the body, including the skin, joints, and nervous system.  This condition is caused by bacteria called Borrelia burgdorferi.  You can get Lyme disease by being bitten by an infected tick.  The main treatment for this condition is antibiotic medicine. This information is not intended to replace advice given to you by your health care provider. Make sure you discuss any questions you have with your health care provider. Document Revised: 03/15/2019 Document Reviewed: 02/07/2019 Elsevier Patient Education  [place].

## 2020-05-15 NOTE — Progress Notes (Signed)
Subjective:    Patient ID: Jonathan [name], male    DOB: [birth year], 59 y.o.   MRN: [id number]  No chief complaint on file.   HPI Patient was seen today for acute concern.  Pt with tick bite. States was outside and brushed several ticks off his pants and one on his leg.  Noticed erythematous rash on posterior L calf this am.  Pt dug out dark material from the center of the lesion.  Pt unsure if it was the head of the tick.  Pt denies pruritis, fever, chills, n/v, fatigue, or joint pain.  Past Medical History:  Diagnosis Date  . Allergic rhinitis   . Anxiety   . Depression   . Diabetes mellitus without complication ([place])    type 2  . History of kidney stones   . History of nephrolithiasis   . Hyperlipidemia   . Hypertension   . Sebaceous cyst     Allergies  Allergen Reactions  . Zocor [Simvastatin]     ? myalgias  . Penicillins Hives    Per pt - happened in childhood - hives possible reaction    ROS General: Denies fever, chills, night sweats, changes in weight, changes in appetite HEENT: Denies headaches, ear pain, changes in vision, rhinorrhea, sore throat CV: Denies CP, palpitations, SOB, orthopnea Pulm: Denies SOB, cough, wheezing GI: Denies abdominal pain, nausea, vomiting, diarrhea, constipation GU: Denies dysuria, hematuria, frequency, vaginal discharge Msk: Denies muscle cramps, joint pains Neuro: Denies weakness, numbness, tingling Skin: Denies rashes, bruising  +rash on L posterior calf Psych: Denies depression, anxiety, hallucinations    Objective:    Blood pressure 130/76, pulse 83, temperature 97.6 F (36.4 C), temperature source Other (Comment), height 5\' 8"  (1.727 m), weight 214 lb (97.1 kg), SpO2 94 %.   Gen. Pleasant, well-nourished, in no distress, normal affect  HEENT: [place]/AT, face symmetric, conjunctiva clear, no scleral icterus, PERRLA, EOMI, nares patent without drainage Lungs: no accessory muscle use, CTAB, no wheezes or rales Cardiovascular:  RRR, no peripheral edema Neuro:  A&Ox3, CN II-XII intact, normal gait Skin:  Warm, dry.  L posterior calf with a large lesion comprised of small erythematous papules in a concentric distribution with a central punctum.      Wt Readings from Last 3 Encounters:  01/15/20 214 lb 6.4 oz (97.3 kg)  10/01/18 213 lb 12.8 oz (97 kg)  08/15/18 215 lb 1.6 oz (97.6 kg)    Lab Results  Component Value Date   WBC 12.4 (H) 01/05/2020   HGB 18.4 (H) 01/15/2020   HCT 54.0 (H) 01/15/2020   PLT 243 01/05/2020   GLUCOSE 139 (H) 01/15/2020   CHOL 222 (H) 01/04/2019   TRIG 157.0 (H) 01/04/2019   HDL 42.50 01/04/2019   LDLDIRECT 190.2 12/11/2013   LDLCALC 148 (H) 01/04/2019   ALT 33 01/05/2020   AST 24 01/05/2020   NA 141 01/15/2020   K 3.2 (L) 01/15/2020   CL 103 01/15/2020   CREATININE 1.10 01/15/2020   BUN 16 01/15/2020   CO2 23 01/05/2020   TSH 0.78 05/24/2017   PSA 0.84 01/04/2019   HGBA1C 6.6 (H) 01/04/2019   MICROALBUR 0.9 01/04/2019    Assessment/Plan:  Tick bite, initial encounter  -discussed possible tickborne illnesses -We will start doxycycline twice daily x14 days -Given precautions -Given handouts - Plan: doxycycline (VIBRA-TABS) 100 MG tablet  F/u prn  Grier Mitts, MD

## 2020-05-27 ENCOUNTER — Ambulatory Visit (INDEPENDENT_AMBULATORY_CARE_PROVIDER_SITE_OTHER): Payer: BC Managed Care – PPO | Admitting: Psychology

## 2020-05-27 DIAGNOSIS — F900 Attention-deficit hyperactivity disorder, predominantly inattentive type: Secondary | ICD-10-CM

## 2020-05-27 DIAGNOSIS — F330 Major depressive disorder, recurrent, mild: Secondary | ICD-10-CM | POA: Diagnosis not present

## 2020-05-27 DIAGNOSIS — F4323 Adjustment disorder with mixed anxiety and depressed mood: Secondary | ICD-10-CM

## 2020-06-15 ENCOUNTER — Other Ambulatory Visit: Payer: Self-pay | Admitting: Psychiatry

## 2020-06-15 DIAGNOSIS — F3342 Major depressive disorder, recurrent, in full remission: Secondary | ICD-10-CM

## 2020-06-16 ENCOUNTER — Ambulatory Visit (INDEPENDENT_AMBULATORY_CARE_PROVIDER_SITE_OTHER): Payer: BC Managed Care – PPO | Admitting: Psychology

## 2020-06-16 DIAGNOSIS — F330 Major depressive disorder, recurrent, mild: Secondary | ICD-10-CM | POA: Diagnosis not present

## 2020-06-16 DIAGNOSIS — F900 Attention-deficit hyperactivity disorder, predominantly inattentive type: Secondary | ICD-10-CM

## 2020-06-16 DIAGNOSIS — F4323 Adjustment disorder with mixed anxiety and depressed mood: Secondary | ICD-10-CM

## 2020-06-29 ENCOUNTER — Encounter: Payer: Self-pay | Admitting: Psychiatry

## 2020-06-29 ENCOUNTER — Other Ambulatory Visit: Payer: Self-pay

## 2020-06-29 ENCOUNTER — Ambulatory Visit (INDEPENDENT_AMBULATORY_CARE_PROVIDER_SITE_OTHER): Payer: BC Managed Care – PPO | Admitting: Psychiatry

## 2020-06-29 VITALS — BP 162/88 | HR 74

## 2020-06-29 DIAGNOSIS — F3342 Major depressive disorder, recurrent, in full remission: Secondary | ICD-10-CM | POA: Diagnosis not present

## 2020-06-29 DIAGNOSIS — F902 Attention-deficit hyperactivity disorder, combined type: Secondary | ICD-10-CM

## 2020-06-29 MED ORDER — VORTIOXETINE HBR 5 MG PO TABS: 5.0000 mg | ORAL_TABLET | Freq: Every day | ORAL | 1 refills | Status: DC

## 2020-06-29 NOTE — Progress Notes (Signed)
Jonathan [name] [id number] 23-Oct-1961 59 y.o.  Subjective:   Patient ID:  Jonathan [name] is a 59 y.o. (DOB [birth year]) male.  Chief Complaint:  Chief Complaint  Patient presents with  . Follow-up    h/o Depression, Anxiety, and difficulty with concentration    HPI Jonathan [name] presents to the office today for follow-up of depression, anxiety, and difficulty with concentration. He reports that he is "doing well... more calm, more focus" compared to last year. He reports that mood continues to be stable. He reports that he has been able to focus and do the planning that he needs to do at home and work. Reports that he has been able to stay on top of tasks and it no longer feels overwhelming. He reports that he feels focused, "even emotionally." He reports that he has been able to remain calm during transitions at work and other situations with uncertainty. Denies anxiety. He reports that he has been sleeping very well. Reports that Fit Bit indicates that his sleep quality is very good with deep sleep and and adequate amount of sleep. He notices that he has to push himself to get out of bed and then energy has been good throughout the day. Motivation has been good. Notices slight decrease in appetite. Denies SI.   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 Feels that he has been better able to support those around him.    Past Psychiatric Medication Trials: Citalopram- Sexual side effects, fatigue. Partially effective for depression. Increased to 15 mg in the last month and noticed more fatigue. He reports that he has been taking 10 mg most of the time since then. Trintellix- Improved mood, anxiety, and focus on 5 mg. Excessive somnolence on 10 mg. Wellbutrin- Took 25 years ago.  PHQ2-9     Telemedicine from 08/02/2019 in [place] at [facility] from 10/01/2018 in [place] at [facility] from  05/24/2017 in [place] at [facility] Total Score 4 0 0  PHQ-9 Total Score 15 -- --       Review of Systems:  Review of Systems  Gastrointestinal: Negative.   Musculoskeletal: Negative for gait problem.  Neurological: Negative for tremors.  Psychiatric/Behavioral:       Please refer to HPI  reports that he has been monitoring glucose levels more frequently and reports good glycemic control.   Medications: I have reviewed the patient's current medications.  Current Outpatient Medications  Medication Sig Dispense Refill  . loratadine (CLARITIN) 10 MG tablet Take 10 mg by mouth every other day.    . losartan-hydrochlorothiazide (HYZAAR) 50-12.5 MG tablet TAKE 2 TABLETS BY MOUTH EVERY DAY (Patient taking differently: Take 2 tablets by mouth daily. ) 180 tablet 3  . metFORMIN (GLUCOPHAGE-XR) 500 MG 24 hr tablet Take 1 tablet (500 mg total) by mouth 2 (two) times daily. Please schedule follow up for further refills. 60 tablet 0  . Probiotic Product (PROBIOTIC PO) Take 1 tablet by mouth every other day.    . vortioxetine HBr (TRINTELLIX) 5 MG TABS tablet Take 1 tablet (5 mg total) by mouth daily. 90 tablet 1   No current facility-administered medications for this visit.    Medication Side Effects: Other: Some slight decrease in libido  Allergies:  Allergies  Allergen Reactions  . Zocor [Simvastatin]     ? myalgias  . Penicillins Hives    Per pt - happened in childhood -  hives possible reaction    Past Medical History:  Diagnosis Date  . Allergic rhinitis   . Anxiety   . Depression   . Diabetes mellitus without complication ([place])    type 2  . History of kidney stones   . History of nephrolithiasis   . Hyperlipidemia   . Hypertension   . Sebaceous cyst     Family History  Problem Relation Age of Onset  . Diabetes Father   . Hypertension Father   . Kidney cancer Father        now on dialysis  . Kidney Stones Father   . Kidney Stones Brother   .  Huntington's disease Maternal Grandfather   . Depression Daughter   . Anxiety disorder Daughter   . ADD / ADHD Daughter     Social History   Socioeconomic History  . Marital status: Married    Spouse name: Not on file  . Number of children: 1  . Years of education: Not on file  . Highest education level: Not on file  Occupational History  . Occupation: MINISTER    Employer: [employer]  Tobacco Use  . Smoking status: Never Smoker  . Smokeless tobacco: Never Used  Vaping Use  . Vaping Use: Never used  Substance and Sexual Activity  . Alcohol use: Yes    Comment: less than 1 a week  . Drug use: No  . Sexual activity: Yes  Other Topics Concern  . Not on file  Social History Narrative   Married   Regular exercise- no   HH of 3   Dog car and lizard   Sleep wakening at times   New job [employer]   Per week reg sleep.    Social Determinants of Health   Financial Resource Strain:   . Difficulty of Paying Living Expenses:   Food Insecurity:   . Worried About Charity fundraiser in the Last Year:   . Arboriculturist in the Last Year:   Transportation Needs:   . Film/video editor (Medical):   Marland Kitchen Lack of Transportation (Non-Medical):   Physical Activity:   . Days of Exercise per Week:   . Minutes of Exercise per Session:   Stress:   . Feeling of Stress :   Social Connections:   . Frequency of Communication with Friends and Family:   . Frequency of Social Gatherings with Friends and Family:   . Attends Religious Services:   . Active Member of Clubs or Organizations:   . Attends Archivist Meetings:   Marland Kitchen Marital Status:   Intimate Partner Violence:   . Fear of Current or Ex-Partner:   . Emotionally Abused:   Marland Kitchen Physically Abused:   . Sexually Abused:     Past Medical History, Surgical history, Social history, and Family history were reviewed and updated as appropriate.   Please see review of systems for further details on the patient's  review from today.   Objective:   Physical Exam:  BP (!) 162/88   Pulse 74   Physical Exam Constitutional:      General: He is not in acute distress. Musculoskeletal:        General: No deformity.  Neurological:     Mental Status: He is alert and oriented to person, place, and time.     Coordination: Coordination normal.  Psychiatric:        Attention and Perception: Attention and perception normal. He does not perceive  auditory or visual hallucinations.        Mood and Affect: Mood normal. Mood is not anxious or depressed. Affect is not labile, blunt, angry or inappropriate.        Speech: Speech normal.        Behavior: Behavior normal.        Thought Content: Thought content normal. Thought content is not paranoid or delusional. Thought content does not include homicidal or suicidal ideation. Thought content does not include homicidal or suicidal plan.        Cognition and Memory: Cognition and memory normal.        Judgment: Judgment normal.     Comments: Insight intact     Lab Review:     Component Value Date/Time   NA 141 01/15/2020 1150   K 3.2 (L) 01/15/2020 1150   CL 103 01/15/2020 1150   CO2 23 01/05/2020 1014   GLUCOSE 139 (H) 01/15/2020 1150   BUN 16 01/15/2020 1150   CREATININE 1.10 01/15/2020 1150   CALCIUM 9.2 01/05/2020 1014   PROT 6.7 01/05/2020 1014   ALBUMIN 3.9 01/05/2020 1014   AST 24 01/05/2020 1014   ALT 33 01/05/2020 1014   ALKPHOS 54 01/05/2020 1014   BILITOT 0.8 01/05/2020 1014   GFRNONAA 52 (L) 01/05/2020 1014   GFRAA >60 01/05/2020 1014       Component Value Date/Time   WBC 12.4 (H) 01/05/2020 1014   RBC 5.57 01/05/2020 1014   HGB 18.4 (H) 01/15/2020 1150   HCT 54.0 (H) 01/15/2020 1150   PLT 243 01/05/2020 1014   MCV 93.7 01/05/2020 1014   MCV 94.2 04/29/2013 0936   MCH 31.1 01/05/2020 1014   MCHC 33.1 01/05/2020 1014   RDW 12.2 01/05/2020 1014   LYMPHSABS 3.0 09/05/2018 0754   MONOABS 0.9 09/05/2018 0754   EOSABS 0.3  09/05/2018 0754   BASOSABS 0.1 09/05/2018 0754    No results found for: POCLITH, LITHIUM   No results found for: PHENYTOIN, PHENOBARB, VALPROATE, CBMZ   .res Assessment: Plan:   Continue Trintellix 5 mg po qd for mood and cognitive processing speed. Recommend continuing to see Bambi Cottle, LCSW. Pt to f/u in 6 months or sooner if clinically indicated.  Patient advised to contact office with any questions, adverse effects, or acute worsening in signs and symptoms.  Derrall was seen today for follow-up.  Diagnoses and all orders for this visit:  Recurrent major depressive disorder, in full remission ([place]) -     vortioxetine HBr (TRINTELLIX) 5 MG TABS tablet; Take 1 tablet (5 mg total) by mouth daily.  Attention deficit hyperactivity disorder (ADHD), combined type     Please see After Visit Summary for patient specific instructions.  Future Appointments  Date Time Provider [place]  07/08/2020  9:00 AM Cottle, Lucious Groves, LCSW [facility] None  07/29/2020  9:00 AM Cottle, Bambi G, LCSW [facility] None  12/30/2020  1:30 PM [facility] Headings, PMHNP [facility] None    No orders of the defined types were placed in this encounter.   -------------------------------

## 2020-07-08 ENCOUNTER — Ambulatory Visit (INDEPENDENT_AMBULATORY_CARE_PROVIDER_SITE_OTHER): Payer: BC Managed Care – PPO | Admitting: Psychology

## 2020-07-08 DIAGNOSIS — F330 Major depressive disorder, recurrent, mild: Secondary | ICD-10-CM | POA: Diagnosis not present

## 2020-07-08 DIAGNOSIS — F4323 Adjustment disorder with mixed anxiety and depressed mood: Secondary | ICD-10-CM | POA: Diagnosis not present

## 2020-07-08 DIAGNOSIS — F900 Attention-deficit hyperactivity disorder, predominantly inattentive type: Secondary | ICD-10-CM | POA: Diagnosis not present

## 2020-07-10 DIAGNOSIS — Z20822 Contact with and (suspected) exposure to covid-19: Secondary | ICD-10-CM | POA: Diagnosis not present

## 2020-07-29 ENCOUNTER — Ambulatory Visit (INDEPENDENT_AMBULATORY_CARE_PROVIDER_SITE_OTHER): Payer: BC Managed Care – PPO | Admitting: Psychology

## 2020-07-29 DIAGNOSIS — F900 Attention-deficit hyperactivity disorder, predominantly inattentive type: Secondary | ICD-10-CM | POA: Diagnosis not present

## 2020-07-29 DIAGNOSIS — F330 Major depressive disorder, recurrent, mild: Secondary | ICD-10-CM

## 2020-07-29 DIAGNOSIS — F4323 Adjustment disorder with mixed anxiety and depressed mood: Secondary | ICD-10-CM

## 2020-09-02 ENCOUNTER — Ambulatory Visit (INDEPENDENT_AMBULATORY_CARE_PROVIDER_SITE_OTHER): Payer: BC Managed Care – PPO | Admitting: Psychology

## 2020-09-02 DIAGNOSIS — F900 Attention-deficit hyperactivity disorder, predominantly inattentive type: Secondary | ICD-10-CM | POA: Diagnosis not present

## 2020-09-02 DIAGNOSIS — F330 Major depressive disorder, recurrent, mild: Secondary | ICD-10-CM | POA: Diagnosis not present

## 2020-09-15 ENCOUNTER — Ambulatory Visit (INDEPENDENT_AMBULATORY_CARE_PROVIDER_SITE_OTHER): Payer: BC Managed Care – PPO | Admitting: Psychiatry

## 2020-09-15 ENCOUNTER — Encounter: Payer: Self-pay | Admitting: Psychiatry

## 2020-09-15 ENCOUNTER — Other Ambulatory Visit: Payer: Self-pay

## 2020-09-15 DIAGNOSIS — F3342 Major depressive disorder, recurrent, in full remission: Secondary | ICD-10-CM

## 2020-09-15 MED ORDER — VORTIOXETINE HBR 5 MG PO TABS: 2.5000 mg | ORAL_TABLET | Freq: Every day | ORAL | 1 refills | Status: DC

## 2020-09-15 NOTE — Progress Notes (Signed)
Jonathan [name] [id number] [birth year] 59 y.o.  Subjective:   Patient ID:  Jonathan [name] is a 59 y.o. (DOB 05/29/1961) male.  Chief Complaint:  Chief Complaint  Patient presents with   Medication Reaction    HPI Jonathan [name] presents to the office today for follow-up of anxiety, depression, and concentration impairment. He reports that the focus he "has gotten with Trintellix is making me nervous." He notices anxiety in situations where he needs to do several things at once and has become irritable and unable to handle different things. He reports that he had one recent episode where he was uncharacteristically angered and aggressive. He reports frustration in response to feeling as if everything had to be decided right then.He reports that he has had moments of anxiety and anger and notices he is forgetful and apathetic. He reports that in a few situations he has felt that he needs to exit a situation and then feels as if he is not being responsible. He has been noticing some fatigue and difficulty getting up in the mornings over the past 2 weeks.   He denies chronic anxiety. He reports that when a lot of things are going on at once, he has increased anxiety. He reports that he has not had any recent depression other than some negative thoughts after recent episodes. He reports that "most of the time I am present, happy, attentive." Sleeping ok. He reports that he does not feel rested even when Fit Bit indicates deep, quality sleep. Low energy is persistent throughout the day. Motivation has been lower. He reports that concentration and focus have been improved. Appetite has been less. Denies SI.   He noticed that he felt better when he forgot medication one day.   Work has been busy with celebrating 50th anniversary of church and holidays approaching.   Past Psychiatric Medication Trials: Citalopram- Sexual side effects, fatigue. Partially effective for depression. Increased to 15  mg in the last month and noticed more fatigue. He reports that he has been taking 10 mg most of the time since then. Trintellix- Improved mood, anxiety, and focus on 5 mg. Excessive somnolence on 10 mg. Wellbutrin- Took 25 years ago.   PHQ2-9     Telemedicine from 08/02/2019 in [place] at [facility] from 10/01/2018 in [place] at [facility] from 05/24/2017 in [place] at [facility] Total Score 4 0 0  PHQ-9 Total Score 15 -- --       Review of Systems:  Review of Systems  Cardiovascular: Negative for palpitations.  Gastrointestinal: Negative.   Musculoskeletal: Negative for gait problem.  Neurological: Negative for tremors.  Psychiatric/Behavioral:       Please refer to HPI    Medications: I have reviewed the patient's current medications.  Current Outpatient Medications  Medication Sig Dispense Refill   loratadine (CLARITIN) 10 MG tablet Take 10 mg by mouth every other day.     losartan-hydrochlorothiazide (HYZAAR) 50-12.5 MG tablet TAKE 2 TABLETS BY MOUTH EVERY DAY (Patient taking differently: Take 2 tablets by mouth daily. ) 180 tablet 3   metFORMIN (GLUCOPHAGE-XR) 500 MG 24 hr tablet Take 1 tablet (500 mg total) by mouth 2 (two) times daily. Please schedule follow up for further refills. 60 tablet 0   Probiotic Product (PROBIOTIC PO) Take 1 tablet by mouth every other day.     vortioxetine HBr (TRINTELLIX) 5 MG TABS tablet Take 0.5 tablets (2.5 mg total) by mouth daily. 90 tablet  1   No current facility-administered medications for this visit.    Medication Side Effects: Fatigue and Other: Decreased sexual interest  Allergies:  Allergies  Allergen Reactions   Zocor [Simvastatin]     ? myalgias   Penicillins Hives    Per pt - happened in childhood - hives possible reaction    Past Medical History:  Diagnosis Date   Allergic rhinitis    Anxiety    Depression    Diabetes mellitus without  complication ([facility])    type 2   History of kidney stones    History of nephrolithiasis    Hyperlipidemia    Hypertension    Sebaceous cyst     Family History  Problem Relation Age of Onset   Diabetes Father    Hypertension Father    Kidney cancer Father        now on dialysis   Kidney Stones Father    Kidney Stones Brother    Huntington's disease Maternal Grandfather    Depression Daughter    Anxiety disorder Daughter    ADD / ADHD Daughter     Social History   Socioeconomic History   Marital status: Married    Spouse name: Not on file   Number of children: 1   Years of education: Not on file   Highest education level: Not on file  Occupational History   Occupation: MINISTER    Employer: [employer]  Tobacco Use   Smoking status: Never Smoker   Smokeless tobacco: Never Used  Vaping Use   Vaping Use: Never used  Substance and Sexual Activity   Alcohol use: Yes    Comment: less than 1 a week   Drug use: No   Sexual activity: Yes  Other Topics Concern   Not on file  Social History Narrative   Married   Regular exercise- no   HH of 3   Dog car and lizard   Sleep wakening at times   New job [employer]   Per week reg sleep.    Social Determinants of Health   Financial Resource Strain:    Difficulty of Paying Living Expenses: Not on file  Food Insecurity:    Worried About Charity fundraiser in the Last Year: Not on file   [employer] of Food in the Last Year: Not on file  Transportation Needs:    Lack of Transportation (Medical): Not on file   Lack of Transportation (Non-Medical): Not on file  Physical Activity:    Days of Exercise per Week: Not on file   Minutes of Exercise per Session: Not on file  Stress:    Feeling of Stress : Not on file  Social Connections:    Frequency of Communication with Friends and Family: Not on file   Frequency of Social Gatherings with Friends and Family: Not on file    Attends Religious Services: Not on file   Active Member of Clubs or Organizations: Not on file   Attends Archivist Meetings: Not on file   Marital Status: Not on file  Intimate Partner Violence:    Fear of Current or Ex-Partner: Not on file   Emotionally Abused: Not on file   Physically Abused: Not on file   Sexually Abused: Not on file    Past Medical History, Surgical history, Social history, and Family history were reviewed and updated as appropriate.   Please see review of systems for further details on the patient's review from  today.   Objective:   Physical Exam:  There were no vitals taken for this visit.  Physical Exam Constitutional:      General: He is not in acute distress. Musculoskeletal:        General: No deformity.  Neurological:     Mental Status: He is alert and oriented to person, place, and time.     Coordination: Coordination normal.  Psychiatric:        Attention and Perception: Attention and perception normal. He does not perceive auditory or visual hallucinations.        Mood and Affect: Mood is anxious. Mood is not depressed. Affect is not labile, blunt, angry or inappropriate.        Speech: Speech normal.        Behavior: Behavior normal.        Thought Content: Thought content normal. Thought content is not paranoid or delusional. Thought content does not include homicidal or suicidal ideation. Thought content does not include homicidal or suicidal plan.        Cognition and Memory: Cognition and memory normal.        Judgment: Judgment normal.     Comments: Insight intact     Lab Review:     Component Value Date/Time   NA 141 01/15/2020 1150   K 3.2 (L) 01/15/2020 1150   CL 103 01/15/2020 1150   CO2 23 01/05/2020 1014   GLUCOSE 139 (H) 01/15/2020 1150   BUN 16 01/15/2020 1150   CREATININE 1.10 01/15/2020 1150   CALCIUM 9.2 01/05/2020 1014   PROT 6.7 01/05/2020 1014   ALBUMIN 3.9 01/05/2020 1014   AST 24 01/05/2020  1014   ALT 33 01/05/2020 1014   ALKPHOS 54 01/05/2020 1014   BILITOT 0.8 01/05/2020 1014   GFRNONAA 52 (L) 01/05/2020 1014   GFRAA >60 01/05/2020 1014       Component Value Date/Time   WBC 12.4 (H) 01/05/2020 1014   RBC 5.57 01/05/2020 1014   HGB 18.4 (H) 01/15/2020 1150   HCT 54.0 (H) 01/15/2020 1150   PLT 243 01/05/2020 1014   MCV 93.7 01/05/2020 1014   MCV 94.2 04/29/2013 0936   MCH 31.1 01/05/2020 1014   MCHC 33.1 01/05/2020 1014   RDW 12.2 01/05/2020 1014   LYMPHSABS 3.0 09/05/2018 0754   MONOABS 0.9 09/05/2018 0754   EOSABS 0.3 09/05/2018 0754   BASOSABS 0.1 09/05/2018 0754    No results found for: POCLITH, LITHIUM   No results found for: PHENYTOIN, PHENOBARB, VALPROATE, CBMZ   .res Assessment: Plan:   Discussed several possible treatment options, to include decreasing Trintellix to potentially minimize side effects and continue to have improvement in mood and concentration.  Also discussed option to change to a different medication and/or consider augmentation with BuSpar to improve anxiety. Patient reports that he would like to try reduce dose of Trintellix before for transitioning to a different medication.  Discussed attempting to split 5 mg tablets in 1/2 to reduce to Trintellix 2-1/2 mg daily since patient reports noticing difficulty with energy and that energy was improved when he forgot to take Trintellix 1 day.  Discussed that if patient is not able to split tablets, then he could take Trintellix 5 mg every other day due to long half-life of Trintellix.  Discussed that long half-life would also cause some delay before noticing response to change in dosage. Recommend continuing psychotherapy with baby Cottle, LCSW. Patient to follow-up with this provider in 4 to 6 weeks  or sooner if clinically indicated. Patient advised to contact office with any questions, adverse effects, or acute worsening in signs and symptoms.  Maddux was seen today for medication  reaction.  Diagnoses and all orders for this visit:  Recurrent major depressive disorder, in full remission ([place]) -     vortioxetine HBr (TRINTELLIX) 5 MG TABS tablet; Take 0.5 tablets (2.5 mg total) by mouth daily.     Please see After Visit Summary for patient specific instructions.  Future Appointments  Date Time Provider [place]  09/16/2020  8:00 AM Cottle, Bambi G, LCSW [facility] None  09/24/2020  9:00 AM Cottle, Bambi G, LCSW [facility] None  10/14/2020  9:00 AM Cottle, Bambi G, LCSW [facility] None  10/19/2020 11:00 AM Panosh, Standley Brooking, MD [facility]  10/23/2020 10:15 AM [facility] Headings, PMHNP [facility] None  12/30/2020  1:30 PM [facility] Headings, PMHNP [facility] None    No orders of the defined types were placed in this encounter.   -------------------------------

## 2020-09-16 ENCOUNTER — Ambulatory Visit (INDEPENDENT_AMBULATORY_CARE_PROVIDER_SITE_OTHER): Payer: BC Managed Care – PPO | Admitting: Psychology

## 2020-09-16 DIAGNOSIS — F900 Attention-deficit hyperactivity disorder, predominantly inattentive type: Secondary | ICD-10-CM

## 2020-09-16 DIAGNOSIS — M542 Cervicalgia: Secondary | ICD-10-CM | POA: Diagnosis not present

## 2020-09-16 DIAGNOSIS — M9901 Segmental and somatic dysfunction of cervical region: Secondary | ICD-10-CM | POA: Diagnosis not present

## 2020-09-16 DIAGNOSIS — M546 Pain in thoracic spine: Secondary | ICD-10-CM | POA: Diagnosis not present

## 2020-09-16 DIAGNOSIS — M25512 Pain in left shoulder: Secondary | ICD-10-CM | POA: Diagnosis not present

## 2020-09-16 DIAGNOSIS — F330 Major depressive disorder, recurrent, mild: Secondary | ICD-10-CM | POA: Diagnosis not present

## 2020-09-20 ENCOUNTER — Other Ambulatory Visit: Payer: Self-pay | Admitting: Internal Medicine

## 2020-09-24 ENCOUNTER — Ambulatory Visit (INDEPENDENT_AMBULATORY_CARE_PROVIDER_SITE_OTHER): Payer: BC Managed Care – PPO | Admitting: Psychology

## 2020-09-24 DIAGNOSIS — F900 Attention-deficit hyperactivity disorder, predominantly inattentive type: Secondary | ICD-10-CM | POA: Diagnosis not present

## 2020-09-24 DIAGNOSIS — F4323 Adjustment disorder with mixed anxiety and depressed mood: Secondary | ICD-10-CM

## 2020-09-24 DIAGNOSIS — F330 Major depressive disorder, recurrent, mild: Secondary | ICD-10-CM

## 2020-10-14 ENCOUNTER — Ambulatory Visit (INDEPENDENT_AMBULATORY_CARE_PROVIDER_SITE_OTHER): Payer: BC Managed Care – PPO | Admitting: Psychology

## 2020-10-14 DIAGNOSIS — F900 Attention-deficit hyperactivity disorder, predominantly inattentive type: Secondary | ICD-10-CM | POA: Diagnosis not present

## 2020-10-14 DIAGNOSIS — F330 Major depressive disorder, recurrent, mild: Secondary | ICD-10-CM

## 2020-10-14 DIAGNOSIS — F4323 Adjustment disorder with mixed anxiety and depressed mood: Secondary | ICD-10-CM

## 2020-10-19 ENCOUNTER — Encounter: Payer: Self-pay | Admitting: Internal Medicine

## 2020-10-19 ENCOUNTER — Other Ambulatory Visit: Payer: Self-pay

## 2020-10-19 ENCOUNTER — Ambulatory Visit (INDEPENDENT_AMBULATORY_CARE_PROVIDER_SITE_OTHER): Payer: BC Managed Care – PPO | Admitting: Internal Medicine

## 2020-10-19 VITALS — BP 146/92 | HR 74 | Temp 98.2°F | Ht 68.0 in | Wt 213.0 lb

## 2020-10-19 DIAGNOSIS — E785 Hyperlipidemia, unspecified: Secondary | ICD-10-CM | POA: Diagnosis not present

## 2020-10-19 DIAGNOSIS — R5383 Other fatigue: Secondary | ICD-10-CM | POA: Diagnosis not present

## 2020-10-19 DIAGNOSIS — Z125 Encounter for screening for malignant neoplasm of prostate: Secondary | ICD-10-CM

## 2020-10-19 DIAGNOSIS — Z23 Encounter for immunization: Secondary | ICD-10-CM

## 2020-10-19 DIAGNOSIS — Z79899 Other long term (current) drug therapy: Secondary | ICD-10-CM

## 2020-10-19 DIAGNOSIS — I10 Essential (primary) hypertension: Secondary | ICD-10-CM | POA: Diagnosis not present

## 2020-10-19 DIAGNOSIS — Z0000 Encounter for general adult medical examination without abnormal findings: Secondary | ICD-10-CM | POA: Diagnosis not present

## 2020-10-19 DIAGNOSIS — R7303 Prediabetes: Secondary | ICD-10-CM | POA: Diagnosis not present

## 2020-10-19 DIAGNOSIS — Z1211 Encounter for screening for malignant neoplasm of colon: Secondary | ICD-10-CM

## 2020-10-19 NOTE — Patient Instructions (Addendum)
   bp today not at goal  In office     But check to see if at home and send in readings Please bring your blood pressure cuff to next appointment Take blood pressure readings twice a day for 3-5 days and record .     Take 2 -3 readings at each sitting .   Can send in readings  by My Chart.   Before checking your blood pressure make sure: You are seated and quite for 5 min before checking Feet are flat on the floor Siting in chair with your back supported straight up and down Arm resting on table or arm of chair at heart level Bladder is empty You have NOT had caffeine or tobacco within the last 30 min    Will notify you  of labs when available.   Get bmi under 69 for health reasons  See derm at some point  To check skin for  Precancers and concerns  Gt injection appt when want to to shingrix vaccine   Will refer for colonoscopy as due  .

## 2020-10-19 NOTE — Progress Notes (Signed)
Chief Complaint  Patient presents with  . Annual Exam    having fatigue wants labs drawn  . Medication Management    HPI: Patient  Jonathan [name]  60 y.o. comes in today for Preventive Health Care visit   Bp:   125 range at home  weekly check  And taking meds  Not today .   BG on ,metformin  Often misses second dose  And checking   Mostly 80% or in range  or so   Mood   On trintellix  Depressive sx in remission helping moods and larity  10 mg was   Tired and then 5 mg  For a while  And then   Dec  dose  Qod .  2.5   Health Maintenance  Topic Date Due  . PNEUMOCOCCAL POLYSACCHARIDE VACCINE AGE 1-64 HIGH RISK  Never done  . OPHTHALMOLOGY EXAM  Never done  . COVID-19 Vaccine (1) Never done  . HIV Screening  Never done  . HEMOGLOBIN A1C  07/05/2019  . TETANUS/TDAP  03/31/2020  . COLONOSCOPY  07/05/2020  . FOOT EXAM  10/19/2021  . INFLUENZA VACCINE  Completed  . Hepatitis C Screening  Completed   Health Maintenance Review LIFESTYLE:  Exercise:   Walk  As much .  Tobacco/ETS: n Alcohol:  rare Sugar beverages:   Not at all  Sleep:  Fit bit    6+    Harder to stay alseep. Drug use: no HH of   2    Wife has depressive sx.   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 GI /GU: No adominal pain, vomiting, change in bowel habits. No blood in the stool. No significant GU symptoms. SKIN/HEME: ,no acute skin rashes suspicious lesions or bleeding. No lymphadenopathy, nodules, masses.  NEURO/ PSYCH:  No neurologic signs such as weakness numbness. No depression anxiety. IMM/ Allergy: No unusual infections.  Allergy .   REST of 12 system review negative except as per HPI   Past Medical History:  Diagnosis  Date  . Allergic rhinitis   . Anxiety   . Depression   . Diabetes mellitus without complication ([place])    type 2  . History of kidney stones   . History of nephrolithiasis   . Hyperlipidemia   . Hypertension   . Sebaceous cyst     Past Surgical History:  Procedure Laterality Date  . CYSTOSCOPY/URETEROSCOPY/HOLMIUM LASER/STENT PLACEMENT Left 01/15/2020   Procedure: CYSTOSCOPY/URETEROSCOPY/HOLMIUM LASER/STENT PLACEMENT;  Surgeon: Ceasar Mons, MD;  Location: [facility];  Service: Urology;  Laterality: Left;  . kidney stones removed  cystoscopy   x 2  . TONSILLECTOMY     x 2  . TYMPANOSTOMY TUBE PLACEMENT  as child    Family History  Problem Relation Age of Onset  . Diabetes Father   . Hypertension Father   . Kidney cancer Father        now on dialysis  . Kidney Stones Father   . Kidney Stones Brother   . Huntington's disease Maternal Grandfather   . Depression Daughter   . Anxiety disorder Daughter   . ADD / ADHD Daughter     Social History  Socioeconomic History  . Marital status: Married    Spouse name: Not on file  . Number of children: 1  . Years of education: Not on file  . Highest education level: Not on file  Occupational History  . Occupation: MINISTER    Employer: [employer]  Tobacco Use  . Smoking status: Never Smoker  . Smokeless tobacco: Never Used  Vaping Use  . Vaping Use: Never used  Substance and Sexual Activity  . Alcohol use: Yes    Comment: less than 1 a week  . Drug use: No  . Sexual activity: Yes  Other Topics Concern  . Not on file  Social History Narrative   Married   Regular exercise- no   HH of 3   Dog car and lizard   Sleep wakening at times   New job [employer]   Per week reg sleep.    Social Determinants of Health   Financial Resource Strain:   . Difficulty of Paying Living Expenses: Not on file  Food Insecurity:   . Worried About Charity fundraiser in the Last Year: Not on  file  . Ran Out of Food in the Last Year: Not on file  Transportation Needs:   . Lack of Transportation (Medical): Not on file  . Lack of Transportation (Non-Medical): Not on file  Physical Activity:   . Days of Exercise per Week: Not on file  . Minutes of Exercise per Session: Not on file  Stress:   . Feeling of Stress : Not on file  Social Connections:   . Frequency of Communication with Friends and Family: Not on file  . Frequency of Social Gatherings with Friends and Family: Not on file  . Attends Religious Services: Not on file  . Active Member of Clubs or Organizations: Not on file  . Attends Archivist Meetings: Not on file  . Marital Status: Not on file    Outpatient Medications Prior to Visit  Medication Sig Dispense Refill  . loratadine (CLARITIN) 10 MG tablet Take 10 mg by mouth every other day.    . losartan-hydrochlorothiazide (HYZAAR) 50-12.5 MG tablet TAKE 2 TABLETS BY MOUTH EVERY DAY 180 tablet 3  . metFORMIN (GLUCOPHAGE-XR) 500 MG 24 hr tablet Take 1 tablet (500 mg total) by mouth 2 (two) times daily. Please schedule follow up for further refills. 60 tablet 0  . Probiotic Product (PROBIOTIC PO) Take 1 tablet by mouth every other day.    . vortioxetine HBr (TRINTELLIX) 5 MG TABS tablet Take 0.5 tablets (2.5 mg total) by mouth daily. 90 tablet 1   No facility-administered medications prior to visit.     EXAM:  BP (!) 146/92   Pulse 74   Temp 98.2 F (36.8 C) (Oral)   Ht 5\' 8"  (1.727 m)   Wt 213 lb (96.6 kg)   SpO2 99%   BMI 32.39 kg/m   Body mass index is 32.39 kg/m. Wt Readings from Last 3 Encounters:  10/19/20 213 lb (96.6 kg)  05/15/20 214 lb (97.1 kg)  01/15/20 214 lb 6.4 oz (97.3 kg)    Physical Exam: Vital signs reviewed GGE:ZMOQ is a well-developed well-nourished alert cooperative    who appearsr stated age in no acute distress.  HEENT: normocephalic atraumatic , Eyes: PERRL EOM's full, conjunctiva clear, Nares: paten,t no  deformity discharge or tenderness., Ears: no deformity EAC's clear TMs with normal landmarks. Mouth: masked  NECK: supple without masses, thyromegaly or bruits. CHEST/PULM:  Clear to auscultation and percussion breath sounds equal no wheeze , rales or rhonchi. No chest wall deformities or tenderness CV: PMI is nondisplaced, S1 S2 no gallops, murmurs, rubs. Peripheral pulses are full without delay.No JVD .  ABDOMEN: Bowel sounds normal nontender  No guard or rebound, no hepato splenomegal no CVA tenderness.   Extremtities:  No clubbing cyanosis or edema, no acute joint swelling or redness no focal atrophy NEURO:  Oriented x3, cranial nerves 3-12 appear to be intact, no obvious focal weakness,gait within normal limits no abnormal reflexes or asymmetrical SKIN: No acute rashes normal turgor, color, no bruising or petechiae. Many sacly sun changes   Head scalp  A keratotic lesion SK? Left temple ? Vs other  8 mm   PSYCH: Oriented, good eye contact, no obvious depression anxiety, cognition and judgment appear normal. LN: no cervical axillary inguinal adenopathy Diabetic Foot Exam - Simple   Simple Foot Form Diabetic Foot exam was performed with the following findings: Yes 10/19/2020 12:30 PM  Visual Inspection No deformities, no ulcerations, no other skin breakdown bilaterally: Yes See comments: Yes Sensation Testing Intact to touch and monofilament testing bilaterally: Yes Pulse Check Posterior Tibialis and Dorsalis pulse intact bilaterally: Yes Comments Mild mod callous  Great toe and metatarsal      Lab Results  Component Value Date   WBC 12.4 (H) 01/05/2020   HGB 18.4 (H) 01/15/2020   HCT 54.0 (H) 01/15/2020   PLT 243 01/05/2020   GLUCOSE 139 (H) 01/15/2020   CHOL 222 (H) 01/04/2019   TRIG 157.0 (H) 01/04/2019   HDL 42.50 01/04/2019   LDLDIRECT 190.2 12/11/2013   LDLCALC 148 (H) 01/04/2019   ALT 33 01/05/2020   AST 24 01/05/2020   NA 141 01/15/2020   K 3.2 (L) 01/15/2020   CL  103 01/15/2020   CREATININE 1.10 01/15/2020   BUN 16 01/15/2020   CO2 23 01/05/2020   TSH 0.78 05/24/2017   PSA 0.84 01/04/2019   HGBA1C 6.6 (H) 01/04/2019   MICROALBUR 0.9 01/04/2019    BP Readings from Last 3 Encounters:  10/19/20 (!) 146/92  05/15/20 130/76  01/15/20 (!) 143/94    Lab plan  reviewed with patient   ASSESSMENT AND PLAN:  Discussed the following assessment and plan:    ICD-10-CM   1. Visit for preventive health examination  Z00.00 Hemoglobin A1c    TSH    Hepatic function panel    Lipid panel    BASIC METABOLIC PANEL WITH GFR    CBC with Differential/Platelet    Microalbumin / creatinine urine ratio    PSA    PSA    CBC with Differential/Platelet    BASIC METABOLIC PANEL WITH GFR    Lipid panel    Hepatic function panel    TSH    Hemoglobin A1c    Microalbumin / creatinine urine ratio  2. Hyperlipidemia, unspecified hyperlipidemia type  E78.5 Hemoglobin A1c    TSH    Hepatic function panel    Lipid panel    BASIC METABOLIC PANEL WITH GFR    CBC with Differential/Platelet    Microalbumin / creatinine urine ratio    CBC with Differential/Platelet    BASIC METABOLIC PANEL WITH GFR    Lipid panel    Hepatic function panel    TSH    Hemoglobin A1c    Microalbumin / creatinine urine ratio  3. Medication management  Z79.899 Hemoglobin A1c    TSH    Hepatic function panel  Lipid panel    BASIC METABOLIC PANEL WITH GFR    CBC with Differential/Platelet    Microalbumin / creatinine urine ratio    CBC with Differential/Platelet    BASIC METABOLIC PANEL WITH GFR    Lipid panel    Hepatic function panel    TSH    Hemoglobin A1c    Microalbumin / creatinine urine ratio  4. Prediabetes  R73.03 Hemoglobin A1c    TSH    Hepatic function panel    Lipid panel    BASIC METABOLIC PANEL WITH GFR    CBC with Differential/Platelet    Microalbumin / creatinine urine ratio    CBC with Differential/Platelet    BASIC METABOLIC PANEL WITH GFR    Lipid  panel    Hepatic function panel    TSH    Hemoglobin A1c    Microalbumin / creatinine urine ratio  5. Essential hypertension  I10 Hemoglobin A1c    TSH    Hepatic function panel    Lipid panel    BASIC METABOLIC PANEL WITH GFR    CBC with Differential/Platelet    Microalbumin / creatinine urine ratio    CBC with Differential/Platelet    BASIC METABOLIC PANEL WITH GFR    Lipid panel    Hepatic function panel    TSH    Hemoglobin A1c    Microalbumin / creatinine urine ratio   get  home readings to ensure control adjust ,ed and lifesstyle as indicated  6. Need for influenza vaccination  Z23 Flu Vaccine QUAD 6+ mos PF IM (Fluarix Quad PF)  7. Screening PSA (prostate specific antigen)  Z12.5 PSA    PSA  8. Other fatigue  R53.83 Hemoglobin A1c    TSH    Hepatic function panel    Lipid panel    BASIC METABOLIC PANEL WITH GFR    CBC with Differential/Platelet    Microalbumin / creatinine urine ratio    CBC with Differential/Platelet    BASIC METABOLIC PANEL WITH GFR    Lipid panel    Hepatic function panel    TSH    Hemoglobin A1c    Microalbumin / creatinine urine ratio  9. Colon cancer screening  Z12.11 Ambulatory referral to Gastroenterology  updated monitoring blood testing   Screening discussed     Plan  rov in 6 months depending   See  Derm as planned   See instructions  Return in about 6 months (around 04/18/2021) for depending on results.  Patient Care Team: Burnis Medin, MD as PCP - General Thayer Headings, PMHNP as Nurse Practitioner (Psychiatry) Ceasar Mons, MD as Consulting Physician (Urology) Patient Instructions    bp today not at goal  In office     But check to see if at home and send in readings Please bring your blood pressure cuff to next appointment Take blood pressure readings twice a day for 3-5 days and record .     Take 2 -3 readings at each sitting .   Can send in readings  by My Chart.   Before checking your blood pressure  make sure: You are seated and quite for 5 min before checking Feet are flat on the floor Siting in chair with your back supported straight up and down Arm resting on table or arm of chair at heart level Bladder is empty You have NOT had caffeine or tobacco within the last 30 min    Will notify you  of labs when  available.   Get bmi under 91 for health reasons  See derm at some point  To check skin for  Precancers and concerns  Gt injection appt when want to to shingrix vaccine   Will refer for colonoscopy as due  .    Standley Brooking. Cristopher Ciccarelli M.D.

## 2020-10-20 LAB — HEPATIC FUNCTION PANEL
AG Ratio: 2.1 (calc) (ref 1.0–2.5)
ALT: 31 U/L (ref 9–46)
AST: 20 U/L (ref 10–35)
Albumin: 4.4 g/dL (ref 3.6–5.1)
Alkaline phosphatase (APISO): 61 U/L (ref 35–144)
Bilirubin, Direct: 0.1 mg/dL (ref 0.0–0.2)
Globulin: 2.1 g/dL (calc) (ref 1.9–3.7)
Indirect Bilirubin: 0.6 mg/dL (calc) (ref 0.2–1.2)
Total Bilirubin: 0.7 mg/dL (ref 0.2–1.2)
Total Protein: 6.5 g/dL (ref 6.1–8.1)

## 2020-10-20 LAB — CBC WITH DIFFERENTIAL/PLATELET
Absolute Monocytes: 909 cells/uL (ref 200–950)
Basophils Absolute: 81 cells/uL (ref 0–200)
Basophils Relative: 0.8 %
Eosinophils Absolute: 313 cells/uL (ref 15–500)
Eosinophils Relative: 3.1 %
HCT: 49.2 % (ref 38.5–50.0)
Hemoglobin: 17.2 g/dL — ABNORMAL HIGH (ref 13.2–17.1)
Lymphs Abs: 3485 cells/uL (ref 850–3900)
MCH: 30.9 pg (ref 27.0–33.0)
MCHC: 35 g/dL (ref 32.0–36.0)
MCV: 88.5 fL (ref 80.0–100.0)
MPV: 10.6 fL (ref 7.5–12.5)
Monocytes Relative: 9 %
Neutro Abs: 5313 cells/uL (ref 1500–7800)
Neutrophils Relative %: 52.6 %
Platelets: 250 10*3/uL (ref 140–400)
RBC: 5.56 10*6/uL (ref 4.20–5.80)
RDW: 12.5 % (ref 11.0–15.0)
Total Lymphocyte: 34.5 %
WBC: 10.1 10*3/uL (ref 3.8–10.8)

## 2020-10-20 LAB — MICROALBUMIN / CREATININE URINE RATIO
Creatinine, Urine: 230 mg/dL (ref 20–320)
Microalb Creat Ratio: 3 mcg/mg creat (ref <30)
Microalb, Ur: 0.8 mg/dL

## 2020-10-20 LAB — BASIC METABOLIC PANEL WITH GFR
BUN: 16 mg/dL (ref 7–25)
CO2: 26 mmol/L (ref 20–32)
Calcium: 9.9 mg/dL (ref 8.6–10.3)
Chloride: 101 mmol/L (ref 98–110)
Creat: 1.15 mg/dL (ref 0.70–1.25)
GFR, Est African American: 79 mL/min/{1.73_m2} (ref >=60)
GFR, Est Non African American: 68 mL/min/{1.73_m2} (ref >=60)
Glucose, Bld: 105 mg/dL — ABNORMAL HIGH (ref 65–99)
Potassium: 3.7 mmol/L (ref 3.5–5.3)
Sodium: 140 mmol/L (ref 135–146)

## 2020-10-20 LAB — TSH: TSH: 0.82 mIU/L (ref 0.40–4.50)

## 2020-10-20 LAB — HEMOGLOBIN A1C
Hgb A1c MFr Bld: 6.5 % of total Hgb — ABNORMAL HIGH (ref <5.7)
Mean Plasma Glucose: 140 (calc)
eAG (mmol/L): 7.7 (calc)

## 2020-10-20 LAB — LIPID PANEL
Cholesterol: 258 mg/dL — ABNORMAL HIGH (ref <200)
HDL: 52 mg/dL (ref >=40)
LDL Cholesterol (Calc): 170 mg/dL (calc) — ABNORMAL HIGH
Non-HDL Cholesterol (Calc): 206 mg/dL (calc) — ABNORMAL HIGH (ref <130)
Total CHOL/HDL Ratio: 5 (calc) — ABNORMAL HIGH (ref <5.0)
Triglycerides: 198 mg/dL — ABNORMAL HIGH (ref <150)

## 2020-10-20 LAB — PSA: PSA: 0.81 ng/mL (ref <=4.0)

## 2020-10-23 ENCOUNTER — Ambulatory Visit: Payer: BC Managed Care – PPO | Admitting: Psychiatry

## 2020-11-01 ENCOUNTER — Other Ambulatory Visit: Payer: Self-pay | Admitting: Internal Medicine

## 2020-11-03 ENCOUNTER — Ambulatory Visit (INDEPENDENT_AMBULATORY_CARE_PROVIDER_SITE_OTHER): Payer: BC Managed Care – PPO | Admitting: Psychology

## 2020-11-03 DIAGNOSIS — F4323 Adjustment disorder with mixed anxiety and depressed mood: Secondary | ICD-10-CM | POA: Diagnosis not present

## 2020-11-03 DIAGNOSIS — F330 Major depressive disorder, recurrent, mild: Secondary | ICD-10-CM

## 2020-11-03 NOTE — Progress Notes (Signed)
Blood sugar is stable   Urine protein in normal range  Cholesterol however is too high  this time  The record shows that you were not  able to take simvastatin or  crestor? Maybe   are  you willing to try a different statin ? Atorvastatin or pravastatin? If not then we could try zetia 10 mg per day . Let us know   we could do a video visit if needed to discuss  Either way we should  fu lipid panel

## 2020-11-17 ENCOUNTER — Ambulatory Visit (INDEPENDENT_AMBULATORY_CARE_PROVIDER_SITE_OTHER): Payer: BC Managed Care – PPO | Admitting: Psychology

## 2020-11-17 DIAGNOSIS — F4323 Adjustment disorder with mixed anxiety and depressed mood: Secondary | ICD-10-CM | POA: Diagnosis not present

## 2020-11-17 DIAGNOSIS — F330 Major depressive disorder, recurrent, mild: Secondary | ICD-10-CM

## 2020-11-30 ENCOUNTER — Telehealth: Payer: Self-pay | Admitting: Internal Medicine

## 2020-11-30 NOTE — Telephone Encounter (Signed)
Okay to order Cologuard Please put an order for routine colon cancer screening.  (Reviewed record had colonoscopy 2011 one small hyperplastic polyp felt to be average risk 10-year follow-up per Dr. Deatra Ina.)

## 2020-11-30 NOTE — Telephone Encounter (Signed)
Patient was wanting to know if you can place an order for cologuard to be sent to his house instead of having a full blown colonoscopy.  Please advise

## 2020-11-30 NOTE — Telephone Encounter (Signed)
Okay for cologuard

## 2020-12-01 NOTE — Telephone Encounter (Signed)
Cologuard form has been completed. Form, demographics, insurance information has been faxed and confirmed.

## 2020-12-02 ENCOUNTER — Ambulatory Visit (INDEPENDENT_AMBULATORY_CARE_PROVIDER_SITE_OTHER): Payer: BC Managed Care – PPO | Admitting: Psychiatry

## 2020-12-02 ENCOUNTER — Other Ambulatory Visit: Payer: Self-pay

## 2020-12-02 ENCOUNTER — Encounter: Payer: Self-pay | Admitting: Psychiatry

## 2020-12-02 DIAGNOSIS — F332 Major depressive disorder, recurrent severe without psychotic features: Secondary | ICD-10-CM | POA: Diagnosis not present

## 2020-12-02 DIAGNOSIS — F3342 Major depressive disorder, recurrent, in full remission: Secondary | ICD-10-CM

## 2020-12-02 MED ORDER — VORTIOXETINE HBR 5 MG PO TABS: ORAL_TABLET | ORAL | 1 refills | Status: DC

## 2020-12-02 MED ORDER — DESVENLAFAXINE SUCCINATE ER 25 MG PO TB24: 25.0000 mg | ORAL_TABLET | Freq: Every morning | ORAL | 1 refills | Status: DC

## 2020-12-02 NOTE — Progress Notes (Signed)
CELEDONIO LOUP [id number] 1961-02-19 59 y.o.  Subjective:   Patient ID:  Jonathan [name] is a 59 y.o. (DOB 10-Nov-1961) male.  Chief Complaint:  Chief Complaint  Patient presents with  . Depression  . Anxiety    HPI Jonathan [name] presents to the office today for follow-up of depression, anxiety, and difficulty with concentration.  He has been taking Trintellix 5 mg every other day. He reports that it was difficult to split Trintellix 5 mg tabs. He reports that he feels that he is "either getting the benefit and lethargy or no benefit without the lethargy." He reports that December has been "very detail oriented" and felt "overwhelmed by details" and was easily irritated. He reports feeling "negative" and others have commented that he is negative and "grumpy." He reports that he has had significant worry and some catastrophic thinking. Denies any physical s/s with anxiety. Concentration and "clarity" has declined. Lower energy and motivation. Diminished interest in things.He reports adequate sleep amount and awakens feeling that he is not rested. Appetite has been decreased and has some cravings for sweets. He reports having a feeling, "that I just don't like living right now." Denies suicidal intent or plan.   He is taking a week of vacation this week. Daughter is dealing with health issues that are going to prevent her from returning to school this quarter. He reports that everyone will likely be home for the next 6-8 weeks. 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  Mother and brother came to visit for a few days over Christmas.   Past Psychiatric Medication Trials: Citalopram- Sexual side effects, fatigue. Partially effective for depression. Increased to 15 mg in the last month and noticed more fatigue. He reports that he has been taking 10 mg most of the time since then. Trintellix- Improved mood,  anxiety, and focus on 5 mg. Excessive somnolence on 10 mg. Wellbutrin- Took 25 years ago.    [place] Office Visit from 10/19/2020 in [place] at [place] from 08/02/2019 in [place] at [facility] from 10/01/2018 in [place] at [facility] from 05/24/2017 in [place] at [facility] Total Score 0 4 0 0  PHQ-9 Total Score 9 15 -- --       Review of Systems:  Review of Systems  Musculoskeletal: Negative for gait problem.  Neurological: Positive for weakness.  Psychiatric/Behavioral:       Please refer to HPI    Medications: I have reviewed the patient's current medications.  Current Outpatient Medications  Medication Sig Dispense Refill  . Desvenlafaxine Succinate ER 25 MG TB24 Take 25 mg by mouth in the morning. 30 tablet 1  . loratadine (CLARITIN) 10 MG tablet Take 10 mg by mouth every other day.    . losartan-hydrochlorothiazide (HYZAAR) 50-12.5 MG tablet TAKE 2 TABLETS BY MOUTH EVERY DAY 180 tablet 3  . metFORMIN (GLUCOPHAGE-XR) 500 MG 24 hr tablet TAKE 1 TABLET BY MOUTH TWICE A DAY 180 tablet 3  . Probiotic Product (PROBIOTIC PO) Take 1 tablet by mouth every other day.    . vortioxetine HBr (TRINTELLIX) 5 MG TABS tablet Take every other day for one week, then stop 90 tablet 1   No current facility-administered medications for this visit.    Medication Side Effects: Other: Lethargy  Allergies:  Allergies  Allergen Reactions  . Zocor [Simvastatin]     ?  myalgias  . Penicillins Hives    Per pt - happened in childhood - hives possible reaction    Past Medical History:  Diagnosis Date  . Allergic rhinitis   . Anxiety   . Depression   . Diabetes mellitus without complication (HCC)    type 2  . History of kidney stones   . History of nephrolithiasis   . Hyperlipidemia   . Hypertension   . Sebaceous cyst     Family History  Problem Relation Age of Onset  . Diabetes  Father   . Hypertension Father   . Kidney cancer Father        now on dialysis  . Kidney Stones Father   . Kidney Stones Brother   . Huntington's disease Maternal Grandfather   . Depression Daughter   . Anxiety disorder Daughter   . ADD / ADHD Daughter     Social History   Socioeconomic History  . Marital status: Married    Spouse name: Not on file  . Number of children: 1  . Years of education: Not on file  . Highest education level: Not on file  Occupational History  . Occupation: MINISTER    Employer: [employer]  Tobacco Use  . Smoking status: Never Smoker  . Smokeless tobacco: Never Used  Vaping Use  . Vaping Use: Never used  Substance and Sexual Activity  . Alcohol use: Yes    Comment: less than 1 a week  . Drug use: No  . Sexual activity: Yes  Other Topics Concern  . Not on file  Social History Narrative   Married   Regular exercise- no   HH of 3   Dog car and lizard   Sleep wakening at times   New job [employer]   Per week reg sleep.    Social Determinants of Health   Financial Resource Strain: Not on file  Food Insecurity: Not on file  Transportation Needs: Not on file  Physical Activity: Not on file  Stress: Not on file  Social Connections: Not on file  Intimate Partner Violence: Not on file    Past Medical History, Surgical history, Social history, and Family history were reviewed and updated as appropriate.   Please see review of systems for further details on the patient's review from today.   Objective:   Physical Exam:  There were no vitals taken for this visit.  Physical Exam Constitutional:      General: He is not in acute distress. Musculoskeletal:        General: No deformity.  Neurological:     Mental Status: He is alert and oriented to person, place, and time.     Coordination: Coordination normal.  Psychiatric:        Attention and Perception: Attention and perception normal. He does not perceive auditory or  visual hallucinations.        Mood and Affect: Mood is anxious and depressed. Affect is not labile, blunt, angry or inappropriate.        Speech: Speech normal.        Behavior: Behavior normal.        Thought Content: Thought content normal. Thought content is not paranoid or delusional. Thought content does not include homicidal or suicidal ideation. Thought content does not include homicidal or suicidal plan.        Cognition and Memory: Cognition and memory normal.        Judgment: Judgment normal.  Comments: Insight intact     Lab Review:     Component Value Date/Time   NA 140 10/19/2020 1206   K 3.7 10/19/2020 1206   CL 101 10/19/2020 1206   CO2 26 10/19/2020 1206   GLUCOSE 105 (H) 10/19/2020 1206   BUN 16 10/19/2020 1206   CREATININE 1.15 10/19/2020 1206   CALCIUM 9.9 10/19/2020 1206   PROT 6.5 10/19/2020 1206   ALBUMIN 3.9 01/05/2020 1014   AST 20 10/19/2020 1206   ALT 31 10/19/2020 1206   ALKPHOS 54 01/05/2020 1014   BILITOT 0.7 10/19/2020 1206   GFRNONAA 68 10/19/2020 1206   GFRAA 79 10/19/2020 1206       Component Value Date/Time   WBC 10.1 10/19/2020 1206   RBC 5.56 10/19/2020 1206   HGB 17.2 (H) 10/19/2020 1206   HCT 49.2 10/19/2020 1206   PLT 250 10/19/2020 1206   MCV 88.5 10/19/2020 1206   MCV 94.2 04/29/2013 0936   MCH 30.9 10/19/2020 1206   MCHC 35.0 10/19/2020 1206   RDW 12.5 10/19/2020 1206   LYMPHSABS 3,485 10/19/2020 1206   MONOABS 0.9 09/05/2018 0754   EOSABS 313 10/19/2020 1206   BASOSABS 81 10/19/2020 1206    No results found for: POCLITH, LITHIUM   No results found for: PHENYTOIN, PHENOBARB, VALPROATE, CBMZ   .res Assessment: Plan:   Discussed potential benefits, risks, and side effects of Viibryd and Desvenlafaxine. Pt agrees to trial of Desvenlafaxine. Will start Desvenlafaxine 25 mg po q am for depression.  Continue Trintellix 5 mg every other day for one week, then stop.  Discussed considering Viibryd if unable to tolerate  Pristiq. Recommend continuing therapy with Bambi Cottle, LCSW.  Pt to follow-up in 4-6 weeks or sooner if clinically indicated.  Patient advised to contact office with any questions, adverse effects, or acute worsening in signs and symptoms.  Mamon was seen today for depression and anxiety.  Diagnoses and all orders for this visit:  Severe episode of recurrent major depressive disorder, without psychotic features (HCC) -     Desvenlafaxine Succinate ER 25 MG TB24; Take 25 mg by mouth in the morning.  Recurrent major depressive disorder, in full remission (HCC) -     vortioxetine HBr (TRINTELLIX) 5 MG TABS tablet; Take every other day for one week, then stop     Please see After Visit Summary for patient specific instructions.  Future Appointments  Date Time Provider [place]  12/09/2020  9:30 AM Panosh, Standley Brooking, MD [facility]  12/10/2020  9:00 AM Cottle, Lucious Groves, LCSW [facility] None  12/29/2020  9:00 AM Cottle, Bambi G, LCSW [facility] None  12/30/2020  1:30 PM Thayer Headings, PMHNP [facility] None  01/15/2021  4:00 PM [facility] PREVISIT RM 51 [facility]  01/29/2021 11:30 AM [place], MD [facility]    No orders of the defined types were placed in this encounter.   -------------------------------

## 2020-12-09 ENCOUNTER — Telehealth: Payer: BC Managed Care – PPO | Admitting: Internal Medicine

## 2020-12-10 ENCOUNTER — Ambulatory Visit (INDEPENDENT_AMBULATORY_CARE_PROVIDER_SITE_OTHER): Payer: BC Managed Care – PPO | Admitting: Psychology

## 2020-12-10 DIAGNOSIS — F900 Attention-deficit hyperactivity disorder, predominantly inattentive type: Secondary | ICD-10-CM | POA: Diagnosis not present

## 2020-12-10 DIAGNOSIS — F330 Major depressive disorder, recurrent, mild: Secondary | ICD-10-CM

## 2020-12-10 DIAGNOSIS — F4323 Adjustment disorder with mixed anxiety and depressed mood: Secondary | ICD-10-CM | POA: Diagnosis not present

## 2020-12-23 DIAGNOSIS — Z1211 Encounter for screening for malignant neoplasm of colon: Secondary | ICD-10-CM | POA: Diagnosis not present

## 2020-12-23 DIAGNOSIS — Z1212 Encounter for screening for malignant neoplasm of rectum: Secondary | ICD-10-CM | POA: Diagnosis not present

## 2020-12-29 ENCOUNTER — Ambulatory Visit (INDEPENDENT_AMBULATORY_CARE_PROVIDER_SITE_OTHER): Payer: BC Managed Care – PPO | Admitting: Psychology

## 2020-12-29 DIAGNOSIS — F330 Major depressive disorder, recurrent, mild: Secondary | ICD-10-CM

## 2020-12-29 DIAGNOSIS — F4323 Adjustment disorder with mixed anxiety and depressed mood: Secondary | ICD-10-CM

## 2020-12-29 DIAGNOSIS — F900 Attention-deficit hyperactivity disorder, predominantly inattentive type: Secondary | ICD-10-CM

## 2020-12-30 ENCOUNTER — Encounter: Payer: Self-pay | Admitting: Psychiatry

## 2020-12-30 ENCOUNTER — Telehealth (INDEPENDENT_AMBULATORY_CARE_PROVIDER_SITE_OTHER): Payer: BC Managed Care – PPO | Admitting: Psychiatry

## 2020-12-30 ENCOUNTER — Telehealth: Payer: Self-pay | Admitting: Psychiatry

## 2020-12-30 DIAGNOSIS — F332 Major depressive disorder, recurrent severe without psychotic features: Secondary | ICD-10-CM

## 2020-12-30 LAB — COLOGUARD: Cologuard: POSITIVE — AB

## 2020-12-30 MED ORDER — DESVENLAFAXINE SUCCINATE ER 25 MG PO TB24: 25.0000 mg | ORAL_TABLET | Freq: Every morning | ORAL | 0 refills | Status: DC

## 2020-12-30 NOTE — Progress Notes (Signed)
Jonathan [name] [id number] [birth year] 60 y.o.  Virtual Visit via Video Note  I connected with pt @ on 12/30/20 at  1:30 PM EST by a video enabled telemedicine application and verified that I am speaking with the correct person using two identifiers.   I discussed the limitations of evaluation and management by telemedicine and the availability of in person appointments. The patient expressed understanding and agreed to proceed.  I discussed the assessment and treatment plan with the patient. The patient was provided an opportunity to ask questions and all were answered. The patient agreed with the plan and demonstrated an understanding of the instructions.   The patient was advised to call back or seek an in-person evaluation if the symptoms worsen or if the condition fails to improve as anticipated.  I provided 30 minutes of non-face-to-face time during this encounter.  The patient was located at home.  The provider was located at [place].   Thayer Headings, PMHNP   Subjective:   Patient ID:  Jonathan [name] is a 60 y.o. (DOB 10/04/1961) male.  Chief Complaint:  Chief Complaint  Patient presents with  . Follow-up    Depression, Anxiety, and ADHD    HPI Jonathan [name] presents for follow-up of depression and anxiety. He reports that today is the 21st day he has been on Pristiq. He reports that he notices "generally a better outlook on life" today after having a dream last night. He reports, "I feel more upbeat." He reports that he has had a decrease in appetite. He reports that irritability has lessened. He reports that he has continued worry and feels that he is gradually able to better respond to it. He feels that energy and motivation is "better... but not where I need it to be." He reports that he is sleeping at least 7 hours a night and occasionally taking naps. He reports that his family comments that he is inattentive and "scattered." He reports that he  recently went to the grocery store and came back with items that were not related to what they had discussed.He reports that he is not letting family complete their sentences and is already thinking about how he can help solve their problem. He reports very slight improvement in concentration. Improved interest and enjoyment in things. Denies passive death wishes or SI.   Daughter has been having health concerns. He reports that he has had increased work with the start of the year.   Continues to see Bambi Cottle, LCSW.  Past Psychiatric Medication Trials: Citalopram- Sexual side effects, fatigue. Partially effective for depression. Increased to 15 mg in the last month and noticed more fatigue. He reports that he has been taking 10 mg most of the time since then. Trintellix- Improved mood, anxiety, and focus on 5 mg. Excessive somnolence on 10 mg. Wellbutrin- Took 25 years ago.Cannot recall response.   Review of Systems:  Review of Systems  Constitutional: Negative for diaphoresis.  Musculoskeletal: Negative for gait problem.  Neurological: Negative for headaches.  Psychiatric/Behavioral:       Please refer to HPI  He reports improved glycemic control.   Medications: I have reviewed the patient's current medications.  Current Outpatient Medications  Medication Sig Dispense Refill  . Desvenlafaxine Succinate ER 25 MG TB24 Take 25 mg by mouth in the morning. 90 tablet 0  . loratadine (CLARITIN) 10 MG tablet Take 10 mg by mouth every other day.    . losartan-hydrochlorothiazide (HYZAAR) 50-12.5 MG tablet TAKE 2  TABLETS BY MOUTH EVERY DAY 180 tablet 3  . metFORMIN (GLUCOPHAGE-XR) 500 MG 24 hr tablet TAKE 1 TABLET BY MOUTH TWICE A DAY 180 tablet 3  . Probiotic Product (PROBIOTIC PO) Take 1 tablet by mouth every other day.     No current facility-administered medications for this visit.    Medication Side Effects: Appetite Suppression  Allergies:  Allergies  Allergen Reactions  .  Zocor [Simvastatin]     ? myalgias  . Penicillins Hives    Per pt - happened in childhood - hives possible reaction    Past Medical History:  Diagnosis Date  . Allergic rhinitis   . Anxiety   . Depression   . Diabetes mellitus without complication ([place])    type 2  . History of kidney stones   . History of nephrolithiasis   . Hyperlipidemia   . Hypertension   . Sebaceous cyst     Family History  Problem Relation Age of Onset  . Diabetes Father   . Hypertension Father   . Kidney cancer Father        now on dialysis  . Kidney Stones Father   . Kidney Stones Brother   . Huntington's disease Maternal Grandfather   . Depression Daughter   . Anxiety disorder Daughter   . ADD / ADHD Daughter     Social History   Socioeconomic History  . Marital status: Married    Spouse name: Not on file  . Number of children: 1  . Years of education: Not on file  . Highest education level: Not on file  Occupational History  . Occupation: MINISTER    Employer: [employer]  Tobacco Use  . Smoking status: Never Smoker  . Smokeless tobacco: Never Used  Vaping Use  . Vaping Use: Never used  Substance and Sexual Activity  . Alcohol use: Yes    Comment: less than 1 a week  . Drug use: No  . Sexual activity: Yes  Other Topics Concern  . Not on file  Social History Narrative   Married   Regular exercise- no   HH of 3   Dog car and lizard   Sleep wakening at times   New job [employer]   Per week reg sleep.    Social Determinants of Health   Financial Resource Strain: Not on file  Food Insecurity: Not on file  Transportation Needs: Not on file  Physical Activity: Not on file  Stress: Not on file  Social Connections: Not on file  Intimate Partner Violence: Not on file    Past Medical History, Surgical history, Social history, and Family history were reviewed and updated as appropriate.   Please see review of systems for further details on the patient's review  from today.   Objective:   Physical Exam:  There were no vitals taken for this visit.  Physical Exam Neurological:     Mental Status: He is alert and oriented to person, place, and time.     Cranial Nerves: No dysarthria.  Psychiatric:        Attention and Perception: Attention and perception normal.        Speech: Speech normal.        Behavior: Behavior is cooperative.        Thought Content: Thought content normal. Thought content is not paranoid or delusional. Thought content does not include homicidal or suicidal ideation. Thought content does not include homicidal or suicidal plan.  Cognition and Memory: Cognition and memory normal.        Judgment: Judgment normal.     Comments: Insight intact Mood is less anxious and less depressed     Lab Review:     Component Value Date/Time   NA 140 10/19/2020 1206   K 3.7 10/19/2020 1206   CL 101 10/19/2020 1206   CO2 26 10/19/2020 1206   GLUCOSE 105 (H) 10/19/2020 1206   BUN 16 10/19/2020 1206   CREATININE 1.15 10/19/2020 1206   CALCIUM 9.9 10/19/2020 1206   PROT 6.5 10/19/2020 1206   ALBUMIN 3.9 01/05/2020 1014   AST 20 10/19/2020 1206   ALT 31 10/19/2020 1206   ALKPHOS 54 01/05/2020 1014   BILITOT 0.7 10/19/2020 1206   GFRNONAA 68 10/19/2020 1206   GFRAA 79 10/19/2020 1206       Component Value Date/Time   WBC 10.1 10/19/2020 1206   RBC 5.56 10/19/2020 1206   HGB 17.2 (H) 10/19/2020 1206   HCT 49.2 10/19/2020 1206   PLT 250 10/19/2020 1206   MCV 88.5 10/19/2020 1206   MCV 94.2 04/29/2013 0936   MCH 30.9 10/19/2020 1206   MCHC 35.0 10/19/2020 1206   RDW 12.5 10/19/2020 1206   LYMPHSABS 3,485 10/19/2020 1206   MONOABS 0.9 09/05/2018 0754   EOSABS 313 10/19/2020 1206   BASOSABS 81 10/19/2020 1206    No results found for: POCLITH, LITHIUM   No results found for: PHENYTOIN, PHENOBARB, VALPROATE, CBMZ   .res Assessment: Plan:    Discussed possible treatment options. Discussed that he is likely to  experience further improvement in mood and anxiety s/s over the the next couple of weeks with Pristiq 25 mg po qd since he has started to notice a partial improvement in s/s after 3 weeks. Discussed that 25 mg of Pristiq is considered low dose and that dose can be increased further. Discussed considering treatment for ADD if concentration difficulties do not improve significantly with treatment of anxiety and depression.  Pt reports that he would prefer to continue Pristiq 25 mg po qd for a longer period of time and then re-evaluate. Will continue Pristiq 25 mg po qd for depression and anxiety.  Recommend continuing therapy with Bambi Cottle, LCSW. Pt to follow-up in 1-2 months or sooner if clinically indicated.  Patient advised to contact office with any questions, adverse effects, or acute worsening in signs and symptoms.  Aadith was seen today for follow-up.  Diagnoses and all orders for this visit:  Severe episode of recurrent major depressive disorder, without psychotic features (HCC) -     Desvenlafaxine Succinate ER 25 MG TB24; Take 25 mg by mouth in the morning.     Please see After Visit Summary for patient specific instructions.  Future Appointments  Date Time Provider [place]  01/20/2021  9:00 AM Cottle, Lucious Groves, LCSW [facility] None  02/18/2021  8:30 AM Thayer Headings, PMHNP [facility] None    No orders of the defined types were placed in this encounter.     -------------------------------

## 2020-12-30 NOTE — Telephone Encounter (Signed)
Mr. sherwood, castilla are scheduled for a virtual visit with your provider today.    Just as we do with appointments in the office, we must obtain your consent to participate.  Your consent will be active for this visit and any virtual visit you may have with one of our providers in the next 365 days.    If you have a MyChart account, I can also send a copy of this consent to you electronically.  All virtual visits are billed to your insurance company just like a traditional visit in the office.  As this is a virtual visit, video technology does not allow for your provider to perform a traditional examination.  This may limit your provider's ability to fully assess your condition.  If your provider identifies any concerns that need to be evaluated in person or the need to arrange testing such as labs, EKG, etc, we will make arrangements to do so.    Although advances in technology are sophisticated, we cannot ensure that it will always work on either your end or our end.  If the connection with a video visit is poor, we may have to switch to a telephone visit.  With either a video or telephone visit, we are not always able to ensure that we have a secure connection.   I need to obtain your verbal consent now.   Are you willing to proceed with your visit today?   NOLE ROBEY has provided verbal consent on 12/30/2020 for a virtual visit (video or telephone).   Thayer Headings, PMHNP 12/30/2020  1:49 PM

## 2020-12-31 ENCOUNTER — Telehealth: Payer: Self-pay | Admitting: Internal Medicine

## 2020-12-31 DIAGNOSIS — R195 Other fecal abnormalities: Secondary | ICD-10-CM

## 2020-12-31 NOTE — Telephone Encounter (Signed)
[facility] call and stated pt have a abnormal colorguard test and case # is [id number] can call her back at [phone].

## 2021-01-04 NOTE — Telephone Encounter (Signed)
Called and spoke with Terri at [facility] they received an abnormal test from the patient on 12/31/2020.    Results were faxed to office on that day.  Will refax another copy today.

## 2021-01-06 NOTE — Telephone Encounter (Signed)
Called pt and left VM to return call to office. -JMA 

## 2021-01-06 NOTE — Telephone Encounter (Signed)
Cologuard screen is positive . He will need referral for colonoscopy. Doesn't mean he has cancer could be polyps or negative    Refer  to Gi for colonoscopy to [facility] or  Gi group of choice. Marland Kitchen

## 2021-01-06 NOTE — Telephone Encounter (Signed)
Please notify patient of results.

## 2021-01-13 NOTE — Telephone Encounter (Signed)
Please  Contact patient again  About the positive  cologuard  Does he  Know about his positive cologuard . It appears he has a gi appt in future but not certain  About this .   Also  Still need  Visit video or other   regarding dsicussion of elevated cholesterol levels

## 2021-01-18 NOTE — Telephone Encounter (Signed)
Left message to call back to schedule for an appointment.

## 2021-01-20 ENCOUNTER — Ambulatory Visit (INDEPENDENT_AMBULATORY_CARE_PROVIDER_SITE_OTHER): Payer: BC Managed Care – PPO | Admitting: Psychology

## 2021-01-20 DIAGNOSIS — F900 Attention-deficit hyperactivity disorder, predominantly inattentive type: Secondary | ICD-10-CM | POA: Diagnosis not present

## 2021-01-20 DIAGNOSIS — F4323 Adjustment disorder with mixed anxiety and depressed mood: Secondary | ICD-10-CM | POA: Diagnosis not present

## 2021-01-20 DIAGNOSIS — F330 Major depressive disorder, recurrent, mild: Secondary | ICD-10-CM

## 2021-01-27 NOTE — Telephone Encounter (Signed)
LVM for call back to office to schedule appointment

## 2021-01-29 ENCOUNTER — Encounter: Payer: BC Managed Care – PPO | Admitting: Gastroenterology

## 2021-02-09 ENCOUNTER — Ambulatory Visit (INDEPENDENT_AMBULATORY_CARE_PROVIDER_SITE_OTHER): Payer: BC Managed Care – PPO | Admitting: Psychology

## 2021-02-09 DIAGNOSIS — F900 Attention-deficit hyperactivity disorder, predominantly inattentive type: Secondary | ICD-10-CM | POA: Diagnosis not present

## 2021-02-09 DIAGNOSIS — F330 Major depressive disorder, recurrent, mild: Secondary | ICD-10-CM | POA: Diagnosis not present

## 2021-02-09 DIAGNOSIS — F4323 Adjustment disorder with mixed anxiety and depressed mood: Secondary | ICD-10-CM | POA: Diagnosis not present

## 2021-02-15 NOTE — Telephone Encounter (Signed)
Sorry that you are sick but hopefully will recover without difficulty Symptomatic  Support  Unless  Getting  Short of breath or severe sx.  Chest pain  .   I assume you had the covid vaccine but  dont see it listed . That is the best protection.  I would suggest isolating for closer to 7- 10 days from th onset of symptoms  especially if confirmed by the PCR test ( instead of 5)  to be extremely cautious to avoid transmission. to your daughter.  Also you should have gotten the message that your Cologuard screen  was positive and I see that you have an appointment with GI this week.     Contact them to see if you need to delay  that appointment because of the positive Covid test .

## 2021-02-18 ENCOUNTER — Other Ambulatory Visit: Payer: Self-pay

## 2021-02-18 ENCOUNTER — Ambulatory Visit (AMBULATORY_SURGERY_CENTER): Payer: Self-pay

## 2021-02-18 ENCOUNTER — Ambulatory Visit: Payer: BC Managed Care – PPO | Admitting: Psychiatry

## 2021-02-18 VITALS — Ht 68.0 in | Wt 213.6 lb

## 2021-02-18 DIAGNOSIS — R195 Other fecal abnormalities: Secondary | ICD-10-CM

## 2021-02-18 MED ORDER — PEG-KCL-NACL-NASULF-NA ASC-C 100 G PO SOLR: 1.0000 | Freq: Once | ORAL | 0 refills | Status: AC

## 2021-02-18 NOTE — Progress Notes (Signed)

## 2021-02-22 DIAGNOSIS — N202 Calculus of kidney with calculus of ureter: Secondary | ICD-10-CM | POA: Diagnosis not present

## 2021-02-25 ENCOUNTER — Ambulatory Visit (INDEPENDENT_AMBULATORY_CARE_PROVIDER_SITE_OTHER): Payer: BC Managed Care – PPO | Admitting: Psychiatry

## 2021-02-25 ENCOUNTER — Encounter: Payer: Self-pay | Admitting: Psychiatry

## 2021-02-25 ENCOUNTER — Other Ambulatory Visit: Payer: Self-pay

## 2021-02-25 DIAGNOSIS — F330 Major depressive disorder, recurrent, mild: Secondary | ICD-10-CM | POA: Diagnosis not present

## 2021-02-25 MED ORDER — DESVENLAFAXINE SUCCINATE ER 50 MG PO TB24
50.0000 mg | ORAL_TABLET | Freq: Every day | ORAL | 1 refills | Status: DC
Start: 2021-02-25 — End: 2021-03-22

## 2021-02-25 NOTE — Progress Notes (Signed)
Jonathan [name] [id number] 02/24/1961 60 y.o.  Subjective:   Patient ID:  Jonathan [name] is a 60 y.o. (DOB 07-02-1961) male.  Chief Complaint:  Chief Complaint  Patient presents with  . Depression    HPI KENNER LEWAN presents to the office today for follow-up of depression.  He reports that his daughter had back to back surgeries in February. She was then diagnosed with a rare form of cancer of the appendix. He reports that he had a "covid scare." He reports that Pristiq has helped him "make it through." He reports improved mood- "not as grumpy, more eager" with improved energy and motivation. He reports energy and motivation have improved "but I want more." He reports that mood is lightly low at times for brief periods. He reports that he has had stress and anxiety in response to daughter's cancer diagnosis. He reports that sleep has been disrupted due to sinus infection and over the last few nights his sleep has been better than it has been in months. Appetite has been lower, possibly due to sinus infection. He reports that he wishes he had more "focus and clarity."  He reports that concentration is about the same. Denies SI.   Past Psychiatric Medication Trials: Citalopram- Sexual side effects, fatigue. Partially effective for depression. Increased to 15 mg in the last month and noticed more fatigue. He reports that he has been taking 10 mg most of the time since then. Trintellix- Improved mood, anxiety, and focus on 5 mg. Excessive somnolence on 10 mg. Pristiq Wellbutrin- Took 25 years ago.Cannot recall response.  [place] Office Visit from 10/19/2020 in [place] at [facility] from 08/02/2019 in [place] at [facility] from 10/01/2018 in [place] at [facility] from 05/24/2017 in [place] at [facility] Total Score 0 4 0 0  PHQ-9 Total Score 9 15 -- --       Review of Systems:   Review of Systems  HENT:       Recent sinus infection  Musculoskeletal: Positive for arthralgias. Negative for gait problem.  Neurological: Negative for tremors and headaches.  Psychiatric/Behavioral:       Please refer to HPI   Has colonoscopy scheduled for next week.  Medications: I have reviewed the patient's current medications.    Current Outpatient Medications  Medication Sig Dispense Refill  . B Complex Vitamins (B COMPLEX 1 PO) Take by mouth.    . desvenlafaxine (PRISTIQ) 50 MG 24 hr tablet Take 1 tablet (50 mg total) by mouth daily. 30 tablet 1  . ferrous sulfate 325 (65 FE) MG tablet Take by mouth daily with breakfast.    . loratadine (CLARITIN) 10 MG tablet Take 10 mg by mouth every other day.    . losartan-hydrochlorothiazide (HYZAAR) 50-12.5 MG tablet TAKE 2 TABLETS BY MOUTH EVERY DAY 180 tablet 3  . metFORMIN (GLUCOPHAGE-XR) 500 MG 24 hr tablet TAKE 1 TABLET BY MOUTH TWICE A DAY 180 tablet 3  . Multiple Vitamin (MULTIVITAMIN) LIQD Take 5 mLs by mouth daily.    . Probiotic Product (PROBIOTIC PO) Take 1 tablet by mouth every other day.     No current facility-administered medications for this visit.    Medication Side Effects: Other: Dry mouth  Sexual effects from Trintellix have resolved.   Allergies:  Allergies  Allergen Reactions  . Zocor [Simvastatin]     ? myalgias  . Penicillins Hives    Per pt - happened  in childhood - hives possible reaction    Past Medical History:  Diagnosis Date  . Allergic rhinitis   . Allergy   . Anxiety   . Depression   . Diabetes mellitus without complication ([place])    type 2  . Heart murmur    per pt told it was functional  . History of kidney stones   . History of nephrolithiasis   . Hyperlipidemia   . Hypertension   . Sebaceous cyst     Family History  Problem Relation Age of Onset  . Diabetes Father   . Hypertension Father   . Kidney cancer Father        now on dialysis  . Kidney Stones Father   . Kidney  Stones Brother   . Huntington's disease Maternal Grandfather   . Depression Daughter   . Anxiety disorder Daughter   . ADD / ADHD Daughter   . Colon polyps Daughter 53  . Colon cancer Neg Hx   . Esophageal cancer Neg Hx   . Stomach cancer Neg Hx   . Rectal cancer Neg Hx     Social History   Socioeconomic History  . Marital status: Married    Spouse name: Not on file  . Number of children: 1  . Years of education: Not on file  . Highest education level: Not on file  Occupational History  . Occupation: MINISTER    Employer: [employer]  Tobacco Use  . Smoking status: Never Smoker  . Smokeless tobacco: Never Used  Vaping Use  . Vaping Use: Never used  Substance and Sexual Activity  . Alcohol use: Yes    Comment: less than 1 a week  . Drug use: No  . Sexual activity: Yes  Other Topics Concern  . Not on file  Social History Narrative   Married   Regular exercise- no   HH of 3   Dog car and lizard   Sleep wakening at times   New job [employer]   Per week reg sleep.    Social Determinants of Health   Financial Resource Strain: Not on file  Food Insecurity: Not on file  Transportation Needs: Not on file  Physical Activity: Not on file  Stress: Not on file  Social Connections: Not on file  Intimate Partner Violence: Not on file    Past Medical History, Surgical history, Social history, and Family history were reviewed and updated as appropriate.   Please see review of systems for further details on the patient's review from today.   Objective:   Physical Exam:  BP (!) 147/93   Pulse 73   Physical Exam Constitutional:      General: He is not in acute distress. Musculoskeletal:        General: No deformity.  Neurological:     Mental Status: He is alert and oriented to person, place, and time.     Coordination: Coordination normal.  Psychiatric:        Attention and Perception: Attention and perception normal. He does not perceive auditory or  visual hallucinations.        Mood and Affect: Mood is not anxious. Affect is not labile, blunt, angry or inappropriate.        Speech: Speech normal.        Behavior: Behavior normal. Behavior is cooperative.        Thought Content: Thought content normal. Thought content is not paranoid or delusional. Thought content does not include  homicidal or suicidal ideation. Thought content does not include homicidal or suicidal plan.        Cognition and Memory: Cognition and memory normal.        Judgment: Judgment normal.     Comments: Insight intact Improved mood signs and symptoms with some residual mild depression     Lab Review:     Component Value Date/Time   NA 140 10/19/2020 1206   K 3.7 10/19/2020 1206   CL 101 10/19/2020 1206   CO2 26 10/19/2020 1206   GLUCOSE 105 (H) 10/19/2020 1206   BUN 16 10/19/2020 1206   CREATININE 1.15 10/19/2020 1206   CALCIUM 9.9 10/19/2020 1206   PROT 6.5 10/19/2020 1206   ALBUMIN 3.9 01/05/2020 1014   AST 20 10/19/2020 1206   ALT 31 10/19/2020 1206   ALKPHOS 54 01/05/2020 1014   BILITOT 0.7 10/19/2020 1206   GFRNONAA 68 10/19/2020 1206   GFRAA 79 10/19/2020 1206       Component Value Date/Time   WBC 10.1 10/19/2020 1206   RBC 5.56 10/19/2020 1206   HGB 17.2 (H) 10/19/2020 1206   HCT 49.2 10/19/2020 1206   PLT 250 10/19/2020 1206   MCV 88.5 10/19/2020 1206   MCV 94.2 04/29/2013 0936   MCH 30.9 10/19/2020 1206   MCHC 35.0 10/19/2020 1206   RDW 12.5 10/19/2020 1206   LYMPHSABS 3,485 10/19/2020 1206   MONOABS 0.9 09/05/2018 0754   EOSABS 313 10/19/2020 1206   BASOSABS 81 10/19/2020 1206    No results found for: POCLITH, LITHIUM   No results found for: PHENYTOIN, PHENOBARB, VALPROATE, CBMZ   .res Assessment: Plan:   Discussed potential benefits, risks, and side effects of increasing Pristiq to 50 mg daily to possibly improve residual depressive signs and symptoms, such as energy, motivation, and concentration.  Discussed considering  treatment for attention deficit disorder if residual depressive s/s resolve and concentration difficulties persist. Recommend continuing psychotherapy with Bambi Cottle, LCSW. Patient to follow-up with this provider in 4 to 6 weeks or sooner if clinically indicated. Patient advised to contact office with any questions, adverse effects, or acute worsening in signs and symptoms.  Brandol was seen today for depression.  Diagnoses and all orders for this visit:  Mild episode of recurrent major depressive disorder (HCC) -     desvenlafaxine (PRISTIQ) 50 MG 24 hr tablet; Take 1 tablet (50 mg total) by mouth daily.     Please see After Visit Summary for patient specific instructions.  Future Appointments  Date Time Provider [place]  03/01/2021  9:00 AM Cottle, Lucious Groves, LCSW [facility] None  03/04/2021 10:30 AM [place], MD [facility]  03/17/2021  9:00 AM Cottle, Lucious Groves, LCSW [facility] None  04/05/2021  8:30 AM Thayer Headings, PMHNP [facility] None    No orders of the defined types were placed in this encounter.   -------------------------------

## 2021-03-01 ENCOUNTER — Ambulatory Visit (INDEPENDENT_AMBULATORY_CARE_PROVIDER_SITE_OTHER): Payer: BC Managed Care – PPO | Admitting: Psychology

## 2021-03-01 DIAGNOSIS — F4323 Adjustment disorder with mixed anxiety and depressed mood: Secondary | ICD-10-CM

## 2021-03-01 DIAGNOSIS — F330 Major depressive disorder, recurrent, mild: Secondary | ICD-10-CM | POA: Diagnosis not present

## 2021-03-01 DIAGNOSIS — F900 Attention-deficit hyperactivity disorder, predominantly inattentive type: Secondary | ICD-10-CM | POA: Diagnosis not present

## 2021-03-02 ENCOUNTER — Telehealth: Payer: Self-pay | Admitting: Gastroenterology

## 2021-03-02 NOTE — Telephone Encounter (Signed)
Patient called to advise that one of his Pristiq medication has increased in dose from 25 mg to 50 mg. Wanted to know if that is ok since he has an upcoming procedure scheduled for 03/04/21.

## 2021-03-02 NOTE — Telephone Encounter (Signed)
Returned patient call about increase in medication dosage and informed that medication would not affect the upcoming procedure and he may proceed as instructed by prescribing physician.    Patient verbalized understanding.

## 2021-03-03 ENCOUNTER — Encounter: Payer: Self-pay | Admitting: Gastroenterology

## 2021-03-04 ENCOUNTER — Ambulatory Visit (AMBULATORY_SURGERY_CENTER): Payer: BC Managed Care – PPO | Admitting: Gastroenterology

## 2021-03-04 ENCOUNTER — Other Ambulatory Visit: Payer: Self-pay

## 2021-03-04 ENCOUNTER — Encounter: Payer: Self-pay | Admitting: Gastroenterology

## 2021-03-04 VITALS — BP 108/72 | HR 73 | Temp 98.6°F | Resp 16 | Ht 68.0 in | Wt 213.6 lb

## 2021-03-04 DIAGNOSIS — D129 Benign neoplasm of anus and anal canal: Secondary | ICD-10-CM

## 2021-03-04 DIAGNOSIS — K648 Other hemorrhoids: Secondary | ICD-10-CM

## 2021-03-04 DIAGNOSIS — K5730 Diverticulosis of large intestine without perforation or abscess without bleeding: Secondary | ICD-10-CM

## 2021-03-04 DIAGNOSIS — K621 Rectal polyp: Secondary | ICD-10-CM | POA: Diagnosis not present

## 2021-03-04 DIAGNOSIS — D128 Benign neoplasm of rectum: Secondary | ICD-10-CM

## 2021-03-04 DIAGNOSIS — D124 Benign neoplasm of descending colon: Secondary | ICD-10-CM

## 2021-03-04 DIAGNOSIS — K5140 Inflammatory polyps of colon without complications: Secondary | ICD-10-CM | POA: Diagnosis not present

## 2021-03-04 DIAGNOSIS — D122 Benign neoplasm of ascending colon: Secondary | ICD-10-CM | POA: Diagnosis not present

## 2021-03-04 DIAGNOSIS — R195 Other fecal abnormalities: Secondary | ICD-10-CM | POA: Diagnosis not present

## 2021-03-04 DIAGNOSIS — D123 Benign neoplasm of transverse colon: Secondary | ICD-10-CM

## 2021-03-04 DIAGNOSIS — K635 Polyp of colon: Secondary | ICD-10-CM | POA: Diagnosis not present

## 2021-03-04 MED ORDER — SODIUM CHLORIDE 0.9 % IV SOLN: 500.0000 mL | Freq: Once | INTRAVENOUS | Status: DC

## 2021-03-04 NOTE — Progress Notes (Signed)
Report to PACU, RN, vss, BBS= Clear.  

## 2021-03-04 NOTE — Progress Notes (Signed)
Medical history reviewed with no changes noted. VS assessed by C.W 

## 2021-03-04 NOTE — Op Note (Signed)
[place] Patient Name: Jonathan [name] Procedure Date: 03/04/2021 9:47 AM MRN: [id number] Endoscopist: Thornton Park MD, MD Age: 60 Referring MD:  Date of Birth: 11-11-1961 Gender: Male Account #: [id number] Procedure:                Colonoscopy Indications:              Positive Cologuard test                           Hyperplastic polyp on colonoscopy in 2011                           Daughter has had two colon polyps (age 62) and                            appendix cancer                           No known family history of colon cancer Medicines:                Monitored Anesthesia Care Procedure:                Pre-Anesthesia Assessment:                           - Prior to the procedure, a History and Physical                            was performed, and patient medications and                            allergies were reviewed. The patient's tolerance of                            previous anesthesia was also reviewed. The risks                            and benefits of the procedure and the sedation                            options and risks were discussed with the patient.                            All questions were answered, and informed consent                            was obtained. Prior Anticoagulants: The patient has                            taken no previous anticoagulant or antiplatelet                            agents. ASA Grade Assessment: II - A patient with  mild systemic disease. After reviewing the risks                            and benefits, the patient was deemed in                            satisfactory condition to undergo the procedure.                           After obtaining informed consent, the colonoscope                            was passed under direct vision. Throughout the                            procedure, the patient's blood pressure, pulse, and                            oxygen  saturations were monitored continuously. The                            Olympus CF-HQ190L (Serial# [id number]) Colonoscope was                            introduced through the anus and advanced to the 3                            cm into the ileum. The colonoscopy was performed                            without difficulty. The patient tolerated the                            procedure well. The quality of the bowel                            preparation was good. The terminal ileum, ileocecal                            valve, appendiceal orifice, and rectum were                            photographed. Scope In: 10:02:47 AM Scope Out: 10:28:28 AM Scope Withdrawal Time: 0 hours 22 minutes 57 seconds  Total Procedure Duration: 0 hours 25 minutes 41 seconds  Findings:                 Non-bleeding internal hemorrhoids were found. The                            hemorrhoids were small.                           Multiple small and large-mouthed diverticula were  found in the sigmoid colon.                           Two sessile polyps were found in the rectum. The                            polyps were less than 1 mm in size. These polyps                            were removed with a cold biopsy forceps. Resection                            and retrieval were complete. Estimated blood loss                            was minimal.                           A 7 mm polyp was found in the rectum, 17 cm from                            the anal verge. The polyp was semi-pedunculated and                            the base had a verrucous appearance. The polyp was                            removed with a cold snare. Resection and retrieval                            were complete. Area was tattooed with an injection                            of 3 mL of Spot (carbon black).                           Four sessile polyps were found in the descending                            colon  and transverse colon. The polyps were 2 to 3                            mm in size. These polyps were removed with a cold                            snare. Resection and retrieval were complete.                            Estimated blood loss was minimal.                           Two sessile polyps were found in the hepatic  flexure. The polyps were 1 to 2 mm in size. These                            polyps were removed with a cold snare. Resection                            and retrieval were complete. Estimated blood loss                            was minimal.                           A 3 mm polyp was found in the ascending colon. The                            polyp was sessile. The polyp was removed with a                            cold snare. Resection and retrieval were complete.                            Estimated blood loss was minimal.                           A 3 mm polyp was found in the ascending colon. The                            polyp was sessile. The polyp was removed with a                            cold snare. Resection and retrieval were complete.                            Estimated blood loss was minimal.                           The exam was otherwise without abnormality on                            direct and retroflexion views. Complications:            No immediate complications. Estimated blood loss:                            Minimal. Estimated Blood Loss:     Estimated blood loss was minimal. Impression:               - Non-bleeding internal hemorrhoids.                           - Diverticulosis in the sigmoid colon.                           - Two less than 1 mm polyps in the rectum, removed  with a cold biopsy forceps. Resected and retrieved.                           - One 7 mm polyp in the rectum, removed with a cold                            snare. Resected and retrieved. Tattooed.                            - Four 2 to 3 mm polyps in the descending colon and                            in the transverse colon, removed with a cold snare.                            Resected and retrieved.                           - Two 1 to 2 mm polyps at the hepatic flexure,                            removed with a cold snare. Resected and retrieved.                           - One 3 mm polyp in the ascending colon, removed                            with a cold snare. Resected and retrieved.                           - One 3 mm polyp in the ascending colon, removed                            with a cold snare. Resected and retrieved.                           - The examination was otherwise normal on direct                            and retroflexion views. Recommendation:           - Patient has a contact number available for                            emergencies. The signs and symptoms of potential                            delayed complications were discussed with the                            patient. Return to normal activities tomorrow.  Written discharge instructions were provided to the                            patient.                           - Resume previous diet. High fiber diet encouraged.                           - Continue present medications.                           - Await pathology results.                           - Repeat colonoscopy date to be determined after                            pending pathology results are reviewed for                            surveillance.                           - Emerging evidence supports eating a diet of                            fruits, vegetables, grains, calcium, and yogurt                            while reducing red meat and alcohol may reduce the                            risk of colon cancer.                           - Given these results, all first degree relatives                             (brothers, sisters, children, parents) should start                            colon cancer screening at age 69.                           - Thank you for allowing me to be involved in your                            colon cancer prevention. Thornton Park MD, MD 03/04/2021 10:44:07 AM This report has been signed electronically.

## 2021-03-04 NOTE — Progress Notes (Signed)
Called to room to assist during endoscopic procedure.  Patient ID and intended procedure confirmed with present staff. Received instructions for my participation in the procedure from the performing physician.  

## 2021-03-04 NOTE — Patient Instructions (Signed)
All first degree relatives should begin colon cancer screening at age 62  High fiber diet encouraged   Await pathology results on polyps removed   Handouts on polyps and diverticulosis and high fiber diet given to you    YOU HAD AN ENDOSCOPIC PROCEDURE TODAY AT [place]:   Refer to the procedure report that was given to you for any specific questions about what was found during the examination.  If the procedure report does not answer your questions, please call your gastroenterologist to clarify.  If you requested that your care partner not be given the details of your procedure findings, then the procedure report has been included in a sealed envelope for you to review at your convenience later.  YOU SHOULD EXPECT: Some feelings of bloating in the abdomen. Passage of more gas than usual.  Walking can help get rid of the air that was put into your GI tract during the procedure and reduce the bloating. If you had a lower endoscopy (such as a colonoscopy or flexible sigmoidoscopy) you may notice spotting of blood in your stool or on the toilet paper. If you underwent a bowel prep for your procedure, you may not have a normal bowel movement for a few days.  Please Note:  You might notice some irritation and congestion in your nose or some drainage.  This is from the oxygen used during your procedure.  There is no need for concern and it should clear up in a day or so.  SYMPTOMS TO REPORT IMMEDIATELY:   Following lower endoscopy (colonoscopy or flexible sigmoidoscopy):  Excessive amounts of blood in the stool  Significant tenderness or worsening of abdominal pains  Swelling of the abdomen that is new, acute  Fever of 100F or higher   For urgent or emergent issues, a gastroenterologist can be reached at any hour by calling [phone]. Do not use MyChart messaging for urgent concerns.    DIET:  We do recommend a small meal at first, but then you may proceed to  your regular diet.  Drink plenty of fluids but you should avoid alcoholic beverages for 24 hours.  ACTIVITY:  You should plan to take it easy for the rest of today and you should NOT DRIVE or use heavy machinery until tomorrow (because of the sedation medicines used during the test).    FOLLOW UP: Our staff will call the number listed on your records 48-72 hours following your procedure to check on you and address any questions or concerns that you may have regarding the information given to you following your procedure. If we do not reach you, we will leave a message.  We will attempt to reach you two times.  During this call, we will ask if you have developed any symptoms of COVID 19. If you develop any symptoms (ie: fever, flu-like symptoms, shortness of breath, cough etc.) before then, please call [phone].  If you test positive for Covid 19 in the 2 weeks post procedure, please call and report this information to [place].    If any biopsies were taken you will be contacted by phone or by letter within the next 1-3 weeks.  Please call us at [phone] if you have not heard about the biopsies in 3 weeks.    SIGNATURES/CONFIDENTIALITY: You and/or your care partner have signed paperwork which will be entered into your electronic medical record.  These signatures attest to the fact that that the information above on your  After Visit Summary has been reviewed and is understood.  Full responsibility of the confidentiality of this discharge information lies with you and/or your care-partner.

## 2021-03-08 ENCOUNTER — Telehealth: Payer: Self-pay | Admitting: *Deleted

## 2021-03-08 NOTE — Telephone Encounter (Signed)
  Follow up Call-  Call back number 03/04/2021  Post procedure Call Back phone  # [phone]  Permission to leave phone message Yes  Some recent data might be hidden     Patient questions:  Do you have a fever, pain , or abdominal swelling? No. Pain Score  0 *  Have you tolerated food without any problems? Yes.    Have you been able to return to your normal activities? Yes.    Do you have any questions about your discharge instructions: Diet   No. Medications  No. Follow up visit  No.  Do you have questions or concerns about your Care? No.  Actions: * If pain score is 4 or above: No action needed, pain <4.  1. Have you developed a fever since your procedure? no  2.   Have you had an respiratory symptoms (SOB or cough) since your procedure? no  3.   Have you tested positive for COVID 19 since your procedure no  4.   Have you had any family members/close contacts diagnosed with the COVID 19 since your procedure?  no   If yes to any of these questions please route to Joylene John, RN and Joella Prince, RN

## 2021-03-15 ENCOUNTER — Telehealth: Payer: Self-pay | Admitting: Gastroenterology

## 2021-03-15 NOTE — Telephone Encounter (Signed)
The pt has been advised that path results have not been reviewed as of today. He is aware the Dr Tarri Glenn is out of the office this week and we will contact him as soon as she has reviewed.

## 2021-03-15 NOTE — Telephone Encounter (Signed)
Patient calling for path results 

## 2021-03-16 ENCOUNTER — Encounter: Payer: Self-pay | Admitting: Gastroenterology

## 2021-03-17 ENCOUNTER — Ambulatory Visit (INDEPENDENT_AMBULATORY_CARE_PROVIDER_SITE_OTHER): Payer: BC Managed Care – PPO | Admitting: Psychology

## 2021-03-17 DIAGNOSIS — F4323 Adjustment disorder with mixed anxiety and depressed mood: Secondary | ICD-10-CM | POA: Diagnosis not present

## 2021-03-17 DIAGNOSIS — F330 Major depressive disorder, recurrent, mild: Secondary | ICD-10-CM

## 2021-03-17 DIAGNOSIS — F900 Attention-deficit hyperactivity disorder, predominantly inattentive type: Secondary | ICD-10-CM

## 2021-03-20 ENCOUNTER — Other Ambulatory Visit: Payer: Self-pay | Admitting: Psychiatry

## 2021-03-20 DIAGNOSIS — F330 Major depressive disorder, recurrent, mild: Secondary | ICD-10-CM

## 2021-03-28 ENCOUNTER — Other Ambulatory Visit: Payer: Self-pay | Admitting: Psychiatry

## 2021-03-28 DIAGNOSIS — F332 Major depressive disorder, recurrent severe without psychotic features: Secondary | ICD-10-CM

## 2021-03-29 ENCOUNTER — Ambulatory Visit (INDEPENDENT_AMBULATORY_CARE_PROVIDER_SITE_OTHER): Payer: BC Managed Care – PPO | Admitting: Psychology

## 2021-03-29 DIAGNOSIS — F900 Attention-deficit hyperactivity disorder, predominantly inattentive type: Secondary | ICD-10-CM | POA: Diagnosis not present

## 2021-03-29 DIAGNOSIS — F330 Major depressive disorder, recurrent, mild: Secondary | ICD-10-CM

## 2021-03-29 DIAGNOSIS — F4323 Adjustment disorder with mixed anxiety and depressed mood: Secondary | ICD-10-CM | POA: Diagnosis not present

## 2021-04-05 ENCOUNTER — Encounter: Payer: Self-pay | Admitting: Psychiatry

## 2021-04-05 ENCOUNTER — Ambulatory Visit (INDEPENDENT_AMBULATORY_CARE_PROVIDER_SITE_OTHER): Payer: BC Managed Care – PPO | Admitting: Psychiatry

## 2021-04-05 ENCOUNTER — Other Ambulatory Visit: Payer: Self-pay

## 2021-04-05 DIAGNOSIS — F330 Major depressive disorder, recurrent, mild: Secondary | ICD-10-CM | POA: Diagnosis not present

## 2021-04-05 MED ORDER — DESVENLAFAXINE SUCCINATE ER 25 MG PO TB24: 25.0000 mg | ORAL_TABLET | Freq: Every morning | ORAL | 0 refills | Status: DC

## 2021-04-05 NOTE — Progress Notes (Signed)
Jonathan [name] [id number] 1961-01-14 60 y.o.  Subjective:   Patient ID:  Jonathan [name] is a 60 y.o. (DOB [birth year]) male.  Chief Complaint:  Chief Complaint  Patient presents with  . Follow-up    H/o depression and anxiety    HPI Jonathan [name] presents to the office today for follow-up of depression and anxiety. "For the most part I am feeling better with the increase of Pristiq." He reports that there have been a few incidents when he has had multiple things to do he will become overwhelmed. He reports that he has difficulty processing when overwhelmed to the point that he did not feel safe driving and asked his wife to drive Thursday and felt on the verge of panic. He then fell asleep in the car, which is not typical for him. He reports that "most of the time" he feels well. He reports "when everything happens at once" he feels overwhelmed and has anxiety. He reports that he seems to get overwhelmed when he feels responsible for others, particularly family.  Wife has commented that this was not typical behavior for him. He reports that he does not recall physical s/s of anxiety. He reports that he was unable to talk during this episode.   He reports that for most of the time he feels calm. He reports that episode Thursday was different from anxiety and panic he has experienced in the past. He recalls scratching repeatedly at his arm. He reports that Thursday morning they got a call that seeking a second opinion for their daughter had reached a dead end. He reports that there was another event where he felt that panic could have occurred around [place] when there were multiple responsibilities. He has seen "minimal" improvements with higher dose of Pristiq. He reports feeling more hopeful and energetic overall. Denies depressed mood. Energy has been ok. Motivation has been ok. Sleep has been adequate and reports that he does not feel sleep has been restorative. Appetite has been less with  Pristiq. Denies SI.  He reports that attention and focus is unchanged. He reports that he is able to complete what he needs to. He continues to be unable to sit and read. 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   Never had a sleep [name]. He denies anyone telling him that he snores or gasps in his sleep. Denies excessive daytime somnolence.   He just finished volunteering for [employer].   Past Psychiatric Medication Trials: Citalopram- Sexual side effects, fatigue. Partially effective for depression. Increased to 15 mg in the last month and noticed more fatigue. He reports that he has been taking 10 mg most of the time since then. Trintellix- Improved mood, anxiety, and focus on 5 mg. Excessive somnolence on 10 mg. Pristiq Wellbutrin- Took 25 years ago.Cannot recall response.   [place] Office Visit from 10/19/2020 in [place] at [facility] from 08/02/2019 in [place] at [facility] from 10/01/2018 in [place] at [facility] from 05/24/2017 in [place] at [facility] Total Score 0 4 0 0  PHQ-9 Total Score 9 15 -- --       Review of Systems:  Review of Systems  Musculoskeletal: Negative for gait problem.  Neurological: Negative for tremors and headaches.  Psychiatric/Behavioral:       Please refer to HPI    Medications: I have reviewed  the patient's current medications.  Current Outpatient Medications  Medication Sig Dispense Refill  . B Complex Vitamins (B COMPLEX 1 PO) Take by mouth.    . Desvenlafaxine Succinate ER 25 MG TB24 Take 25 mg by mouth in the morning. 90 tablet 0  . ferrous sulfate 325 (65 FE) MG tablet Take by mouth daily with breakfast. (Patient not taking: Reported on 03/04/2021)    . loratadine (CLARITIN) 10 MG tablet Take 10 mg by mouth every other day.    . losartan-hydrochlorothiazide  (HYZAAR) 50-12.5 MG tablet TAKE 2 TABLETS BY MOUTH EVERY DAY 180 tablet 3  . metFORMIN (GLUCOPHAGE-XR) 500 MG 24 hr tablet TAKE 1 TABLET BY MOUTH TWICE A DAY 180 tablet 3  . Multiple Vitamin (MULTIVITAMIN) LIQD Take 5 mLs by mouth daily.    . Probiotic Product (PROBIOTIC PO) Take 1 tablet by mouth every other day.     No current facility-administered medications for this visit.    Medication Side Effects: Other: Possible increased anxiety  Allergies:  Allergies  Allergen Reactions  . Zocor [Simvastatin]     ? myalgias  . Penicillins Hives    Per pt - happened in childhood - hives possible reaction    Past Medical History:  Diagnosis Date  . Allergic rhinitis   . Allergy   . Anxiety   . Depression   . Diabetes mellitus without complication ([place])    type 2  . Heart murmur    per pt told it was functional  . History of kidney stones   . History of nephrolithiasis   . Hyperlipidemia   . Hypertension   . Sebaceous cyst     Past Medical History, Surgical history, Social history, and Family history were reviewed and updated as appropriate.   Please see review of systems for further details on the patient's review from today.   Objective:   Physical Exam:  There were no vitals taken for this visit.  Physical Exam Constitutional:      General: He is not in acute distress. Musculoskeletal:        General: No deformity.  Neurological:     Mental Status: He is alert and oriented to person, place, and time.     Coordination: Coordination normal.  Psychiatric:        Attention and Perception: Attention and perception normal. He does not perceive auditory or visual hallucinations.        Mood and Affect: Mood normal. Mood is not anxious or depressed. Affect is not labile, blunt, angry or inappropriate.        Speech: Speech normal.        Behavior: Behavior normal.        Thought Content: Thought content normal. Thought content is not paranoid or delusional. Thought content  does not include homicidal or suicidal ideation. Thought content does not include homicidal or suicidal plan.        Cognition and Memory: Cognition and memory normal.        Judgment: Judgment normal.     Comments: Insight intact     Lab Review:     Component Value Date/Time   NA 140 10/19/2020 1206   K 3.7 10/19/2020 1206   CL 101 10/19/2020 1206   CO2 26 10/19/2020 1206   GLUCOSE 105 (H) 10/19/2020 1206   BUN 16 10/19/2020 1206   CREATININE 1.15 10/19/2020 1206   CALCIUM 9.9 10/19/2020 1206   PROT 6.5 10/19/2020 1206   ALBUMIN 3.9 01/05/2020 1014  AST 20 10/19/2020 1206   ALT 31 10/19/2020 1206   ALKPHOS 54 01/05/2020 1014   BILITOT 0.7 10/19/2020 1206   GFRNONAA 68 10/19/2020 1206   GFRAA 79 10/19/2020 1206       Component Value Date/Time   WBC 10.1 10/19/2020 1206   RBC 5.56 10/19/2020 1206   HGB 17.2 (H) 10/19/2020 1206   HCT 49.2 10/19/2020 1206   PLT 250 10/19/2020 1206   MCV 88.5 10/19/2020 1206   MCV 94.2 04/29/2013 0936   MCH 30.9 10/19/2020 1206   MCHC 35.0 10/19/2020 1206   RDW 12.5 10/19/2020 1206   LYMPHSABS 3,485 10/19/2020 1206   MONOABS 0.9 09/05/2018 0754   EOSABS 313 10/19/2020 1206   BASOSABS 81 10/19/2020 1206    No results found for: POCLITH, LITHIUM   No results found for: PHENYTOIN, PHENOBARB, VALPROATE, CBMZ   .res Assessment: Plan:   Pt seen for 30 minutes and time spent discussing decrease in Pristiq to 25 mg daily since higher dose may have precipitated recent episode of acute anxiety and there have been no significant improvements with 50 mg dose compared to 25 mg dose.  Pt also plans to discuss recent episode of acute anxiety with therapist. Discussed considering initation of Buspar in the future if he continues to experience anxiety since it is indicated for anxiety and studies suggest it may also be helpful for concentration. Also discussed considering treatment for ADHD if impaired concentration persists.  Pt to follow up in 6  weeks or sooner if clinically indicated.  Patient advised to contact office with any questions, adverse effects, or acute worsening in signs and symptoms.   Jonathan [name] was seen today for follow-up.  Diagnoses and all orders for this visit:  Mild episode of recurrent major depressive disorder (HCC) -     Desvenlafaxine Succinate ER 25 MG TB24; Take 25 mg by mouth in the morning.     Please see After Visit Summary for patient specific instructions.  Future Appointments  Date Time Provider [place]  04/22/2021  9:00 AM Cottle, Lucious Groves, LCSW [facility] None  05/18/2021  1:00 PM Thayer Headings, PMHNP [facility] None    No orders of the defined types were placed in this encounter.   -------------------------------

## 2021-04-06 ENCOUNTER — Telehealth: Payer: Self-pay | Admitting: Psychiatry

## 2021-04-06 DIAGNOSIS — F330 Major depressive disorder, recurrent, mild: Secondary | ICD-10-CM

## 2021-04-06 MED ORDER — DESVENLAFAXINE SUCCINATE ER 25 MG PO TB24: 25.0000 mg | ORAL_TABLET | Freq: Every morning | ORAL | 0 refills | Status: DC

## 2021-04-06 NOTE — Telephone Encounter (Signed)
Please let him know Desvenlafaxine was changed to a 60 day supply.

## 2021-04-06 NOTE — Telephone Encounter (Signed)
Please review

## 2021-04-06 NOTE — Telephone Encounter (Signed)
Pt informed

## 2021-04-06 NOTE — Telephone Encounter (Signed)
Pt would like to see is he can get a 60 say supply of Desvenlafaxine Succinate 25mg  sent in instead of the 90day. Pt is going to try to stop taking and will discuss at next visit. [facility] in [facility] [address].

## 2021-04-08 DIAGNOSIS — Z20822 Contact with and (suspected) exposure to covid-19: Secondary | ICD-10-CM | POA: Diagnosis not present

## 2021-04-08 DIAGNOSIS — U071 COVID-19: Secondary | ICD-10-CM | POA: Diagnosis not present

## 2021-04-09 DIAGNOSIS — R059 Cough, unspecified: Secondary | ICD-10-CM | POA: Diagnosis not present

## 2021-04-09 DIAGNOSIS — U071 COVID-19: Secondary | ICD-10-CM | POA: Diagnosis not present

## 2021-04-22 ENCOUNTER — Ambulatory Visit: Payer: BC Managed Care – PPO | Admitting: Psychology

## 2021-04-22 ENCOUNTER — Other Ambulatory Visit: Payer: Self-pay

## 2021-04-22 DIAGNOSIS — F330 Major depressive disorder, recurrent, mild: Secondary | ICD-10-CM

## 2021-04-22 DIAGNOSIS — F4323 Adjustment disorder with mixed anxiety and depressed mood: Secondary | ICD-10-CM | POA: Diagnosis not present

## 2021-04-22 DIAGNOSIS — F900 Attention-deficit hyperactivity disorder, predominantly inattentive type: Secondary | ICD-10-CM

## 2021-05-04 NOTE — Progress Notes (Signed)
Chief Complaint  Patient presents with  . Mass    Patient complains of bump under skin, Patient reports the bump is located behind left ear, x1 week     HPI: Jonathan [name] 60 y.o. come in for check of lump on left neck side  That has  Noted over week but no tenderness redness or systemic  Sx Remote hx of  A time  DC odiferous in past .  wants to make sure note serious  Had covid last month  Recovered .  Mild mod sx.   Has only been taking metformin 500 per day cause of forgetting  To take  Many  Issues of recent medical issues in family.  Wife just had  Urgent NS  For back  Predicament.   Had colon with multiple polyps  Had pos cologuard   ROS: See pertinent positives and negatives per HPI.  Past Medical History:  Diagnosis Date  . Allergic rhinitis   . Allergy   . Anxiety   . Depression   . Diabetes mellitus without complication ([place])    type 2  . Heart murmur    per pt told it was functional  . History of kidney stones   . History of nephrolithiasis   . Hyperlipidemia   . Hypertension   . Sebaceous cyst     Family History  Problem Relation Age of Onset  . Diabetes Father   . Hypertension Father   . Kidney cancer Father        now on dialysis  . Kidney Stones Father   . Kidney Stones Brother   . Huntington's disease Maternal Grandfather   . Depression Daughter   . Anxiety disorder Daughter   . ADD / ADHD Daughter   . Colon polyps Daughter 52  . Colon cancer Neg Hx   . Esophageal cancer Neg Hx   . Stomach cancer Neg Hx   . Rectal cancer Neg Hx     Social History   Socioeconomic History  . Marital status: Married    Spouse name: Not on file  . Number of children: 1  . Years of education: Not on file  . Highest education level: Not on file  Occupational History  . Occupation: MINISTER    Employer: [employer]  Tobacco Use  . Smoking status: Never Smoker  . Smokeless tobacco: Never Used  Vaping Use  . Vaping Use: Never used   Substance and Sexual Activity  . Alcohol use: Yes    Comment: less than 1 a week  . Drug use: No  . Sexual activity: Yes  Other Topics Concern  . Not on file  Social History Narrative   Married   Regular exercise- no   HH of 3   Dog car and lizard   Sleep wakening at times   New job [employer]   Per week reg sleep.    Social Determinants of Health   Financial Resource Strain: Not on file  Food Insecurity: Not on file  Transportation Needs: Not on file  Physical Activity: Not on file  Stress: Not on file  Social Connections: Not on file    Outpatient Medications Prior to Visit  Medication Sig Dispense Refill  . B Complex Vitamins (B COMPLEX 1 PO) Take by mouth.    . Desvenlafaxine Succinate ER 25 MG TB24 Take 25 mg by mouth in the morning. 60 tablet 0  . loratadine (CLARITIN) 10 MG tablet Take 10 mg by  mouth every other day.    . losartan-hydrochlorothiazide (HYZAAR) 50-12.5 MG tablet TAKE 2 TABLETS BY MOUTH EVERY DAY 180 tablet 3  . metFORMIN (GLUCOPHAGE-XR) 500 MG 24 hr tablet TAKE 1 TABLET BY MOUTH TWICE A DAY (Patient taking differently: daily with breakfast.) 180 tablet 3  . Multiple Vitamin (MULTIVITAMIN) LIQD Take 5 mLs by mouth daily.    . Probiotic Product (PROBIOTIC PO) Take 1 tablet by mouth every other day.    . ferrous sulfate 325 (65 FE) MG tablet Take by mouth daily with breakfast.     No facility-administered medications prior to visit.     EXAM:  BP 126/68 (BP Location: Left Arm, Patient Position: Sitting, Cuff Size: Normal)   Pulse 69   Temp 97.9 F (36.6 C) (Oral)   Ht 5\' 8"  (1.727 m)   Wt 212 lb 3.2 oz (96.3 kg)   SpO2 96%   BMI 32.26 kg/m   Body mass index is 32.26 kg/m.  GENERAL: vitals reviewed and listed above, alert, oriented, appears well hydrated and in no acute distress HEENT: atraumatic, conjunctiva  clear, no obvious abnormalities on inspection of external nose and ears OP :masked NECK: no obvious masses on inspection  palpation  Left lateral below ear  1+ cm Sargent nodule with no redness or  Warmth.  No adenopathy LUNGS: clear to auscultation bilaterally, no wheezes, rales or rhonchi, good air movement CV: HRRR, no clubbing cyanosis or  peripheral edema nl cap refill  MS: moves all extremities without noticeable focal  abnormality PSYCH: pleasant and cooperative, no obvious depression or anxiety Lab Results  Component Value Date   WBC 10.1 10/19/2020   HGB 17.2 (H) 10/19/2020   HCT 49.2 10/19/2020   PLT 250 10/19/2020   GLUCOSE 105 (H) 10/19/2020   CHOL 258 (H) 10/19/2020   TRIG 198 (H) 10/19/2020   HDL 52 10/19/2020   LDLDIRECT 190.2 12/11/2013   LDLCALC 170 (H) 10/19/2020   ALT 31 10/19/2020   AST 20 10/19/2020   NA 140 10/19/2020   K 3.7 10/19/2020   CL 101 10/19/2020   CREATININE 1.15 10/19/2020   BUN 16 10/19/2020   CO2 26 10/19/2020   TSH 0.82 10/19/2020   PSA 0.81 10/19/2020   HGBA1C 7.0 (A) 05/05/2021   MICROALBUR 0.8 10/19/2020   BP Readings from Last 3 Encounters:  05/05/21 126/68  03/04/21 108/72  10/19/20 (!) 146/92    ASSESSMENT AND PLAN:  Discussed the following assessment and plan:  Diabetes mellitus with coincident hypertension ([place]) - Plan: POCT glycosylated hemoglobin (Hb A1C)  Skin cyst  Medication management  Polyp of colon, unspecified part of colon, unspecified type Reassurance  No adenopathy of significance   Expectant management. If  Redness increase other . No intervention needed at this time.  DM not ass good control  Will  Work on taking metformin 1000 per day  And plan fu in 3-4 months   Patient agrees  To better adherence . -Patient advised to return or notify health care team  if  new concerns arise. In interim   Patient Instructions  Lump  is not a lymph node   But a skin cyst  That sometimes can get inflamed infected   Sometimes needs draining or other procedure removal to improve.  Warm compresses if getting  Painful and red    a1c is up yo 7  from 6.5  Please  Take metformin twice a day   Can try at one tome but could cause  stomach upset.   Plan fu in 3-4 months to recheck glucose and any other issues .      Standley Brooking. Deyvi Bonanno M.D.

## 2021-05-05 ENCOUNTER — Other Ambulatory Visit: Payer: Self-pay

## 2021-05-05 ENCOUNTER — Encounter: Payer: Self-pay | Admitting: Internal Medicine

## 2021-05-05 ENCOUNTER — Ambulatory Visit: Payer: BC Managed Care – PPO | Admitting: Internal Medicine

## 2021-05-05 VITALS — BP 126/68 | HR 69 | Temp 97.9°F | Ht 68.0 in | Wt 212.2 lb

## 2021-05-05 DIAGNOSIS — K635 Polyp of colon: Secondary | ICD-10-CM

## 2021-05-05 DIAGNOSIS — E119 Type 2 diabetes mellitus without complications: Secondary | ICD-10-CM

## 2021-05-05 DIAGNOSIS — L729 Follicular cyst of the skin and subcutaneous tissue, unspecified: Secondary | ICD-10-CM

## 2021-05-05 DIAGNOSIS — Z79899 Other long term (current) drug therapy: Secondary | ICD-10-CM

## 2021-05-05 DIAGNOSIS — I10 Essential (primary) hypertension: Secondary | ICD-10-CM

## 2021-05-05 LAB — POCT GLYCOSYLATED HEMOGLOBIN (HGB A1C): Hemoglobin A1C: 7 % — AB (ref 4.0–5.6)

## 2021-05-05 NOTE — Patient Instructions (Addendum)
Lump  is not a lymph node   But a skin cyst  That sometimes can get inflamed infected   Sometimes needs draining or other procedure removal to improve.  Warm compresses if getting  Painful and red    a1c is up yo 7 from 6.5  Please  Take metformin twice a day   Can try at one tome but could cause stomach upset.   Plan fu in 3-4 months to recheck glucose and any other issues .

## 2021-05-11 ENCOUNTER — Ambulatory Visit (INDEPENDENT_AMBULATORY_CARE_PROVIDER_SITE_OTHER): Payer: BC Managed Care – PPO | Admitting: Psychology

## 2021-05-11 ENCOUNTER — Other Ambulatory Visit: Payer: Self-pay

## 2021-05-11 DIAGNOSIS — F4323 Adjustment disorder with mixed anxiety and depressed mood: Secondary | ICD-10-CM

## 2021-05-11 DIAGNOSIS — F900 Attention-deficit hyperactivity disorder, predominantly inattentive type: Secondary | ICD-10-CM | POA: Diagnosis not present

## 2021-05-11 DIAGNOSIS — F330 Major depressive disorder, recurrent, mild: Secondary | ICD-10-CM | POA: Diagnosis not present

## 2021-05-18 ENCOUNTER — Ambulatory Visit: Payer: BC Managed Care – PPO | Admitting: Psychiatry

## 2021-05-18 ENCOUNTER — Telehealth: Payer: Self-pay | Admitting: Psychiatry

## 2021-05-18 NOTE — Telephone Encounter (Signed)
Next visit is 05/25/21. Jonathan [name] needs a refill on his Desvenlafaxine ER 25 mg called to    [id number] - [place], [place]   Phone: [phone]  Fax: [phone]    He said he only needs a weeks worth to last him until his appt with Janett Billow on 05/25/21.

## 2021-05-18 NOTE — Telephone Encounter (Signed)
Next visit is 05/25/21. Jonathan [name] needs a refill on his Desvenlafaxine ER 25 mg called to   [id number] - [place], [place]  Phone:  [phone]  Fax:  [phone]   He said he only needs a weeks worth to last him until his appt with Janett Billow on 05/25/21.

## 2021-05-19 ENCOUNTER — Other Ambulatory Visit: Payer: Self-pay

## 2021-05-19 DIAGNOSIS — F330 Major depressive disorder, recurrent, mild: Secondary | ICD-10-CM

## 2021-05-19 MED ORDER — DESVENLAFAXINE SUCCINATE ER 25 MG PO TB24: 25.0000 mg | ORAL_TABLET | Freq: Every morning | ORAL | 1 refills | Status: DC

## 2021-05-19 NOTE — Telephone Encounter (Signed)
Pended.

## 2021-05-21 ENCOUNTER — Other Ambulatory Visit: Payer: Self-pay

## 2021-05-21 DIAGNOSIS — F330 Major depressive disorder, recurrent, mild: Secondary | ICD-10-CM

## 2021-05-25 ENCOUNTER — Other Ambulatory Visit: Payer: Self-pay

## 2021-05-25 ENCOUNTER — Encounter: Payer: Self-pay | Admitting: Psychiatry

## 2021-05-25 ENCOUNTER — Ambulatory Visit: Payer: BC Managed Care – PPO | Admitting: Psychiatry

## 2021-05-25 VITALS — BP 133/80 | HR 91

## 2021-05-25 DIAGNOSIS — F902 Attention-deficit hyperactivity disorder, combined type: Secondary | ICD-10-CM

## 2021-05-25 MED ORDER — METHYLPHENIDATE HCL ER 18 MG PO TB24: 18.0000 mg | ORAL_TABLET | Freq: Every morning | ORAL | 0 refills | Status: DC

## 2021-05-25 NOTE — Progress Notes (Signed)
Jonathan [name] [id number] [birth year] 60 y.o.  Subjective:   Patient ID:  Jonathan [name] is a 60 y.o. (DOB 1961/05/16) male.  Chief Complaint:  Chief Complaint  Patient presents with   ADD     HPI Jonathan [name] presents to the office today for follow-up of ADD, anxiety, and depression. He reports that his wife herniated a disc and had surgery 3 weeks ago and is out of work since July 11th. Wife has been recovering well. Learned daughter will not require any additional surgery for cancer.  He reports that he ran out of 25 mg tablets and started to decrease Pristiq to 50 mg every other day. He reports adverse reactions with Pristiq. He reports that he can feel when extended release "kicks in" and will have a cold sweat. He reports that he has had anxiety with Pristiq and having very high expectations of himself and being responsible for everything. He reports that he has had a few episodes of increased anxiety and feeling "mad." He reports that his family noticed a negative change in his mood and behavior while he was on Pristiq. Forgot to take Pristiq yesterday and went 2 days without it.   He reports that depression has been improved and attributes this to his entire family doing better. Sleeping well. Reports sleep has been better the last few weeks. Appetite went away for awhile and has returned. He reports that he is now having to be more conscious about what he eats. He reports difficulty with concentration and focus. Denies SI.   He requests medication to help with ADHD since he feels that these s/s seem to be triggering other s/s. "I am more convinced that this is something I have been dealing with my entire life."  Leaves for youth conference July 24th.   Past Psychiatric Medication Trials: Citalopram- Sexual side effects, fatigue. Partially effective for depression. Increased to 15 mg in the last month and noticed more fatigue. He reports that he has been taking 10 mg most  of the time since then.  Trintellix- Improved mood, anxiety, and focus on 5 mg. Excessive somnolence on 10 mg. Pristiq Wellbutrin- Took 25 years ago. Cannot recall response.  [place] Office Visit from 05/05/2021 in [place] at [facility] from 10/19/2020 in [place] at [facility] from 08/02/2019 in [place] at [facility] from 10/01/2018 in [place] at [facility] from 05/24/2017 in [place] at [facility] Total Score 2 0 4 0 0  PHQ-9 Total Score 12 9 15  -- --        Review of Systems:  Review of Systems  Musculoskeletal:  Negative for gait problem.  Neurological:  Negative for tremors and headaches.  Psychiatric/Behavioral:         Please refer to HPI   Medications: I have reviewed the patient's current medications.  Current Outpatient Medications  Medication Sig Dispense Refill   B Complex Vitamins (B COMPLEX 1 PO) Take by mouth.     loratadine (CLARITIN) 10 MG tablet Take 10 mg by mouth every other day.     losartan-hydrochlorothiazide (HYZAAR) 50-12.5 MG tablet TAKE 2 TABLETS BY MOUTH EVERY DAY 180 tablet 3   metFORMIN (GLUCOPHAGE-XR) 500 MG 24 hr tablet TAKE 1 TABLET BY MOUTH TWICE A DAY 180 tablet 3   methylphenidate 18 MG PO CR tablet Take 1 tablet (18 mg total) by mouth in the morning. 7 tablet 0  Multiple Vitamin (MULTIVITAMIN) LIQD Take 5 mLs by mouth daily.     Probiotic Product (PROBIOTIC PO) Take 1 tablet by mouth every other day.     No current facility-administered medications for this visit.    Medication Side Effects: Other: Increased anxiety  Allergies:  Allergies  Allergen Reactions   Zocor [Simvastatin]     ? myalgias   Penicillins Hives    Per pt - happened in childhood - hives possible reaction    Past Medical History:  Diagnosis Date   Allergic rhinitis    Allergy    Anxiety    Depression    Diabetes mellitus without  complication ([facility])    type 2   Heart murmur    per pt told it was functional   History of kidney stones    History of nephrolithiasis    Hyperlipidemia    Hypertension    Sebaceous cyst     Past Medical History, Surgical history, Social history, and Family history were reviewed and updated as appropriate.   Please see review of systems for further details on the patient's review from today.   Objective:   Physical Exam:  BP 133/80   Pulse 91   Physical Exam Constitutional:      General: He is not in acute distress. Musculoskeletal:        General: No deformity.  Neurological:     Mental Status: He is alert and oriented to person, place, and time.     Coordination: Coordination normal.  Psychiatric:        Attention and Perception: Attention and perception normal. He does not perceive auditory or visual hallucinations.        Mood and Affect: Mood is anxious. Mood is not depressed. Affect is not labile, blunt, angry or inappropriate.        Speech: Speech normal.        Behavior: Behavior normal.        Thought Content: Thought content normal. Thought content is not paranoid or delusional. Thought content does not include homicidal or suicidal ideation. Thought content does not include homicidal or suicidal plan.        Cognition and Memory: Cognition and memory normal.        Judgment: Judgment normal.     Comments: Insight intact    Lab Review:     Component Value Date/Time   NA 140 10/19/2020 1206   K 3.7 10/19/2020 1206   CL 101 10/19/2020 1206   CO2 26 10/19/2020 1206   GLUCOSE 105 (H) 10/19/2020 1206   BUN 16 10/19/2020 1206   CREATININE 1.15 10/19/2020 1206   CALCIUM 9.9 10/19/2020 1206   PROT 6.5 10/19/2020 1206   ALBUMIN 3.9 01/05/2020 1014   AST 20 10/19/2020 1206   ALT 31 10/19/2020 1206   ALKPHOS 54 01/05/2020 1014   BILITOT 0.7 10/19/2020 1206   GFRNONAA 68 10/19/2020 1206   GFRAA 79 10/19/2020 1206       Component Value Date/Time   WBC 10.1  10/19/2020 1206   RBC 5.56 10/19/2020 1206   HGB 17.2 (H) 10/19/2020 1206   HCT 49.2 10/19/2020 1206   PLT 250 10/19/2020 1206   MCV 88.5 10/19/2020 1206   MCV 94.2 04/29/2013 0936   MCH 30.9 10/19/2020 1206   MCHC 35.0 10/19/2020 1206   RDW 12.5 10/19/2020 1206   LYMPHSABS 3,485 10/19/2020 1206   MONOABS 0.9 09/05/2018 0754   EOSABS 313 10/19/2020 1206   BASOSABS 81 10/19/2020  1206    No results found for: POCLITH, LITHIUM   No results found for: PHENYTOIN, PHENOBARB, VALPROATE, CBMZ   .res Assessment: Plan:   Patient seen for 30 minutes and time spent discussing treatment plan.  Agreed that Pristiq seems to be causing increased activation in the form of anxiety and occasional irritability. Will stop Pristiq due to adverse effects. Discussed treatment of attention deficit signs and symptoms since patient reports that these symptoms remain his chief complaint and feels that ADHD signs and symptoms are underlying cause/contributors to episodic depression and anxiety. Discussed potential benefits, risks, and side effects of stimulants with patient to include increased heart rate, palpitations, insomnia, increased anxiety, increased irritability, or decreased appetite.  Instructed patient to contact office if experiencing any significant tolerability issues. Discussed starting with very low dose stimulant to minimize risk of activation and then adjust dose based on response and tolerability. Will start Concerta 18 mg daily in the morning for attention deficit disorder.  Will send one week supply so that dose can be titrated in 1 week if minimally effective. Patient to follow-up in 6 weeks or sooner if clinically indicated. Patient advised to contact office with any questions, adverse effects, or acute worsening in signs and symptoms.    [place] was seen today for add.  Diagnoses and all orders for this visit:  Attention deficit hyperactivity disorder (ADHD), combined type -      methylphenidate 18 MG PO CR tablet; Take 1 tablet (18 mg total) by mouth in the morning.    Please see After Visit Summary for patient specific instructions.  Future Appointments  Date Time Provider [place]  06/01/2021  1:00 PM Cottle, Lucious Groves, LCSW [id number] None  06/02/2021 11:30 AM Panosh, Standley Brooking, MD [id number]  07/06/2021  9:00 AM Thayer Headings, PMHNP [facility] None    No orders of the defined types were placed in this encounter.   -------------------------------

## 2021-05-31 MED ORDER — METHYLPHENIDATE HCL ER (OSM) 27 MG PO TBCR: 27.0000 mg | EXTENDED_RELEASE_TABLET | ORAL | 0 refills | Status: DC

## 2021-06-01 ENCOUNTER — Other Ambulatory Visit: Payer: Self-pay

## 2021-06-01 ENCOUNTER — Ambulatory Visit: Payer: BC Managed Care – PPO | Admitting: Psychology

## 2021-06-01 NOTE — Progress Notes (Signed)
Chief Complaint  Patient presents with   Hernia    Patient complains of a hernia, Patient states there is no pain just discomfort, x2 years, Patient reports feeling bloated    HPI: Jonathan [name] 60 y.o. come in for follow-up of a small umbilical hernia we had discussed for a while. It is beginning to bother him more recently is sore tender to touch notices it when he has to bend over and lift things. Would like to proceed with referral. He is continuing to lose weight in a healthy manner walking dietary changes and trying to take his metformin twice daily to remember.  Feels pretty good with that. Sugars are coming down somewhere 98 range.  ROS: See pertinent positives and negatives per HPI. Simulate medicine has helped his mood helped his organizational thoughts ADD Past Medical History:  Diagnosis Date   Allergic rhinitis    Allergy    Anxiety    Depression    Diabetes mellitus without complication (HCC)    type 2   Heart murmur    per pt told it was functional   History of kidney stones    History of nephrolithiasis    Hyperlipidemia    Hypertension    Sebaceous cyst     Family History  Problem Relation Age of Onset   Diabetes Father    Hypertension Father    Kidney cancer Father        now on dialysis   Kidney Stones Father    Kidney Stones Brother    Huntington's disease Maternal Grandfather    Depression Daughter    Anxiety disorder Daughter    ADD / ADHD Daughter    Colon polyps Daughter 32   Colon cancer Neg Hx    Esophageal cancer Neg Hx    Stomach cancer Neg Hx    Rectal cancer Neg Hx     Social History   Socioeconomic History   Marital status: Married    Spouse name: Not on file   Number of children: 1   Years of education: Not on file   Highest education level: Not on file  Occupational History   Occupation: MINISTER    Employer: [employer]  Tobacco Use   Smoking status: Never   Smokeless tobacco: Never  Vaping Use    Vaping Use: Never used  Substance and Sexual Activity   Alcohol use: Yes    Comment: less than 1 a week   Drug use: No   Sexual activity: Yes  Other Topics Concern   Not on file  Social History Narrative   Married   Regular exercise- no   HH of 3   Dog car and lizard   Sleep wakening at times   New job [employer]   Per week reg sleep.    Social Determinants of Health   Financial Resource Strain: Not on file  Food Insecurity: Not on file  Transportation Needs: Not on file  Physical Activity: Not on file  Stress: Not on file  Social Connections: Not on file    Outpatient Medications Prior to Visit  Medication Sig Dispense Refill   loratadine (CLARITIN) 10 MG tablet Take 10 mg by mouth every other day.     losartan-hydrochlorothiazide (HYZAAR) 50-12.5 MG tablet TAKE 2 TABLETS BY MOUTH EVERY DAY 180 tablet 3   metFORMIN (GLUCOPHAGE-XR) 500 MG 24 hr tablet TAKE 1 TABLET BY MOUTH TWICE A DAY 180 tablet 3   methylphenidate (CONCERTA) 27 MG  PO CR tablet Take 1 tablet (27 mg total) by mouth every morning. 7 tablet 0   methylphenidate 18 MG PO CR tablet Take 1 tablet (18 mg total) by mouth in the morning. 7 tablet 0   Multiple Vitamin (MULTIVITAMIN) LIQD Take 5 mLs by mouth daily.     Probiotic Product (PROBIOTIC PO) Take 1 tablet by mouth every other day.     B Complex Vitamins (B COMPLEX 1 PO) Take by mouth.     No facility-administered medications prior to visit.     EXAM:  BP 126/78 (BP Location: Left Arm, Patient Position: Sitting, Cuff Size: Normal)   Pulse 80   Temp 98.7 F (37.1 C) (Oral)   Ht 5\' 8"  (1.727 m)   Wt 207 lb 12.8 oz (94.3 kg)   SpO2 95%   BMI 31.60 kg/m   Body mass index is 31.6 kg/m.  GENERAL: vitals reviewed and listed above, alert, oriented, appears well hydrated and in no acute distress HEENT: atraumatic, conjunctiva  clear, no obvious abnormalities on inspection of external nose and ears OP : Mast CV: HRRR, no clubbing cyanosis or   peripheral edema nl cap refill  Abdomen soft no obvious ascites umbilical area laying standing flushed purplish tender but not acute no warmth 1.5 cm.  Not reducible MS: moves all extremities without noticeable focal  abnormality PSYCH: pleasant and cooperative, no obvious depression or anxiety Lab Results  Component Value Date   WBC 10.1 10/19/2020   HGB 17.2 (H) 10/19/2020   HCT 49.2 10/19/2020   PLT 250 10/19/2020   GLUCOSE 105 (H) 10/19/2020   CHOL 258 (H) 10/19/2020   TRIG 198 (H) 10/19/2020   HDL 52 10/19/2020   LDLDIRECT 190.2 12/11/2013   LDLCALC 170 (H) 10/19/2020   ALT 31 10/19/2020   AST 20 10/19/2020   NA 140 10/19/2020   K 3.7 10/19/2020   CL 101 10/19/2020   CREATININE 1.15 10/19/2020   BUN 16 10/19/2020   CO2 26 10/19/2020   TSH 0.82 10/19/2020   PSA 0.81 10/19/2020   HGBA1C 7.0 (A) 05/05/2021   MICROALBUR 0.8 10/19/2020   BP Readings from Last 3 Encounters:  06/02/21 126/78  05/05/21 126/68  03/04/21 108/72   Wt Readings from Last 3 Encounters:  06/02/21 207 lb 12.8 oz (94.3 kg)  05/05/21 212 lb 3.2 oz (96.3 kg)  03/04/21 213 lb 9.6 oz (96.9 kg)     ASSESSMENT AND PLAN:  Discussed the following assessment and plan:  Umbilical hernia without gangrene - Some discoloration and tenderness but it is been chronic advise surgical referral  -Patient advised to return or notify health care team  if  new concerns arise.  Patient Instructions  Will do  surgical referral   .  Continue healthy weight loss.  Avoid heavy lifting .    Umbilical Hernia, Adult  A hernia is a bulge of tissue that pushes through an opening between muscles. An umbilical hernia happens in the abdomen, near the belly button (umbilicus). The hernia may contain tissues from the small intestine, large intestine, or fatty tissue covering the intestines (omentum). Umbilical hernias in adults tend to get worse over time, and they requiresurgical treatment. There are several types of  umbilical hernias. You may have: A hernia located just above or below the umbilicus (indirect hernia). This is the most common type of umbilical hernia in adults. A hernia that forms through an opening formed by the umbilicus (direct hernia). A hernia that comes and goes (  reducible hernia). A reducible hernia may be visible only when you strain, lift something heavy, or cough. This type of hernia can be pushed back into the abdomen (reduced). A hernia that traps abdominal tissue inside the hernia (incarcerated hernia). This type of hernia cannot be reduced. A hernia that cuts off blood flow to the tissues inside the hernia (strangulated hernia). The tissues can start to die if this happens. This type of hernia requires emergency treatment. What are the causes? An umbilical hernia happens when tissue inside the abdomen presses on a weakarea of the abdominal muscles. What increases the risk? You may have a greater risk of this condition if you: Are obese. Have had several pregnancies. Have a buildup of fluid inside your abdomen (ascites). Have had surgery that weakens the abdominal muscles. What are the signs or symptoms? The main symptom of this condition is a painless bulge at or near the belly button. A reducible hernia may be visible only when you strain, lift something heavy, or cough. Other symptoms may include: Dull pain. A feeling of pressure. Symptoms of a strangulated hernia may include: Pain that gets increasingly worse. Nausea and vomiting. Pain when pressing on the hernia. Skin over the hernia becoming red or purple. Constipation. Blood in the stool. How is this diagnosed? This condition may be diagnosed based on: A physical exam. You may be asked to cough or strain while standing. These actions increase the pressure inside your abdomen and force the hernia through the opening in your muscles. Your health care provider may try to reduce the hernia by pressing on it. Your  symptoms and medical history. How is this treated? Surgery is the only treatment for an umbilical hernia. Surgery for a strangulated hernia is done as soon as possible. If you have a small herniathat is not incarcerated, you may need to lose weight before having surgery. Follow these instructions at home: Lose weight, if told by your health care provider. Do not try to push the hernia back in. Watch your hernia for any changes in color or size. Tell your health care provider if any changes occur. You may need to avoid activities that increase pressure on your hernia. Do not lift anything that is heavier than 10 lb (4.5 kg) until your health care provider says that this is safe. Take over-the-counter and prescription medicines only as told by your health care provider. Keep all follow-up visits as told by your health care provider. This is important. Contact a health care provider if: Your hernia gets larger. Your hernia becomes painful. Get help right away if: You develop sudden, severe pain near the area of your hernia. You have pain as well as nausea or vomiting. You have pain and the skin over your hernia changes color. You develop a fever. This information is not intended to replace advice given to you by your health care provider. Make sure you discuss any questions you have with your healthcare provider. Document Revised: 01/03/2018 Document Reviewed: 05/22/2017 Elsevier Patient Education  2021 [place]. Sherilyn Windhorst M.D.

## 2021-06-02 ENCOUNTER — Ambulatory Visit: Payer: BC Managed Care – PPO | Admitting: Internal Medicine

## 2021-06-02 ENCOUNTER — Encounter: Payer: Self-pay | Admitting: Internal Medicine

## 2021-06-02 VITALS — BP 126/78 | HR 80 | Temp 98.7°F | Ht 68.0 in | Wt 207.8 lb

## 2021-06-02 DIAGNOSIS — I10 Essential (primary) hypertension: Secondary | ICD-10-CM | POA: Diagnosis not present

## 2021-06-02 DIAGNOSIS — K429 Umbilical hernia without obstruction or gangrene: Secondary | ICD-10-CM | POA: Diagnosis not present

## 2021-06-02 DIAGNOSIS — E119 Type 2 diabetes mellitus without complications: Secondary | ICD-10-CM

## 2021-06-02 NOTE — Patient Instructions (Addendum)
Will do  surgical referral   .  Continue healthy weight loss.  Avoid heavy lifting .    Umbilical Hernia, Adult  A hernia is a bulge of tissue that pushes through an opening between muscles. An umbilical hernia happens in the abdomen, near the belly button (umbilicus). The hernia may contain tissues from the small intestine, large intestine, or fatty tissue covering the intestines (omentum). Umbilical hernias in adults tend to get worse over time, and they requiresurgical treatment. There are several types of umbilical hernias. You may have: A hernia located just above or below the umbilicus (indirect hernia). This is the most common type of umbilical hernia in adults. A hernia that forms through an opening formed by the umbilicus (direct hernia). A hernia that comes and goes (reducible hernia). A reducible hernia may be visible only when you strain, lift something heavy, or cough. This type of hernia can be pushed back into the abdomen (reduced). A hernia that traps abdominal tissue inside the hernia (incarcerated hernia). This type of hernia cannot be reduced. A hernia that cuts off blood flow to the tissues inside the hernia (strangulated hernia). The tissues can start to die if this happens. This type of hernia requires emergency treatment. What are the causes? An umbilical hernia happens when tissue inside the abdomen presses on a weakarea of the abdominal muscles. What increases the risk? You may have a greater risk of this condition if you: Are obese. Have had several pregnancies. Have a buildup of fluid inside your abdomen (ascites). Have had surgery that weakens the abdominal muscles. What are the signs or symptoms? The main symptom of this condition is a painless bulge at or near the belly button. A reducible hernia may be visible only when you strain, lift something heavy, or cough. Other symptoms may include: Dull pain. A feeling of pressure. Symptoms of a strangulated hernia  may include: Pain that gets increasingly worse. Nausea and vomiting. Pain when pressing on the hernia. Skin over the hernia becoming red or purple. Constipation. Blood in the stool. How is this diagnosed? This condition may be diagnosed based on: A physical exam. You may be asked to cough or strain while standing. These actions increase the pressure inside your abdomen and force the hernia through the opening in your muscles. Your health care provider may try to reduce the hernia by pressing on it. Your symptoms and medical history. How is this treated? Surgery is the only treatment for an umbilical hernia. Surgery for a strangulated hernia is done as soon as possible. If you have a small herniathat is not incarcerated, you may need to lose weight before having surgery. Follow these instructions at home: Lose weight, if told by your health care provider. Do not try to push the hernia back in. Watch your hernia for any changes in color or size. Tell your health care provider if any changes occur. You may need to avoid activities that increase pressure on your hernia. Do not lift anything that is heavier than 10 lb (4.5 kg) until your health care provider says that this is safe. Take over-the-counter and prescription medicines only as told by your health care provider. Keep all follow-up visits as told by your health care provider. This is important. Contact a health care provider if: Your hernia gets larger. Your hernia becomes painful. Get help right away if: You develop sudden, severe pain near the area of your hernia. You have pain as well as nausea or vomiting. You have  pain and the skin over your hernia changes color. You develop a fever. This information is not intended to replace advice given to you by your health care provider. Make sure you discuss any questions you have with your healthcare provider. Document Revised: 01/03/2018 Document Reviewed: 05/22/2017 Elsevier Patient  Education  [place].

## 2021-06-03 ENCOUNTER — Telehealth: Payer: Self-pay | Admitting: Psychiatry

## 2021-06-03 MED ORDER — METHYLPHENIDATE HCL ER (OSM) 27 MG PO TBCR: 27.0000 mg | EXTENDED_RELEASE_TABLET | ORAL | 0 refills | Status: DC

## 2021-06-03 NOTE — Telephone Encounter (Signed)
Rx sent 

## 2021-06-03 NOTE — Telephone Encounter (Signed)
Pt would like a refill on Concerta. Pt going out of town on 06/04/21. Please send to [facility] on [address].

## 2021-06-03 NOTE — Telephone Encounter (Signed)
Pharmacy approved

## 2021-06-03 NOTE — Addendum Note (Signed)
Addended by: Sharyl Nimrod on: 06/03/2021 12:29 PM   Modules accepted: Orders

## 2021-06-11 DIAGNOSIS — E119 Type 2 diabetes mellitus without complications: Secondary | ICD-10-CM | POA: Diagnosis not present

## 2021-06-11 LAB — HM DIABETES EYE EXAM

## 2021-06-22 NOTE — Telephone Encounter (Signed)
Thank you for the information Yes decrease the losartan HCTZ to 1 tablet a day After a few weeks send in some readings.  If you are still getting low blood pressure and feel bad contact us with information in interim.

## 2021-06-23 ENCOUNTER — Other Ambulatory Visit: Payer: Self-pay

## 2021-06-23 ENCOUNTER — Ambulatory Visit: Payer: BC Managed Care – PPO | Admitting: Psychology

## 2021-06-23 DIAGNOSIS — F4323 Adjustment disorder with mixed anxiety and depressed mood: Secondary | ICD-10-CM | POA: Diagnosis not present

## 2021-06-23 DIAGNOSIS — F900 Attention-deficit hyperactivity disorder, predominantly inattentive type: Secondary | ICD-10-CM

## 2021-06-23 DIAGNOSIS — F330 Major depressive disorder, recurrent, mild: Secondary | ICD-10-CM | POA: Diagnosis not present

## 2021-07-06 ENCOUNTER — Other Ambulatory Visit: Payer: Self-pay

## 2021-07-06 ENCOUNTER — Ambulatory Visit: Payer: BC Managed Care – PPO | Admitting: Psychiatry

## 2021-07-06 ENCOUNTER — Encounter: Payer: Self-pay | Admitting: Psychiatry

## 2021-07-06 VITALS — BP 137/81 | HR 68

## 2021-07-06 DIAGNOSIS — F902 Attention-deficit hyperactivity disorder, combined type: Secondary | ICD-10-CM

## 2021-07-06 MED ORDER — METHYLPHENIDATE HCL ER (OSM) 36 MG PO TBCR: 36.0000 mg | EXTENDED_RELEASE_TABLET | Freq: Every day | ORAL | 0 refills | Status: DC

## 2021-07-06 NOTE — Progress Notes (Signed)
Jonathan [name] [id number] June 24, 1961 60 y.o.  Subjective:   Patient ID:  Jonathan [name] is a 60 y.o. (DOB 09-30-1961) male.  Chief Complaint:  Chief Complaint  Patient presents with   ADHD    HPI Jonathan [name] presents to the office today for follow-up of ADHD and h/o depression. He reports that he has been doing well with Concerta and reports, "I think I have been needing something like this for a long time... night and day." He reports that he feels more calm after taking Concerta. He reports that he has not had anxiety with certain experiences that have triggered anxiety in the past, such as leading a youth trip. He reports that he was able to navigate a crisis more effectively than he has in awhile. He feels that he has been more present, calm, and supportive. Mood has been good. Denies depressed mood. Denies sleep or appetite disturbance. Energy has been good. He reports that focus seems to be improved. He reports that he has been completing tasks that he has previously been putting off. Denies SI.   He and his wife have been doing couples counseling.   Past Psychiatric Medication Trials: Citalopram- Sexual side effects, fatigue. Partially effective for depression. Increased to 15 mg in the last month and noticed more fatigue. He reports that he has been taking 10 mg most of the time since then.  Trintellix- Improved mood, anxiety, and focus on 5 mg. Excessive somnolence on 10 mg. Pristiq Wellbutrin- Took 25 years ago. Cannot recall response.  [place] Office Visit from 05/05/2021 in [place] at [facility] from 10/19/2020 in [place] at [facility] from 08/02/2019 in [place] at [facility] from 10/01/2018 in [place] at [facility] from 05/24/2017 in [place] at [facility] Total Score 2 0 4 0 0  PHQ-9 Total Score '12 9 15 '$ -- --        Review of Systems:   Review of Systems  Cardiovascular:  Negative for palpitations.  Musculoskeletal:  Negative for gait problem.  Neurological:  Negative for dizziness.  Psychiatric/Behavioral:         Please refer to HPI   Had an episode of "almost dizziness" over a week ago and had very low BP. He reports that he held BP med for 2 days and then re-started it at a half dose and reports that his BP has been more normal.   Medications: I have reviewed the patient's current medications.  Current Outpatient Medications  Medication Sig Dispense Refill   methylphenidate (CONCERTA) 36 MG PO CR tablet Take 1 tablet (36 mg total) by mouth daily. 30 tablet 0   loratadine (CLARITIN) 10 MG tablet Take 10 mg by mouth every other day.     losartan-hydrochlorothiazide (HYZAAR) 50-12.5 MG tablet TAKE 2 TABLETS BY MOUTH EVERY DAY (Patient taking differently: 1 tablet.) 180 tablet 3   metFORMIN (GLUCOPHAGE-XR) 500 MG 24 hr tablet TAKE 1 TABLET BY MOUTH TWICE A DAY 180 tablet 3   Multiple Vitamin (MULTIVITAMIN) LIQD Take 5 mLs by mouth daily.     Probiotic Product (PROBIOTIC PO) Take 1 tablet by mouth every other day.     No current facility-administered medications for this visit.    Medication Side Effects: None  Allergies:  Allergies  Allergen Reactions   Zocor [Simvastatin]     ? myalgias   Penicillins Hives    Per pt - happened in childhood -  hives possible reaction    Past Medical History:  Diagnosis Date   Allergic rhinitis    Allergy    Anxiety    Depression    Diabetes mellitus without complication (HCC)    type 2   Heart murmur    per pt told it was functional   History of kidney stones    History of nephrolithiasis    Hyperlipidemia    Hypertension    Sebaceous cyst     Past Medical History, Surgical history, Social history, and Family history were reviewed and updated as appropriate.   Please see review of systems for further details on the patient's review from today.   Objective:    Physical Exam:  BP 137/81   Pulse 68   Physical Exam Constitutional:      General: He is not in acute distress. Musculoskeletal:        General: No deformity.  Neurological:     Mental Status: He is alert and oriented to person, place, and time.     Coordination: Coordination normal.  Psychiatric:        Attention and Perception: Attention and perception normal. He does not perceive auditory or visual hallucinations.        Mood and Affect: Mood normal. Mood is not anxious or depressed. Affect is not labile, blunt, angry or inappropriate.        Speech: Speech normal.        Behavior: Behavior normal.        Thought Content: Thought content normal. Thought content is not paranoid or delusional. Thought content does not include homicidal or suicidal ideation. Thought content does not include homicidal or suicidal plan.        Cognition and Memory: Cognition and memory normal.        Judgment: Judgment normal.     Comments: Insight intact    Lab Review:     Component Value Date/Time   NA 140 10/19/2020 1206   K 3.7 10/19/2020 1206   CL 101 10/19/2020 1206   CO2 26 10/19/2020 1206   GLUCOSE 105 (H) 10/19/2020 1206   BUN 16 10/19/2020 1206   CREATININE 1.15 10/19/2020 1206   CALCIUM 9.9 10/19/2020 1206   PROT 6.5 10/19/2020 1206   ALBUMIN 3.9 01/05/2020 1014   AST 20 10/19/2020 1206   ALT 31 10/19/2020 1206   ALKPHOS 54 01/05/2020 1014   BILITOT 0.7 10/19/2020 1206   GFRNONAA 68 10/19/2020 1206   GFRAA 79 10/19/2020 1206       Component Value Date/Time   WBC 10.1 10/19/2020 1206   RBC 5.56 10/19/2020 1206   HGB 17.2 (H) 10/19/2020 1206   HCT 49.2 10/19/2020 1206   PLT 250 10/19/2020 1206   MCV 88.5 10/19/2020 1206   MCV 94.2 04/29/2013 0936   MCH 30.9 10/19/2020 1206   MCHC 35.0 10/19/2020 1206   RDW 12.5 10/19/2020 1206   LYMPHSABS 3,485 10/19/2020 1206   MONOABS 0.9 09/05/2018 0754   EOSABS 313 10/19/2020 1206   BASOSABS 81 10/19/2020 1206    No results  found for: POCLITH, LITHIUM   No results found for: PHENYTOIN, PHENOBARB, VALPROATE, CBMZ   .res Assessment: Plan:    Patient seen for 30 minutes and time spent counseling patient regarding option to either continue Concerta at 27 mg dose or increase to 36 mg daily.  He reports that he would like to increase dose to 36 mg since the signs and symptoms are significantly improved, however  he continues to have some residual attention deficit signs and symptoms.  Discussed potential benefits, risks, and side effects of increased dose of Concerta. Patient to follow-up in 4 weeks or sooner if clinically indicated. Recommend continuing psychotherapy with Bambi Cottle, LCSW. Patient advised to contact office with any questions, adverse effects, or acute worsening in signs and symptoms.   Jonathan [name] was seen today for adhd.  Diagnoses and all orders for this visit:  Attention deficit hyperactivity disorder (ADHD), combined type -     methylphenidate (CONCERTA) 36 MG PO CR tablet; Take 1 tablet (36 mg total) by mouth daily.    Please see After Visit Summary for patient specific instructions.  Future Appointments  Date Time Provider [place]  08/02/2021  3:00 PM Cottle, Lucious Groves, LCSW [facility] None  08/04/2021  1:45 PM Thayer Headings, PMHNP [facility] None    No orders of the defined types were placed in this encounter.   -------------------------------

## 2021-07-14 ENCOUNTER — Telehealth: Payer: Self-pay

## 2021-07-14 DIAGNOSIS — K429 Umbilical hernia without obstruction or gangrene: Secondary | ICD-10-CM | POA: Diagnosis not present

## 2021-07-14 DIAGNOSIS — E119 Type 2 diabetes mellitus without complications: Secondary | ICD-10-CM | POA: Diagnosis not present

## 2021-07-14 NOTE — Telephone Encounter (Signed)
   [facility] Pre-operative Risk Assessment    Patient Name: Jonathan [name]  DOB: 06-07-1961 MRN: [id number]   Request for surgical clearance:  What type of surgery is being performed  Open umbilical hernia repair with mesh  When is this surgery scheduled  TBD  What type of clearance is required Medical  Are there any medications that need to be held prior to surgery and how long None  Practice name and name of physician performing surgery  [facility]   Dr.Luke Kinsinger   What is the office phone number  [phone]   7.   What is the office fax number         [phone]  8.   Anesthesia type General   Kathyrn Lass 07/14/2021, 3:59 PM  _________________________________________________________________   (provider comments below)

## 2021-07-15 NOTE — Telephone Encounter (Signed)
Will route surgical clearance back to the requesting surgeon's office to make them aware that pt' hasn't never been seen here and if needs an appointment, will need a new pt referral due to insurance.

## 2021-07-15 NOTE — Telephone Encounter (Signed)
   Name: Jonathan [name]  DOB: [birth year]  MRN: [id number]  Primary Cardiologist: None  Chart reviewed as part of pre-operative protocol coverage. Because of [name]'s past medical history and time since last visit, he will require a follow-up visit in order to better assess preoperative cardiovascular risk.  Patient is not a patient of record with [place] so needs a new patient appointment.  Pre-op covering staff: - Please schedule appointment and call patient to inform them. If patient already had an upcoming appointment within acceptable timeframe, please add "pre-op clearance" to the appointment notes so provider is aware. - Please contact requesting surgeon's office via preferred method (i.e, phone, fax) to inform them of need for appointment prior to surgery.   Charlie Pitter, PA-C  07/15/2021, 11:39 AM

## 2021-07-15 NOTE — Telephone Encounter (Signed)
Spoke with pt.  He hasn't seen cardiologist in the past and he isn't sure if his Insurance requires a new pt referral or not.  Pt will call to find out and if they don't he will call back to schedule 1st available new pt appointment, if Insurance does require, he will make sure that's done.   Pt was very appreciative of the call.

## 2021-07-15 NOTE — Telephone Encounter (Signed)
Pt scheduled to see see Dr. Marlou Porch as a New pt 09/16/21. Will send FYI to surgeon's office pt has New pt appt.

## 2021-07-16 ENCOUNTER — Ambulatory Visit
Admission: EM | Admit: 2021-07-16 | Discharge: 2021-07-16 | Disposition: A | Discharge status: Home / Self Care | Payer: BC Managed Care – PPO | Attending: Emergency Medicine | Admitting: Emergency Medicine

## 2021-07-16 ENCOUNTER — Other Ambulatory Visit: Payer: Self-pay

## 2021-07-16 DIAGNOSIS — R109 Unspecified abdominal pain: Secondary | ICD-10-CM | POA: Insufficient documentation

## 2021-07-16 DIAGNOSIS — R3129 Other microscopic hematuria: Secondary | ICD-10-CM | POA: Diagnosis not present

## 2021-07-16 LAB — POCT URINALYSIS DIP (MANUAL ENTRY)
Bilirubin, UA: NEGATIVE
Glucose, UA: NEGATIVE mg/dL
Ketones, POC UA: NEGATIVE mg/dL
Leukocytes, UA: NEGATIVE
Nitrite, UA: NEGATIVE
Protein Ur, POC: NEGATIVE mg/dL
Spec Grav, UA: 1.01 (ref 1.010–1.025)
Urobilinogen, UA: 0.2 E.U./dL
pH, UA: 6 (ref 5.0–8.0)

## 2021-07-16 MED ORDER — NAPROXEN 500 MG PO TABS: 500.0000 mg | ORAL_TABLET | Freq: Two times a day (BID) | ORAL | 0 refills | Status: DC

## 2021-07-16 MED ORDER — HYDROCODONE-ACETAMINOPHEN 5-325 MG PO TABS: 1.0000 | ORAL_TABLET | Freq: Four times a day (QID) | ORAL | 0 refills | Status: DC | PRN

## 2021-07-16 MED ORDER — TAMSULOSIN HCL 0.4 MG PO CAPS: 0.4000 mg | ORAL_CAPSULE | Freq: Every day | ORAL | 0 refills | Status: DC

## 2021-07-16 NOTE — Discharge Instructions (Addendum)
No signs of infection, urine culture pending to further rule out UTI Small amount of blood present indicating possible stone Begin Flomax daily Naprosyn twice daily with food Hydrocodone for severe pain-use sparingly, only at home or bedtime Please follow-up with urology If developing worsening pain, urinary retention, nausea, fevers please go to emergency room

## 2021-07-16 NOTE — ED Triage Notes (Signed)
Patient presents to Urgent Care with complaints of possible UTI. He states he has right lower flank pain x 2 weeks, groin pain, increased urgency, dark color cloudy urine, and hematuria x 2-3 days ago. Pt has a Hx of kidney stones, per urologist earlier this year no stones present.   Denies fever.

## 2021-07-16 NOTE — ED Provider Notes (Signed)
[facility]    CSN: [id number] Arrival date & time: 07/16/21  0947      History   Chief Complaint Chief Complaint  Patient presents with   Abdominal Pain   Flank Pain     HPI Jonathan [name] is a 60 y.o. male history of DM type II, hypertension presenting today for evaluation of possible UTI. Reports yesterday morning approximately 0930 noticed a darker tinge with cloudiness to his urine and developed a more increasing dull ache from right flank around to RLQ in abdomen. Patient reports he has a history of kidney stones and subsequently drinks 80oz of water with crystal light daily to "keep his system flushed". Endorses decreased appetite, increased fatigue, denies nausea, emesis, diarrhea, chest pain or shortness of breath. States last bowel movement this morning but has been slightly constipated due to a known umbilical hernia that he is in the process of scheduling a repair for. Endorses taking tylenol for pain control.   HPI  Past Medical History:  Diagnosis Date   Allergic rhinitis    Allergy    Anxiety    Depression    Diabetes mellitus without complication ([place])    type 2   Heart murmur    per pt told it was functional   History of kidney stones    History of nephrolithiasis    Hyperlipidemia    Hypertension    Sebaceous cyst     Patient Active Problem List   Diagnosis Date Noted   Prediabetes 05/15/2016   Essential hypertension 05/15/2016   Hyperlipidemia 05/15/2016   Dyspepsia and other specified disorders of function of stomach 10/25/2013   Change in bowel habits 09/07/2013   Medication management 09/07/2013   Heartburn symptom 09/07/2013   Hyperglycemia 09/04/2013   BMI 31.0-31.9,adult 05/07/2013   SKIN LESION, UNCERTAIN SIGNIFICANCE 03/31/2010   NEPHROLITHIASIS, HX OF 02/10/2008   HYPERLIPIDEMIA 02/06/2008   ANXIETY 02/06/2008   DEPRESSION 02/06/2008   ATTENTION DEFICIT DISORDER, ADULT 02/06/2008   HYPERTENSION 02/06/2008   ALLERGIC  RHINITIS 02/06/2008    Past Surgical History:  Procedure Laterality Date   COLONOSCOPY  07/2010   Deatra Ina   CYSTOSCOPY/URETEROSCOPY/HOLMIUM LASER/STENT PLACEMENT Left 01/15/2020   Procedure: CYSTOSCOPY/URETEROSCOPY/HOLMIUM LASER/STENT PLACEMENT;  Surgeon: Ceasar Mons, MD;  Location: [facility];  Service: Urology;  Laterality: Left;   kidney stones removed  cystoscopy   x 2   TONSILLECTOMY     x 2   TYMPANOSTOMY TUBE PLACEMENT  as child       Home Medications    Prior to Admission medications   Medication Sig Start Date End Date Taking? Authorizing Provider  HYDROcodone-acetaminophen (NORCO/VICODIN) 5-325 MG tablet Take 1-2 tablets by mouth every 6 (six) hours as needed. 07/16/21  Yes Milan Clare C, PA-C  naproxen (NAPROSYN) 500 MG tablet Take 1 tablet (500 mg total) by mouth 2 (two) times daily. 07/16/21  Yes Aleyah Balik C, PA-C  tamsulosin (FLOMAX) 0.4 MG CAPS capsule Take 1 capsule (0.4 mg total) by mouth daily. 07/16/21  Yes Morrisa Aldaba C, PA-C  loratadine (CLARITIN) 10 MG tablet Take 10 mg by mouth every other day.    [provider]  losartan-hydrochlorothiazide (HYZAAR) 50-12.5 MG tablet TAKE 2 TABLETS BY MOUTH EVERY DAY Patient taking differently: 1 tablet. 09/21/20   Panosh, Standley Brooking, MD  metFORMIN (GLUCOPHAGE-XR) 500 MG 24 hr tablet TAKE 1 TABLET BY MOUTH TWICE A DAY 11/02/20   Panosh, Standley Brooking, MD  methylphenidate (CONCERTA) 36 MG PO CR  tablet Take 1 tablet (36 mg total) by mouth daily. 07/06/21   Thayer Headings, PMHNP  Multiple Vitamin (MULTIVITAMIN) LIQD Take 5 mLs by mouth daily.    [provider]  Probiotic Product (PROBIOTIC PO) Take 1 tablet by mouth every other day.    [provider]    Family History Family History  Problem Relation Age of Onset   Diabetes Father    Hypertension Father    Kidney cancer Father        now on dialysis   Kidney Stones Father    Kidney Stones Brother     Huntington's disease Maternal Grandfather    Depression Daughter    Anxiety disorder Daughter    ADD / ADHD Daughter    Colon polyps Daughter 89   Colon cancer Neg Hx    Esophageal cancer Neg Hx    Stomach cancer Neg Hx    Rectal cancer Neg Hx     Social History Social History   Tobacco Use   Smoking status: Never   Smokeless tobacco: Never  Vaping Use   Vaping Use: Never used  Substance Use Topics   Alcohol use: Yes    Comment: less than 1 a week   Drug use: No     Allergies   Zocor [simvastatin] and Penicillins   Review of Systems Review of Systems  Constitutional:  Negative for fever.  HENT:  Negative for sore throat.   Respiratory:  Negative for shortness of breath.   Cardiovascular:  Negative for chest pain.  Gastrointestinal:  Positive for constipation. Negative for abdominal pain, nausea and vomiting.  Genitourinary:  Positive for dysuria, flank pain and urgency. Negative for difficulty urinating, frequency, penile discharge, penile pain, penile swelling, scrotal swelling and testicular pain.  Skin:  Negative for rash.  Neurological:  Negative for dizziness, light-headedness and headaches.    Physical Exam Triage Vital Signs ED Triage Vitals  Enc Vitals Group     BP      Pulse      Resp      Temp      Temp src      SpO2      Weight      Height      Head Circumference      Peak Flow      Pain Score      Pain Loc      Pain Edu?      Excl. in [place]?    No data found.  Updated Vital Signs BP (!) 150/91 (BP Location: Left Arm)   Pulse 76   Temp (!) 97.4 F (36.3 C) (Oral)   Resp 16   SpO2 97%   Visual Acuity Right Eye Distance:   Left Eye Distance:   Bilateral Distance:    Right Eye Near:   Left Eye Near:    Bilateral Near:     Physical Exam Vitals and nursing note reviewed.  Constitutional:      Appearance: He is well-developed.     Comments: No acute distress  HENT:     Head: Normocephalic and atraumatic.     Nose: Nose normal.   Eyes:     Conjunctiva/sclera: Conjunctivae normal.  Cardiovascular:     Rate and Rhythm: Normal rate and regular rhythm.  Pulmonary:     Effort: Pulmonary effort is normal. No respiratory distress.  Abdominal:     General: There is no distension.     Comments: Umbilical hernia present, minimal tenderness  palpation right lower quadrant, negative rebound, negative Rovsing, negative McBurney's  Musculoskeletal:        General: Normal range of motion.     Cervical back: Neck supple.     Comments: Right lower back with tenderness to palpation in lumbar area extending into right flank  Skin:    General: Skin is warm and dry.  Neurological:     Mental Status: He is alert and oriented to person, place, and time.     UC Treatments / Results  Labs (all labs ordered are listed, but only abnormal results are displayed) Labs Reviewed  POCT URINALYSIS DIP (MANUAL ENTRY) - Abnormal; Notable for the following components:      Result Value   Color, UA light yellow (*)    Clarity, UA cloudy (*)    Blood, UA trace-lysed (*)    All other components within normal limits  URINE CULTURE    EKG   Radiology No results found.  Procedures Procedures (including critical care time)  Medications Ordered in UC Medications - No data to display  Initial Impression / Assessment and Plan / UC Course  I have reviewed the triage vital signs and the nursing notes.  Pertinent labs & imaging results that were available during my care of the patient were reviewed by me and considered in my medical decision making (see chart for details).     UA with negative leuks and nitrites, does have trace hemoglobin, most suspicious of underlying stone contributing to symptoms, initiating on Flomax, discussed pain control with Naprosyn, hydrocodone for severe pain, contact urology for follow-up, symptoms worsening or worsening over the week and follow-up in emergency room for further imaging.  Discussed strict  return precautions. Patient verbalized understanding and is agreeable with plan.  Final Clinical Impressions(s) / UC Diagnoses   Final diagnoses:  Right flank pain  Other microscopic hematuria     Discharge Instructions      No signs of infection, urine culture pending to further rule out UTI Small amount of blood present indicating possible stone Begin Flomax daily Naprosyn twice daily with food Hydrocodone for severe pain-use sparingly, only at home or bedtime Please follow-up with urology If developing worsening pain, urinary retention, nausea, fevers please go to emergency room     ED Prescriptions     Medication Sig Dispense Auth. Provider   tamsulosin (FLOMAX) 0.4 MG CAPS capsule Take 1 capsule (0.4 mg total) by mouth daily. 15 capsule Cambelle Suchecki C, PA-C   naproxen (NAPROSYN) 500 MG tablet Take 1 tablet (500 mg total) by mouth 2 (two) times daily. 30 tablet Nanetta Wiegman C, PA-C   HYDROcodone-acetaminophen (NORCO/VICODIN) 5-325 MG tablet Take 1-2 tablets by mouth every 6 (six) hours as needed. 8 tablet Darragh Nay, [place] C, PA-C      I have reviewed the PDMP during this encounter.   Janith Lima, [place] 07/16/21 1236

## 2021-07-16 NOTE — Telephone Encounter (Signed)
FYI cardiac in process

## 2021-07-17 LAB — URINE CULTURE: Culture: NO GROWTH

## 2021-07-19 ENCOUNTER — Telehealth: Payer: Self-pay

## 2021-07-19 DIAGNOSIS — R1084 Generalized abdominal pain: Secondary | ICD-10-CM | POA: Diagnosis not present

## 2021-07-19 DIAGNOSIS — R310 Gross hematuria: Secondary | ICD-10-CM | POA: Diagnosis not present

## 2021-07-19 NOTE — Telephone Encounter (Signed)
Referral notes received from [facility], Phone #: [phone], Fax #: [phone]   A copy of the notes have been placed in the scheduling box for check-out to pick-up and to enter referral. Original notes placed in file cabinet.

## 2021-08-02 ENCOUNTER — Ambulatory Visit (INDEPENDENT_AMBULATORY_CARE_PROVIDER_SITE_OTHER): Payer: BC Managed Care – PPO | Admitting: Psychology

## 2021-08-02 ENCOUNTER — Other Ambulatory Visit: Payer: Self-pay

## 2021-08-02 DIAGNOSIS — F900 Attention-deficit hyperactivity disorder, predominantly inattentive type: Secondary | ICD-10-CM | POA: Diagnosis not present

## 2021-08-02 DIAGNOSIS — F330 Major depressive disorder, recurrent, mild: Secondary | ICD-10-CM

## 2021-08-02 DIAGNOSIS — K5730 Diverticulosis of large intestine without perforation or abscess without bleeding: Secondary | ICD-10-CM | POA: Diagnosis not present

## 2021-08-02 DIAGNOSIS — F4323 Adjustment disorder with mixed anxiety and depressed mood: Secondary | ICD-10-CM

## 2021-08-02 DIAGNOSIS — K760 Fatty (change of) liver, not elsewhere classified: Secondary | ICD-10-CM | POA: Diagnosis not present

## 2021-08-02 DIAGNOSIS — N200 Calculus of kidney: Secondary | ICD-10-CM | POA: Diagnosis not present

## 2021-08-02 DIAGNOSIS — R310 Gross hematuria: Secondary | ICD-10-CM | POA: Diagnosis not present

## 2021-08-04 ENCOUNTER — Telehealth: Payer: Self-pay | Admitting: Psychiatry

## 2021-08-04 ENCOUNTER — Other Ambulatory Visit: Payer: Self-pay

## 2021-08-04 ENCOUNTER — Ambulatory Visit: Payer: BC Managed Care – PPO | Admitting: Psychiatry

## 2021-08-04 DIAGNOSIS — F902 Attention-deficit hyperactivity disorder, combined type: Secondary | ICD-10-CM

## 2021-08-04 NOTE — Telephone Encounter (Signed)
Pended.

## 2021-08-04 NOTE — Telephone Encounter (Signed)
Patient called requesting a refill on the Concerta 36 mg PO CR tab. He stated he needs as soon as possible. Fill at the [facility] on [address]. Patient is scheduled for an office visit on tomorrow 9/1.

## 2021-08-05 ENCOUNTER — Encounter: Payer: Self-pay | Admitting: Psychiatry

## 2021-08-05 ENCOUNTER — Ambulatory Visit (INDEPENDENT_AMBULATORY_CARE_PROVIDER_SITE_OTHER): Payer: BC Managed Care – PPO | Admitting: Psychiatry

## 2021-08-05 ENCOUNTER — Other Ambulatory Visit: Payer: Self-pay

## 2021-08-05 DIAGNOSIS — F902 Attention-deficit hyperactivity disorder, combined type: Secondary | ICD-10-CM

## 2021-08-05 MED ORDER — METHYLPHENIDATE HCL 10 MG PO TABS: ORAL_TABLET | ORAL | 0 refills | Status: DC

## 2021-08-05 MED ORDER — METHYLPHENIDATE HCL ER (OSM) 36 MG PO TBCR: 36.0000 mg | EXTENDED_RELEASE_TABLET | Freq: Every day | ORAL | 0 refills | Status: DC

## 2021-08-05 NOTE — Progress Notes (Signed)
SHIVA EISENSTADT [id number] 11/12/1961 60 y.o.  Subjective:   Patient ID:  Jonathan [name] is a 60 y.o. (DOB Mar 12, 1961) male.  Chief Complaint:  Chief Complaint  Patient presents with   ADHD     HPI Jonathan [name] presents to the office today for follow-up of ADHD. He reports that concerta 36 mg po qd seems to be very effective and well tolerated for him. He reports that one morning he forgot to take Concerta and noticed a difference and was having difficulty staying focused and being productive despite working. He reports that he was distracted and felt "scattered" when he did not take Concerta. He notices when medication starts to wear off. He reports that Concerta is "a Higher education careers adviser for me." He feels he is better able to prioritize and set appropriate boundaries. He reports that he has been less irritable and feels "calmer." He notices he is no longer racing and "more able to effectively communicate." He notices some distractibility later in the day. Medication seems to wear off around 3-4 am. Energy and motivation have been good. Sleep has been ok overall with sleep disturbance a couple nights ago. Appetite has been good. Denies SI.   Has been dealing with kidney stones and umbilical hernia.    Past Psychiatric Medication Trials: Citalopram- Sexual side effects, fatigue. Partially effective for depression. Increased to 15 mg in the last month and noticed more fatigue. He reports that he has been taking 10 mg most of the time since then.  Trintellix- Improved mood, anxiety, and focus on 5 mg. Excessive somnolence on 10 mg. Pristiq Wellbutrin- Took 25 years ago. Cannot recall response. Concerta  0000000    [place] Office Visit from 05/05/2021 in [place] at [facility] from 10/19/2020 in [place] at [place] from 08/02/2019 in [place] at [employer] [facility] from 10/01/2018 in [place] at [facility] from 05/24/2017 in [place] at [facility] Total Score 2 0 4 0 0  PHQ-9 Total Score '12 9 15 '$ -- --      Flowsheet Row ED from 07/16/2021 in [facility] at [place] Error: Question 6 not populated        Review of Systems:  Review of Systems  Cardiovascular:  Negative for palpitations.  Genitourinary:  Positive for flank pain.  Musculoskeletal:  Negative for gait problem.  Psychiatric/Behavioral:         Please refer to HPI   Medications: I have reviewed the patient's current medications.  Current Outpatient Medications  Medication Sig Dispense Refill   loratadine (CLARITIN) 10 MG tablet Take 10 mg by mouth every other day.     losartan-hydrochlorothiazide (HYZAAR) 50-12.5 MG tablet TAKE 2 TABLETS BY MOUTH EVERY DAY (Patient taking differently: 1 tablet.) 180 tablet 3   metFORMIN (GLUCOPHAGE-XR) 500 MG 24 hr tablet TAKE 1 TABLET BY MOUTH TWICE A DAY 180 tablet 3   [START ON 09/02/2021] methylphenidate (CONCERTA) 36 MG PO CR tablet Take 1 tablet (36 mg total) by mouth daily. 30 tablet 0   methylphenidate (RITALIN) 10 MG tablet Take 1/2-1 tablet daily as needed 30 tablet 0   [START ON 09/02/2021] methylphenidate (RITALIN) 10 MG tablet Take 1/2-1 tablet daily as needed 30 tablet 0   Multiple Vitamin (MULTIVITAMIN) LIQD Take 5 mLs by mouth daily.     naproxen (NAPROSYN) 500 MG tablet Take 1 tablet (500 mg total) by mouth 2 (two) times daily.  30 tablet 0   Probiotic Product (PROBIOTIC PO) Take 1 tablet by mouth every other day.     tamsulosin (FLOMAX) 0.4 MG CAPS capsule Take 1 capsule (0.4 mg total) by mouth daily. 15 capsule 0   HYDROcodone-acetaminophen (NORCO/VICODIN) 5-325 MG tablet Take 1-2 tablets by mouth every 6 (six) hours as needed. (Patient not taking: Reported on 08/05/2021) 8 tablet 0   methylphenidate (CONCERTA) 36 MG PO CR tablet Take 1 tablet (36 mg total) by mouth daily. 30 tablet 0   No current  facility-administered medications for this visit.    Medication Side Effects: None  Allergies:  Allergies  Allergen Reactions   Zocor [Simvastatin]     ? myalgias   Penicillins Hives    Per pt - happened in childhood - hives possible reaction    Past Medical History:  Diagnosis Date   Allergic rhinitis    Allergy    Anxiety    Depression    Diabetes mellitus without complication (HCC)    type 2   Heart murmur    per pt told it was functional   History of kidney stones    History of nephrolithiasis    Hyperlipidemia    Hypertension    Sebaceous cyst     Past Medical History, Surgical history, Social history, and Family history were reviewed and updated as appropriate.   Please see review of systems for further details on the patient's review from today.   Objective:   Physical Exam:  BP 126/83   Pulse 81   Physical Exam Constitutional:      General: He is not in acute distress. Musculoskeletal:        General: No deformity.  Neurological:     Mental Status: He is alert and oriented to person, place, and time.     Coordination: Coordination normal.  Psychiatric:        Attention and Perception: Attention and perception normal. He does not perceive auditory or visual hallucinations.        Mood and Affect: Mood normal. Mood is not anxious or depressed. Affect is not labile, blunt, angry or inappropriate.        Speech: Speech normal.        Behavior: Behavior normal.        Thought Content: Thought content normal. Thought content is not paranoid or delusional. Thought content does not include homicidal or suicidal ideation. Thought content does not include homicidal or suicidal plan.        Cognition and Memory: Cognition and memory normal.        Judgment: Judgment normal.     Comments: Insight intact    Lab Review:     Component Value Date/Time   NA 140 10/19/2020 1206   K 3.7 10/19/2020 1206   CL 101 10/19/2020 1206   CO2 26 10/19/2020 1206   GLUCOSE  105 (H) 10/19/2020 1206   BUN 16 10/19/2020 1206   CREATININE 1.15 10/19/2020 1206   CALCIUM 9.9 10/19/2020 1206   PROT 6.5 10/19/2020 1206   ALBUMIN 3.9 01/05/2020 1014   AST 20 10/19/2020 1206   ALT 31 10/19/2020 1206   ALKPHOS 54 01/05/2020 1014   BILITOT 0.7 10/19/2020 1206   GFRNONAA 68 10/19/2020 1206   GFRAA 79 10/19/2020 1206       Component Value Date/Time   WBC 10.1 10/19/2020 1206   RBC 5.56 10/19/2020 1206   HGB 17.2 (H) 10/19/2020 1206   HCT 49.2 10/19/2020 1206  PLT 250 10/19/2020 1206   MCV 88.5 10/19/2020 1206   MCV 94.2 04/29/2013 0936   MCH 30.9 10/19/2020 1206   MCHC 35.0 10/19/2020 1206   RDW 12.5 10/19/2020 1206   LYMPHSABS 3,485 10/19/2020 1206   MONOABS 0.9 09/05/2018 0754   EOSABS 313 10/19/2020 1206   BASOSABS 81 10/19/2020 1206    No results found for: POCLITH, LITHIUM   No results found for: PHENYTOIN, PHENOBARB, VALPROATE, CBMZ   .res Assessment: Plan:    Discussed potential benefits, risks, and side effects of methylphenidate immediate release.  Recommended adding Ritalin 10 mg 1/2 to 1 tablet daily as needed for ADHD since patient reports that Concerta is typically effective until mid afternoon and his workday often extends into the evening with occasional meetings in the evening.  Discussed that Ritalin could be used as needed when work responsibilities extend to the evening or when he has other tasks or activities later in the day that require his ability to focus with minimal distractibility.  Patient agrees to trial of Ritalin. Will start Ritalin 10 mg 1/2 to 1 tablet daily as needed for ADHD signs and symptoms. Continue Concerta 36 mg in the morning for ADHD. Recommend continuing psychotherapy with Bambi Cottle, LCSW. Patient to follow-up with this provider in 2 months or sooner if clinically indicated. Patient advised to contact office with any questions, adverse effects, or acute worsening in signs and symptoms.   Jonathan [name] was seen  today for adhd.  Diagnoses and all orders for this visit:  Attention deficit hyperactivity disorder (ADHD), combined type -     methylphenidate (RITALIN) 10 MG tablet; Take 1/2-1 tablet daily as needed -     methylphenidate (CONCERTA) 36 MG PO CR tablet; Take 1 tablet (36 mg total) by mouth daily. -     methylphenidate (CONCERTA) 36 MG PO CR tablet; Take 1 tablet (36 mg total) by mouth daily. -     methylphenidate (RITALIN) 10 MG tablet; Take 1/2-1 tablet daily as needed    Please see After Visit Summary for patient specific instructions.  Future Appointments  Date Time Provider [place]  10/06/2021  1:45 PM Thayer Headings, PMHNP CP-CP None    No orders of the defined types were placed in this encounter.   -------------------------------

## 2021-08-12 DIAGNOSIS — E7849 Other hyperlipidemia: Secondary | ICD-10-CM | POA: Diagnosis not present

## 2021-08-12 DIAGNOSIS — R011 Cardiac murmur, unspecified: Secondary | ICD-10-CM | POA: Diagnosis not present

## 2021-08-12 DIAGNOSIS — Z01818 Encounter for other preprocedural examination: Secondary | ICD-10-CM | POA: Diagnosis not present

## 2021-08-17 DIAGNOSIS — N202 Calculus of kidney with calculus of ureter: Secondary | ICD-10-CM | POA: Diagnosis not present

## 2021-08-17 LAB — BASIC METABOLIC PANEL
CO2: 30 — AB (ref 13–22)
Chloride: 102 (ref 99–108)
Creatinine: 1.1 (ref 0.6–1.3)
Potassium: 4.3 (ref 3.4–5.3)
Sodium: 141 (ref 137–147)

## 2021-08-17 LAB — COMPREHENSIVE METABOLIC PANEL
Calcium: 9.5 (ref 8.7–10.7)
GFR calc Af Amer: 83.5
GFR calc non Af Amer: 72.1

## 2021-08-18 ENCOUNTER — Other Ambulatory Visit: Payer: Self-pay

## 2021-08-18 MED ORDER — LOSARTAN POTASSIUM-HCTZ 50-12.5 MG PO TABS: 2.0000 | ORAL_TABLET | Freq: Every day | ORAL | 1 refills | Status: DC

## 2021-08-18 MED ORDER — METFORMIN HCL ER 500 MG PO TB24: 500.0000 mg | ORAL_TABLET | Freq: Two times a day (BID) | ORAL | 1 refills | Status: DC

## 2021-08-20 NOTE — Telephone Encounter (Signed)
Ok   Jonathan [name] please update his med dosing list.

## 2021-08-20 NOTE — Telephone Encounter (Signed)
Med list updated

## 2021-09-02 ENCOUNTER — Other Ambulatory Visit: Payer: Self-pay

## 2021-09-02 ENCOUNTER — Encounter: Payer: Self-pay | Admitting: Internal Medicine

## 2021-09-02 ENCOUNTER — Ambulatory Visit (INDEPENDENT_AMBULATORY_CARE_PROVIDER_SITE_OTHER): Payer: BC Managed Care – PPO | Admitting: Psychology

## 2021-09-02 DIAGNOSIS — F330 Major depressive disorder, recurrent, mild: Secondary | ICD-10-CM

## 2021-09-02 DIAGNOSIS — F4323 Adjustment disorder with mixed anxiety and depressed mood: Secondary | ICD-10-CM

## 2021-09-02 DIAGNOSIS — F900 Attention-deficit hyperactivity disorder, predominantly inattentive type: Secondary | ICD-10-CM

## 2021-09-09 DIAGNOSIS — I10 Essential (primary) hypertension: Secondary | ICD-10-CM | POA: Diagnosis not present

## 2021-09-09 DIAGNOSIS — I517 Cardiomegaly: Secondary | ICD-10-CM | POA: Diagnosis not present

## 2021-09-16 ENCOUNTER — Encounter: Payer: Self-pay | Admitting: Psychiatry

## 2021-09-16 ENCOUNTER — Ambulatory Visit: Payer: BC Managed Care – PPO | Admitting: Cardiology

## 2021-09-16 ENCOUNTER — Other Ambulatory Visit: Payer: Self-pay

## 2021-09-16 ENCOUNTER — Ambulatory Visit (INDEPENDENT_AMBULATORY_CARE_PROVIDER_SITE_OTHER): Payer: BC Managed Care – PPO | Admitting: Psychiatry

## 2021-09-16 VITALS — BP 132/87 | HR 103

## 2021-09-16 DIAGNOSIS — F902 Attention-deficit hyperactivity disorder, combined type: Secondary | ICD-10-CM

## 2021-09-16 MED ORDER — MODAFINIL 200 MG PO TABS: ORAL_TABLET | ORAL | 0 refills | Status: DC

## 2021-09-16 NOTE — Progress Notes (Signed)
Jonathan [name] [id number] 1961-01-28 60 y.o.  Subjective:   Patient ID:  Jonathan [name] is a 60 y.o. (DOB May 25, 1961) male.  Chief Complaint:  Chief Complaint  Patient presents with   Follow-up    ADD, anxiety, and depression    HPI Jonathan [name] presents to the office today for follow-up of ADD. He reports that he is waiting to schedule hernia repair. He had to first obtain clearance from cardiology on 08/12/21 and had elevated BP. He then stopped Concerta except for on Sunday mornings. He reports, "I've always had white-coat" hypertension. He had stress test 09/09/21 and this was normal. Reports early Monday morning he was awakened with panic and anxiety- "an overwhelming since of terror and dread that I would not be able to breathe" due to congestion secondary to sinus infection despite being able to clear congestion. He reports that he took an OTC decongestant Sunday night. He reports that he has had a few episodes of panic previously in his lifetime. He reports that he re-started Concerta and noticed he feels less anxious and feels that his BP "may be a bit up," ie. 138/85.  He reports that concentration has been ok. He reports that energy has been lower off Concerta. Mood has been slightly lower. Slept ok last night. Appetite has been slightly decreased and attributes this to Concerta and sinus infection. Denies any other anxiety. Denies SI.   He reports that he has used Methylphenidate IR x 1.  Past Psychiatric Medication Trials: Citalopram- Sexual side effects, fatigue. Partially effective for depression. Increased to 15 mg in the last month and noticed more fatigue. He reports that he has been taking 10 mg most of the time since then.  Trintellix- Improved mood, anxiety, and focus on 5 mg. Excessive somnolence on 10 mg. Pristiq Wellbutrin- Took 25 years ago. Cannot recall response. Concerta  [id number]    [place] Office Visit from 05/05/2021 in [place] at  [facility] from 10/19/2020 in [place] at [facility] from 08/02/2019 in [place] at [facility] from 10/01/2018 in [place] at [facility] from 05/24/2017 in [place] at [facility] Total Score 2 0 4 0 0  PHQ-9 Total Score 12 9 15  -- --      Flowsheet Row ED from 07/16/2021 in [facility] at [place] Error: Question 6 not populated        Review of Systems:  Review of Systems  HENT:  Positive for congestion.   Musculoskeletal:  Negative for gait problem.  Psychiatric/Behavioral:         Please refer to HPI   Medications: I have reviewed the patient's current medications.  Current Outpatient Medications  Medication Sig Dispense Refill   loratadine (CLARITIN) 10 MG tablet Take 10 mg by mouth every other day.     modafinil (PROVIGIL) 200 MG tablet Take 1/2-1 tablet daily 30 tablet 0   PHENYLEPHRINE HCL PO Take by mouth.     HYDROcodone-acetaminophen (NORCO/VICODIN) 5-325 MG tablet Take 1-2 tablets by mouth every 6 (six) hours as needed. (Patient not taking: No sig reported) 8 tablet 0   losartan-hydrochlorothiazide (HYZAAR) 50-12.5 MG tablet Take 2 tablets by mouth daily. 180 tablet 1   metFORMIN (GLUCOPHAGE-XR) 500 MG 24 hr tablet Take 1 tablet (500 mg total) by mouth 2 (two) times daily. 180 tablet 1   Multiple Vitamin (MULTIVITAMIN) LIQD Take 5 mLs by mouth daily.  naproxen (NAPROSYN) 500 MG tablet Take 1 tablet (500 mg total) by mouth 2 (two) times daily. 30 tablet 0   Probiotic Product (PROBIOTIC PO) Take 1 tablet by mouth every other day.     tamsulosin (FLOMAX) 0.4 MG CAPS capsule Take 1 capsule (0.4 mg total) by mouth daily. 15 capsule 0   No current facility-administered medications for this visit.    Medication Side Effects: Other: Possible increase in BP  Allergies:  Allergies  Allergen Reactions   Zocor [Simvastatin]     ?  myalgias   Penicillins Hives    Per pt - happened in childhood - hives possible reaction    Past Medical History:  Diagnosis Date   Allergic rhinitis    Allergy    Anxiety    Depression    Diabetes mellitus without complication (HCC)    type 2   Heart murmur    per pt told it was functional   History of kidney stones    History of nephrolithiasis    Hyperlipidemia    Hypertension    Sebaceous cyst     Past Medical History, Surgical history, Social history, and Family history were reviewed and updated as appropriate.   Please see review of systems for further details on the patient's review from today.   Objective:   Physical Exam:  BP 132/87   Pulse (!) 103   Physical Exam Constitutional:      General: He is not in acute distress. Musculoskeletal:        General: No deformity.  Neurological:     Mental Status: He is alert and oriented to person, place, and time.     Coordination: Coordination normal.  Psychiatric:        Attention and Perception: Attention and perception normal. He does not perceive auditory or visual hallucinations.        Mood and Affect: Mood normal. Mood is not anxious or depressed. Affect is not labile, blunt, angry or inappropriate.        Speech: Speech normal.        Behavior: Behavior normal.        Thought Content: Thought content normal. Thought content is not paranoid or delusional. Thought content does not include homicidal or suicidal ideation. Thought content does not include homicidal or suicidal plan.        Cognition and Memory: Cognition and memory normal.        Judgment: Judgment normal.     Comments: Insight intact    Lab Review:     Component Value Date/Time   NA 141 08/17/2021 0000   K 4.3 08/17/2021 0000   CL 102 08/17/2021 0000   CO2 30 (A) 08/17/2021 0000   GLUCOSE 105 (H) 10/19/2020 1206   BUN 16 10/19/2020 1206   CREATININE 1.1 08/17/2021 0000   CREATININE 1.15 10/19/2020 1206   CALCIUM 9.5 08/17/2021 0000    PROT 6.5 10/19/2020 1206   ALBUMIN 3.9 01/05/2020 1014   AST 20 10/19/2020 1206   ALT 31 10/19/2020 1206   ALKPHOS 54 01/05/2020 1014   BILITOT 0.7 10/19/2020 1206   GFRNONAA 72.1 08/17/2021 0000   GFRNONAA 68 10/19/2020 1206   GFRAA 83.5 08/17/2021 0000   GFRAA 79 10/19/2020 1206       Component Value Date/Time   WBC 10.1 10/19/2020 1206   RBC 5.56 10/19/2020 1206   HGB 17.2 (H) 10/19/2020 1206   HCT 49.2 10/19/2020 1206   PLT 250 10/19/2020 1206  MCV 88.5 10/19/2020 1206   MCV 94.2 04/29/2013 0936   MCH 30.9 10/19/2020 1206   MCHC 35.0 10/19/2020 1206   RDW 12.5 10/19/2020 1206   LYMPHSABS 3,485 10/19/2020 1206   MONOABS 0.9 09/05/2018 0754   EOSABS 313 10/19/2020 1206   BASOSABS 81 10/19/2020 1206    No results found for: POCLITH, LITHIUM   No results found for: PHENYTOIN, PHENOBARB, VALPROATE, CBMZ   .res Assessment: Plan:   Case staffed with Dr. Clovis Pu. Pt seen for 30 minutes and time spent discussing recent increased BP and cardiologist's concern about stimulant medication. Also discussed that slight elevations in BP may be related to taking decongestant and resuming Concerta. Recommended not re-starting Concerta at this time due to concerns about elevation in BP and upcoming surgery. Discussed trial of Modafinil for off label indication for ADHD since it has stimulant like effects with less risk of cardiac side effects and lower risk of increasing BP. Pt agrees to trial of Modafinil.  Will start Modafinil 100 mg po q am for off-label indication of ADHD. Encouraged pt to call office if Modafinil is not fully effective to discuss possible increased dose. Recommended continuing to track BP and to call office if Modafinil appears to elevate BP.  Pt to follow-up in approximately one month.  Patient advised to contact office with any questions, adverse effects, or acute worsening in signs and symptoms.     Jhalen was seen today for follow-up.  Diagnoses and all orders  for this visit:  Attention deficit hyperactivity disorder (ADHD), combined type -     modafinil (PROVIGIL) 200 MG tablet; Take 1/2-1 tablet daily    Please see After Visit Summary for patient specific instructions.  Future Appointments  Date Time Provider [place]  10/06/2021  1:45 PM Thayer Headings, PMHNP [facility] None  10/06/2021  3:00 PM Cottle, Bambi G, LCSW [facility] None    No orders of the defined types were placed in this encounter.   -------------------------------

## 2021-09-21 DIAGNOSIS — K429 Umbilical hernia without obstruction or gangrene: Secondary | ICD-10-CM | POA: Diagnosis not present

## 2021-10-06 ENCOUNTER — Ambulatory Visit: Payer: BC Managed Care – PPO | Admitting: Psychiatry

## 2021-10-06 ENCOUNTER — Ambulatory Visit (INDEPENDENT_AMBULATORY_CARE_PROVIDER_SITE_OTHER): Payer: BC Managed Care – PPO | Admitting: Psychology

## 2021-10-06 DIAGNOSIS — F4323 Adjustment disorder with mixed anxiety and depressed mood: Secondary | ICD-10-CM | POA: Diagnosis not present

## 2021-10-06 DIAGNOSIS — F900 Attention-deficit hyperactivity disorder, predominantly inattentive type: Secondary | ICD-10-CM | POA: Diagnosis not present

## 2021-10-06 DIAGNOSIS — F330 Major depressive disorder, recurrent, mild: Secondary | ICD-10-CM

## 2021-10-07 ENCOUNTER — Other Ambulatory Visit: Payer: Self-pay

## 2021-10-07 ENCOUNTER — Encounter: Payer: Self-pay | Admitting: Psychiatry

## 2021-10-07 ENCOUNTER — Ambulatory Visit (INDEPENDENT_AMBULATORY_CARE_PROVIDER_SITE_OTHER): Payer: BC Managed Care – PPO | Admitting: Psychiatry

## 2021-10-07 VITALS — BP 129/73 | HR 77

## 2021-10-07 DIAGNOSIS — F902 Attention-deficit hyperactivity disorder, combined type: Secondary | ICD-10-CM

## 2021-10-07 NOTE — Progress Notes (Signed)
Jonathan [name] [id number] 04/05/1961 60 y.o.  Subjective:   Patient ID:  Jonathan [name] is a 60 y.o. (DOB [birth year]) male.  Chief Complaint:  Chief Complaint  Patient presents with   Follow-up    ADHD, depression     HPI Jonathan [name] presents to the office today for follow-up of ADHD and depression. Had hernia surgery 09/21/21 and is recovering. He has not yet picked up Modafinil and has not taken Concerta. He decided not to take medication immediately prior to surgery or while he is recovering. He was out of work until 09/26/21 and immediately went to [place] on vacation until a couple of days ago. He reports that he has been able to perform job with some light duty restrictions and has not felt the need for Concerta.   He denies any recent depression and thinks that past brief depressive s/s were related to sickness. Denies recent anxiety or irritation. Sleeping ok. Slept very well last night. Appetite has been ok. Energy has been lower since surgery. Denies SI.   He reports that BP has been well controlled.   Past Psychiatric Medication Trials: Citalopram- Sexual side effects, fatigue. Partially effective for depression. Increased to 15 mg in the last month and noticed more fatigue. He reports that he has been taking 10 mg most of the time since then.  Trintellix- Improved mood, anxiety, and focus on 5 mg. Excessive somnolence on 10 mg. Pristiq Wellbutrin- Took 25 years ago. Cannot recall response. Concerta  [id number]    [place] Office Visit from 05/05/2021 in [place] at [facility] from 10/19/2020 in [place] at [place] from 08/02/2019 in [place] at [facility] from 10/01/2018 in [place] at [facility] from 05/24/2017 in [place] at [facility] Total Score 2 0 4 0 0  PHQ-9 Total Score 12 9 15  -- --      Flowsheet Row ED from 07/16/2021 in [facility] at [place] Error: Question 6 not populated        Review of Systems:  Review of Systems  Gastrointestinal:        Recovering from hernia surgery  Musculoskeletal:  Negative for gait problem.  Neurological:  Negative for tremors.  Psychiatric/Behavioral:         Please refer to HPI   Medications: I have reviewed the patient's current medications.  Current Outpatient Medications  Medication Sig Dispense Refill   loratadine (CLARITIN) 10 MG tablet Take 10 mg by mouth every other day.     losartan-hydrochlorothiazide (HYZAAR) 50-12.5 MG tablet Take 2 tablets by mouth daily. 180 tablet 1   metFORMIN (GLUCOPHAGE-XR) 500 MG 24 hr tablet Take 1 tablet (500 mg total) by mouth 2 (two) times daily. 180 tablet 1   naproxen (NAPROSYN) 500 MG tablet Take 1 tablet (500 mg total) by mouth 2 (two) times daily. 30 tablet 0   Probiotic Product (PROBIOTIC PO) Take 1 tablet by mouth every other day.     tamsulosin (FLOMAX) 0.4 MG CAPS capsule Take 1 capsule (0.4 mg total) by mouth daily. (Patient taking differently: Take 0.4 mg by mouth every other day.) 15 capsule 0   HYDROcodone-acetaminophen (NORCO/VICODIN) 5-325 MG tablet Take 1-2 tablets by mouth every 6 (six) hours as needed. (Patient not taking: No sig reported) 8 tablet 0   modafinil (PROVIGIL) 200 MG tablet Take 1/2-1 tablet daily (Patient not taking: Reported on 10/07/2021) 30 tablet  0   Multiple Vitamin (MULTIVITAMIN) LIQD Take 5 mLs by mouth daily. (Patient not taking: Reported on 10/07/2021)     No current facility-administered medications for this visit.    Medication Side Effects: Other: N/A  Allergies:  Allergies  Allergen Reactions   Zocor [Simvastatin]     ? myalgias   Penicillins Hives    Per pt - happened in childhood - hives possible reaction    Past Medical History:  Diagnosis Date   Allergic rhinitis    Allergy    Anxiety    Depression    Diabetes mellitus without  complication (HCC)    type 2   Heart murmur    per pt told it was functional   History of kidney stones    History of nephrolithiasis    Hyperlipidemia    Hypertension    Sebaceous cyst     Past Medical History, Surgical history, Social history, and Family history were reviewed and updated as appropriate.   Please see review of systems for further details on the patient's review from today.   Objective:   Physical Exam:  BP 129/73   Pulse 77   Physical Exam Constitutional:      General: He is not in acute distress. Musculoskeletal:        General: No deformity.  Neurological:     Mental Status: He is alert and oriented to person, place, and time.     Coordination: Coordination normal.  Psychiatric:        Attention and Perception: Attention and perception normal. He does not perceive auditory or visual hallucinations.        Mood and Affect: Mood normal. Mood is not anxious or depressed. Affect is not labile, blunt, angry or inappropriate.        Speech: Speech normal.        Behavior: Behavior normal.        Thought Content: Thought content normal. Thought content is not paranoid or delusional. Thought content does not include homicidal or suicidal ideation. Thought content does not include homicidal or suicidal plan.        Cognition and Memory: Cognition and memory normal.        Judgment: Judgment normal.     Comments: Insight intact    Lab Review:     Component Value Date/Time   NA 141 08/17/2021 0000   K 4.3 08/17/2021 0000   CL 102 08/17/2021 0000   CO2 30 (A) 08/17/2021 0000   GLUCOSE 105 (H) 10/19/2020 1206   BUN 16 10/19/2020 1206   CREATININE 1.1 08/17/2021 0000   CREATININE 1.15 10/19/2020 1206   CALCIUM 9.5 08/17/2021 0000   PROT 6.5 10/19/2020 1206   ALBUMIN 3.9 01/05/2020 1014   AST 20 10/19/2020 1206   ALT 31 10/19/2020 1206   ALKPHOS 54 01/05/2020 1014   BILITOT 0.7 10/19/2020 1206   GFRNONAA 72.1 08/17/2021 0000   GFRNONAA 68 10/19/2020  1206   GFRAA 83.5 08/17/2021 0000   GFRAA 79 10/19/2020 1206       Component Value Date/Time   WBC 10.1 10/19/2020 1206   RBC 5.56 10/19/2020 1206   HGB 17.2 (H) 10/19/2020 1206   HCT 49.2 10/19/2020 1206   PLT 250 10/19/2020 1206   MCV 88.5 10/19/2020 1206   MCV 94.2 04/29/2013 0936   MCH 30.9 10/19/2020 1206   MCHC 35.0 10/19/2020 1206   RDW 12.5 10/19/2020 1206   LYMPHSABS 3,485 10/19/2020 1206   MONOABS 0.9 09/05/2018  0754   EOSABS 313 10/19/2020 1206   BASOSABS 81 10/19/2020 1206    No results found for: POCLITH, LITHIUM   No results found for: PHENYTOIN, PHENOBARB, VALPROATE, CBMZ   .res Assessment: Plan:   Pt seen for 30 minutes and time spent reviewing potential benefits and side effects of Modafinil to include discussing option of taking it daily or as needed. He reports that he would like to start trial of Modafinil now that he has recovered from surgery. Discussed continuing to monitor BP, mood, and anxiety. He reports that he will contact office with any questions or side effects.  Recommend continuing to see Bambi Cottle, LCSW.  Pt to follow-up in 4-6 weeks or sooner if clinically indicated.  Patient advised to contact office with any questions, adverse effects, or acute worsening in signs and symptoms.   Jonathan [name] was seen today for follow-up.  Diagnoses and all orders for this visit:  Attention deficit hyperactivity disorder (ADHD), combined type    Please see After Visit Summary for patient specific instructions.  Future Appointments  Date Time Provider [place]  11/04/2021 10:00 AM Cottle, Lucious [place], LCSW [facility] None  11/10/2021  2:00 PM Thayer Headings, PMHNP [facility] None    No orders of the defined types were placed in this encounter.   -------------------------------

## 2021-10-14 DIAGNOSIS — N200 Calculus of kidney: Secondary | ICD-10-CM | POA: Diagnosis not present

## 2021-11-04 ENCOUNTER — Ambulatory Visit (INDEPENDENT_AMBULATORY_CARE_PROVIDER_SITE_OTHER): Payer: BC Managed Care – PPO | Admitting: Psychology

## 2021-11-04 DIAGNOSIS — F330 Major depressive disorder, recurrent, mild: Secondary | ICD-10-CM

## 2021-11-04 DIAGNOSIS — F900 Attention-deficit hyperactivity disorder, predominantly inattentive type: Secondary | ICD-10-CM | POA: Diagnosis not present

## 2021-11-04 DIAGNOSIS — F4323 Adjustment disorder with mixed anxiety and depressed mood: Secondary | ICD-10-CM

## 2021-11-10 ENCOUNTER — Encounter: Payer: Self-pay | Admitting: Psychiatry

## 2021-11-10 ENCOUNTER — Ambulatory Visit (INDEPENDENT_AMBULATORY_CARE_PROVIDER_SITE_OTHER): Payer: BC Managed Care – PPO | Admitting: Psychiatry

## 2021-11-10 ENCOUNTER — Other Ambulatory Visit: Payer: Self-pay

## 2021-11-10 DIAGNOSIS — F902 Attention-deficit hyperactivity disorder, combined type: Secondary | ICD-10-CM

## 2021-11-10 MED ORDER — MODAFINIL 200 MG PO TABS: ORAL_TABLET | ORAL | 0 refills | Status: DC

## 2021-11-10 NOTE — Progress Notes (Signed)
Jonathan [name] [id number] 11-07-1961 60 y.o.  Subjective:   Patient ID:  Jonathan [name] is a 60 y.o. (DOB 1961/11/11) male.  Chief Complaint:  Chief Complaint  Patient presents with   Follow-up    Anxiety, Depression, and ADHD    HPI AXTYN WOEHLER presents to the office today for follow-up of ADHD, anxiety, and depression. "Best I have been in a long time." He reports that he takes 1/2 tab of Modafinil and has not felt the need for a second dose. Has not taken Modafinil on days off. He reports increased energy. He reports that he does not feel as lethargic. Notices improved motivation. He reports that he has had less irritability. Denies anxiety. Describes improved "outlook" and mood. He reports that concentration has been adequate. Has been able to sustain focus and problem-solve. 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 Reports that he is sleeping well and sleep quality has improved. Appetite has been lower. Estimates some weight loss. Denies SI.   BP has been stable.   Continues to see Bambi Cottle, LCSW.   Past Psychiatric Medication Trials: Citalopram- Sexual side effects, fatigue. Partially effective for depression. Increased to 15 mg in the last month and noticed more fatigue. He reports that he has been taking 10 mg most of the time since then.  Trintellix- Improved mood, anxiety, and focus on 5 mg. Excessive somnolence on 10 mg. Pristiq Wellbutrin- Took 25 years ago. Cannot recall response. Concerta Modafinil  [id number]    Flowsheet Row Office Visit from 05/05/2021 in [place] at [facility] from 10/19/2020 in [place] at [facility] from 08/02/2019 in [place] at [facility] from 10/01/2018 in [place] at [facility] from 05/24/2017 in [place] at [facility] Total Score 2 0 4 0 0  PHQ-9 Total Score 12 9 15  -- --      Flowsheet Row ED from 07/16/2021 in [facility] at [place] Error: Question 6 not populated        Review of Systems:  Review of Systems  Cardiovascular:  Negative for chest pain and palpitations.  Musculoskeletal:  Negative for gait problem.  Psychiatric/Behavioral:         Please refer to HPI  Has recovered from hernia surgery  Medications: I have reviewed the patient's current medications.  Current Outpatient Medications  Medication Sig Dispense Refill   loratadine (CLARITIN) 10 MG tablet Take 10 mg by mouth every other day.     losartan-hydrochlorothiazide (HYZAAR) 50-12.5 MG tablet Take 2 tablets by mouth daily. 180 tablet 1   metFORMIN (GLUCOPHAGE-XR) 500 MG 24 hr tablet Take 1 tablet (500 mg total) by mouth 2 (two) times daily. 180 tablet 1   Probiotic Product (PROBIOTIC PO) Take 1 tablet by mouth every other day.     HYDROcodone-acetaminophen (NORCO/VICODIN) 5-325 MG tablet Take 1-2 tablets by mouth every 6 (six) hours as needed. (Patient not taking: Reported on 08/05/2021) 8 tablet 0   modafinil (PROVIGIL) 200 MG tablet Take 1/2-1 tablet daily 90 tablet 0   Multiple Vitamin (MULTIVITAMIN) LIQD Take 5 mLs by mouth daily. (Patient not taking: Reported on 10/07/2021)     naproxen (NAPROSYN) 500 MG tablet Take 1 tablet (500 mg total) by mouth 2 (two) times daily. (Patient not taking: Reported on 11/10/2021) 30 tablet 0  tamsulosin (FLOMAX) 0.4 MG CAPS capsule Take 1 capsule (0.4 mg total) by mouth daily. (Patient not taking: Reported on 11/10/2021) 15 capsule 0   No current facility-administered medications for this visit.    Medication Side Effects: None  Allergies:  Allergies  Allergen Reactions   Zocor [Simvastatin]     ? myalgias   Penicillins Hives    Per pt - happened in childhood - hives possible reaction    Past Medical History:  Diagnosis Date    Allergic rhinitis    Allergy    Anxiety    Depression    Diabetes mellitus without complication (HCC)    type 2   Heart murmur    per pt told it was functional   History of kidney stones    History of nephrolithiasis    Hyperlipidemia    Hypertension    Sebaceous cyst     Past Medical History, Surgical history, Social history, and Family history were reviewed and updated as appropriate.   Please see review of systems for further details on the patient's review from today.   Objective:   Physical Exam:  BP 117/69   Pulse 77   Physical Exam Constitutional:      General: He is not in acute distress. Musculoskeletal:        General: No deformity.  Neurological:     Mental Status: He is alert and oriented to person, place, and time.     Coordination: Coordination normal.  Psychiatric:        Attention and Perception: Attention and perception normal. He does not perceive auditory or visual hallucinations.        Mood and Affect: Mood normal. Mood is not anxious or depressed. Affect is not labile, blunt, angry or inappropriate.        Speech: Speech normal.        Behavior: Behavior normal.        Thought Content: Thought content normal. Thought content is not paranoid or delusional. Thought content does not include homicidal or suicidal ideation. Thought content does not include homicidal or suicidal plan.        Cognition and Memory: Cognition and memory normal.        Judgment: Judgment normal.     Comments: Insight intact    Lab Review:     Component Value Date/Time   NA 141 08/17/2021 0000   K 4.3 08/17/2021 0000   CL 102 08/17/2021 0000   CO2 30 (A) 08/17/2021 0000   GLUCOSE 105 (H) 10/19/2020 1206   BUN 16 10/19/2020 1206   CREATININE 1.1 08/17/2021 0000   CREATININE 1.15 10/19/2020 1206   CALCIUM 9.5 08/17/2021 0000   PROT 6.5 10/19/2020 1206   ALBUMIN 3.9 01/05/2020 1014   AST 20 10/19/2020 1206   ALT 31 10/19/2020 1206   ALKPHOS 54 01/05/2020 1014    BILITOT 0.7 10/19/2020 1206   GFRNONAA 72.1 08/17/2021 0000   GFRNONAA 68 10/19/2020 1206   GFRAA 83.5 08/17/2021 0000   GFRAA 79 10/19/2020 1206       Component Value Date/Time   WBC 10.1 10/19/2020 1206   RBC 5.56 10/19/2020 1206   HGB 17.2 (H) 10/19/2020 1206   HCT 49.2 10/19/2020 1206   PLT 250 10/19/2020 1206   MCV 88.5 10/19/2020 1206   MCV 94.2 04/29/2013 0936   MCH 30.9 10/19/2020 1206   MCHC 35.0 10/19/2020 1206   RDW 12.5 10/19/2020 1206   LYMPHSABS 3,485 10/19/2020 1206   MONOABS  0.9 09/05/2018 0754   EOSABS 313 10/19/2020 1206   BASOSABS 81 10/19/2020 1206    No results found for: POCLITH, LITHIUM   No results found for: PHENYTOIN, PHENOBARB, VALPROATE, CBMZ   .res Assessment: Plan:   Will continue Modafinil 100 mg daily since he reports improved mood, anxiety, energy, and concentration with Modafinil. Discussed that insurance is unlikely to cover Modafinil for off-label indications and recommend using Good Rx discount. He plans to continue to use [facility] discount at [facility] for Modafinil. Will send 90-day supply since his other medications are filled at another pharmacy.  Recommend continuing therapy with Bambi Cottle, LCSW.  Pt to follow-up with this provider in 1-2 months or sooner if clinically indicated.  Patient advised to contact office with any questions, adverse effects, or acute worsening in signs and symptoms.  [place] was seen today for follow-up.  Diagnoses and all orders for this visit:  Attention deficit hyperactivity disorder (ADHD), combined type -     modafinil (PROVIGIL) 200 MG tablet; Take 1/2-1 tablet daily    Please see After Visit Summary for patient specific instructions.  Future Appointments  Date Time Provider [place]  12/29/2021  8:30 AM Thayer Headings, PMHNP CP-CP None     No orders of the defined types were placed in this encounter.   -------------------------------

## 2021-12-09 ENCOUNTER — Other Ambulatory Visit: Payer: Self-pay

## 2021-12-09 ENCOUNTER — Ambulatory Visit (INDEPENDENT_AMBULATORY_CARE_PROVIDER_SITE_OTHER): Payer: BC Managed Care – PPO | Admitting: Psychology

## 2021-12-09 DIAGNOSIS — F902 Attention-deficit hyperactivity disorder, combined type: Secondary | ICD-10-CM | POA: Diagnosis not present

## 2021-12-09 DIAGNOSIS — F4323 Adjustment disorder with mixed anxiety and depressed mood: Secondary | ICD-10-CM

## 2021-12-09 DIAGNOSIS — F330 Major depressive disorder, recurrent, mild: Secondary | ICD-10-CM | POA: Diagnosis not present

## 2021-12-09 DIAGNOSIS — Z630 Problems in relationship with spouse or partner: Secondary | ICD-10-CM

## 2021-12-09 NOTE — Progress Notes (Signed)
[facility] Counselor/Therapist Progress Note  Patient ID: Jonathan [name], MRN: [id number],    Date: 12/09/2021  Time Spent: 60 minutes  Treatment Type: Individual Therapy  Reported Symptoms: sadness, inability to pay attention  Mental Status Exam: Appearance:  Casual     Behavior: Appropriate  Motor: Normal  Speech/Language:  Normal Rate  Affect: Appropriate  Mood: normal  Thought process: normal  Thought content:   WNL  Sensory/Perceptual disturbances:   WNL  Orientation: oriented to person, place, time/date, and situation  Attention: Good  Concentration: Good  Memory: WNL  Fund of knowledge:  Good  Insight:   Good  Judgment:  Good  Impulse Control: Good   Risk Assessment: Danger to Self:  No Self-injurious Behavior: No Danger to Others: No Duty to Warn:no Physical Aggression / Violence:No  Access to Firearms a concern: No  Gang Involvement:No   Subjective: The patient attended a face-to-face individual therapy session in the office today.  The patient presents as pleasant and cooperative.  The patient reports that he and his wife marriage therapist is retiring and he is looking for someone to start seeing.  I gave him a couple of names and will continue to think about who to possibly to refer them to.  We talked today about the struggles that he and his wife have around intimacy and sex and processed the possibility that she may be struggling with taking on her daughter's issues.  The patient reports that the problem that his wife has with him is that he does not take on as much responsibility as she does.  When processing this it seems that may be his wife might be taking on more than her responsibility in regards to her daughter by her own choice.  We talked about having a better understanding of this when he goes to deal with her and to understand that this hopefully will be short lived.  Scheduled another appointment for a month.  Interventions:  Cognitive Behavioral Therapy and Insight-Oriented  Diagnosis:Mild episode of recurrent major depressive disorder ([place])  Attention deficit hyperactivity disorder (ADHD), combined type  Marital problems  Adjustment disorder with mixed anxiety and depressed mood  Plan: Please see treatment plan in Therapy charts with target date of 09/15/2022.  The patient approved this plan and is making progress.  Adileny Delon G Niah Heinle, LCSW

## 2021-12-29 ENCOUNTER — Encounter: Payer: Self-pay | Admitting: Psychiatry

## 2021-12-29 ENCOUNTER — Ambulatory Visit (INDEPENDENT_AMBULATORY_CARE_PROVIDER_SITE_OTHER): Payer: BC Managed Care – PPO | Admitting: Psychiatry

## 2021-12-29 ENCOUNTER — Other Ambulatory Visit: Payer: Self-pay

## 2021-12-29 VITALS — BP 157/83 | HR 94

## 2021-12-29 DIAGNOSIS — F3341 Major depressive disorder, recurrent, in partial remission: Secondary | ICD-10-CM | POA: Diagnosis not present

## 2021-12-29 DIAGNOSIS — F902 Attention-deficit hyperactivity disorder, combined type: Secondary | ICD-10-CM | POA: Diagnosis not present

## 2021-12-29 MED ORDER — MODAFINIL 200 MG PO TABS: ORAL_TABLET | ORAL | 0 refills | Status: DC

## 2021-12-29 NOTE — Progress Notes (Signed)
Jonathan [name] [id number] 1961-06-04 61 y.o.  Subjective:   Patient ID:  Jonathan [name] is a 61 y.o. (DOB 06-26-1961) male.  Chief Complaint:  Chief Complaint  Patient presents with   Follow-up    ADHD and depression    HPI Jonathan [name] presents to the office today for follow-up of ADHD and depression. He reports, "I think we found the right mixture." He reports that only on a few occasions has he needed a 1/2 tablet in the afternoon and this has been helpful. He has used the additional 1/2 tab on longer days and reports that this has not negatively affected his sleep. He reports that his appetite has been slightly suppressed and continues to eat an adequate amount. He reports that his mood has been "good."  He noticed some mild depression at the end of the year and that this may have been related to increased responsibilities. He reports that Modafinil has been helpful for his energy. Motivation has been good. Denies irritability. He notices a decreased need to complain and have someone hear it. He reports adequate concentration and clarity. Was able to get through holidays without Web designer. Adequate sleep overall with occasional sleep disturbance when there are multiple things going on. Denies SI.   Daughter is back in school in [place]. He has a Optometrist.   Continues to see Bambi Cottle, LCSW.    Past Psychiatric Medication Trials: Citalopram- Sexual side effects, fatigue. Partially effective for depression. Increased to 15 mg in the last month and noticed more fatigue. He reports that he has been taking 10 mg most of the time since then.  Trintellix- Improved mood, anxiety, and focus on 5 mg. Excessive somnolence on 10 mg. Pristiq Wellbutrin- Took 25 years ago. Cannot recall response. Concerta Modafinil    KGM0-1    Flowsheet Row Office Visit from 05/05/2021 in [facility] at [facility] from 10/19/2020 in [place] at [facility] from 08/02/2019 in [facility] at [facility] from 10/01/2018 in [place] at [facility] from 05/24/2017 in [place] at [facility] Total Score 2 0 4 0 0  PHQ-9 Total Score 12 9 15  -- --      Flowsheet Row ED from 07/16/2021 in [facility] at [place] Error: Question 6 not populated        Review of Systems:  Review of Systems  Cardiovascular:        Reports BP has been stable.   Musculoskeletal:  Negative for gait problem.  Neurological:  Negative for tremors.  Psychiatric/Behavioral:         Please refer to HPI   Medications: I have reviewed the patient's current medications.  Current Outpatient Medications  Medication Sig Dispense Refill   loratadine (CLARITIN) 10 MG tablet Take 10 mg by mouth every other day.     losartan-hydrochlorothiazide (HYZAAR) 50-12.5 MG tablet Take 2 tablets by mouth daily. 180 tablet 1   metFORMIN (GLUCOPHAGE-XR) 500 MG 24 hr tablet Take 1 tablet (500 mg total) by mouth 2 (two) times daily. 180 tablet 1   Probiotic Product (PROBIOTIC PO) Take 1 tablet by mouth every other day.     HYDROcodone-acetaminophen (NORCO/VICODIN) 5-325 MG tablet Take 1-2 tablets by mouth every 6 (six) hours as needed. (Patient not taking: Reported on 08/05/2021) 8 tablet 0   [START ON 01/19/2022] modafinil (PROVIGIL) 200 MG tablet Take 1/2 once to twice daily 90  tablet 0   Multiple Vitamin (MULTIVITAMIN) LIQD Take 5 mLs by mouth daily. (Patient not taking: Reported on 10/07/2021)     naproxen (NAPROSYN) 500 MG tablet Take 1 tablet (500 mg total) by mouth 2 (two) times daily. (Patient not taking: Reported on 11/10/2021) 30 tablet 0   tamsulosin (FLOMAX) 0.4 MG CAPS capsule Take 1 capsule (0.4 mg total) by mouth daily. (Patient not taking: Reported on 11/10/2021) 15 capsule 0   No current facility-administered medications for this visit.     Medication Side Effects: Appetite Suppression  Allergies:  Allergies  Allergen Reactions   Zocor [Simvastatin]     ? myalgias   Penicillins Hives    Per pt - happened in childhood - hives possible reaction    Past Medical History:  Diagnosis Date   Allergic rhinitis    Allergy    Anxiety    Depression    Diabetes mellitus without complication (HCC)    type 2   Heart murmur    per pt told it was functional   History of kidney stones    History of nephrolithiasis    Hyperlipidemia    Hypertension    Sebaceous cyst     Past Medical History, Surgical history, Social history, and Family history were reviewed and updated as appropriate.   Please see review of systems for further details on the patient's review from today.   Objective:   Physical Exam:  BP (!) 157/83    Pulse 94   Physical Exam Constitutional:      General: He is not in acute distress. Musculoskeletal:        General: No deformity.  Neurological:     Mental Status: He is alert and oriented to person, place, and time.     Coordination: Coordination normal.  Psychiatric:        Attention and Perception: Attention and perception normal. He does not perceive auditory or visual hallucinations.        Mood and Affect: Mood normal. Mood is not anxious or depressed. Affect is not labile, blunt, angry or inappropriate.        Speech: Speech normal.        Behavior: Behavior normal.        Thought Content: Thought content normal. Thought content is not paranoid or delusional. Thought content does not include homicidal or suicidal ideation. Thought content does not include homicidal or suicidal plan.        Cognition and Memory: Cognition and memory normal.        Judgment: Judgment normal.     Comments: Insight intact    Lab Review:     Component Value Date/Time   NA 141 08/17/2021 0000   K 4.3 08/17/2021 0000   CL 102 08/17/2021 0000   CO2 30 (A) 08/17/2021 0000   GLUCOSE 105 (H) 10/19/2020 1206    BUN 16 10/19/2020 1206   CREATININE 1.1 08/17/2021 0000   CREATININE 1.15 10/19/2020 1206   CALCIUM 9.5 08/17/2021 0000   PROT 6.5 10/19/2020 1206   ALBUMIN 3.9 01/05/2020 1014   AST 20 10/19/2020 1206   ALT 31 10/19/2020 1206   ALKPHOS 54 01/05/2020 1014   BILITOT 0.7 10/19/2020 1206   GFRNONAA 72.1 08/17/2021 0000   GFRNONAA 68 10/19/2020 1206   GFRAA 83.5 08/17/2021 0000   GFRAA 79 10/19/2020 1206       Component Value Date/Time   WBC 10.1 10/19/2020 1206   RBC 5.56 10/19/2020 1206   HGB  17.2 (H) 10/19/2020 1206   HCT 49.2 10/19/2020 1206   PLT 250 10/19/2020 1206   MCV 88.5 10/19/2020 1206   MCV 94.2 04/29/2013 0936   MCH 30.9 10/19/2020 1206   MCHC 35.0 10/19/2020 1206   RDW 12.5 10/19/2020 1206   LYMPHSABS 3,485 10/19/2020 1206   MONOABS 0.9 09/05/2018 0754   EOSABS 313 10/19/2020 1206   BASOSABS 81 10/19/2020 1206    No results found for: POCLITH, LITHIUM   No results found for: PHENYTOIN, PHENOBARB, VALPROATE, CBMZ   .res Assessment: Plan:   Will continue Modafinil 200 mg 1/2 tablet once to twice daily for off-label indication of ADHD.  Pt to follow-up in 3 months or sooner if clinically indicated.  Recommend continuing therapy with Bambi Cottle, LCSW.  Patient advised to contact office with any questions, adverse effects, or acute worsening in signs and symptoms.  Jonathan [name] was seen today for follow-up.  Diagnoses and all orders for this visit:  Attention deficit hyperactivity disorder (ADHD), combined type -     modafinil (PROVIGIL) 200 MG tablet; Take 1/2 once to twice daily  Recurrent major depressive disorder, in partial remission ([place])     Please see After Visit Summary for patient specific instructions.  Future Appointments  Date Time Provider [place]  01/06/2022  3:00 PM Cottle, [place], LCSW [facility] None  03/29/2022  8:30 AM Thayer Headings, PMHNP [facility] None    No orders of the defined types were placed in this  encounter.   -------------------------------

## 2022-01-06 ENCOUNTER — Ambulatory Visit (INDEPENDENT_AMBULATORY_CARE_PROVIDER_SITE_OTHER): Payer: BC Managed Care – PPO | Admitting: Psychology

## 2022-01-06 DIAGNOSIS — F3341 Major depressive disorder, recurrent, in partial remission: Secondary | ICD-10-CM

## 2022-01-06 DIAGNOSIS — F902 Attention-deficit hyperactivity disorder, combined type: Secondary | ICD-10-CM

## 2022-01-06 DIAGNOSIS — Z630 Problems in relationship with spouse or partner: Secondary | ICD-10-CM

## 2022-01-06 NOTE — Progress Notes (Signed)
[facility] Counselor/Therapist Progress Note  Patient ID: PHENG PROKOP, MRN: [id number],    Date: 01/06/2022  Time Spent: 60 minutes  Treatment Type: Individual Therapy  Reported Symptoms: sadness, inability to pay attention  Mental Status Exam: Appearance:  Casual     Behavior: Appropriate  Motor: Normal  Speech/Language:  Normal Rate  Affect: Appropriate  Mood: normal  Thought process: normal  Thought content:   WNL  Sensory/Perceptual disturbances:   WNL  Orientation: oriented to person, place, time/date, and situation  Attention: Good  Concentration: Good  Memory: WNL  Fund of knowledge:  Good  Insight:   Good  Judgment:  Good  Impulse Control: Good   Risk Assessment: Danger to Self:  No Self-injurious Behavior: No Danger to Others: No Duty to Warn:no Physical Aggression / Violence:No  Access to Firearms a concern: No  Gang Involvement:No   Subjective: The patient attended a face-to-face individual therapy session in the office today.  The patient presents as pleasant and cooperative.  The patient states that he has been doing better since our last session.  He reports that it was helpful for him to have some reframing and he feels like it is helpful for him to meet with me monthly to just do a check-in to keep him on track.  The patient states that he has been better able to interact with his wife and has different expectations because of our conversation the last time.  The patient is doing well with work and at home and feels like he is at a point where he can have a break since the holidays are over now.  He talked about how difficult last year was with all of his family's health problems and juggling work.  The patient is doing very well and we will continue to meet monthly.  Interventions: Cognitive Behavioral Therapy and Insight-Oriented  Diagnosis:Attention deficit hyperactivity disorder (ADHD), combined type  Recurrent major depressive  disorder, in partial remission ([place])  Marital problems  Plan: Please see treatment plan in Therapy charts with target date of 09/15/2022.  The patient approved this plan and is making progress.  Ulus Hazen G Ashutosh Dieguez, LCSW                  Brandye Inthavong G Diella Gillingham, LCSW

## 2022-01-19 IMAGING — CT CT RENAL STONE PROTOCOL
2 of 4 series · 15 of 46 positions shown, 17 images · non-contrast
Comparison: None.

CLINICAL DATA: Flank pain.  Kidney stone suspected.

EXAM:
CT ABDOMEN AND PELVIS WITHOUT CONTRAST
TECHNIQUE: Multidetector CT imaging of the abdomen and pelvis was performed
following the standard protocol without IV contrast.

[Series 3: renal stone 5.0 · axial · 0.86mm/px · z∈[+844,+1299]mm · 12 of 101 slices shown, 14 images]
[im 5/101  soft-tissue]
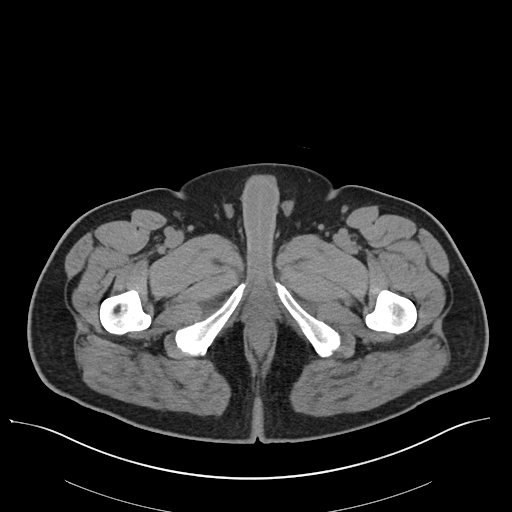
[im 5/101  bone]
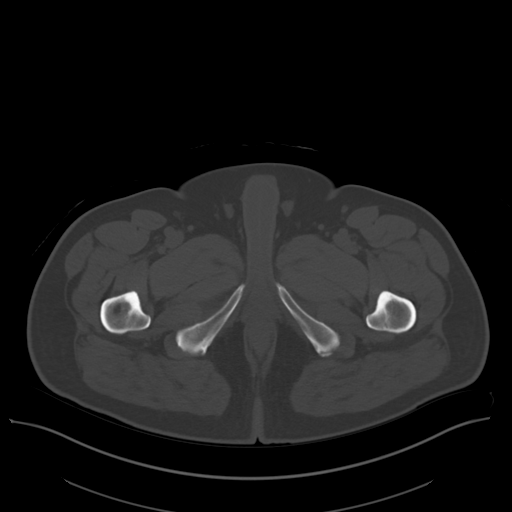
[im 14/101  soft-tissue]
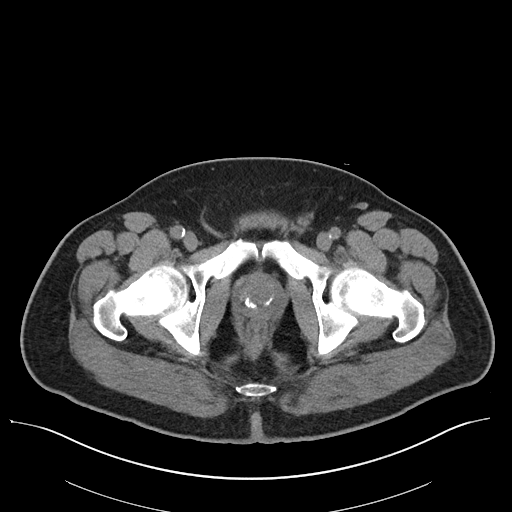
[im 22/101  soft-tissue]
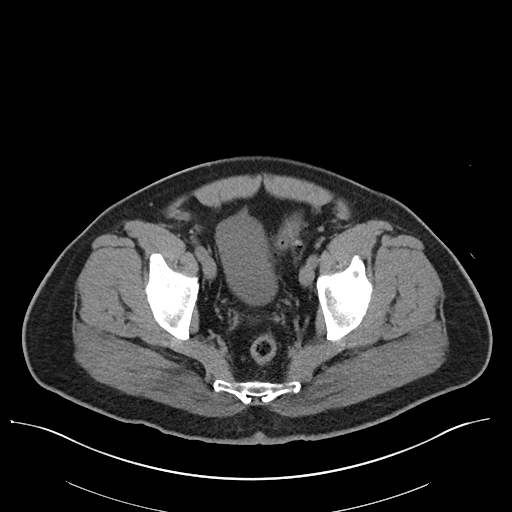
[im 31/101  soft-tissue]
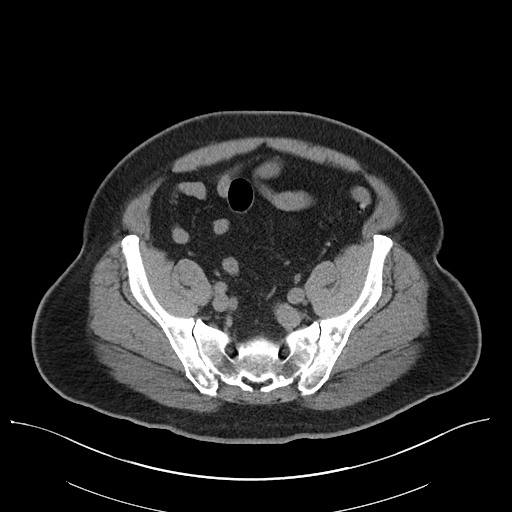
[im 40/101  soft-tissue]
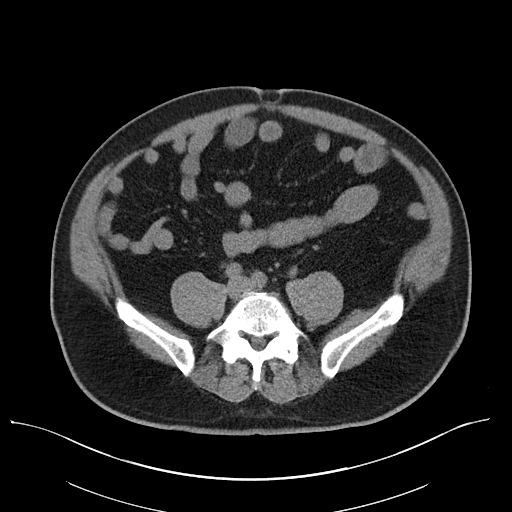
[im 48/101  soft-tissue]
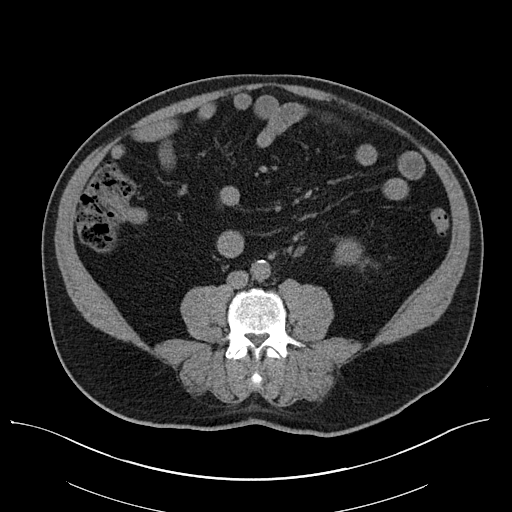
[im 53/101  soft-tissue]
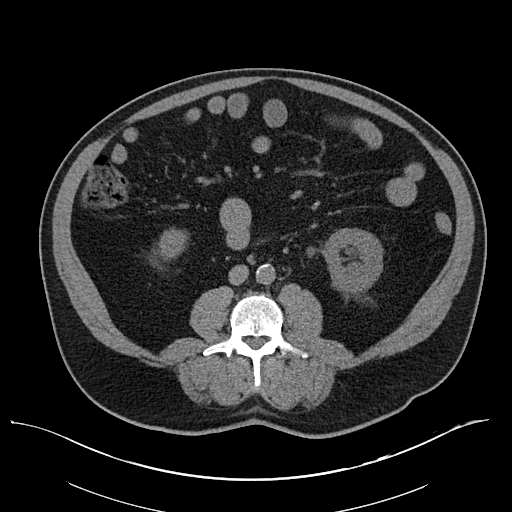
[im 61/101  soft-tissue]
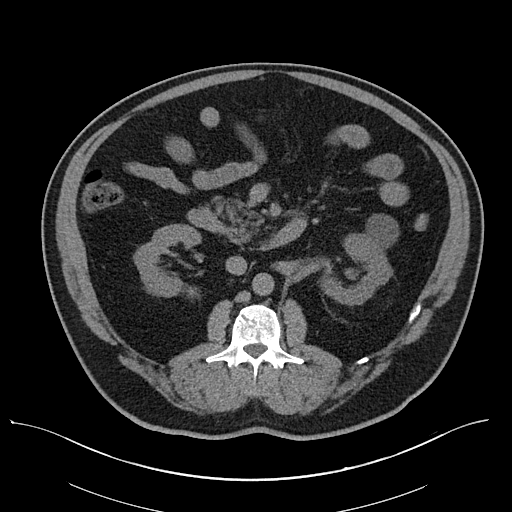
[im 70/101  soft-tissue]
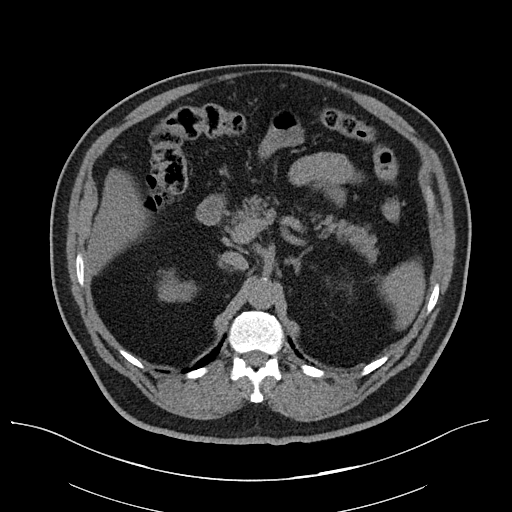
[im 70/101  bone]
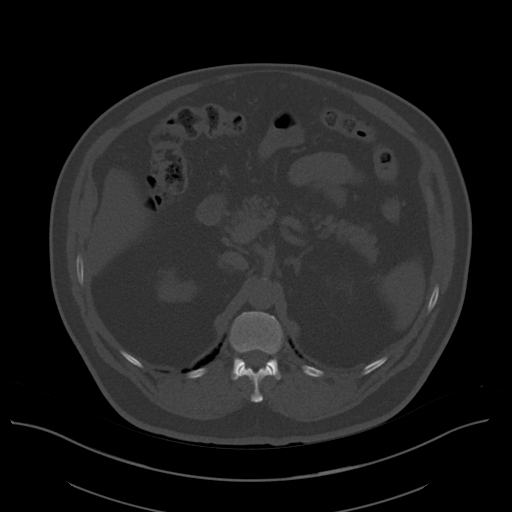
[im 79/101  soft-tissue]
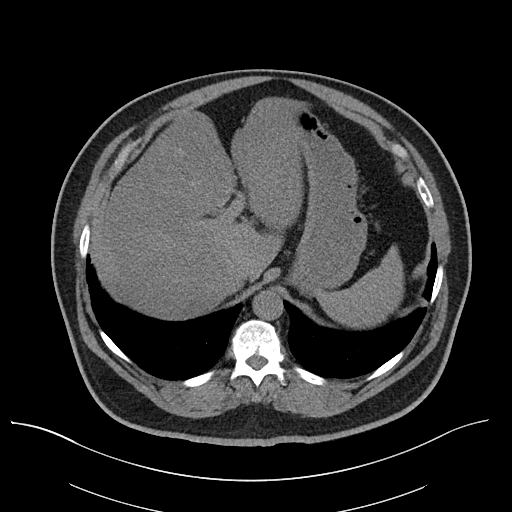
[im 87/101  soft-tissue]
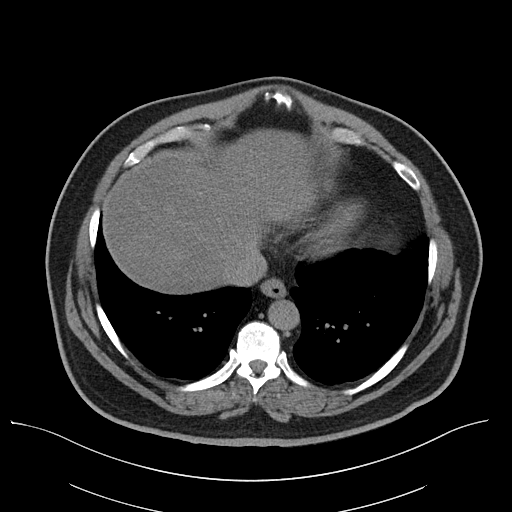
[im 96/101  soft-tissue]
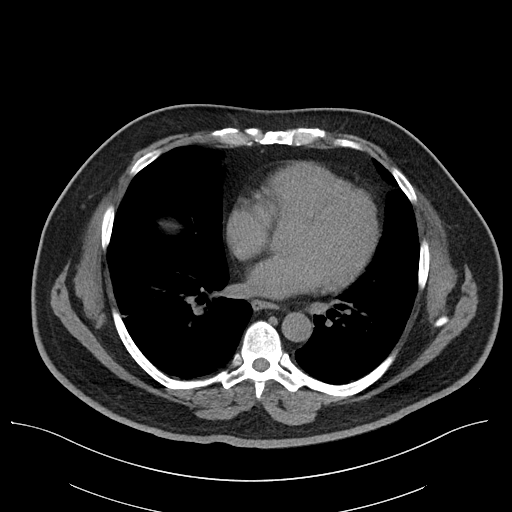

[Series 5: renal stone 3.0 cor · coronal · 0.81mm/px · 3 of 103 slices shown]
[im 35/103  soft-tissue]
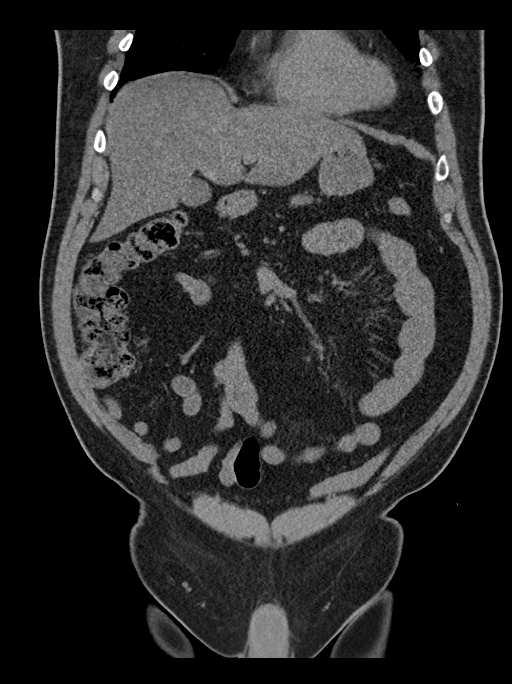
[im 46/103  soft-tissue]
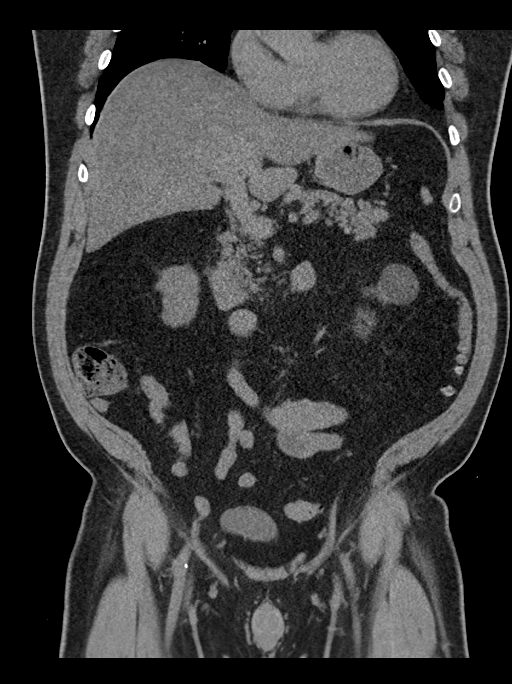
[im 57/103  soft-tissue]
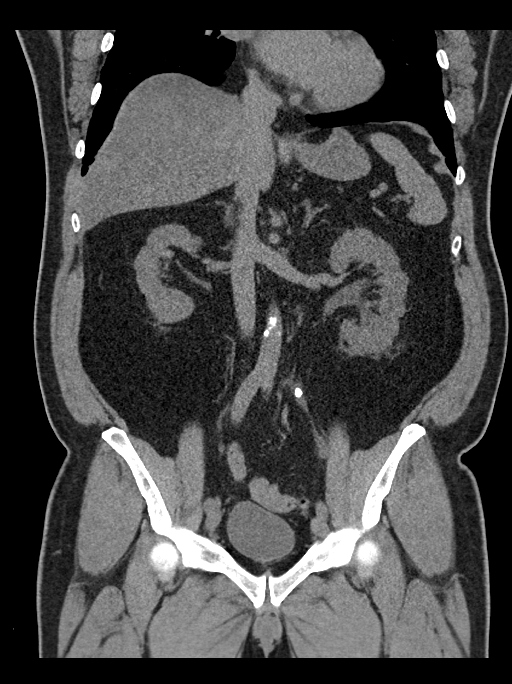

[15 of 46 positions shown; findings below may reference images not displayed]

FINDINGS: Lower chest: 4 pulmonary nodules are seen in the lung bases. The
largest nodule on the left is seen on axial image 23 and coronal
image 67 measuring 10 x 9 by 6 mm measuring 8 mm in mean dimension.
Smaller nodules are seen on the left on axial images 30 and 19. A 5
mm nodule seen in the right base on axial image 8 as well. Scattered
atelectasis is seen in the bases. Lower chest otherwise normal.

Hepatobiliary: Hepatic steatosis.  The gallbladder is unremarkable.

Pancreas: Unremarkable. No pancreatic ductal dilatation or
surrounding inflammatory changes.

Spleen: Normal in size without focal abnormality.

Adrenals/Urinary Tract: Adrenal glands are normal. No right-sided
renal stones are identified. There is a 3 mm stone in the left
kidney on coronal image 61. Mild left hydronephrosis and perinephric
stranding is identified. There is a 6 mm stone in the mid left
ureter on coronal image 57. The left ureter is mildly dilated
proximal to this stone. There is also periureteral stranding. No
other left ureteral stones are noted. No right-sided hydronephrosis,
perinephric stranding, ureterectasis, or ureteral stones. An
exophytic cyst is seen off the left kidney. The bladder is
unremarkable.

Stomach/Bowel: The stomach and small bowel are normal. Colonic
diverticulosis is seen primarily in the sigmoid and distal
descending colon. The remainder of the colon is normal in
appearance. The appendix is normal.

Vascular/Lymphatic: Mild atherosclerotic changes are seen in the
nonaneurysmal aorta.

Reproductive: Prostate is unremarkable.

Other: No free air free fluid. A fat containing umbilical hernia is
noted.

Musculoskeletal: No acute or significant osseous findings.
IMPRESSION: 1. There is a 6 mm stone in the mid left ureter resulting in mild
hydronephrosis, perinephric stranding, mild proximal ureterectasis,
and periureteral stranding.
2. There is a 3 mm stone in the left kidney. No right-sided renal
stones.
3. 4 pulmonary nodules are identified. The largest is seen in the
left lung with a mean diameter of 8 mm. Non-contrast chest CT at
6-12 months is recommended. If the nodule is stable at time of
repeat CT, then future CT at 18-24 months (from today's scan) is
considered optional for low-risk patients, but is recommended for
high-risk patients. This recommendation follows the consensus
statement: Guidelines for Management of Incidental Pulmonary Nodules
Detected on CT Images: From the [HOSPITAL] 4100; Radiology
4100; [DATE].
4. Hepatic steatosis.
5. Colonic diverticulosis without diverticulitis.
6. Mild atherosclerotic changes in the nonaneurysmal aorta.
7. Small fat containing umbilical hernia.

## 2022-02-03 ENCOUNTER — Ambulatory Visit (INDEPENDENT_AMBULATORY_CARE_PROVIDER_SITE_OTHER): Payer: BC Managed Care – PPO | Admitting: Psychology

## 2022-02-03 ENCOUNTER — Other Ambulatory Visit: Payer: Self-pay

## 2022-02-03 DIAGNOSIS — F902 Attention-deficit hyperactivity disorder, combined type: Secondary | ICD-10-CM

## 2022-02-03 DIAGNOSIS — F3341 Major depressive disorder, recurrent, in partial remission: Secondary | ICD-10-CM | POA: Diagnosis not present

## 2022-02-03 DIAGNOSIS — Z630 Problems in relationship with spouse or partner: Secondary | ICD-10-CM | POA: Diagnosis not present

## 2022-02-03 NOTE — Progress Notes (Signed)
[facility] Counselor/Therapist Progress Note ? ?Patient ID: Jonathan [name], MRN: [id number],   ? ?Date: 02/03/2022 ? ?Time Spent: 50 minutes ? ?Treatment Type: Individual Therapy ? ?Reported Symptoms: sadness, inability to pay attention ? ?Mental Status Exam: ?Appearance:  Casual     ?Behavior: Appropriate  ?Motor: Normal  ?Speech/Language:  Normal Rate  ?Affect: Appropriate  ?Mood: normal  ?Thought process: normal  ?Thought content:   WNL  ?Sensory/Perceptual disturbances:   WNL  ?Orientation: oriented to person, place, time/date, and situation  ?Attention: Good  ?Concentration: Good  ?Memory: WNL  ?Fund of knowledge:  Good  ?Insight:   Good  ?Judgment:  Good  ?Impulse Control: Good  ? ?Risk Assessment: ?Danger to Self:  No ?Self-injurious Behavior: No ?Danger to Others: No ?Duty to Warn:no ?Physical Aggression / Violence:No  ?Access to Firearms a concern: No  ?Gang Involvement:No  ? ?Subjective: The patient attended a face-to-face individual therapy session in the office today.  The patient presents as pleasant and cooperative.  The patient reports that he has been doing well since the last time I saw him.  The patient wanted to talk today about his inability to prioritize himself.  We processed the situation and it seems that he struggles with taking care of what he needs to do and sees things all as equal in importance.  We talked about the solution being that he taking nice deep breath and palls before he reacts.  We talked about how to identify when he needs to do this.  The patient understood concepts discussed. ? ?Interventions: Cognitive Behavioral Therapy and Insight-Oriented ? ?Diagnosis:Attention deficit hyperactivity disorder (ADHD), combined type ? ?Recurrent major depressive disorder, in partial remission ([place]) ? ?Marital problems ? ?Plan: Please see treatment plan in Therapy charts with target date of 09/15/2022.  The patient approved this plan and is making progress. ? ?Ilean Spradlin G  Krayton Wortley, LCSW ? ? ? ? ? ? ? ? ? ? ? ? ? ? ? ? ? ?Draysen Weygandt G Masiyah Jorstad, LCSW ? ? ? ? ? ? ? ? ? ? ? ? ? ? ?Raima Geathers G Derold Dorsch, LCSW ?

## 2022-03-10 ENCOUNTER — Ambulatory Visit (INDEPENDENT_AMBULATORY_CARE_PROVIDER_SITE_OTHER): Payer: BC Managed Care – PPO | Admitting: Psychology

## 2022-03-10 DIAGNOSIS — F902 Attention-deficit hyperactivity disorder, combined type: Secondary | ICD-10-CM

## 2022-03-10 DIAGNOSIS — F3341 Major depressive disorder, recurrent, in partial remission: Secondary | ICD-10-CM | POA: Diagnosis not present

## 2022-03-10 NOTE — Progress Notes (Signed)
[facility] Counselor/Therapist Progress Note ? ?Patient ID: Jonathan [name], MRN: [id number],   ? ?Date: 03/10/2022 ? ?Time Spent: 60 minutes ? ?Treatment Type: Individual Therapy ? ?Reported Symptoms: sadness, inability to pay attention ? ?Mental Status Exam: ?Appearance:  Casual     ?Behavior: Appropriate  ?Motor: Normal  ?Speech/Language:  Normal Rate  ?Affect: Appropriate  ?Mood: normal  ?Thought process: normal  ?Thought content:   WNL  ?Sensory/Perceptual disturbances:   WNL  ?Orientation: oriented to person, place, time/date, and situation  ?Attention: Good  ?Concentration: Good  ?Memory: WNL  ?Fund of knowledge:  Good  ?Insight:   Good  ?Judgment:  Good  ?Impulse Control: Good  ? ?Risk Assessment: ?Danger to Self:  No ?Self-injurious Behavior: No ?Danger to Others: No ?Duty to Warn:no ?Physical Aggression / Violence:No  ?Access to Firearms a concern: No  ?Gang Involvement:No  ? ?Subjective: The patient attended a face-to-face individual therapy session in the office today.  The patient presents as pleasant and cooperative.  The patient reports that it has been a busy time for he and his family.  He states that his wife's mother passed away in the last few weeks in [place].  The patient talked about having to do all that he needed to do for work and also for home.  Today the patient brought up wanting to work on his issue with being grumpy at home.  We processed what was happening a little more and came back around to the fact that he might be being a little passive-aggressive by being grumpy and huffing and puffing around the house.  We talked about how to express himself and how he feels without blaming by his behavior.  The patient understood the concepts discussed and plans to work on that between now and the next session. ? ?Interventions: Cognitive Behavioral Therapy and Insight-Oriented ? ?Diagnosis:Attention deficit hyperactivity disorder (ADHD), combined type ? ?Recurrent major  depressive disorder, in partial remission ([place]) ? ?Plan: Please see treatment plan in Therapy charts with target date of 09/15/2022.  The patient approved this plan and is making progress. ? ?Adolphe Fortunato G Amal Renbarger, LCSW ? ? ? ? ? ? ? ? ? ? ? ? ? ? ? ? ? ?Osmara Drummonds G Makhi Muzquiz, LCSW ? ? ? ? ? ? ? ? ? ? ? ? ? ? ?Shayanne Gomm G Omari Mcmanaway, LCSW ? ? ? ? ? ? ? ? ? ? ? ? ? ? ?Lashane Whelpley G Dontray Haberland, LCSW ?

## 2022-03-17 ENCOUNTER — Ambulatory Visit
Admission: EM | Admit: 2022-03-17 | Discharge: 2022-03-17 | Disposition: A | Discharge status: Home / Self Care | Payer: BC Managed Care – PPO

## 2022-03-17 DIAGNOSIS — J3089 Other allergic rhinitis: Secondary | ICD-10-CM | POA: Diagnosis not present

## 2022-03-17 DIAGNOSIS — R051 Acute cough: Secondary | ICD-10-CM | POA: Diagnosis not present

## 2022-03-17 DIAGNOSIS — J329 Chronic sinusitis, unspecified: Secondary | ICD-10-CM

## 2022-03-17 DIAGNOSIS — J310 Chronic rhinitis: Secondary | ICD-10-CM

## 2022-03-17 DIAGNOSIS — R52 Pain, unspecified: Secondary | ICD-10-CM | POA: Diagnosis not present

## 2022-03-17 DIAGNOSIS — J4521 Mild intermittent asthma with (acute) exacerbation: Secondary | ICD-10-CM

## 2022-03-17 DIAGNOSIS — J302 Other seasonal allergic rhinitis: Secondary | ICD-10-CM

## 2022-03-17 DIAGNOSIS — R5383 Other fatigue: Secondary | ICD-10-CM

## 2022-03-17 MED ORDER — PREDNISONE 20 MG PO TABS
20.0000 mg | ORAL_TABLET | Freq: Once | ORAL | Status: AC
Start: 2022-03-17 — End: 2022-03-17
  Administered 2022-03-17: 20 mg via ORAL

## 2022-03-17 MED ORDER — ALBUTEROL SULFATE HFA 108 (90 BASE) MCG/ACT IN AERS
8.0000 | INHALATION_SPRAY | Freq: Once | RESPIRATORY_TRACT | Status: AC
  Administered 2022-03-17: 8 via RESPIRATORY_TRACT

## 2022-03-17 MED ORDER — FEXOFENADINE HCL 180 MG PO TABS: 180.0000 mg | ORAL_TABLET | Freq: Every day | ORAL | 1 refills | Status: DC

## 2022-03-17 MED ORDER — METHYLPREDNISOLONE 4 MG PO TBPK: ORAL_TABLET | ORAL | 0 refills | Status: DC

## 2022-03-17 MED ORDER — MONTELUKAST SODIUM 10 MG PO TABS: 10.0000 mg | ORAL_TABLET | Freq: Every day | ORAL | 2 refills | Status: DC

## 2022-03-17 MED ORDER — IPRATROPIUM BROMIDE 0.06 % NA SOLN: 2.0000 | Freq: Three times a day (TID) | NASAL | 1 refills | Status: DC

## 2022-03-17 MED ORDER — FLUTICASONE PROPIONATE 50 MCG/ACT NA SUSP: 1.0000 | Freq: Every day | NASAL | 1 refills | Status: AC

## 2022-03-17 NOTE — ED Provider Notes (Signed)
?[place] ? ? ? ?CSN: [id number] ?Arrival date & time: 03/17/22  3151 ?  ? ?HISTORY  ? ?Chief Complaint  ?Patient presents with  ? Fatigue  ? Nasal Congestion  ? Facial Pain  ? ?HPI ?Jonathan [name] is a 61 y.o. male. Pt c/o fatigue, nasal congestion, post nasal drip, chills, body aches, and sinus pressure that began Monday.  Patient reports a history of allergies, did allergy desensitization therapy in his 72s, is wondering if he needs to do it again.  States he is currently taking Claritin which she has taken for many years and is wondering if it is no longer helping him.  Patient states is also tried Flonase in the past but is not currently using this, never felt like it helped much.  Patient states he has seen ENT in the past, has had sinus suction procedures done, wondering if he needs to have this done again.  States last night his sinus pain and pressure went from about a 3 to a 10, states has been coughing and producing a significant amount of mucus but none of it purulent.  Patient denies ear pain.  Patient denies history of allergy.  Patient states that he has noticed that he is getting similar symptoms more frequently, it used to be annual and now it seems like it is almost seasonal.  Patient denies GI upset. ? ?The history is provided by the patient.  ?Past Medical History:  ?Diagnosis Date  ? Allergic rhinitis   ? Allergy   ? Anxiety   ? Depression   ? Diabetes mellitus without complication ([place])   ? type 2  ? Heart murmur   ? per pt told it was functional  ? History of kidney stones   ? History of nephrolithiasis   ? Hyperlipidemia   ? Hypertension   ? Sebaceous cyst   ? ?Patient Active Problem List  ? Diagnosis Date Noted  ? Prediabetes 05/15/2016  ? Essential hypertension 05/15/2016  ? Hyperlipidemia 05/15/2016  ? Dyspepsia and other specified disorders of function of stomach 10/25/2013  ? Change in bowel habits 09/07/2013  ? Medication management 09/07/2013  ? Heartburn symptom  09/07/2013  ? Hyperglycemia 09/04/2013  ? BMI 31.0-31.9,adult 05/07/2013  ? SKIN LESION, UNCERTAIN SIGNIFICANCE 03/31/2010  ? NEPHROLITHIASIS, HX OF 02/10/2008  ? HYPERLIPIDEMIA 02/06/2008  ? ANXIETY 02/06/2008  ? DEPRESSION 02/06/2008  ? ATTENTION DEFICIT DISORDER, ADULT 02/06/2008  ? HYPERTENSION 02/06/2008  ? ALLERGIC RHINITIS 02/06/2008  ? ?Past Surgical History:  ?Procedure Laterality Date  ? COLONOSCOPY  07/2010  ? Deatra Ina  ? CYSTOSCOPY/URETEROSCOPY/HOLMIUM LASER/STENT PLACEMENT Left 01/15/2020  ? Procedure: CYSTOSCOPY/URETEROSCOPY/HOLMIUM LASER/STENT PLACEMENT;  Surgeon: Ceasar Mons, MD;  Location: [facility];  Service: Urology;  Laterality: Left;  ? kidney stones removed  cystoscopy  ? x 2  ? TONSILLECTOMY    ? x 2  ? TYMPANOSTOMY TUBE PLACEMENT  as child  ? ? ?Home Medications   ? ?Prior to Admission medications   ?Medication Sig Start Date End Date Taking? Authorizing Provider  ?metFORMIN (GLUCOPHAGE-XR) 500 MG 24 hr tablet Take 1 tablet by mouth 2 (two) times daily. 11/02/20  Yes [provider]  ?loratadine (CLARITIN) 10 MG tablet Take 10 mg by mouth every other day.    [provider]  ?losartan-hydrochlorothiazide (HYZAAR) 50-12.5 MG tablet Take 2 tablets by mouth daily. 08/18/21   Panosh, Standley Brooking, MD  ?metFORMIN (GLUCOPHAGE-XR) 500 MG 24 hr tablet Take 1 tablet (500 mg total)  by mouth 2 (two) times daily. 08/18/21   Panosh, Standley Brooking, MD  ?modafinil (PROVIGIL) 200 MG tablet Take 1/2 once to twice daily 01/19/22   Thayer Headings, [place]  ?Probiotic Product (PROBIOTIC PO) Take 1 tablet by mouth every other day.    [provider]  ? ?Family History ?Family History  ?Problem Relation Age of Onset  ? Diabetes Father   ? Hypertension Father   ? Kidney cancer Father   ?     now on dialysis  ? Kidney Stones Father   ? Kidney Stones Brother   ? Huntington's disease Maternal Grandfather   ? Depression Daughter   ? Anxiety disorder Daughter   ? ADD / ADHD  Daughter   ? Colon polyps Daughter 31  ? Colon cancer Neg Hx   ? Esophageal cancer Neg Hx   ? Stomach cancer Neg Hx   ? Rectal cancer Neg Hx   ? ?Social History ?Social History  ? ?Tobacco Use  ? Smoking status: Never  ? Smokeless tobacco: Never  ?Vaping Use  ? Vaping Use: Never used  ?Substance Use Topics  ? Alcohol use: Yes  ?  Comment: less than 1 a week  ? Drug use: No  ? ?Allergies   ?Statins, Zocor [simvastatin], and Penicillins ? ?Review of Systems ?Review of Systems ?Pertinent findings noted in history of present illness.  ? ?Physical Exam ?Triage Vital Signs ?ED Triage Vitals  ?Enc Vitals Group  ?   BP 10/01/21 0827 (!) 147/82  ?   Pulse Rate 10/01/21 0827 72  ?   Resp 10/01/21 0827 18  ?   Temp 10/01/21 0827 98.3 ?F (36.8 ?C)  ?   Temp Source 10/01/21 0827 Oral  ?   SpO2 10/01/21 0827 98 %  ?   Weight --   ?   Height --   ?   Head Circumference --   ?   Peak Flow --   ?   Pain Score 10/01/21 0826 5  ?   Pain Loc --   ?   Pain Edu? --   ?   Excl. in [place]? --   ?No data found. ? ?Updated Vital Signs ?BP 123/89 (BP Location: Right Arm)   Pulse 100   Temp 98.2 ?F (36.8 ?C) (Oral)   Resp 20   SpO2 94%  ? ?Physical Exam ?Vitals and nursing note reviewed.  ?Constitutional:   ?   General: He is not in acute distress. ?   Appearance: Normal appearance. He is not ill-appearing.  ?HENT:  ?   Head: Normocephalic and atraumatic.  ?   Salivary Glands: Right salivary gland is not diffusely enlarged or tender. Left salivary gland is not diffusely enlarged or tender.  ?   Right Ear: Ear canal and external ear normal. No drainage. A middle ear effusion is present. There is no impacted cerumen. Tympanic membrane is bulging. Tympanic membrane is not injected or erythematous.  ?   Left Ear: Ear canal and external ear normal. No drainage. A middle ear effusion is present. There is no impacted cerumen. Tympanic membrane is bulging. Tympanic membrane is not injected or erythematous.  ?   Ears:  ?   Comments: Bilateral EACs  normal, both TMs bulging with clear fluid ?   Nose: Rhinorrhea present. No nasal deformity, septal deviation, signs of injury, nasal tenderness, mucosal edema or congestion. Rhinorrhea is clear.  ?   Right Nostril: Occlusion present. No foreign body, epistaxis or septal hematoma.  ?  Left Nostril: Occlusion present. No foreign body, epistaxis or septal hematoma.  ?   Right Turbinates: Enlarged, swollen and pale.  ?   Left Turbinates: Enlarged, swollen and pale.  ?   Right Sinus: No maxillary sinus tenderness or frontal sinus tenderness.  ?   Left Sinus: No maxillary sinus tenderness or frontal sinus tenderness.  ?   Mouth/Throat:  ?   Lips: Pink. No lesions.  ?   Mouth: Mucous membranes are moist. No oral lesions.  ?   Pharynx: Oropharynx is clear. Uvula midline. No posterior oropharyngeal erythema or uvula swelling.  ?   Tonsils: No tonsillar exudate. 0 on the right. 0 on the left.  ?   Comments: Postnasal drip ?Eyes:  ?   General: Lids are normal.     ?   Right eye: No discharge.     ?   Left eye: No discharge.  ?   Extraocular Movements: Extraocular movements intact.  ?   Conjunctiva/sclera: Conjunctivae normal.  ?   Right eye: Right conjunctiva is not injected.  ?   Left eye: Left conjunctiva is not injected.  ?Neck:  ?   Trachea: Trachea and phonation normal.  ?Cardiovascular:  ?   Rate and Rhythm: Normal rate and regular rhythm.  ?   Pulses: Normal pulses.  ?   Heart sounds: Normal heart sounds. No murmur heard. ?  No friction rub. No gallop.  ?Pulmonary:  ?   Effort: Pulmonary effort is normal. No accessory muscle usage, prolonged expiration or respiratory distress.  ?   Breath sounds: No stridor, decreased air movement or transmitted upper airway sounds. Examination of the right-upper field reveals decreased breath sounds and rales. Examination of the left-upper field reveals decreased breath sounds. Examination of the right-middle field reveals decreased breath sounds, wheezing and rales. Examination of  the left-middle field reveals decreased breath sounds. Examination of the right-lower field reveals decreased breath sounds. Examination of the left-lower field reveals decreased breath sounds. Decreased breath sounds, wh

## 2022-03-17 NOTE — Discharge Instructions (Addendum)
Your symptoms and my physical exam findings are concerning for exacerbation of your underlying allergies that is triggered inflammation not only in your sinuses but also in your lungs creating increased work of breathing, fatigue, cough and overall just feeling terrible.  It is important that you begin your new allergy regimen as soon as possible and are consistent with taking allergy medications exactly as prescribed.  Allergy medications are preventative and therefore only work well when they are taken daily, not "as needed". ? ?Please see the list below for recommended medications, dosages and frequencies to provide relief of current symptoms:   ?  ?Methylprednisolone (Medrol Dosepak): This is a steroid that will significantly calm the inflammation in your upper and lower airways, please take one row of tablets daily with your breakfast meal starting tomorrow morning until the prescription is complete.  We provided you with a single dose today. ?  ?Allegra (fexofenadine): This is an excellent second-generation antihistamine that helps to reduce respiratory inflammatory response to environmental allergens.  This medication is not known to cause daytime sleepiness so it can be taken in the daytime.  If you find that it does make you sleepy, please feel free to take it at bedtime.  Please continue this medication for the next 3 months. ?  ?Singulair (montelukast): This is a mast cell stabilizer that works well with antihistamines.  Mast cells are responsible for stimulating histamine production so you can imagine that if we can reduce the activity of your mast cells, then fewer histamines will be produced and inflammation caused by allergy exposure will be significantly reduced.  I recommend that you take this medication at the same time you take your antihistamine.  Please continue this medication for the next 30 days and discuss continuing the medication with your primary care provider ? ?Flonase (fluticasone):  This is a steroid nasal spray that you use once daily, 1 spray in each nare.  This medication does not work well if you decide to use it only used as you feel you need to, it works best used on a daily basis.  After 3 to 5 days of use, you will notice significant reduction of the inflammation and mucus production that is currently being caused by exposure to allergens, whether seasonal or environmental.  The most common side effect of this medication is nosebleeds.  If you experience a nosebleed, please discontinue use for 1 week, then feel free to resume.  I have provided you with a prescription but you can also purchase this medication over-the-counter if your insurance will not cover it.  Please continue this medication for the next 3 months. ?  ?Atrovent (ipratropium): This is an excellent nasal decongestant spray that does not cause rebound congestion, please instill 2 sprays into each nare with each use.  Because nasal steroids can take several days before they begin to provide full benefit, I recommend that you use this spray in addition to the nasal steroid prescribed for you.  Please use it after you have used your nasal steroid and repeat up to 4 times daily as needed.  When you find yourself forgetting to use this nasal spray more often than you remember, you will know that you no longer need it.  I have provided you with a prescription for this medication.    ?  ?ProAir, Ventolin, Proventil (albuterol): This inhaled medication contains a short acting beta agonist bronchodilator.  This medication works on the smooth muscle that opens and constricts of your airways  by relaxing the muscle.  The result of relaxation of the smooth muscle is increased air movement and improved work of breathing.  This is a short acting medication that can be used every 4-6 hours as needed for increased work of breathing, shortness of breath, wheezing and excessive coughing.  As with the Atrovent, when you find that you are  forgetting to use this inhaler more often than you remember, you will know that you no longer need to use it.  I have provided you with a prescription.  ?  ?Not being persistent and consistent with taking your allergy medications as prescribed can increase your risk of more frequent upper respiratory infections, lower respiratory disorders, skin reactions, and eye irritations that may or may not require the use of antibiotics and steroids and can result in loss of time at work, celebrations with family and friends as well as missed social opportunities. ?  ?You were tested for both COVID-19 and influenza today, the result of your viral testing will be posted to your MyChart once it is complete, this typically takes 24 to 48 hours.  If there is a positive result, you will be contacted by phone with further recommendations, if any.  If your COVID-19 test is positive, you would benefit from antiviral treatment with Paxlovid. ? ?If you find that you have not had significant relief of your symptoms in the next 7 to 10 days, please follow-up with your primary care provider or return here to urgent care for repeat evaluation and further recommendations. ?  ?Thank you for visiting urgent care today.  We appreciate the opportunity to participate in your care.  It was a pleasure to meet you today. ? ?

## 2022-03-17 NOTE — ED Triage Notes (Signed)
Pt c/o fatigue, nasal congestion, post nasal drip, chills, body aches, and sinus pressure that began Monday. ? ?

## 2022-03-20 LAB — COVID-19, FLU A+B NAA
Influenza A, NAA: NOT DETECTED
Influenza B, NAA: NOT DETECTED
SARS-CoV-2, NAA: NOT DETECTED

## 2022-03-21 ENCOUNTER — Ambulatory Visit
Admission: EM | Admit: 2022-03-21 | Discharge: 2022-03-21 | Disposition: A | Discharge status: Home / Self Care | Payer: BC Managed Care – PPO | Attending: Emergency Medicine | Admitting: Emergency Medicine

## 2022-03-21 DIAGNOSIS — J0101 Acute recurrent maxillary sinusitis: Secondary | ICD-10-CM

## 2022-03-21 DIAGNOSIS — B999 Unspecified infectious disease: Secondary | ICD-10-CM

## 2022-03-21 DIAGNOSIS — J36 Peritonsillar abscess: Secondary | ICD-10-CM

## 2022-03-21 DIAGNOSIS — H66014 Acute suppurative otitis media with spontaneous rupture of ear drum, recurrent, right ear: Secondary | ICD-10-CM

## 2022-03-21 DIAGNOSIS — R0902 Hypoxemia: Secondary | ICD-10-CM

## 2022-03-21 DIAGNOSIS — R053 Chronic cough: Secondary | ICD-10-CM

## 2022-03-21 DIAGNOSIS — R000 Tachycardia, unspecified: Secondary | ICD-10-CM | POA: Diagnosis not present

## 2022-03-21 DIAGNOSIS — J302 Other seasonal allergic rhinitis: Secondary | ICD-10-CM

## 2022-03-21 MED ORDER — CIPROFLOXACIN-DEXAMETHASONE 0.3-0.1 % OT SUSP: 4.0000 [drp] | Freq: Two times a day (BID) | OTIC | 0 refills | Status: DC

## 2022-03-21 MED ORDER — CEFDINIR 300 MG PO CAPS: 300.0000 mg | ORAL_CAPSULE | Freq: Two times a day (BID) | ORAL | 0 refills | Status: AC

## 2022-03-21 MED ORDER — GUAIFENESIN 400 MG PO TABS: ORAL_TABLET | ORAL | 0 refills | Status: DC

## 2022-03-21 MED ORDER — IBUPROFEN 800 MG PO TABS
800.0000 mg | ORAL_TABLET | Freq: Once | ORAL | Status: AC
  Administered 2022-03-21: 800 mg via ORAL

## 2022-03-21 MED ORDER — TRELEGY ELLIPTA 200-62.5-25 MCG/ACT IN AEPB: 1.0000 | INHALATION_SPRAY | Freq: Every morning | RESPIRATORY_TRACT | 0 refills | Status: DC

## 2022-03-21 MED ORDER — AZITHROMYCIN 250 MG PO TABS: ORAL_TABLET | ORAL | 0 refills | Status: AC

## 2022-03-21 NOTE — Discharge Instructions (Addendum)
At this time, I believe that you have a significant bacterial infection that spans from your right maxillary sinus to your right internal and external ear as well as around your right tonsil.  I believe that we need to treat this aggressively because I am concerned that you also may be developing pneumonia as well.   ? ?To treat your sinusitis and brewing pneumonia, please begin cefdinir 1 capsule twice daily for 10 days.  This is a third-generation cephalosporin and clinically has been shown to have less than a 5% cross-reactivity with penicillin type antibiotics.   ? ?For patients who have never had any type of respiratory reaction to penicillin, such as you, cefdinir is considered safe and recommended over other alternative antibiotics to treat your infections pneumonia which are considered less effective.  That being said, please monitor closely for any signs of rash, redness of skin, worsening work of breathing while taking cefdinir and advised this is soon as possible if any of these occur. ? ?Clinical guidelines also recommend dual antibiotic therapy for treating pneumonia, particularly in diabetics and people who suffer from acute allergies.  I have therefore added a second prescription for an antibiotic called azithromycin that is to be taken 2 tablets a day with 1 tablet taken daily thereafter for a full 5-day course. ? ?For your ruptured eardrum on the right, please use Ciprodex eardrops, 4 drops into the right ear twice daily for the next 7 days. ? ?Please discontinue methylprednisolone at this time.  I believe it is no longer necessary.  If you would like to continue taking ibuprofen 400 mg 3 times daily just to help your sinuses open and drain, this is a safe dose to take for several days and will provide you with good inflammatory benefit. ? ?I have also sent you a prescription for a cough medication called guaifenesin which is actually available over-the-counter but difficult to find off the shelf.   It is called guaifenesin and it is an expectorant which means it makes your secretions thinner and easier to move when you cough and blow your nose.  Please take this 3 times daily as well.   ? ?I have added a maintenance inhaler to your breathing regimen called Trelegy.  This inhaler provides a long-acting albuterol as well as an inhaled corticosteroid and a third ingredient that is an anticholinergic bronchodilator.  I find it highly effective in patients who suffer from asthma but also in patients who suffer from chronic bronchitis as well as pneumonia.   ? ?I do not anticipate that you will need to continue this inhaler any further than this illness but do recommend that you use it for the full 28 days with which I provided you today but if you find that this medication improves your work of breathing overall, please discuss continuing this medication with your primary care provider. ? ?Please continue your 2 nasal sprays, fexofenadine allergy tablet and Singulair.  They will all continue to play a role in helping you get over this acute infection.  Please also continue using albuterol as you have been, every little bit is going to continue to help.   ? ?Please feel free to return tomorrow or the next day for reevaluation if you are not making significant progress with this regimen.  I appreciate your patience today and I am honored that you decided to return to see me, I know you had many choices. ? ? ? ? ? ?

## 2022-03-21 NOTE — ED Triage Notes (Signed)
Pt c/o fatigue, post nasal drip, productive cough and sinus pressure that began last week. He states this morning the facial pressure has gotten worse and he now has pain to his right ear.  ?

## 2022-03-21 NOTE — ED Provider Notes (Signed)
?[facility] ? ? ? ?CSN: [id number] ?Arrival date & time: 03/21/22  0818 ?  ? ?HISTORY  ? ?Chief Complaint  ?Patient presents with  ? Fatigue  ? Facial Pain  ? Cough  ? Otalgia  ? ?HPI ?Jonathan [name] is a 61 y.o. male. Pt was seen 4 days ago for fatigue, nasal congestion, post nasal drip, chills, body aches, and sinus pressure that began 3 days prior to his visit.  Patient reported a history of frequent ear infections and sinus infections as a child, was diagnosed with perennial allergic rhinitis and started on aggressive allergy regimen and given a course of steroids. ? ?Patient presents to urgent care again today c/o fatigue, post nasal drip, productive cough and sinus pressure that began last week. He states this morning the facial pressure has gotten worse on the right side and he now has pain in his right ear along with drainage from his right ear that looks like "snot".  On arrival, patient has an oxygen level between 91 and 93%, slightly elevated temperature, he is mildly tachycardic and his blood pressure is elevated. ? ?The history is provided by the patient.  ?Past Medical History:  ?Diagnosis Date  ? Allergic rhinitis   ? Allergy   ? Anxiety   ? Depression   ? Diabetes mellitus without complication ([place])   ? type 2  ? Heart murmur   ? per pt told it was functional  ? History of kidney stones   ? History of nephrolithiasis   ? Hyperlipidemia   ? Hypertension   ? Sebaceous cyst   ? ?Patient Active Problem List  ? Diagnosis Date Noted  ? Prediabetes 05/15/2016  ? Essential hypertension 05/15/2016  ? Hyperlipidemia 05/15/2016  ? Dyspepsia and other specified disorders of function of stomach 10/25/2013  ? Change in bowel habits 09/07/2013  ? Medication management 09/07/2013  ? Heartburn symptom 09/07/2013  ? Hyperglycemia 09/04/2013  ? BMI 31.0-31.9,adult 05/07/2013  ? SKIN LESION, UNCERTAIN SIGNIFICANCE 03/31/2010  ? NEPHROLITHIASIS, HX OF 02/10/2008  ? HYPERLIPIDEMIA 02/06/2008  ? ANXIETY  02/06/2008  ? DEPRESSION 02/06/2008  ? ATTENTION DEFICIT DISORDER, ADULT 02/06/2008  ? HYPERTENSION 02/06/2008  ? ALLERGIC RHINITIS 02/06/2008  ? ?Past Surgical History:  ?Procedure Laterality Date  ? COLONOSCOPY  07/2010  ? Deatra Ina  ? CYSTOSCOPY/URETEROSCOPY/HOLMIUM LASER/STENT PLACEMENT Left 01/15/2020  ? Procedure: CYSTOSCOPY/URETEROSCOPY/HOLMIUM LASER/STENT PLACEMENT;  Surgeon: Ceasar Mons, MD;  Location: [facility];  Service: Urology;  Laterality: Left;  ? kidney stones removed  cystoscopy  ? x 2  ? TONSILLECTOMY    ? x 2  ? TYMPANOSTOMY TUBE PLACEMENT  as child  ? ? ?Home Medications   ? ?Prior to Admission medications   ?Medication Sig Start Date End Date Taking? Authorizing Provider  ?fexofenadine (ALLEGRA) 180 MG tablet Take 1 tablet (180 mg total) by mouth daily. 03/17/22 09/13/22  Lynden Oxford Scales, PA-C  ?fluticasone (FLONASE) 50 MCG/ACT nasal spray Place 1 spray into both nostrils daily. Begin by using 2 sprays in each nare daily for 3 to 5 days, then decrease to 1 spray in each nare daily. 03/17/22   Lynden Oxford Scales, PA-C  ?ipratropium (ATROVENT) 0.06 % nasal spray Place 2 sprays into both nostrils 3 (three) times daily. As needed for nasal congestion, runny nose 03/17/22   Lynden Oxford Scales, PA-C  ?losartan-hydrochlorothiazide (HYZAAR) 50-12.5 MG tablet Take 2 tablets by mouth daily. 08/18/21   Panosh, Standley Brooking, MD  ?metFORMIN (GLUCOPHAGE-XR) 500 MG 24  hr tablet Take 1 tablet (500 mg total) by mouth 2 (two) times daily. 08/18/21   Panosh, Standley Brooking, MD  ?metFORMIN (GLUCOPHAGE-XR) 500 MG 24 hr tablet Take 1 tablet by mouth 2 (two) times daily. 11/02/20   [provider]  ?methylPREDNISolone (MEDROL DOSEPAK) 4 MG TBPK tablet Take 24 mg on day 1, 20 mg on day 2, 16 mg on day 3, 12 mg on day 4, 8 mg on day 5, 4 mg on day 6. 03/17/22   Lynden Oxford Scales, PA-C  ?modafinil (PROVIGIL) 200 MG tablet Take 1/2 once to twice daily 01/19/22   Thayer Headings, [place]   ?montelukast (SINGULAIR) 10 MG tablet Take 1 tablet (10 mg total) by mouth at bedtime. 03/17/22 06/15/22  Lynden Oxford Scales, PA-C  ?Probiotic Product (PROBIOTIC PO) Take 1 tablet by mouth every other day.    [provider]  ? ?Family History ?Family History  ?Problem Relation Age of Onset  ? Diabetes Father   ? Hypertension Father   ? Kidney cancer Father   ?     now on dialysis  ? Kidney Stones Father   ? Kidney Stones Brother   ? Huntington's disease Maternal Grandfather   ? Depression Daughter   ? Anxiety disorder Daughter   ? ADD / ADHD Daughter   ? Colon polyps Daughter 48  ? Colon cancer Neg Hx   ? Esophageal cancer Neg Hx   ? Stomach cancer Neg Hx   ? Rectal cancer Neg Hx   ? ?Social History ?Social History  ? ?Tobacco Use  ? Smoking status: Never  ? Smokeless tobacco: Never  ?Vaping Use  ? Vaping Use: Never used  ?Substance Use Topics  ? Alcohol use: Yes  ?  Comment: less than 1 a week  ? Drug use: No  ? ?Allergies   ?Statins, Zocor [simvastatin], and Penicillins ? ?Review of Systems ?Review of Systems ?Pertinent findings noted in history of present illness.  ? ?Physical Exam ?Triage Vital Signs ?ED Triage Vitals  ?Enc Vitals Group  ?   BP 10/01/21 0827 (!) 147/82  ?   Pulse Rate 10/01/21 0827 72  ?   Resp 10/01/21 0827 18  ?   Temp 10/01/21 0827 98.3 ?F (36.8 ?C)  ?   Temp Source 10/01/21 0827 Oral  ?   SpO2 10/01/21 0827 98 %  ?   Weight --   ?   Height --   ?   Head Circumference --   ?   Peak Flow --   ?   Pain Score 10/01/21 0826 5  ?   Pain Loc --   ?   Pain Edu? --   ?   Excl. in [place]? --   ?No data found. ? ?Updated Vital Signs ?BP (!) 159/82 (BP Location: Right Arm)   Pulse (!) 101   Temp 99.2 ?F (37.3 ?C) (Oral)   Resp 18   SpO2 92% Comment: patient denies SOB, provider aware ? ?Physical Exam ?Vitals and nursing note reviewed.  ?Constitutional:   ?   General: He is not in acute distress. ?   Appearance: Normal appearance. He is not ill-appearing.  ?HENT:  ?   Head: Normocephalic and  atraumatic.  ?   Salivary Glands: Right salivary gland is not diffusely enlarged or tender. Left salivary gland is not diffusely enlarged or tender.  ?   Right Ear: Ear canal and external ear normal. Decreased hearing noted. No drainage. A middle ear effusion (Purulent)  is present. There is no impacted cerumen. Tympanic membrane is perforated. Tympanic membrane is not injected.  ?   Left Ear: Hearing, ear canal and external ear normal. No drainage.  No middle ear effusion. There is no impacted cerumen. Tympanic membrane is bulging. Tympanic membrane is not injected or erythematous.  ?   Ears:  ?   Comments: Bilateral EACs with mild erythema, left TM is normal in appearance ?   Nose: Rhinorrhea present. No nasal deformity, septal deviation, signs of injury, nasal tenderness, mucosal edema or congestion. Rhinorrhea is clear.  ?   Right Nostril: Occlusion present. No foreign body, epistaxis or septal hematoma.  ?   Left Nostril: Occlusion present. No foreign body, epistaxis or septal hematoma.  ?   Right Turbinates: Pale. Not enlarged or swollen.  ?   Left Turbinates: Pale. Not enlarged or swollen.  ?   Right Sinus: Maxillary sinus tenderness present. No frontal sinus tenderness.  ?   Left Sinus: No maxillary sinus tenderness or frontal sinus tenderness.  ?   Mouth/Throat:  ?   Lips: Pink. No lesions.  ?   Mouth: Mucous membranes are moist. No oral lesions or angioedema.  ?   Dentition: Normal dentition. No gingival swelling or dental abscesses.  ?   Tongue: No lesions.  ?   Palate: No mass.  ?   Pharynx: Uvula midline. Pharyngeal swelling (Right peritonsillar) present. No posterior oropharyngeal erythema or uvula swelling.  ?   Tonsils: No tonsillar exudate. 1+ on the right. 1+ on the left.  ?   Comments: Postnasal drip ?Eyes:  ?   General: Lids are normal.     ?   Right eye: No discharge.     ?   Left eye: No discharge.  ?   Extraocular Movements: Extraocular movements intact.  ?   Conjunctiva/sclera: Conjunctivae  normal.  ?   Right eye: Right conjunctiva is not injected.  ?   Left eye: Left conjunctiva is not injected.  ?Neck:  ?   Trachea: Trachea and phonation normal.  ?Cardiovascular:  ?   Rate and Rhythm: Normal ra

## 2022-03-23 ENCOUNTER — Encounter: Payer: Self-pay | Admitting: Internal Medicine

## 2022-03-23 ENCOUNTER — Other Ambulatory Visit: Payer: Self-pay | Admitting: Internal Medicine

## 2022-03-23 ENCOUNTER — Telehealth (INDEPENDENT_AMBULATORY_CARE_PROVIDER_SITE_OTHER): Payer: BC Managed Care – PPO | Admitting: Internal Medicine

## 2022-03-23 VITALS — BP 145/84 | HR 96 | Wt 204.0 lb

## 2022-03-23 DIAGNOSIS — Z79899 Other long term (current) drug therapy: Secondary | ICD-10-CM

## 2022-03-23 DIAGNOSIS — E119 Type 2 diabetes mellitus without complications: Secondary | ICD-10-CM

## 2022-03-23 DIAGNOSIS — J0191 Acute recurrent sinusitis, unspecified: Secondary | ICD-10-CM | POA: Diagnosis not present

## 2022-03-23 DIAGNOSIS — H6691 Otitis media, unspecified, right ear: Secondary | ICD-10-CM | POA: Diagnosis not present

## 2022-03-23 DIAGNOSIS — E785 Hyperlipidemia, unspecified: Secondary | ICD-10-CM

## 2022-03-23 DIAGNOSIS — H7291 Unspecified perforation of tympanic membrane, right ear: Secondary | ICD-10-CM

## 2022-03-23 DIAGNOSIS — R911 Solitary pulmonary nodule: Secondary | ICD-10-CM

## 2022-03-23 DIAGNOSIS — Z125 Encounter for screening for malignant neoplasm of prostate: Secondary | ICD-10-CM

## 2022-03-23 DIAGNOSIS — J329 Chronic sinusitis, unspecified: Secondary | ICD-10-CM

## 2022-03-23 DIAGNOSIS — I10 Essential (primary) hypertension: Secondary | ICD-10-CM

## 2022-03-23 NOTE — Patient Instructions (Signed)
Make lab appointment ?Make face-to-face visit to review findings ?We will be contacted about ENT referral ?Plan CT scan lung for pulmonary nodule August 2023 ?Other issues can addressed at visit. ?

## 2022-03-23 NOTE — Progress Notes (Signed)
?Virtual Visit via Video Note ? ?I connected with Jonathan [name] on 03/23/22 at 12:30 PM EDT by a video enabled telemedicine application and verified that I am speaking with the correct person using two identifiers. ?Location patient: home ?Location provider:work  office ?Persons participating in the virtual visit: patient, provider ? ?[place] national recommendations  regarding COVID 19 pandemic   video visit is advised over in office visit for this patient.  ?Patient aware  of the limitations of evaluation and management by telemedicine and  availability of in person appointments. and agreed to proceed. ? ? ?HPI: ?Jonathan [name] presents for video visit last seen by me June 2022. ?He is following up from ED urgent care visits recently ?"Worse sinusitis event than ever"   slightly better variant of awful today .  Is now on Omnicef and was given Ciprodex drops for his ear the drainage is down his hearing is slightly better ?See urgent care or ED visits times 01/09/2012 and 417. ? ? ?Pulse ox was lower thtn comfort first and second visit   pred and  antibiotic   and  then had  rupture or tm .  No significant cough but inhalers had helped was given trilogy powdered inhaler samples and caused him to cough more.  At this point no significant shortness of breath.  Sinuses slowly improving. ?Wonders if he has underlying allergies and needs to be evaluated he is very concerned about the severity of this particular illness. ? ?History of bilateral PE tubes as a child with some moderate hearing loss. ? ?In other areas renal stones are quiescent ?No recent lab test does not know what his blood sugars doing recently was given prednisone short-term.  He is due to come in. ? ? ?ROS: See pertinent positives and negatives per HPI. ? ?Past Medical History:  ?Diagnosis Date  ? Allergic rhinitis   ? Allergy   ? Anxiety   ? Depression   ? Diabetes mellitus without complication ([place])   ? type 2  ? Heart murmur   ? per pt told it was  functional  ? History of kidney stones   ? History of nephrolithiasis   ? Hyperlipidemia   ? Hypertension   ? Sebaceous cyst   ? ? ?Past Surgical History:  ?Procedure Laterality Date  ? COLONOSCOPY  07/2010  ? Deatra Ina  ? CYSTOSCOPY/URETEROSCOPY/HOLMIUM LASER/STENT PLACEMENT Left 01/15/2020  ? Procedure: CYSTOSCOPY/URETEROSCOPY/HOLMIUM LASER/STENT PLACEMENT;  Surgeon: Ceasar Mons, MD;  Location: [facility];  Service: Urology;  Laterality: Left;  ? kidney stones removed  cystoscopy  ? x 2  ? TONSILLECTOMY    ? x 2  ? TYMPANOSTOMY TUBE PLACEMENT  as child  ? ? ?Family History  ?Problem Relation Age of Onset  ? Diabetes Father   ? Hypertension Father   ? Kidney cancer Father   ?     now on dialysis  ? Kidney Stones Father   ? Kidney Stones Brother   ? Huntington's disease Maternal Grandfather   ? Depression Daughter   ? Anxiety disorder Daughter   ? ADD / ADHD Daughter   ? Colon polyps Daughter 67  ? Colon cancer Neg Hx   ? Esophageal cancer Neg Hx   ? Stomach cancer Neg Hx   ? Rectal cancer Neg Hx   ? ? ?Social History  ? ?Tobacco Use  ? Smoking status: Never  ? Smokeless tobacco: Never  ?Vaping Use  ? Vaping Use: Never used  ?Substance  Use Topics  ? Alcohol use: Yes  ?  Comment: less than 1 a week  ? Drug use: No  ? ? ? ? ?Current Outpatient Medications:  ?  azithromycin (ZITHROMAX) 250 MG tablet, Take 2 tablets (500 mg total) by mouth daily for 1 day, THEN 1 tablet (250 mg total) daily for 4 days., Disp: 6 tablet, Rfl: 0 ?  cefdinir (OMNICEF) 300 MG capsule, Take 1 capsule (300 mg total) by mouth 2 (two) times daily for 10 days., Disp: 20 capsule, Rfl: 0 ?  ciprofloxacin-dexamethasone (CIPRODEX) OTIC suspension, Place 4 drops into the right ear 2 (two) times daily., Disp: 7.5 mL, Rfl: 0 ?  fexofenadine (ALLEGRA) 180 MG tablet, Take 1 tablet (180 mg total) by mouth daily., Disp: 90 tablet, Rfl: 1 ?  fluticasone (FLONASE) 50 MCG/ACT nasal spray, Place 1 spray into both nostrils daily. Begin  by using 2 sprays in each nare daily for 3 to 5 days, then decrease to 1 spray in each nare daily., Disp: 32 mL, Rfl: 1 ?  Fluticasone-Umeclidin-Vilant (TRELEGY ELLIPTA) 200-62.5-25 MCG/ACT AEPB, Inhale 1 puff into the lungs in the morning for 28 days., Disp: 2 each, Rfl: 0 ?  guaifenesin (HUMIBID E) 400 MG TABS tablet, Take 1 tablet 3 times daily as needed for chest congestion and cough, Disp: 21 tablet, Rfl: 0 ?  ipratropium (ATROVENT) 0.06 % nasal spray, Place 2 sprays into both nostrils 3 (three) times daily. As needed for nasal congestion, runny nose, Disp: 15 mL, Rfl: 1 ?  losartan-hydrochlorothiazide (HYZAAR) 50-12.5 MG tablet, Take 2 tablets by mouth daily., Disp: 180 tablet, Rfl: 1 ?  metFORMIN (GLUCOPHAGE-XR) 500 MG 24 hr tablet, Take 1 tablet (500 mg total) by mouth 2 (two) times daily., Disp: 180 tablet, Rfl: 1 ?  modafinil (PROVIGIL) 200 MG tablet, Take 1/2 once to twice daily, Disp: 90 tablet, Rfl: 0 ?  montelukast (SINGULAIR) 10 MG tablet, Take 1 tablet (10 mg total) by mouth at bedtime., Disp: 30 tablet, Rfl: 2 ?  Probiotic Product (PROBIOTIC PO), Take 1 tablet by mouth every other day., Disp: , Rfl:  ? ?EXAM: ?BP Readings from Last 3 Encounters:  ?03/23/22 (!) 145/84  ?03/21/22 (!) 159/82  ?03/17/22 123/89  ? ? ?VITALS per patient if applicable: ? ?GENERAL: alert, oriented, appears well and in no acute distress normal speech ? ? ?PSYCH/NEURO: pleasant and cooperative, no obvious depression or anxiety, speech and thought processing grossly intact verbal no cough during interview. ?Lab Results  ?Component Value Date  ? WBC 10.1 10/19/2020  ? HGB 17.2 (H) 10/19/2020  ? HCT 49.2 10/19/2020  ? PLT 250 10/19/2020  ? GLUCOSE 105 (H) 10/19/2020  ? CHOL 258 (H) 10/19/2020  ? TRIG 198 (H) 10/19/2020  ? HDL 52 10/19/2020  ? LDLDIRECT 190.2 12/11/2013  ? [place] 170 (H) 10/19/2020  ? ALT 31 10/19/2020  ? AST 20 10/19/2020  ? NA 141 08/17/2021  ? K 4.3 08/17/2021  ? CL 102 08/17/2021  ? CREATININE 1.1 08/17/2021   ? BUN 16 10/19/2020  ? CO2 30 (A) 08/17/2021  ? TSH 0.82 10/19/2020  ? PSA 0.81 10/19/2020  ? HGBA1C 7.0 (A) 05/05/2021  ? MICROALBUR 0.8 10/19/2020  ? ? ?ASSESSMENT AND PLAN: ? ?Discussed the following assessment and plan: ? ?  ICD-10-CM   ?1. Acute otitis media with perforation, right  H66.91 Ambulatory referral to ENT  ? H72.91   ?  ?2. Acute recurrent sinusitis, unspecified location  J01.91 Ambulatory referral to ENT  ?  ?  3. Diabetes mellitus with coincident hypertension (HCC)  Z02.5 Basic metabolic panel  ? I10 CBC with Differential/Platelet  ?  Hemoglobin A1c  ?  Hepatic function panel  ?  Lipid panel  ?  TSH  ?  PSA  ?  ?4. Medication management  E52.778 Basic metabolic panel  ?  CBC with Differential/Platelet  ?  Hemoglobin A1c  ?  Hepatic function panel  ?  Lipid panel  ?  TSH  ?  PSA  ?  ?5. Hyperlipidemia, unspecified hyperlipidemia type  E42.3 Basic metabolic panel  ?  CBC with Differential/Platelet  ?  Hemoglobin A1c  ?  Hepatic function panel  ?  Lipid panel  ?  TSH  ?  PSA  ?  ?6. Essential hypertension  N36 Basic metabolic panel  ?  CBC with Differential/Platelet  ?  Hemoglobin A1c  ?  Hepatic function panel  ?  Lipid panel  ?  TSH  ?  PSA  ?  ?7. Screening PSA (prostate specific antigen)  R44.3 Basic metabolic panel  ?  CBC with Differential/Platelet  ?  Hemoglobin A1c  ?  Hepatic function panel  ?  Lipid panel  ?  TSH  ?  PSA  ?  ?8. Pulmonary nodule seen on imaging study  X54.0 Basic metabolic panel  ?  CBC with Differential/Platelet  ?  Hemoglobin A1c  ?  Hepatic function panel  ?  Lipid panel  ?  TSH  ?  PSA  ?  CT Chest Wo Contrast  ?  ? ?Record review urgent care ED visits and plan counseling record review regarding other metabolic factors that need follow-up ?Reviewed CT scan done in August 2022 with a 9 mm nodule and recommended follow-up in 6 to 12 months is asymptomatic discussed with patient we will put order in to have CT lung done in August 2023. ? ?Is discussing an ENT evaluation  referral and I agree because of the severity that he describes and still under treatment. ?Underlying allergy evaluation may be in order but ENT evaluation first. ?Make lab appointment and then a follow-

## 2022-03-25 ENCOUNTER — Telehealth: Payer: Self-pay | Admitting: Internal Medicine

## 2022-03-25 NOTE — Telephone Encounter (Signed)
Pt scheduled with ENT per referral, appointment is in august. Pt requesting for provider to see if there is another ENT that can get him in sooner.  ?

## 2022-03-25 NOTE — Telephone Encounter (Signed)
Per patient's request is it ok to refer to another ENT ?Please advise ?

## 2022-03-26 ENCOUNTER — Other Ambulatory Visit: Payer: Self-pay | Admitting: Internal Medicine

## 2022-03-27 NOTE — Telephone Encounter (Signed)
Yes  please get  ent to see him earlier  than August . IF he is getting worse  make office visit in interim.  ?

## 2022-03-28 ENCOUNTER — Ambulatory Visit: Payer: BC Managed Care – PPO | Admitting: Internal Medicine

## 2022-03-28 ENCOUNTER — Encounter: Payer: Self-pay | Admitting: Internal Medicine

## 2022-03-28 ENCOUNTER — Other Ambulatory Visit: Payer: BC Managed Care – PPO

## 2022-03-28 NOTE — Telephone Encounter (Signed)
Message sent to referral coordinator to see if patient can be seen sooner than August ?

## 2022-03-28 NOTE — Telephone Encounter (Signed)
Ok to use  appropriate ear plug ( to not get stuck in ear canal) when risk of water in ear .   ? Ear protection  for excess sound if only reason  to use plug  for a music festival . Otherwise dont put anything in ear

## 2022-03-29 ENCOUNTER — Encounter: Payer: Self-pay | Admitting: Psychiatry

## 2022-03-29 ENCOUNTER — Ambulatory Visit: Payer: BC Managed Care – PPO | Admitting: Psychiatry

## 2022-03-29 DIAGNOSIS — F410 Panic disorder [episodic paroxysmal anxiety] without agoraphobia: Secondary | ICD-10-CM | POA: Diagnosis not present

## 2022-03-29 DIAGNOSIS — F902 Attention-deficit hyperactivity disorder, combined type: Secondary | ICD-10-CM

## 2022-03-29 DIAGNOSIS — F330 Major depressive disorder, recurrent, mild: Secondary | ICD-10-CM

## 2022-03-29 MED ORDER — MODAFINIL 200 MG PO TABS: ORAL_TABLET | ORAL | 2 refills | Status: DC

## 2022-03-29 MED ORDER — VILAZODONE HCL 10 MG PO TABS
ORAL_TABLET | ORAL | 1 refills | Status: DC
Start: 2022-03-29 — End: 2022-04-26

## 2022-03-29 MED ORDER — ALPRAZOLAM 0.5 MG PO TABS: ORAL_TABLET | ORAL | 0 refills | Status: DC

## 2022-03-29 NOTE — Progress Notes (Signed)
[name] ?[id number] ?1961/09/01 ?61 y.o. ? ?Subjective:  ? ?Patient ID:  Jonathan [name] is a 61 y.o. (DOB 02/23/1961) male. ? ?Chief Complaint:  ?Chief Complaint  ?Patient presents with  ? Depression  ? Panic Attack  ? ? ?HPI ?Jonathan [name] presents to the office today for follow-up of ADHD, anxiety, and depression.  ? ?He had a full panic attack one night at 2 am with ear drum perforation and acute hearing loss. He then used steam therapy, tapping exercises, and pacing.  He reports that he had catastrophic thoughts about permanently losing his hearing. He asks about having something available to be used as needed on rare occasions when panic occurs.  ? ?He reports, "the modafinil is doing what I need and the focus I need... I still need something for depression." He reports that he is "in a constant state of defeatism." He reports that he will sigh and curse. He reports that he notices negative thoughts and feeling "everything is against me." Feels that energy and motivation would be low without Modafinil. Denies anxiety on a daily basis. Denies diminished interest in things. Denies social isolation. Looking forward to [place] this weekend. Slept well last night for the first time since having sinus issues. Sleep had been decreased the last couple of weeks with sinus issues. Appetite has also been lower with sinus issues. He reports some passive death wishes during panic. Denies acute SI.  ? ?He reports that he notices improved concentration and energy with Modafinil.  ? ?Mother-in-law died the week before Easter. They had the service in [place], [place] and had to transport daughter from [place], [place] and back.  ? ?Past Psychiatric Medication Trials: ?Citalopram- Sexual side effects, fatigue. Partially effective for depression. Increased to 15 mg in the last month and noticed more fatigue. He reports that he has been taking 10 mg most of the time since then.  ?Trintellix- Improved mood, anxiety, and  focus on 5 mg. Excessive somnolence on 10 mg. ?Pristiq ?Wellbutrin- Took 25 years ago. Cannot recall response. ?Concerta ?Modafinil ? ?PHQ2-9   ? ?[place] Office Visit from 05/05/2021 in [place] at [facility] from 10/19/2020 in [place] at [place] from 08/02/2019 in [place] at [facility] from 10/01/2018 in [place] at [facility] from 05/24/2017 in [place] at [facility]  ?PHQ-2 Total Score 2 0 4 0 0  ?PHQ-9 Total Score '12 9 15 '$ -- --  ? ?  ? ?[place] [facility] from 03/21/2022 in [facility] [place] [facility] from 03/17/2022 in [facility] [place] [facility] from 07/16/2021 in [facility] at [place]  ?C-SSRS RISK CATEGORY No Risk No Risk Error: Question 6 not populated  ? ?  ?  ? ?Review of Systems:  ?Review of Systems  ?HENT:  Positive for congestion and hearing loss.   ?Respiratory:  Positive for cough.   ? ? ? ?Medications: I have reviewed the patient's current medications. ? ?Current Outpatient Medications  ?Medication Sig Dispense Refill  ? ALPRAZolam (XANAX) 0.5 MG tablet Take 1/2-1 tab as needed every 4-6 hours as needed for severe anxiety and panic 30 tablet 0  ? cefdinir (OMNICEF) 300 MG capsule Take 1 capsule (300 mg total) by mouth 2 (two) times daily for 10 days. 20 capsule 0  ? ciprofloxacin-dexamethasone (CIPRODEX) OTIC suspension Place 4 drops into the right ear 2 (two) times daily. 7.5 mL 0  ?  fexofenadine (ALLEGRA) 180 MG tablet Take 1 tablet (180 mg total) by mouth daily. 90 tablet 1  ? fluticasone (FLONASE) 50 MCG/ACT nasal spray Place 1 spray into both nostrils daily. Begin by using 2 sprays in each nare daily for 3 to 5 days, then decrease to 1 spray in each nare daily. 32 mL 1  ? guaifenesin (HUMIBID E) 400 MG TABS tablet Take 1 tablet 3 times daily as needed for chest congestion and cough 21 tablet 0  ? ipratropium (ATROVENT) 0.06 % nasal  spray Place 2 sprays into both nostrils 3 (three) times daily. As needed for nasal congestion, runny nose 15 mL 1  ? losartan-hydrochlorothiazide (HYZAAR) 50-12.5 MG tablet Take 2 tablets by mouth daily. 180 tablet 1  ? metFORMIN (GLUCOPHAGE-XR) 500 MG 24 hr tablet Take 1 tablet (500 mg total) by mouth 2 (two) times daily. 180 tablet 1  ? modafinil (PROVIGIL) 200 MG tablet Take 1/2 tablet twice daily as needed 90 tablet 2  ? montelukast (SINGULAIR) 10 MG tablet Take 1 tablet (10 mg total) by mouth at bedtime. 30 tablet 2  ? Probiotic Product (PROBIOTIC PO) Take 1 tablet by mouth every other day.    ? Vilazodone HCl (VIIBRYD) 10 MG TABS Take 1/2 tablet daily with breakfast for one week, then increase to 1 tablet daily with breakfast 30 tablet 1  ? Fluticasone-Umeclidin-Vilant (TRELEGY ELLIPTA) 200-62.5-25 MCG/ACT AEPB Inhale 1 puff into the lungs in the morning for 28 days. (Patient not taking: Reported on 03/29/2022) 2 each 0  ? ?No current facility-administered medications for this visit.  ? ? ?Medication Side Effects: None ? ?Allergies:  ?Allergies  ?Allergen Reactions  ? Statins Other (See Comments) and Tinitus  ?  ? myalgias ?? myalgias ?  ? Zocor [Simvastatin]   ?  ? myalgias  ? Penicillins Hives  ?  Per pt - happened in childhood - hives possible reaction  ? ? ?Past Medical History:  ?Diagnosis Date  ? Allergic rhinitis   ? Allergy   ? Anxiety   ? Depression   ? Diabetes mellitus without complication ([place])   ? type 2  ? Heart murmur   ? per pt told it was functional  ? History of kidney stones   ? History of nephrolithiasis   ? Hyperlipidemia   ? Hypertension   ? Sebaceous cyst   ? ? ?Past Medical History, Surgical history, Social history, and Family history were reviewed and updated as appropriate.  ? ?Please see review of systems for further details on the patient's review from today.  ? ?Objective:  ? ?Physical Exam:  ?There were no vitals taken for this visit. ? ?Physical Exam ?Constitutional:   ?    General: He is not in acute distress. ?Musculoskeletal:     ?   General: No deformity.  ?Neurological:  ?   Mental Status: He is alert and oriented to person, place, and time.  ?   Coordination: Coordination normal.  ?Psychiatric:     ?   Attention and Perception: Attention and perception normal. He does not perceive auditory or visual hallucinations.     ?   Mood and Affect: Affect is not labile, blunt, angry or inappropriate.     ?   Speech: Speech normal.     ?   Behavior: Behavior normal.     ?   Thought Content: Thought content normal. Thought content is not paranoid or delusional. Thought content does not include homicidal or suicidal ideation. Thought  content does not include homicidal or suicidal plan.     ?   Cognition and Memory: Cognition and memory normal.     ?   Judgment: Judgment normal.  ?   Comments: Insight intact ?Mood is mildly dysphoric and anxious  ? ? ?Lab Review:  ?   ?Component Value Date/Time  ? NA 141 08/17/2021 0000  ? K 4.3 08/17/2021 0000  ? CL 102 08/17/2021 0000  ? CO2 30 (A) 08/17/2021 0000  ? GLUCOSE 105 (H) 10/19/2020 1206  ? BUN 16 10/19/2020 1206  ? CREATININE 1.1 08/17/2021 0000  ? CREATININE 1.15 10/19/2020 1206  ? CALCIUM 9.5 08/17/2021 0000  ? PROT 6.5 10/19/2020 1206  ? ALBUMIN 3.9 01/05/2020 1014  ? AST 20 10/19/2020 1206  ? ALT 31 10/19/2020 1206  ? ALKPHOS 54 01/05/2020 1014  ? BILITOT 0.7 10/19/2020 1206  ? GFRNONAA 72.1 08/17/2021 0000  ? GFRNONAA 68 10/19/2020 1206  ? GFRAA 83.5 08/17/2021 0000  ? GFRAA 79 10/19/2020 1206  ? ? ?   ?Component Value Date/Time  ? WBC 10.1 10/19/2020 1206  ? RBC 5.56 10/19/2020 1206  ? HGB 17.2 (H) 10/19/2020 1206  ? HCT 49.2 10/19/2020 1206  ? PLT 250 10/19/2020 1206  ? MCV 88.5 10/19/2020 1206  ? MCV 94.2 04/29/2013 0936  ? MCH 30.9 10/19/2020 1206  ? MCHC 35.0 10/19/2020 1206  ? RDW 12.5 10/19/2020 1206  ? LYMPHSABS 3,485 10/19/2020 1206  ? MONOABS 0.9 09/05/2018 0754  ? EOSABS 313 10/19/2020 1206  ? BASOSABS 81 10/19/2020 1206  ? ? ?No  results found for: POCLITH, LITHIUM  ? ?No results found for: PHENYTOIN, PHENOBARB, VALPROATE, CBMZ  ? ?.res ?Assessment: Plan:   ?Pt seen for 45 minutes and time spent discussing recent panic attack and poss

## 2022-03-30 ENCOUNTER — Other Ambulatory Visit (INDEPENDENT_AMBULATORY_CARE_PROVIDER_SITE_OTHER): Payer: BC Managed Care – PPO

## 2022-03-30 DIAGNOSIS — E785 Hyperlipidemia, unspecified: Secondary | ICD-10-CM

## 2022-03-30 DIAGNOSIS — E119 Type 2 diabetes mellitus without complications: Secondary | ICD-10-CM

## 2022-03-30 DIAGNOSIS — Z79899 Other long term (current) drug therapy: Secondary | ICD-10-CM

## 2022-03-30 DIAGNOSIS — R911 Solitary pulmonary nodule: Secondary | ICD-10-CM

## 2022-03-30 DIAGNOSIS — I10 Essential (primary) hypertension: Secondary | ICD-10-CM

## 2022-03-30 DIAGNOSIS — Z125 Encounter for screening for malignant neoplasm of prostate: Secondary | ICD-10-CM | POA: Diagnosis not present

## 2022-03-30 LAB — CBC WITH DIFFERENTIAL/PLATELET
Basophils Absolute: 0.1 10*3/uL (ref 0.0–0.1)
Basophils Relative: 1 % (ref 0.0–3.0)
Eosinophils Absolute: 0.2 10*3/uL (ref 0.0–0.7)
Eosinophils Relative: 1.8 % (ref 0.0–5.0)
HCT: 48.4 % (ref 39.0–52.0)
Hemoglobin: 16.3 g/dL (ref 13.0–17.0)
Lymphocytes Relative: 24.3 % (ref 12.0–46.0)
Lymphs Abs: 2.3 10*3/uL (ref 0.7–4.0)
MCHC: 33.7 g/dL (ref 30.0–36.0)
MCV: 92.8 fl (ref 78.0–100.0)
Monocytes Absolute: 0.9 10*3/uL (ref 0.1–1.0)
Monocytes Relative: 9.5 % (ref 3.0–12.0)
Neutro Abs: 6.1 10*3/uL (ref 1.4–7.7)
Neutrophils Relative %: 63.4 % (ref 43.0–77.0)
Platelets: 358 10*3/uL (ref 150.0–400.0)
RBC: 5.22 Mil/uL (ref 4.22–5.81)
RDW: 13.3 % (ref 11.5–15.5)
WBC: 9.6 10*3/uL (ref 4.0–10.5)

## 2022-03-30 LAB — HEPATIC FUNCTION PANEL
ALT: 27 U/L (ref 0–53)
AST: 18 U/L (ref 0–37)
Albumin: 4.1 g/dL (ref 3.5–5.2)
Alkaline Phosphatase: 65 U/L (ref 39–117)
Bilirubin, Direct: 0.1 mg/dL (ref 0.0–0.3)
Total Bilirubin: 0.7 mg/dL (ref 0.2–1.2)
Total Protein: 6.5 g/dL (ref 6.0–8.3)

## 2022-03-30 LAB — PSA: PSA: 1.17 ng/mL (ref 0.10–4.00)

## 2022-03-30 LAB — LIPID PANEL
Cholesterol: 279 mg/dL — ABNORMAL HIGH (ref 0–200)
HDL: 50.1 mg/dL (ref >39.00)
NonHDL: 229.09
Total CHOL/HDL Ratio: 6
Triglycerides: 224 mg/dL — ABNORMAL HIGH (ref 0.0–149.0)
VLDL: 44.8 mg/dL — ABNORMAL HIGH (ref 0.0–40.0)

## 2022-03-30 LAB — BASIC METABOLIC PANEL
BUN: 16 mg/dL (ref 6–23)
CO2: 30 mEq/L (ref 19–32)
Calcium: 9.3 mg/dL (ref 8.4–10.5)
Chloride: 101 mEq/L (ref 96–112)
Creatinine, Ser: 1.22 mg/dL (ref 0.40–1.50)
GFR: 63.49 mL/min (ref >60.00)
Glucose, Bld: 141 mg/dL — ABNORMAL HIGH (ref 70–99)
Potassium: 4.7 mEq/L (ref 3.5–5.1)
Sodium: 141 mEq/L (ref 135–145)

## 2022-03-30 LAB — HEMOGLOBIN A1C: Hgb A1c MFr Bld: 7.6 % — ABNORMAL HIGH (ref 4.6–6.5)

## 2022-03-30 LAB — LDL CHOLESTEROL, DIRECT: Direct LDL: 209 mg/dL

## 2022-03-30 LAB — TSH: TSH: 0.79 u[IU]/mL (ref 0.35–5.50)

## 2022-03-31 ENCOUNTER — Telehealth: Payer: Self-pay

## 2022-03-31 NOTE — Telephone Encounter (Signed)
Prior Authorization submitted and approved for VILAZODONE 10 MG effective 03/01/2022-03/31/2023 with [employer] PA# [id number] ? ? ?Pt ID# [id number] ?

## 2022-03-31 NOTE — Progress Notes (Signed)
Blood sugar is higher than last check  as is cholesterol .  Rest of labs normal .  ?Will address treatment options and interventions  at upcoming visit  which will include medication discussion

## 2022-04-03 NOTE — Progress Notes (Signed)
? ?Chief Complaint  ?Patient presents with  ? Follow-up  ? ? ?HPI: ?SHAHIEM BEDWELL 61 y.o. come in for  labs  fu  noted to have elevated a1 and very high lipids ? ?Still recovering from ear infection and perf had a severe sinus symptoms but is using Flonase and as needed ipratropium has some ongoing congestion and hoarseness has a ENT exam appointment in about a week or so.  Asks if there is other things to do. ?Trelegy caused him to cough right away although albuterol worked when needed does not currently require albuterol but would like to have one on hand in case. ?Is taking metformin for the last few weeks twice a day but in the past would forget the second dose.  Reports no side effects of significance. ?Had a history of severe muscle and joint aches pretty soon after beginning simvastatin in the past has questions about treatment or interventions for the hyperlipidemia. ? ?Under care of  anxiety and add  ?ROS: See pertinent positives and negatives per HPI. ? ?Past Medical History:  ?Diagnosis Date  ? Allergic rhinitis   ? Allergy   ? Anxiety   ? Depression   ? Diabetes mellitus without complication ([place])   ? type 2  ? Heart murmur   ? per pt told it was functional  ? History of kidney stones   ? History of nephrolithiasis   ? Hyperlipidemia   ? Hypertension   ? Sebaceous cyst   ? ? ?Family History  ?Problem Relation Age of Onset  ? Diabetes Father   ? Hypertension Father   ? Kidney cancer Father   ?     now on dialysis  ? Kidney Stones Father   ? Kidney Stones Brother   ? Huntington's disease Maternal Grandfather   ? Depression Daughter   ? Anxiety disorder Daughter   ? ADD / ADHD Daughter   ? Colon polyps Daughter 75  ? Colon cancer Neg Hx   ? Esophageal cancer Neg Hx   ? Stomach cancer Neg Hx   ? Rectal cancer Neg Hx   ? ? ?Social History  ? ?Socioeconomic History  ? Marital status: Married  ?  Spouse name: Not on file  ? Number of children: 1  ? Years of education: Not on file  ? Highest education  level: Not on file  ?Occupational History  ? Occupation: MINISTER  ?  Employer: [employer]  ?Tobacco Use  ? Smoking status: Never  ? Smokeless tobacco: Never  ?Vaping Use  ? Vaping Use: Never used  ?Substance and Sexual Activity  ? Alcohol use: Yes  ?  Comment: less than 1 a week  ? Drug use: No  ? Sexual activity: Yes  ?Other Topics Concern  ? Not on file  ?Social History Narrative  ? Married  ? Regular exercise- no  ? HH of 3  ? Dog car and lizard  ? Sleep wakening at times  ? New job [employer]   Per week reg sleep.   ? ?Social Determinants of Health  ? ?Financial Resource Strain: Not on file  ?Food Insecurity: Not on file  ?Transportation Needs: Not on file  ?Physical Activity: Not on file  ?Stress: Not on file  ?Social Connections: Not on file  ? ? ?Outpatient Medications Prior to Visit  ?Medication Sig Dispense Refill  ? ALPRAZolam (XANAX) 0.5 MG tablet Take 1/2-1 tab as needed every 4-6 hours as needed for severe anxiety and  panic 30 tablet 0  ? fexofenadine (ALLEGRA) 180 MG tablet Take 1 tablet (180 mg total) by mouth daily. 90 tablet 1  ? fluticasone (FLONASE) 50 MCG/ACT nasal spray Place 1 spray into both nostrils daily. Begin by using 2 sprays in each nare daily for 3 to 5 days, then decrease to 1 spray in each nare daily. 32 mL 1  ? guaifenesin (HUMIBID E) 400 MG TABS tablet Take 1 tablet 3 times daily as needed for chest congestion and cough 21 tablet 0  ? ipratropium (ATROVENT) 0.06 % nasal spray Place 2 sprays into both nostrils 3 (three) times daily. As needed for nasal congestion, runny nose 15 mL 1  ? losartan-hydrochlorothiazide (HYZAAR) 50-12.5 MG tablet TAKE 2 TABLETS DAILY 180 tablet 3  ? modafinil (PROVIGIL) 200 MG tablet Take 1/2 tablet twice daily as needed 90 tablet 2  ? montelukast (SINGULAIR) 10 MG tablet Take 1 tablet (10 mg total) by mouth at bedtime. 30 tablet 2  ? Probiotic Product (PROBIOTIC PO) Take 1 tablet by mouth every other day.    ? Vilazodone HCl (VIIBRYD) 10  MG TABS Take 1/2 tablet daily with breakfast for one week, then increase to 1 tablet daily with breakfast 30 tablet 1  ? ciprofloxacin-dexamethasone (CIPRODEX) OTIC suspension Place 4 drops into the right ear 2 (two) times daily. 7.5 mL 0  ? metFORMIN (GLUCOPHAGE-XR) 500 MG 24 hr tablet TAKE 1 TABLET TWICE A DAY 180 tablet 3  ? Fluticasone-Umeclidin-Vilant (TRELEGY ELLIPTA) 200-62.5-25 MCG/ACT AEPB Inhale 1 puff into the lungs in the morning for 28 days. 2 each 0  ? ?No facility-administered medications prior to visit.  ? ? ? ?EXAM: ? ?BP 140/72   Pulse 100   Temp 98.3 ?F (36.8 ?C) (Oral)   Ht '5\' 8"'$  (1.727 m)   Wt 210 lb (95.3 kg)   SpO2 97%   BMI 31.93 kg/m?  ? ?Body mass index is 31.93 kg/m?. ?Wt Readings from Last 3 Encounters:  ?04/04/22 210 lb (95.3 kg)  ?03/23/22 204 lb (92.5 kg)  ?06/02/21 207 lb 12.8 oz (94.3 kg)  ? ? ?GENERAL: vitals reviewed and listed above, alert, oriented, appears well hydrated and in no acute distress mildly raspy voice ?HEENT: atraumatic, conjunctiva  clear, no obvious abnormalities on inspection of external nose and ears right TM 1 small hemorrhagic area possible scarring white area inferior TM ?NECK: no obvious masses on inspection palpation  ?LUNGS: clear to auscultation bilaterally, no wheezes, rales or rhonchi, good air movement ?CV: HRRR, no clubbing cyanosis or  peripheral edema nl cap refill  ?MS: moves all extremities without noticeable focal  abnormality ?PSYCH: pleasant and cooperative,  ?Lab Results  ?Component Value Date  ? WBC 9.6 03/30/2022  ? HGB 16.3 03/30/2022  ? HCT 48.4 03/30/2022  ? PLT 358.0 03/30/2022  ? GLUCOSE 141 (H) 03/30/2022  ? CHOL 279 (H) 03/30/2022  ? TRIG 224.0 (H) 03/30/2022  ? HDL 50.10 03/30/2022  ? LDLDIRECT 209.0 03/30/2022  ? [place] 170 (H) 10/19/2020  ? ALT 27 03/30/2022  ? AST 18 03/30/2022  ? NA 141 03/30/2022  ? K 4.7 03/30/2022  ? CL 101 03/30/2022  ? CREATININE 1.22 03/30/2022  ? BUN 16 03/30/2022  ? CO2 30 03/30/2022  ? TSH 0.79  03/30/2022  ? PSA 1.17 03/30/2022  ? HGBA1C 7.6 (H) 03/30/2022  ? MICROALBUR 0.8 10/19/2020  ? ?BP Readings from Last 3 Encounters:  ?04/04/22 140/72  ?03/23/22 (!) 145/84  ?03/21/22 (!) 159/82  ? ?Lab  reviewed today ?ASSESSMENT AND PLAN: ? ?Discussed the following assessment and plan: ? ?Diabetes mellitus with coincident hypertension ([place]) - a1c  up to 7.4  was unable to adhere to doing metformin until recnetly - Plan: Hemoglobin A1c, Lipid panel ? ?Medication management - Plan: Hemoglobin A1c, Lipid panel ? ?Hyperlipidemia, unspecified hyperlipidemia type - Plan: Hemoglobin A1c, Lipid panel ? ?Sinus congestion - w recent om and perf  ?Discussion counseling. ?Agrees to try increasing the metformin to regular 500 twice daily and eventually 1500 mg a day as a trial as opposed to adding other medication. ?Is willing to try low-dose statin after discussion begin Crestor 5 mg a week x2 then increase to 3 times a week ?Plan follow-up lipid panel and A1c in 3 months then visit virtual or in person ?Realizing that the goal would be LDL below 100 or even to 70.  We will go slow because of his reluctance to start another statin.  Had side effects of simvastatin ? ?In regard to upper respiratory congestion he has coming out of the of severe sinusitis and ear infection does not want this to happen again is continuing his Flonase and will proceed with his ENT evaluation consider allergy eval. ? ?Will refill albuterol for as needed rescue only( had cough with trelegy) ?-Patient advised to return or notify health care team  if  new concerns arise. ? ?Patient Instructions  ?Advise begin low dose statin and increase as indicated.  ? ?We can increase metformin to 1500 mg per day  .( 3 x 500 mg per day) will send in new rx.  ?We can add  other meds also  such as farxiga  or ( ozempic ). If needed.  Goal below 7.0  ? ?Begin crestor  once a week for 2 weeks and doing ok then increase  to 3 x per week   final goal would be daily and   ldl   below 100   possible 70.  ? ? ?Standley Brooking. Darcy Barbara M.D. ?

## 2022-04-04 ENCOUNTER — Ambulatory Visit: Payer: BC Managed Care – PPO | Admitting: Internal Medicine

## 2022-04-04 ENCOUNTER — Encounter: Payer: Self-pay | Admitting: Internal Medicine

## 2022-04-04 VITALS — BP 140/72 | HR 100 | Temp 98.3°F | Ht 68.0 in | Wt 210.0 lb

## 2022-04-04 DIAGNOSIS — R0981 Nasal congestion: Secondary | ICD-10-CM | POA: Diagnosis not present

## 2022-04-04 DIAGNOSIS — E119 Type 2 diabetes mellitus without complications: Secondary | ICD-10-CM | POA: Diagnosis not present

## 2022-04-04 DIAGNOSIS — Z79899 Other long term (current) drug therapy: Secondary | ICD-10-CM | POA: Diagnosis not present

## 2022-04-04 DIAGNOSIS — I10 Essential (primary) hypertension: Secondary | ICD-10-CM

## 2022-04-04 DIAGNOSIS — E785 Hyperlipidemia, unspecified: Secondary | ICD-10-CM | POA: Diagnosis not present

## 2022-04-04 MED ORDER — ALBUTEROL SULFATE HFA 108 (90 BASE) MCG/ACT IN AERS: 2.0000 | INHALATION_SPRAY | Freq: Four times a day (QID) | RESPIRATORY_TRACT | 1 refills | Status: DC | PRN

## 2022-04-04 MED ORDER — METFORMIN HCL ER 500 MG PO TB24: ORAL_TABLET | ORAL | Status: DC

## 2022-04-04 MED ORDER — ROSUVASTATIN CALCIUM 5 MG PO TABS: ORAL_TABLET | ORAL | 3 refills | Status: DC

## 2022-04-04 NOTE — Patient Instructions (Addendum)
Advise begin low dose statin and increase as indicated.  ? ?We can increase metformin to 1500 mg per day  .( 3 x 500 mg per day) will send in new rx.  ?We can add  other meds also  such as farxiga  or ( ozempic ). If needed.  Goal below 7.0  ? ?Begin crestor  once a week for 2 weeks and doing ok then increase  to 3 x per week   final goal would be daily and  ldl   below 100   possible 70.  ?

## 2022-04-12 ENCOUNTER — Ambulatory Visit (INDEPENDENT_AMBULATORY_CARE_PROVIDER_SITE_OTHER): Payer: BC Managed Care – PPO | Admitting: Psychology

## 2022-04-12 DIAGNOSIS — F902 Attention-deficit hyperactivity disorder, combined type: Secondary | ICD-10-CM

## 2022-04-12 DIAGNOSIS — F330 Major depressive disorder, recurrent, mild: Secondary | ICD-10-CM

## 2022-04-13 NOTE — Progress Notes (Signed)
[facility] Counselor/Therapist Progress Note ? ?Patient ID: Jonathan [name], MRN: [id number],   ? ?Date:04/12/2022 ? ?Time Spent: 60 minutes ? ?Treatment Type: Individual Therapy ? ?Reported Symptoms: sadness, inability to pay attention ? ?Mental Status Exam: ?Appearance:  Casual     ?Behavior: Appropriate  ?Motor: Normal  ?Speech/Language:  Normal Rate  ?Affect: Appropriate  ?Mood: normal  ?Thought process: normal  ?Thought content:   WNL  ?Sensory/Perceptual disturbances:   WNL  ?Orientation: oriented to person, place, time/date, and situation  ?Attention: Good  ?Concentration: Good  ?Memory: WNL  ?Fund of knowledge:  Good  ?Insight:   Good  ?Judgment:  Good  ?Impulse Control: Good  ? ?Risk Assessment: ?Danger to Self:  No ?Self-injurious Behavior: No ?Danger to Others: No ?Duty to Warn:no ?Physical Aggression / Violence:No  ?Access to Firearms a concern: No  ?Gang Involvement:No  ? ?Subjective: The patient attended a face-to-face individual therapy session on web ex today.  The patient gave verbal consent for the session to be on webex  on video.  The patient was home alone and Therapist was in the office.  The patient reports that he has had difficulty with allergies in the last month and has been struggling .  He reports that he had a panic attack and was able to use the tools that we have discussed to manage his panic.  The patient talked about doing some things to take care of himself and realizes that there are more things that he needs to do to assist himself in remaining stable with his moods.  Encouraged patient to get back into a routine of self care.  ? ?Interventions: Cognitive Behavioral Therapy and Insight-Oriented ? ?Diagnosis:Mild episode of recurrent major depressive disorder ([place]) ? ?Attention deficit hyperactivity disorder (ADHD), combined type ? ?Plan: Please see treatment plan in Therapy charts with target date of 09/15/2022.  The patient approved this plan and is making  progress. ? ?Kaydon Creedon G Lunna Vogelgesang, LCSW ? ? ? ? ? ? ? ? ? ? ? ? ? ? ? ? ? ?Charliee Krenz G Sayda Grable, LCSW ? ? ? ? ? ? ? ? ? ? ? ? ? ? ?Maly Lemarr G Bryttney Netzer, LCSW ? ? ? ? ? ? ? ? ? ? ? ? ? ? ?Scotti Kosta G Joyell Emami, LCSW ? ? ? ? ? ? ? ? ? ? ? ? ? ? ?Masson Nalepa G Elizabeth Haff, LCSW ?

## 2022-04-15 DIAGNOSIS — J3489 Other specified disorders of nose and nasal sinuses: Secondary | ICD-10-CM | POA: Diagnosis not present

## 2022-04-15 DIAGNOSIS — J329 Chronic sinusitis, unspecified: Secondary | ICD-10-CM | POA: Diagnosis not present

## 2022-04-15 DIAGNOSIS — J342 Deviated nasal septum: Secondary | ICD-10-CM | POA: Diagnosis not present

## 2022-04-15 DIAGNOSIS — H66011 Acute suppurative otitis media with spontaneous rupture of ear drum, right ear: Secondary | ICD-10-CM | POA: Diagnosis not present

## 2022-04-18 ENCOUNTER — Encounter: Payer: Self-pay | Admitting: Internal Medicine

## 2022-04-18 NOTE — Telephone Encounter (Signed)
Probably not the rosuvastatin . Whatever you may have eaten for lunch of 2-3 hours before could have helped triggered  the  blood sugar  swings.   Especially if were simple carbs   . Monitor closely for another week before increasing  any dosing   and let us know how doing.

## 2022-04-26 ENCOUNTER — Other Ambulatory Visit: Payer: Self-pay

## 2022-04-26 DIAGNOSIS — F330 Major depressive disorder, recurrent, mild: Secondary | ICD-10-CM

## 2022-04-26 MED ORDER — VILAZODONE HCL 10 MG PO TABS: ORAL_TABLET | ORAL | 0 refills | Status: DC

## 2022-04-26 NOTE — Telephone Encounter (Signed)
Pt's insurance requires 90 day Rx's even though his Viibryd was a new Rx. 90 day submitted

## 2022-05-10 ENCOUNTER — Ambulatory Visit (INDEPENDENT_AMBULATORY_CARE_PROVIDER_SITE_OTHER): Payer: BC Managed Care – PPO | Admitting: Psychology

## 2022-05-10 DIAGNOSIS — F330 Major depressive disorder, recurrent, mild: Secondary | ICD-10-CM | POA: Diagnosis not present

## 2022-05-10 DIAGNOSIS — F902 Attention-deficit hyperactivity disorder, combined type: Secondary | ICD-10-CM

## 2022-05-10 NOTE — Progress Notes (Signed)
[facility] Counselor/Therapist Progress Note  Patient ID: Jonathan [name], MRN: [id number],    Date:05/10/2022  Time Spent: 60 minutes  Treatment Type: Individual Therapy  Reported Symptoms: sadness, inability to pay attention  Mental Status Exam: Appearance:  Casual     Behavior: Appropriate  Motor: Normal  Speech/Language:  Normal Rate  Affect: Appropriate  Mood: normal  Thought process: normal  Thought content:   WNL  Sensory/Perceptual disturbances:   WNL  Orientation: oriented to person, place, time/date, and situation  Attention: Good  Concentration: Good  Memory: WNL  Fund of knowledge:  Good  Insight:   Good  Judgment:  Good  Impulse Control: Good   Risk Assessment: Danger to Self:  No Self-injurious Behavior: No Danger to Others: No Duty to Warn:no Physical Aggression / Violence:No  Access to Firearms a concern: No  Gang Involvement:No   Subjective: The patient attended a face-to-face individual therapy session in the office today.  The patient presents as pleasant and cooperative.  The patient and his wife went down to their daughter's graduation in [place] over the last week and that went well.  The patient talked about wanting to start working more on behavioral ways to manage his ADHD as opposed to being on medications.  The patient is going to talk with Thayer Headings tomorrow and see what she is going to do about his medicines however he would like to increase sessions to learn more strategic ways to manage his ADD.  We scheduled several more appointments and will look at strategies moving forward.  Interventions: Cognitive Behavioral Therapy and Insight-Oriented  Diagnosis:Mild episode of recurrent major depressive disorder (HCC)  Attention deficit hyperactivity disorder (ADHD), combined type  Plan: Client Abilities/Strengths  Intelligent, insightful, motivated  Client Treatment Preferences  Outpatient individual therapy  Client  Statement of Needs  "I decided I needed therapy to help me sort through a situation I am dealing with"  Treatment Level  Outpatient Individual therapy  Symptoms  Increased defensiveness with his wife: (Status: improved). Passive -  aggressive behaviors: (Status: improved).  Problems Addressed  adjustment disorder, Vocational Stress, Vocational Stress, Vocational Stress, Vocational Stress,  Vocational Stress  Goals 1. Decrease incidents of exhibiting passive aggressive behavior Objective Learn and implement problem-solving skills. Target Date: 2022-09-15 Frequency: Monthly Progress: 90 Modality: individual Related Interventions 1. Conduct Problem-Solving Therapy (see Problem-Solving Therapy by Shawnee Knapp and Delane Ginger) using techniques such as psychoeducation, modeling, and role-playing to teach the client problem solving skills (i.e., defining a problem specifically, generating possible solutions, evaluating the  pros and cons of each solution, selecting and implementing a plan of action, evaluating the  efficacy of the plan, accepting or revising the plan); role-play application of the problemsolving skill to a real life issue (or assign "Applying Problem-Solving to Interpersonal Conflict" in the Adult Psychotherapy Homework Planner by Bryn Gulling). 2. Improve satisfaction and comfort surrounding coworker relationships. 3. Increase job satisfaction and performance due to implementation of  assertiveness and stress management strategies. 4. Increase sense of confidence and competence in dealing with work  responsibilities. Objective Verbalize healthy, realistic cognitive messages that promote harmony with others, self-acceptance, and  self-confidence. Target Date: 2022-09-15 Frequency: Monthly Progress: 90 Modality: individual 5. Increase sense of self-esteem and elevation of mood in spite of  unemployment. 6. Pursue employment consistency with a reasonably hopeful and  positive attitude. Diagnosis Adult ADD 296.31 (Major depressive affective disorder, recurrent episode, mild) - 309.28 (Adjustment disorder with mixed anxiety and depressed mood) - Conditions For Discharge Achievement  of treatment goals and objectives  The patient approved this plan and is making progress.  Euclide Granito G Micheale Schlack, LCSW                  Dajanay Northrup G Apryl Brymer, LCSW               Shali Vesey G Ninel Abdella, LCSW               Keyla Milone G Ollie Delano, LCSW               Evelena Masci G Elvena Oyer, LCSW               Hanalei Glace G Tyrel Lex, LCSW

## 2022-05-11 ENCOUNTER — Encounter: Payer: Self-pay | Admitting: Psychiatry

## 2022-05-11 ENCOUNTER — Telehealth (INDEPENDENT_AMBULATORY_CARE_PROVIDER_SITE_OTHER): Payer: BC Managed Care – PPO | Admitting: Psychiatry

## 2022-05-11 VITALS — BP 150/91 | HR 68

## 2022-05-11 DIAGNOSIS — F330 Major depressive disorder, recurrent, mild: Secondary | ICD-10-CM

## 2022-05-11 DIAGNOSIS — F902 Attention-deficit hyperactivity disorder, combined type: Secondary | ICD-10-CM

## 2022-05-11 DIAGNOSIS — F410 Panic disorder [episodic paroxysmal anxiety] without agoraphobia: Secondary | ICD-10-CM | POA: Diagnosis not present

## 2022-05-11 MED ORDER — VILAZODONE HCL 10 MG PO TABS: ORAL_TABLET | ORAL | 0 refills | Status: DC

## 2022-05-11 NOTE — Progress Notes (Signed)
Jonathan [name] [id number] [birth year] 61 y.o.  Virtual Visit via Video Note  I connected with pt @ on 05/11/22 at  8:30 AM EDT by a video enabled telemedicine application and verified that I am speaking with the correct person using two identifiers.   I discussed the limitations of evaluation and management by telemedicine and the availability of in person appointments. The patient expressed understanding and agreed to proceed.  I discussed the assessment and treatment plan with the patient. The patient was provided an opportunity to ask questions and all were answered. The patient agreed with the plan and demonstrated an understanding of the instructions.   The patient was advised to call back or seek an in-person evaluation if the symptoms worsen or if the condition fails to improve as anticipated.  I provided 45 minutes of non-face-to-face time during this encounter.  The patient was located at home.  The provider was located at home.   Thayer Headings, PMHNP   Subjective:   Patient ID:  Jonathan [name] is a 61 y.o. (DOB Nov 04, 1961) male.  Chief Complaint:  Chief Complaint  Patient presents with   Medication Problem    Possible worsening concentration, fatigue, and mood when Modafinil wears off   Depression   ADHD   Follow-up    Anxiety    HPI Curt Oatis Reidinger presents for follow-up of depression, anxiety, and ADHD. He reports that he and his wife have noticed possible side effects with Modafinil.   He reports feeling "meh." He reports that Modafinil has helped him when he takes it. He reports that he does not tend to take Modafinil when he is home or take the second dose. He reports feeling as if Modafinil is "slowing my processor down" and his wife has made comments about this. He describes being easily overwhelmed and possible hyper-focus. He reports that he has thought that it may be preferable not to take medication for ADHD. He reports that his focus is better when he  has just taken Modafinil. He may be having worsening concentration and changes in mood when Modafinil wears off. He typically takes Modafinil 1/2 tab daily. He reports on rare occasions he may take a 1/2 tab some afternoons when he has evening meetings.   He is sleeping 7-8 hours and does not feel rested upon awakening. Denies any middle of the night awakenings other than to use the bathroom. He reports continual fatigue and it is worse later in the day. Motivation has been lagging at times. He questions if Modafinil may be interfering with his sleep. Unaware of snoring. He continues to have coughing at night. Denies any observed or perceived episodes of apnea.   He has been on Viibryd since May 1st. He has not noticed any improvements in mood. He reports continued low mood. He reports some feelings of frustration, typically in the afternoon and evening. Denies anxiety. He has not needed Xanax prn. Denies SI.   Daughter just graduated college last weekend and has moved back home.   Past Psychiatric Medication Trials: Citalopram- Sexual side effects, fatigue. Partially effective for depression. Increased to 15 mg in the last month and noticed more fatigue. He reports that he has been taking 10 mg most of the time since then.  Trintellix- Improved mood, anxiety, and focus on 5 mg. Excessive somnolence on 10 mg. Pristiq Wellbutrin- Took 25 years ago. Cannot recall response. Concerta Modafinil  Review of Systems:  Review of Systems  Cardiovascular:  He reports that for while his pulse was elevated and recently pulse has been lower.   Gastrointestinal: Negative.   Endocrine:       Reports glucose readings have been in range the last couple of weeks  Musculoskeletal:  Negative for gait problem.  Neurological:  Negative for tremors.  Psychiatric/Behavioral:         Please refer to HPI   Medications: I have reviewed the patient's current medications.  Current Outpatient Medications   Medication Sig Dispense Refill   rosuvastatin (CRESTOR) 5 MG tablet Take one by mouth once a week for 2 weeks  then increase  to 3 x per week  or as directed . (Patient taking differently: 5 mg once a week. Take one by mouth once a week for 2 weeks  then increase  to 3 x per week  or as directed .) 36 tablet 3   albuterol (VENTOLIN HFA) 108 (90 Base) MCG/ACT inhaler Inhale 2 puffs into the lungs every 6 (six) hours as needed for wheezing or shortness of breath. 18 g 1   ALPRAZolam (XANAX) 0.5 MG tablet Take 1/2-1 tab as needed every 4-6 hours as needed for severe anxiety and panic 30 tablet 0   fexofenadine (ALLEGRA) 180 MG tablet Take 1 tablet (180 mg total) by mouth daily. 90 tablet 1   fluticasone (FLONASE) 50 MCG/ACT nasal spray Place 1 spray into both nostrils daily. Begin by using 2 sprays in each nare daily for 3 to 5 days, then decrease to 1 spray in each nare daily. 32 mL 1   guaifenesin (HUMIBID E) 400 MG TABS tablet Take 1 tablet 3 times daily as needed for chest congestion and cough 21 tablet 0   ipratropium (ATROVENT) 0.06 % nasal spray Place 2 sprays into both nostrils 3 (three) times daily. As needed for nasal congestion, runny nose 15 mL 1   losartan-hydrochlorothiazide (HYZAAR) 50-12.5 MG tablet TAKE 2 TABLETS DAILY 180 tablet 3   metFORMIN (GLUCOPHAGE-XR) 500 MG 24 hr tablet Take by mouth  3 per day as directed ( total '1500mg'$ ) 270 tablet `   modafinil (PROVIGIL) 200 MG tablet Take 1/2 tablet twice daily as needed 90 tablet 2   montelukast (SINGULAIR) 10 MG tablet Take 1 tablet (10 mg total) by mouth at bedtime. 30 tablet 2   Probiotic Product (PROBIOTIC PO) Take 1 tablet by mouth every other day.     Vilazodone HCl (VIIBRYD) 10 MG TABS Take 1.5 tablet daily with breakfast for one week, then increase to 2 tablets daily with breakfast 90 tablet 0   No current facility-administered medications for this visit.    Medication Side Effects: Other: Possible worsening concentration,  fatigue when Modafinil wears off  Allergies:  Allergies  Allergen Reactions   Statins Other (See Comments)    myalgias     Zocor [Simvastatin]      myalgias   Penicillins Hives    Per pt - happened in childhood - hives possible reaction    Past Medical History:  Diagnosis Date   Allergic rhinitis    Allergy    Anxiety    Depression    Diabetes mellitus without complication (HCC)    type 2   Heart murmur    per pt told it was functional   History of kidney stones    History of nephrolithiasis    Hyperlipidemia    Hypertension    Sebaceous cyst     Family History  Problem Relation Age of Onset  Diabetes Father    Hypertension Father    Kidney cancer Father        now on dialysis   Kidney Stones Father    Kidney Stones Brother    Huntington's disease Maternal Grandfather    Depression Daughter    Anxiety disorder Daughter    ADD / ADHD Daughter    Colon polyps Daughter 13   Colon cancer Neg Hx    Esophageal cancer Neg Hx    Stomach cancer Neg Hx    Rectal cancer Neg Hx     Social History   Socioeconomic History   Marital status: Married    Spouse name: Not on file   Number of children: 1   Years of education: Not on file   Highest education level: Not on file  Occupational History   Occupation: MINISTER    Employer: [employer]  Tobacco Use   Smoking status: Never   Smokeless tobacco: Never  Vaping Use   Vaping Use: Never used  Substance and Sexual Activity   Alcohol use: Yes    Comment: less than 1 a week   Drug use: No   Sexual activity: Yes  Other Topics Concern   Not on file  Social History Narrative   Married   Regular exercise- no   HH of 3   Dog car and lizard   Sleep wakening at times   New job [employer]   Per week reg sleep.    Social Determinants of Health   Financial Resource Strain: Not on file  Food Insecurity: Not on file  Transportation Needs: Not on file  Physical Activity: Not on file  Stress: Not on  file  Social Connections: Not on file  Intimate Partner Violence: Not on file    Past Medical History, Surgical history, Social history, and Family history were reviewed and updated as appropriate.   Please see review of systems for further details on the patient's review from today.   Objective:   Physical Exam:  BP (!) 150/91   Pulse 68   Physical Exam Constitutional:      General: He is not in acute distress. Musculoskeletal:        General: No deformity.  Neurological:     Mental Status: He is alert and oriented to person, place, and time.     Coordination: Coordination normal.  Psychiatric:        Attention and Perception: Attention and perception normal. He does not perceive auditory or visual hallucinations.        Mood and Affect: Mood is not anxious. Affect is not labile, blunt, angry or inappropriate.        Speech: Speech normal.        Behavior: Behavior normal.        Thought Content: Thought content normal. Thought content is not paranoid or delusional. Thought content does not include homicidal or suicidal ideation. Thought content does not include homicidal or suicidal plan.        Cognition and Memory: Cognition and memory normal.        Judgment: Judgment normal.     Comments: Insight intact Dysthymic mood    Lab Review:     Component Value Date/Time   NA 141 03/30/2022 0747   NA 141 08/17/2021 0000   K 4.7 03/30/2022 0747   CL 101 03/30/2022 0747   CO2 30 03/30/2022 0747   GLUCOSE 141 (H) 03/30/2022 0747   BUN 16 03/30/2022 0747  CREATININE 1.22 03/30/2022 0747   CREATININE 1.15 10/19/2020 1206   CALCIUM 9.3 03/30/2022 0747   PROT 6.5 03/30/2022 0747   ALBUMIN 4.1 03/30/2022 0747   AST 18 03/30/2022 0747   ALT 27 03/30/2022 0747   ALKPHOS 65 03/30/2022 0747   BILITOT 0.7 03/30/2022 0747   GFRNONAA 72.1 08/17/2021 0000   GFRNONAA 68 10/19/2020 1206   GFRAA 83.5 08/17/2021 0000   GFRAA 79 10/19/2020 1206       Component Value Date/Time    WBC 9.6 03/30/2022 0747   RBC 5.22 03/30/2022 0747   HGB 16.3 03/30/2022 0747   HCT 48.4 03/30/2022 0747   PLT 358.0 03/30/2022 0747   MCV 92.8 03/30/2022 0747   MCV 94.2 04/29/2013 0936   MCH 30.9 10/19/2020 1206   MCHC 33.7 03/30/2022 0747   RDW 13.3 03/30/2022 0747   LYMPHSABS 2.3 03/30/2022 0747   MONOABS 0.9 03/30/2022 0747   EOSABS 0.2 03/30/2022 0747   BASOSABS 0.1 03/30/2022 0747    No results found for: POCLITH, LITHIUM   No results found for: PHENYTOIN, PHENOBARB, VALPROATE, CBMZ   .res Assessment: Plan:   Pt seen for 45 minutes and time spent discussing his concerns about possible side effects with Modafinil. Discussed that he may be experiencing concentration difficulties, low energy, and mood changes when Modafinil wears off. He reports that he would prefer not to take Modafinil since concentration difficulties when it wears off are more significant than untreated ADHD symptoms.Discussed Modafinil could be gradually decreased to prevent risk of possible discontinuation. Recommend decreasing Modafinil to 200 mg to 1/4 tab for several days, then stopping due to possible adverse effects.  Discussed that he is currently taking a low dose of Viibryd and recommend increasing dose to improve mood s/s since he denies any side effects with Viibryd. Recommend increasing Viibryd after discontinuation of Modafinil to avoid making multiple changes at one time and pt is in agreement with this plan.  Will increase Vilazodone (Viibryd) to 10 mg 1.5 tabs daily with breakfast for one week, then increase to 2 tabs daily with breakfast. Recommend continuing therapy with Bambi Cottle, LCSW.  Pt to follow-up with this provider in 5 weeks or sooner if clinically indicated.  Patient advised to contact office with any questions, adverse effects, or acute worsening in signs and symptoms.    Foy was seen today for medication problem, depression, adhd and follow-up.  Diagnoses and all orders for  this visit:  Mild episode of recurrent major depressive disorder (HCC) -     Vilazodone HCl (VIIBRYD) 10 MG TABS; Take 1.5 tablet daily with breakfast for one week, then increase to 2 tablets daily with breakfast  Attention deficit hyperactivity disorder (ADHD), combined type  Panic -     Vilazodone HCl (VIIBRYD) 10 MG TABS; Take 1.5 tablet daily with breakfast for one week, then increase to 2 tablets daily with breakfast     Please see After Visit Summary for patient specific instructions.  Future Appointments  Date Time Provider [place]  06/09/2022  8:00 AM Cottle, Bambi G, LCSW [facility] None  06/14/2022  9:00 AM Thayer Headings, PMHNP [facility] None  07/05/2022  7:20 AM [facility]  07/07/2022  8:00 AM Cottle, Bambi G, LCSW [facility] None  07/11/2022 11:00 AM Panosh, Standley Brooking, MD [facility]    No orders of the defined types were placed in this encounter.     -------------------------------

## 2022-05-17 ENCOUNTER — Ambulatory Visit: Payer: BC Managed Care – PPO | Admitting: Psychiatry

## 2022-05-27 ENCOUNTER — Ambulatory Visit (INDEPENDENT_AMBULATORY_CARE_PROVIDER_SITE_OTHER): Payer: BC Managed Care – PPO | Admitting: Psychology

## 2022-05-27 DIAGNOSIS — F902 Attention-deficit hyperactivity disorder, combined type: Secondary | ICD-10-CM | POA: Diagnosis not present

## 2022-05-27 DIAGNOSIS — F330 Major depressive disorder, recurrent, mild: Secondary | ICD-10-CM | POA: Diagnosis not present

## 2022-06-09 ENCOUNTER — Ambulatory Visit (INDEPENDENT_AMBULATORY_CARE_PROVIDER_SITE_OTHER): Payer: BC Managed Care – PPO | Admitting: Psychology

## 2022-06-09 DIAGNOSIS — Z630 Problems in relationship with spouse or partner: Secondary | ICD-10-CM | POA: Diagnosis not present

## 2022-06-09 DIAGNOSIS — F330 Major depressive disorder, recurrent, mild: Secondary | ICD-10-CM

## 2022-06-09 DIAGNOSIS — F902 Attention-deficit hyperactivity disorder, combined type: Secondary | ICD-10-CM

## 2022-06-10 NOTE — Progress Notes (Signed)
[facility] Counselor/Therapist Progress Note  Patient ID: OTHER ATIENZA, MRN: [id number],    Date:06/09/2022  Time Spent: 60 minutes  Treatment Type: Individual Therapy  Reported Symptoms: sadness, inability to pay attention  Mental Status Exam: Appearance:  Casual     Behavior: Appropriate  Motor: Normal  Speech/Language:  Normal Rate  Affect: Appropriate  Mood: normal  Thought process: normal  Thought content:   WNL  Sensory/Perceptual disturbances:   WNL  Orientation: oriented to person, place, time/date, and situation  Attention: Good  Concentration: Good  Memory: WNL  Fund of knowledge:  Good  Insight:   Good  Judgment:  Good  Impulse Control: Good   Risk Assessment: Danger to Self:  No Self-injurious Behavior: No Danger to Others: No Duty to Warn:no Physical Aggression / Violence:No  Access to Firearms a concern: No  Gang Involvement:No   Subjective: The patient attended a face-to-face individual therapy session in the office today.  The patient presents as pleasant and cooperative.  The patient reports that he has spent the last few weeks getting himself more ready to implement some of the strategies that we have discussed.  The patient states that he is trying to come up with a schedule for doing things to take care of himself.  He reports that he has not done his morning papers since March 2022.  We talked about the importance of getting his schedule started and being mindful of routine.  The patient states that he feels that he put too much emphasis on getting a medication that was going to fix the problem.  We talked about more strategies for him to implement and are planning to explore a little more EMDR related to why he lets his needs to go to care for others.  Interventions: Cognitive Behavioral Therapy and Insight-Oriented  Diagnosis:Mild episode of recurrent major depressive disorder ([place])  Attention deficit hyperactivity disorder  (ADHD), combined type  Marital problems  Plan: Client Abilities/Strengths  Intelligent, insightful, motivated  Client Treatment Preferences  Outpatient individual therapy  Client Statement of Needs  "I decided I needed therapy to help me sort through a situation I am dealing with"  Treatment Level  Outpatient Individual therapy  Symptoms  Increased defensiveness with his wife: (Status: improved). Passive -  aggressive behaviors: (Status: improved).  Problems Addressed  adjustment disorder, Vocational Stress, Vocational Stress, Vocational Stress, Vocational Stress,  Vocational Stress  Goals 1. Decrease incidents of exhibiting passive aggressive behavior Objective Learn and implement problem-solving skills. Target Date: 2022-09-15 Frequency: Monthly Progress: 90 Modality: individual Related Interventions 1. Conduct Problem-Solving Therapy (see Problem-Solving Therapy by Shawnee Knapp and Delane Ginger) using techniques such as psychoeducation, modeling, and role-playing to teach the client problem solving skills (i.e., defining a problem specifically, generating possible solutions, evaluating the  pros and cons of each solution, selecting and implementing a plan of action, evaluating the  efficacy of the plan, accepting or revising the plan); role-play application of the problemsolving skill to a real life issue (or assign "Applying Problem-Solving to Interpersonal Conflict" in the Adult Psychotherapy Homework Planner by Bryn Gulling). 2. Improve satisfaction and comfort surrounding coworker relationships. 3. Increase job satisfaction and performance due to implementation of  assertiveness and stress management strategies. 4. Increase sense of confidence and competence in dealing with work  responsibilities. Objective Verbalize healthy, realistic cognitive messages that promote harmony with others, self-acceptance, and  self-confidence. Target Date: 2022-09-15 Frequency: Monthly Progress: 90  Modality: individual 5. Increase sense of self-esteem and elevation of mood in  spite of  unemployment. 6. Pursue employment consistency with a reasonably hopeful and positive attitude. Diagnosis Adult ADD 296.31 (Major depressive affective disorder, recurrent episode, mild) - 309.28 (Adjustment disorder with mixed anxiety and depressed mood) - Conditions For Discharge Achievement of treatment goals and objectives  The patient approved this plan and is making progress.  Kearstyn Avitia G Shelene Krage, LCSW                  Arnez Stoneking G Lemar Bakos, LCSW               Anderia Lorenzo G Marigold Mom, LCSW               Kristapher [place] G Shanequia Kendrick, LCSW               Rick Carruthers G Tayron Hunnell, LCSW               Simranjit Thayer G Vedanth Sirico, LCSW               Keria Widrig G Draya Felker, LCSW               Evanell Redlich G Bryce Kimble, LCSW

## 2022-06-14 ENCOUNTER — Ambulatory Visit: Payer: BC Managed Care – PPO | Admitting: Psychiatry

## 2022-06-14 DIAGNOSIS — F410 Panic disorder [episodic paroxysmal anxiety] without agoraphobia: Secondary | ICD-10-CM | POA: Diagnosis not present

## 2022-06-14 DIAGNOSIS — H90A22 Sensorineural hearing loss, unilateral, left ear, with restricted hearing on the contralateral side: Secondary | ICD-10-CM | POA: Diagnosis not present

## 2022-06-14 DIAGNOSIS — F330 Major depressive disorder, recurrent, mild: Secondary | ICD-10-CM

## 2022-06-14 DIAGNOSIS — F902 Attention-deficit hyperactivity disorder, combined type: Secondary | ICD-10-CM | POA: Diagnosis not present

## 2022-06-14 MED ORDER — VILAZODONE HCL 20 MG PO TABS: 20.0000 mg | ORAL_TABLET | Freq: Every day | ORAL | 1 refills | Status: DC

## 2022-06-14 NOTE — Progress Notes (Signed)
Jonathan [name] [id number] [birth year] 61 y.o.  Subjective:   Patient ID:  Jonathan [name] is a 61 y.o. (DOB 1961-11-01) male.  Chief Complaint:  Chief Complaint  Patient presents with   Follow-up    Anxiety, depression, and ADHD    HPI Jonathan [name] presents to the office today for follow-up of anxiety, depression, and ADHD. He reports feeling "better" since stopping Modafinil. He reports that he had some tension and feeling edgy with Modafinil "but I like what it does to help me focus." He reports, "I finally feel like the Viibryd is for the first time really working." He notices a "lift" in his mood since taking Viibryd with food. He reports that "I would like a little more lift."  He reports that his wife and daughter have observed an improvement in his mood. He has not had any anxiety or panic recently and has not needed Alprazolam prn. He reports some difficulty with concentration and has been using compensatory strategies and working with therapist to develop strategies. Sleeping well. Started an exercise program yesterday and noticed improved sleep. Energy "is a lot better." Motivation has improved some as well. Appetite has been ok or slightly less. Denies SI.   Past Psychiatric Medication Trials: Citalopram- Sexual side effects, fatigue. Partially effective for depression. Increased to 15 mg in the last month and noticed more fatigue. He reports that he has been taking 10 mg most of the time since then.  Trintellix- Improved mood, anxiety, and focus on 5 mg. Excessive somnolence on 10 mg. Pristiq Wellbutrin- Took 25 years ago. Cannot recall response. Concerta Modafinil-Helpful for concentration. Caused some edginess     PHQ2-9    [place] Office Visit from 05/05/2021 in [place] at [facility] from 10/19/2020 in [place] at [place] from 08/02/2019 in [place] at [facility] from 10/01/2018 in [place] at [facility] from 05/24/2017 in [place] at [facility] Total Score 2 0 4 0 0  PHQ-9 Total Score '12 9 15 '$ -- --      Flowsheet Row ED from 03/21/2022 in [facility] [place] [facility] from 03/17/2022 in [facility] [place] [facility] from 07/16/2021 in [facility] [place] No Risk No Risk Error: Question 6 not populated        Review of Systems:  Review of Systems  Gastrointestinal: Negative.   Endocrine:       Improved glycemic control  Musculoskeletal:  Negative for gait problem.  Psychiatric/Behavioral:         Please refer to HPI    Medications: I have reviewed the patient's current medications.  Current Outpatient Medications  Medication Sig Dispense Refill   Vilazodone HCl 20 MG TABS Take 1 tablet (20 mg total) by mouth daily with breakfast. 30 tablet 1   albuterol (VENTOLIN HFA) 108 (90 Base) MCG/ACT inhaler Inhale 2 puffs into the lungs every 6 (six) hours as needed for wheezing or shortness of breath. 18 g 1   ALPRAZolam (XANAX) 0.5 MG tablet Take 1/2-1 tab as needed every 4-6 hours as needed for severe anxiety and panic 30 tablet 0   fexofenadine (ALLEGRA) 180 MG tablet Take 1 tablet (180 mg total) by mouth daily. 90 tablet 1   fluticasone (FLONASE) 50 MCG/ACT nasal spray Place 1 spray into both nostrils daily. Begin by using 2 sprays in each nare daily for 3  to 5 days, then decrease to 1 spray in each nare daily. 32 mL 1   guaifenesin (HUMIBID E) 400 MG TABS tablet Take 1 tablet 3 times daily as needed for chest congestion and cough 21 tablet 0   ipratropium (ATROVENT) 0.06 % nasal spray Place 2 sprays into both nostrils 3 (three) times daily. As needed for nasal congestion, runny nose 15 mL 1   losartan-hydrochlorothiazide (HYZAAR) 50-12.5 MG tablet TAKE 2 TABLETS DAILY 180 tablet 3   metFORMIN (GLUCOPHAGE-XR) 500 MG 24 hr tablet Take by mouth  3 per day as  directed ( total '1500mg'$ ) 270 tablet `   montelukast (SINGULAIR) 10 MG tablet Take 1 tablet (10 mg total) by mouth at bedtime. 30 tablet 2   Probiotic Product (PROBIOTIC PO) Take 1 tablet by mouth every other day.     rosuvastatin (CRESTOR) 5 MG tablet Take one by mouth once a week for 2 weeks  then increase  to 3 x per week  or as directed . (Patient taking differently: 5 mg once a week. Take one by mouth once a week for 2 weeks  then increase  to 3 x per week  or as directed .) 36 tablet 3   Vilazodone HCl (VIIBRYD) 10 MG TABS Take 1.5 tablet daily with breakfast for one week, then increase to 2 tablets daily with breakfast 90 tablet 0   No current facility-administered medications for this visit.    Medication Side Effects: None  Allergies:  Allergies  Allergen Reactions   Statins Other (See Comments)    myalgias     Zocor [Simvastatin]      myalgias   Penicillins Hives    Per pt - happened in childhood - hives possible reaction    Past Medical History:  Diagnosis Date   Allergic rhinitis    Allergy    Anxiety    Depression    Diabetes mellitus without complication (HCC)    type 2   Heart murmur    per pt told it was functional   History of kidney stones    History of nephrolithiasis    Hyperlipidemia    Hypertension    Sebaceous cyst     Past Medical History, Surgical history, Social history, and Family history were reviewed and updated as appropriate.   Please see review of systems for further details on the patient's review from today.   Objective:   Physical Exam:  There were no vitals taken for this visit.  Physical Exam Constitutional:      General: He is not in acute distress. Musculoskeletal:        General: No deformity.  Neurological:     Mental Status: He is alert and oriented to person, place, and time.     Coordination: Coordination normal.  Psychiatric:        Attention and Perception: Attention and perception normal. He does not perceive  auditory or visual hallucinations.        Mood and Affect: Mood is not anxious. Affect is not labile, blunt, angry or inappropriate.        Speech: Speech normal.        Behavior: Behavior normal.        Thought Content: Thought content normal. Thought content is not paranoid or delusional. Thought content does not include homicidal or suicidal ideation. Thought content does not include homicidal or suicidal plan.        Cognition and Memory: Cognition and memory normal.  Judgment: Judgment normal.     Comments: Insight intact Mood presents as less anxious and less depressed compared to last visit     Lab Review:     Component Value Date/Time   NA 141 03/30/2022 0747   NA 141 08/17/2021 0000   K 4.7 03/30/2022 0747   CL 101 03/30/2022 0747   CO2 30 03/30/2022 0747   GLUCOSE 141 (H) 03/30/2022 0747   BUN 16 03/30/2022 0747   CREATININE 1.22 03/30/2022 0747   CREATININE 1.15 10/19/2020 1206   CALCIUM 9.3 03/30/2022 0747   PROT 6.5 03/30/2022 0747   ALBUMIN 4.1 03/30/2022 0747   AST 18 03/30/2022 0747   ALT 27 03/30/2022 0747   ALKPHOS 65 03/30/2022 0747   BILITOT 0.7 03/30/2022 0747   GFRNONAA 72.1 08/17/2021 0000   GFRNONAA 68 10/19/2020 1206   GFRAA 83.5 08/17/2021 0000   GFRAA 79 10/19/2020 1206       Component Value Date/Time   WBC 9.6 03/30/2022 0747   RBC 5.22 03/30/2022 0747   HGB 16.3 03/30/2022 0747   HCT 48.4 03/30/2022 0747   PLT 358.0 03/30/2022 0747   MCV 92.8 03/30/2022 0747   MCV 94.2 04/29/2013 0936   MCH 30.9 10/19/2020 1206   MCHC 33.7 03/30/2022 0747   RDW 13.3 03/30/2022 0747   LYMPHSABS 2.3 03/30/2022 0747   MONOABS 0.9 03/30/2022 0747   EOSABS 0.2 03/30/2022 0747   BASOSABS 0.1 03/30/2022 0747    No results found for: "POCLITH", "LITHIUM"   No results found for: "PHENYTOIN", "PHENOBARB", "VALPROATE", "CBMZ"   .res Assessment: Plan:   Pt seen for 30 minutes and time spent discussing treatment plan. He reports that side effects of  Modafinil were outweighing benefits and he prefers to discontinue Modafinil. He reports that he has noticed some improvement in mood and anxiety symptoms with Viibryd and would like to increase Viibryd dosage further. Will increase Viibryd to 20 mg daily to further improve mood and anxiety. Discussed that he could take 15 mg daily for several days to a week before increasing to 20 mg to minimize risk of side effects.  Continue Alprazolam 0.5 mg 1/2-1 tab daily as needed for severe anxiety or panic. He reports that he does not need a refill at this time since he has not needed Alprazolam prn.  Recommend continuing therapy with Bambi Cottle, LCSW.  Pt to follow-up with this provider in 5 weeks or sooner if clinically indicated.  Patient advised to contact office with any questions, adverse effects, or acute worsening in signs and symptoms.   Jonathan [name] was seen today for follow-up.  Diagnoses and all orders for this visit:  Mild episode of recurrent major depressive disorder (HCC) -     Vilazodone HCl 20 MG TABS; Take 1 tablet (20 mg total) by mouth daily with breakfast.  Panic -     Vilazodone HCl 20 MG TABS; Take 1 tablet (20 mg total) by mouth daily with breakfast.  Attention deficit hyperactivity disorder (ADHD), combined type     Please see After Visit Summary for patient specific instructions.  Future Appointments  Date Time Provider [place]  06/21/2022  3:00 PM Cottle, Bambi G, LCSW [facility] None  07/05/2022  7:20 AM [facility]  07/07/2022  8:00 AM Cottle, Bambi G, LCSW [facility] None  07/11/2022 11:00 AM Panosh, Standley Brooking, MD [facility]  07/19/2022  9:00 AM Thayer Headings, PMHNP [facility] None  07/21/2022  8:00 AM Cottle, Bambi G, LCSW [facility] None  08/11/2022  8:00 AM Cottle, Bambi G, LCSW [facility] None    No orders of the defined types were placed in this encounter.   -------------------------------

## 2022-06-17 NOTE — Telephone Encounter (Signed)
none

## 2022-06-18 ENCOUNTER — Encounter: Payer: Self-pay | Admitting: Psychiatry

## 2022-06-21 ENCOUNTER — Ambulatory Visit (INDEPENDENT_AMBULATORY_CARE_PROVIDER_SITE_OTHER): Payer: BC Managed Care – PPO | Admitting: Psychology

## 2022-06-21 DIAGNOSIS — F330 Major depressive disorder, recurrent, mild: Secondary | ICD-10-CM

## 2022-06-21 DIAGNOSIS — F902 Attention-deficit hyperactivity disorder, combined type: Secondary | ICD-10-CM

## 2022-06-21 DIAGNOSIS — Z630 Problems in relationship with spouse or partner: Secondary | ICD-10-CM

## 2022-06-21 NOTE — Progress Notes (Signed)
[facility] Counselor/Therapist Progress Note  Patient ID: JAVEON MACMURRAY, MRN: [id number],    Date:06/21/2022  Time Spent: 60 minutes  Treatment Type: Individual Therapy  Reported Symptoms: sadness, inability to pay attention  Mental Status Exam: Appearance:  Casual     Behavior: Appropriate  Motor: Normal  Speech/Language:  Normal Rate  Affect: Appropriate  Mood: normal  Thought process: normal  Thought content:   WNL  Sensory/Perceptual disturbances:   WNL  Orientation: oriented to person, place, time/date, and situation  Attention: Good  Concentration: Good  Memory: WNL  Fund of knowledge:  Good  Insight:   Good  Judgment:  Good  Impulse Control: Good   Risk Assessment: Danger to Self:  No Self-injurious Behavior: No Danger to Others: No Duty to Warn:no Physical Aggression / Violence:No  Access to Firearms a concern: No  Gang Involvement:No   Subjective: The patient attended a face-to-face individual therapy session in the office today.  The patient presents as pleasant and cooperative.  The patient talked today about his process of taking better care of himself.  He has started exercising and doing his morning papers again.  The patient also reports that he went with his wife to her last therapy session at her request and they talked about their struggles with intimacy.  I encouraged the patient to own his own truth and to respect her truth but not allow it just to be 1 way or the other.  The patient has struggled with this for quite some time and his wife has basically decided that she does not want to be intimate anymore in their marriage.  I encouraged him to follow through and make sure that they go to a marriage counselor to work through this issue as he wants intimacy and she does not.  The patient reports that he would like to continue every other week for a while just to hold himself accountable to doing what he needs to do to take care of  himself.  Interventions: Cognitive Behavioral Therapy and Insight-Oriented  Diagnosis:Mild episode of recurrent major depressive disorder ([place])  Attention deficit hyperactivity disorder (ADHD), combined type  Marital problems  Plan: Client Abilities/Strengths  Intelligent, insightful, motivated  Client Treatment Preferences  Outpatient individual therapy  Client Statement of Needs  "I decided I needed therapy to help me sort through a situation I am dealing with"  Treatment Level  Outpatient Individual therapy  Symptoms  Increased defensiveness with his wife: (Status: improved). Passive -  aggressive behaviors: (Status: improved).  Problems Addressed  adjustment disorder, Vocational Stress, Vocational Stress, Vocational Stress, Vocational Stress,  Vocational Stress  Goals 1. Decrease incidents of exhibiting passive aggressive behavior Objective Learn and implement problem-solving skills. Target Date: 2022-09-15 Frequency: Monthly Progress: 90 Modality: individual Related Interventions 1. Conduct Problem-Solving Therapy (see Problem-Solving Therapy by Shawnee Knapp and Delane Ginger) using techniques such as psychoeducation, modeling, and role-playing to teach the client problem solving skills (i.e., defining a problem specifically, generating possible solutions, evaluating the  pros and cons of each solution, selecting and implementing a plan of action, evaluating the  efficacy of the plan, accepting or revising the plan); role-play application of the problemsolving skill to a real life issue (or assign "Applying Problem-Solving to Interpersonal Conflict" in the Adult Psychotherapy Homework Planner by Bryn Gulling). 2. Improve satisfaction and comfort surrounding coworker relationships. 3. Increase job satisfaction and performance due to implementation of  assertiveness and stress management strategies. 4. Increase sense of confidence and competence in dealing with work  responsibilities. Objective Verbalize healthy, realistic cognitive messages that promote harmony with others, self-acceptance, and  self-confidence. Target Date: 2022-09-15 Frequency: Monthly Progress: 90 Modality: individual 5. Increase sense of self-esteem and elevation of mood in spite of  unemployment. 6. Pursue employment consistency with a reasonably hopeful and positive attitude. Diagnosis Adult ADD 296.31 (Major depressive affective disorder, recurrent episode, mild) - 309.28 (Adjustment disorder with mixed anxiety and depressed mood) - Conditions For Discharge Achievement of treatment goals and objectives  The patient approved this plan and is making progress.  Tamana Hatfield G Dayden Viverette, LCSW                  Skylar Priest G Dylan Ruotolo, LCSW               Guilianna Mckoy G Eliana Lueth, LCSW               Abrey Bradway G Brienna Bass, LCSW               Myrtha Tonkovich G Pernella Ackerley, LCSW               Alisah Grandberry G Jora Galluzzo, LCSW               Jilda Kress G Durwood Dittus, LCSW               Shelsea Hangartner G Kailey Esquilin, LCSW               Maxx Calaway G Nneka Blanda, LCSW

## 2022-07-05 ENCOUNTER — Other Ambulatory Visit (INDEPENDENT_AMBULATORY_CARE_PROVIDER_SITE_OTHER): Payer: BC Managed Care – PPO

## 2022-07-05 DIAGNOSIS — I10 Essential (primary) hypertension: Secondary | ICD-10-CM

## 2022-07-05 DIAGNOSIS — E119 Type 2 diabetes mellitus without complications: Secondary | ICD-10-CM | POA: Diagnosis not present

## 2022-07-05 DIAGNOSIS — Z79899 Other long term (current) drug therapy: Secondary | ICD-10-CM | POA: Diagnosis not present

## 2022-07-05 DIAGNOSIS — E785 Hyperlipidemia, unspecified: Secondary | ICD-10-CM | POA: Diagnosis not present

## 2022-07-05 LAB — LIPID PANEL
Cholesterol: 223 mg/dL — ABNORMAL HIGH (ref 0–200)
HDL: 49.2 mg/dL (ref >39.00)
LDL Cholesterol: 144 mg/dL — ABNORMAL HIGH (ref 0–99)
NonHDL: 174.12
Total CHOL/HDL Ratio: 5
Triglycerides: 150 mg/dL — ABNORMAL HIGH (ref 0.0–149.0)
VLDL: 30 mg/dL (ref 0.0–40.0)

## 2022-07-05 LAB — HEMOGLOBIN A1C: Hgb A1c MFr Bld: 7 % — ABNORMAL HIGH (ref 4.6–6.5)

## 2022-07-05 NOTE — Progress Notes (Signed)
Improved  but not yet at goal Will review at upcoming visit .

## 2022-07-07 ENCOUNTER — Ambulatory Visit (INDEPENDENT_AMBULATORY_CARE_PROVIDER_SITE_OTHER): Payer: BC Managed Care – PPO | Admitting: Psychology

## 2022-07-07 DIAGNOSIS — F330 Major depressive disorder, recurrent, mild: Secondary | ICD-10-CM

## 2022-07-07 DIAGNOSIS — F902 Attention-deficit hyperactivity disorder, combined type: Secondary | ICD-10-CM | POA: Diagnosis not present

## 2022-07-07 DIAGNOSIS — Z630 Problems in relationship with spouse or partner: Secondary | ICD-10-CM

## 2022-07-07 NOTE — Progress Notes (Signed)
[facility] Counselor/Therapist Progress Note  Patient ID: Jonathan [name], MRN: [id number],    Date:07/07/2022  Time Spent: 60 minutes  Treatment Type: Individual Therapy  Reported Symptoms: sadness, inability to pay attention  Mental Status Exam: Appearance:  Casual     Behavior: Appropriate  Motor: Normal  Speech/Language:  Normal Rate  Affect: Appropriate  Mood: normal  Thought process: normal  Thought content:   WNL  Sensory/Perceptual disturbances:   WNL  Orientation: oriented to person, place, time/date, and situation  Attention: Good  Concentration: Good  Memory: WNL  Fund of knowledge:  Good  Insight:   Good  Judgment:  Good  Impulse Control: Good   Risk Assessment: Danger to Self:  No Self-injurious Behavior: No Danger to Others: No Duty to Warn:no Physical Aggression / Violence:No  Access to Firearms a concern: No  Gang Involvement:No   Subjective: The patient attended a face-to-face individual therapy session in the office today.  The patient presents as pleasant but anxious.  The patient states that he is concerned about his wife.  We talked about what is happening and his wife suffers from depression.  He states that he is angry but not at her.   We processed what he feels is happening  and talked about the need for him to detach a little so that she has the space to work through her own issues.  Talked about his need to fix things and discussed the possibility that they are having some issues with enmeshment.  Recommended that he consider looking at the book Codependent No more.  Patient feels that we have hit son something and he is going to work on trying to detach a little so that the situation can work out. Interventions: Cognitive Behavioral Therapy and Insight-Oriented  Diagnosis:Mild episode of recurrent major depressive disorder ([place])  Attention deficit hyperactivity disorder (ADHD), combined type  Marital problems  Plan: Client  Abilities/Strengths  Intelligent, insightful, motivated  Client Treatment Preferences  Outpatient individual therapy  Client Statement of Needs  "I decided I needed therapy to help me sort through a situation I am dealing with"  Treatment Level  Outpatient Individual therapy  Symptoms  Increased defensiveness with his wife: (Status: improved). Passive -  aggressive behaviors: (Status: improved).  Problems Addressed  adjustment disorder, Vocational Stress, Vocational Stress, Vocational Stress, Vocational Stress,  Vocational Stress  Goals 1. Decrease incidents of exhibiting passive aggressive behavior Objective Learn and implement problem-solving skills. Target Date: 2022-09-15 Frequency: Monthly Progress: 90 Modality: individual Related Interventions 1. Conduct Problem-Solving Therapy (see Problem-Solving Therapy by Shawnee Knapp and Delane Ginger) using techniques such as psychoeducation, modeling, and role-playing to teach the client problem solving skills (i.e., defining a problem specifically, generating possible solutions, evaluating the  pros and cons of each solution, selecting and implementing a plan of action, evaluating the  efficacy of the plan, accepting or revising the plan); role-play application of the problemsolving skill to a real life issue (or assign "Applying Problem-Solving to Interpersonal Conflict" in the Adult Psychotherapy Homework Planner by Bryn Gulling). 2. Improve satisfaction and comfort surrounding coworker relationships. 3. Increase job satisfaction and performance due to implementation of  assertiveness and stress management strategies. 4. Increase sense of confidence and competence in dealing with work  responsibilities. Objective Verbalize healthy, realistic cognitive messages that promote harmony with others, self-acceptance, and  self-confidence. Target Date: 2022-09-15 Frequency: Monthly Progress: 90 Modality: individual 5. Increase sense of self-esteem and  elevation of mood in spite of  unemployment. 6. Pursue employment  consistency with a reasonably hopeful and positive attitude. Diagnosis Adult ADD 296.31 (Major depressive affective disorder, recurrent episode, mild) - 309.28 (Adjustment disorder with mixed anxiety and depressed mood) - Conditions For Discharge Achievement of treatment goals and objectives  The patient approved this plan and is making progress.  Grabiel Schmutz G Aldridge Krzyzanowski, LCSW                  Daiveon Markman G Emsley Custer, LCSW               Audri Kozub G Margaux Engen, LCSW               Chaniya Genter G Selyna Klahn, LCSW               Jenasia Dolinar G Takota Cahalan, LCSW               Bush Murdoch G Miryam Mcelhinney, LCSW               Darchelle Nunes G Tonnette Zwiebel, LCSW               Kaede Clendenen G Finnean Cerami, LCSW               Jacorie Ernsberger G Mancel Lardizabal, LCSW               Londan Coplen G Emmauel Hallums, LCSW

## 2022-07-11 ENCOUNTER — Telehealth (INDEPENDENT_AMBULATORY_CARE_PROVIDER_SITE_OTHER): Payer: BC Managed Care – PPO | Admitting: Internal Medicine

## 2022-07-11 ENCOUNTER — Encounter: Payer: Self-pay | Admitting: Internal Medicine

## 2022-07-11 DIAGNOSIS — E785 Hyperlipidemia, unspecified: Secondary | ICD-10-CM

## 2022-07-11 DIAGNOSIS — I10 Essential (primary) hypertension: Secondary | ICD-10-CM

## 2022-07-11 DIAGNOSIS — Z79899 Other long term (current) drug therapy: Secondary | ICD-10-CM | POA: Diagnosis not present

## 2022-07-11 DIAGNOSIS — E119 Type 2 diabetes mellitus without complications: Secondary | ICD-10-CM | POA: Diagnosis not present

## 2022-07-11 MED ORDER — ROSUVASTATIN CALCIUM 5 MG PO TABS: ORAL_TABLET | ORAL | 3 refills | Status: DC

## 2022-07-11 NOTE — Progress Notes (Signed)
Virtual Visit via Video Note  I connected with Jonathan [name] on 07/11/22 at 11:00 AM EDT by a video enabled telemedicine application and verified that I am speaking with the correct person using two identifiers. Location patient: home Location provider:workoffice Persons participating in the virtual visit: patient, provider  Patient aware  of the limitations of evaluation and management by telemedicine and  availability of in person appointments. and agreed to proceed.   HPI: Jonathan [name] presents for video visit  Fu dm w HT and meds   Last visit 5 2023 . Doing better   exercise program just began has helped am bg  down to 132 from 160 when not doing  frequently misses mid day metformin dose  HLD is up to 3 x per day recently  no concerns    Agrees to try increasing the metformin to regular 500 twice daily and eventually 1500 mg a day as a trial as opposed to adding other medication. Is willing to try low-dose statin after discussion begin Crestor 5 mg a week x2 then increase to 3 times a week Plan follow-up lipid panel and A1c in 3 months then visit virtual or in person Realizing that the goal would be LDL below 100 or even to 70.  We will go slow because of his reluctance to start another statin.  Had side effects of simvastatin ROS: See pertinent positives and negatives per HPI.  Past Medical History:  Diagnosis Date   Allergic rhinitis    Allergy    Anxiety    Depression    Diabetes mellitus without complication ([place])    type 2   Heart murmur    per pt told it was functional   History of kidney stones    History of nephrolithiasis    Hyperlipidemia    Hypertension    Sebaceous cyst     Past Surgical History:  Procedure Laterality Date   COLONOSCOPY  07/2010   Jonathan [name]   CYSTOSCOPY/URETEROSCOPY/HOLMIUM LASER/STENT PLACEMENT Left 01/15/2020   Procedure: CYSTOSCOPY/URETEROSCOPY/HOLMIUM LASER/STENT PLACEMENT;  Surgeon: Jonathan Mons, MD;  Location:  [facility];  Service: Urology;  Laterality: Left;   kidney stones removed  cystoscopy   x 2   TONSILLECTOMY     x 2   TYMPANOSTOMY TUBE PLACEMENT  as child    Family History  Problem Relation Age of Onset   Diabetes Father    Hypertension Father    Kidney cancer Father        now on dialysis   Kidney Stones Father    Kidney Stones Brother    Huntington's disease Maternal Grandfather    Depression Daughter    Anxiety disorder Daughter    ADD / ADHD Daughter    Colon polyps Daughter 68   Colon cancer Neg Hx    Esophageal cancer Neg Hx    Stomach cancer Neg Hx    Rectal cancer Neg Hx     Social History   Tobacco Use   Smoking status: Never   Smokeless tobacco: Never  Vaping Use   Vaping Use: Never used  Substance Use Topics   Alcohol use: Yes    Comment: less than 1 a week   Drug use: No      Current Outpatient Medications:    B Complex Vitamins (VITAMIN B COMPLEX PO), Take by mouth., Disp: , Rfl:    fluticasone (FLONASE) 50 MCG/ACT nasal spray, Place 1 spray into both nostrils daily. Begin by using 2  sprays in each nare daily for 3 to 5 days, then decrease to 1 spray in each nare daily., Disp: 32 mL, Rfl: 1   losartan-hydrochlorothiazide (HYZAAR) 50-12.5 MG tablet, TAKE 2 TABLETS DAILY, Disp: 180 tablet, Rfl: 3   metFORMIN (GLUCOPHAGE-XR) 500 MG 24 hr tablet, Take by mouth  3 per day as directed ( total '1500mg'$ ), Disp: 270 tablet, Rfl: `   Omega-3 Fatty Acids (FISH OIL PO), Take by mouth., Disp: , Rfl:    Probiotic Product (PROBIOTIC PO), Take 1 tablet by mouth every other day., Disp: , Rfl:    Vilazodone HCl 20 MG TABS, Take 1 tablet (20 mg total) by mouth daily with breakfast., Disp: 30 tablet, Rfl: 1   VITAMIN A PO, Take by mouth., Disp: , Rfl:    albuterol (VENTOLIN HFA) 108 (90 Base) MCG/ACT inhaler, Inhale 2 puffs into the lungs every 6 (six) hours as needed for wheezing or shortness of breath. (Patient not taking: Reported on 07/11/2022), Disp: 18  g, Rfl: 1   ALPRAZolam (XANAX) 0.5 MG tablet, Take 1/2-1 tab as needed every 4-6 hours as needed for severe anxiety and panic (Patient not taking: Reported on 07/11/2022), Disp: 30 tablet, Rfl: 0   fexofenadine (ALLEGRA) 180 MG tablet, Take 1 tablet (180 mg total) by mouth daily. (Patient not taking: Reported on 07/11/2022), Disp: 90 tablet, Rfl: 1   guaifenesin (HUMIBID E) 400 MG TABS tablet, Take 1 tablet 3 times daily as needed for chest congestion and cough (Patient not taking: Reported on 07/11/2022), Disp: 21 tablet, Rfl: 0   ipratropium (ATROVENT) 0.06 % nasal spray, Place 2 sprays into both nostrils 3 (three) times daily. As needed for nasal congestion, runny nose (Patient not taking: Reported on 07/11/2022), Disp: 15 mL, Rfl: 1   rosuvastatin (CRESTOR) 5 MG tablet, Take 5 mg  by mouth 5 days per week for 2 weeks  and then increase to  5 mg 7 days per  week as tolerated, Disp: 90 tablet, Rfl: 3  EXAM: BP Readings from Last 3 Encounters:  04/04/22 140/72  03/23/22 (!) 145/84  03/21/22 (!) 159/82    VITALS per patient if applicable:  GENERAL: alert, oriented, appears well and in no acute distress  HEENT: atraumatic, conjunttiva clear, no obvious abnormalities on inspection of external nose and ears  NECK: normal movements of the head and neck  LUNGS: on inspection no signs of respiratory distress, breathing rate appears normal, no obvious gross SOB, gasping or wheezing  CV: no obvious cyanosis  MS: moves all visible extremities without noticeable abnormality  PSYCH/NEURO: pleasant and cooperative, no obvious depression or anxiety, speech and thought processing grossly intact Lab Results  Component Value Date   WBC 9.6 03/30/2022   HGB 16.3 03/30/2022   HCT 48.4 03/30/2022   PLT 358.0 03/30/2022   GLUCOSE 141 (H) 03/30/2022   CHOL 223 (H) 07/05/2022   TRIG 150.0 (H) 07/05/2022   HDL 49.20 07/05/2022   LDLDIRECT 209.0 03/30/2022   LDLCALC 144 (H) 07/05/2022   ALT 27 03/30/2022    AST 18 03/30/2022   NA 141 03/30/2022   K 4.7 03/30/2022   CL 101 03/30/2022   CREATININE 1.22 03/30/2022   BUN 16 03/30/2022   CO2 30 03/30/2022   TSH 0.79 03/30/2022   PSA 1.17 03/30/2022   HGBA1C 7.0 (H) 07/05/2022   MICROALBUR 0.8 10/19/2020    ASSESSMENT AND PLAN:  Discussed the following assessment and plan:    ICD-10-CM   1. Diabetes mellitus with  coincident hypertension (HCC)  E11.9 Hemoglobin A1c   I10 Lipid panel    Microalbumin / creatinine urine ratio   down to 7 a1c  adding exercise  working on 500 tid metformin compliance can always add medication wants to continue with current plan    2. Medication management  Z79.899 Hemoglobin A1c    Lipid panel    Microalbumin / creatinine urine ratio    3. Hyperlipidemia, unspecified hyperlipidemia type  E78.5 Hemoglobin A1c    Lipid panel    Microalbumin / creatinine urine ratio   improved  ramping up advance to 5 mg d as tolerated    4. Essential hypertension  I10 Hemoglobin A1c    Lipid panel    Microalbumin / creatinine urine ratio   reported controlled      A1c down to 7 from 7.6  Ldl 144 down from   tc 223 down from 279 Counseled.  Work on  getting full dose 1500 mg per day of metformin ( cna try 1000 at one time)  Inc rosuvastatin to 5 d per week and then 7 days per week.  Cont fit after fifty  and monitor  Plan lab and fu visit virtual ok in about 4 months   Expectant management and discussion of plan and treatment with opportunity to ask questions and all were answered. The patient agreed with the plan and demonstrated an understanding of the instructions.   Advised to call back or seek an in-person evaluation if worsening  or having  further concerns  in interim. Return for lab 4 mos and dthen ov or virtual  lipids a1c and urine  microalbumin.    Shanon Ace, MD

## 2022-07-19 ENCOUNTER — Other Ambulatory Visit: Payer: Self-pay | Admitting: Psychiatry

## 2022-07-19 ENCOUNTER — Ambulatory Visit: Payer: BC Managed Care – PPO | Admitting: Psychiatry

## 2022-07-19 ENCOUNTER — Encounter: Payer: Self-pay | Admitting: Psychiatry

## 2022-07-19 DIAGNOSIS — F330 Major depressive disorder, recurrent, mild: Secondary | ICD-10-CM | POA: Diagnosis not present

## 2022-07-19 DIAGNOSIS — F410 Panic disorder [episodic paroxysmal anxiety] without agoraphobia: Secondary | ICD-10-CM

## 2022-07-19 MED ORDER — VILAZODONE HCL 20 MG PO TABS
ORAL_TABLET | ORAL | 1 refills | Status: DC
Start: 2022-07-19 — End: 2022-08-03

## 2022-07-19 NOTE — Progress Notes (Signed)
Jonathan [name] [id number] 09-Jun-1961 61 y.o.  Subjective:   Patient ID:  Jonathan [name] is a 61 y.o. (DOB 29-Aug-1961) male.  Chief Complaint:  Chief Complaint  Patient presents with   Depression    HPI Jonathan [name] presents to the office today for follow-up of depression, anxiety, and ADHD. "I feel it's really close."  He feels that Viibryd "burns out towards the end of the day... I'm feeling kind of a drop." He notices a drop in mood about 3-4 pm. He notices some irritability later in the day and feeling "too much on me... not as bad as it was." Denies anxiety or panic attacks. He reports energy and motivaiton has improved and is eager to get to the office and take care of tasks. He reports that concentration is "not as much as I would like." He has had difficulty starting several novels. He has not read for pleasure recently. He is able to accomplish things at work. Sleeping "great." He reports that his appetite seems "usual or less." Denies anhedonia. Denies SI.   He has been experiencing diarrhea since increase in dose and that it is improving some over the last 5-7 days. He denies frequent episodes of diarrhea. He is taking Viibryd with food. He reports that he has also been taking Metformin at lunch more consistently.   He has been trying to exercise more and notices some improvement in mood, energy, and glucose levels.   He has been supporting wife with some mental health challenges she is experiencing.   Past Psychiatric Medication Trials: Citalopram- Sexual side effects, fatigue. Partially effective for depression. Increased to 15 mg in the last month and noticed more fatigue. He reports that he has been taking 10 mg most of the time since then.  Trintellix- Improved mood, anxiety, and focus on 5 mg. Excessive somnolence on 10 mg. Pristiq Wellbutrin- Took 25 years ago. Cannot recall response. Concerta Modafinil-Helpful for concentration. Caused some edginess  PHQ2-9     Flowsheet Row Video Visit from 07/11/2022 in [place] at [facility] from 05/05/2021 in [place] at [facility] from 10/19/2020 in [place] at [facility] from 08/02/2019 in [place] at [facility] from 10/01/2018 in [place] at [facility] Total Score 3 2 0 4 0  PHQ-9 Total Score '12 12 9 15 '$ --      Flowsheet Row ED from 03/21/2022 in [facility] [place] [facility] from 03/17/2022 in [facility] [place] [facility] from 07/16/2021 in [facility] [place] No Risk No Risk Error: Question 6 not populated        Review of Systems:  Review of Systems  Gastrointestinal:  Positive for diarrhea.  Musculoskeletal:  Negative for gait problem.       Achilles tendon pain  Psychiatric/Behavioral:         Please refer to HPI    Medications: I have reviewed the patient's current medications.  Current Outpatient Medications  Medication Sig Dispense Refill   B Complex Vitamins (VITAMIN B COMPLEX PO) Take by mouth.     fluticasone (FLONASE) 50 MCG/ACT nasal spray Place 1 spray into both nostrils daily. Begin by using 2 sprays in each nare daily for 3 to 5 days, then decrease to 1 spray in each nare daily. 32 mL 1   losartan-hydrochlorothiazide (HYZAAR) 50-12.5 MG tablet TAKE 2 TABLETS DAILY 180 tablet 3  metFORMIN (GLUCOPHAGE-XR) 500 MG 24 hr tablet Take by mouth  3 per day as directed ( total '1500mg'$ ) 270 tablet `   Omega-3 Fatty Acids (FISH OIL PO) Take by mouth.     Probiotic Product (PROBIOTIC PO) Take 1 tablet by mouth every other day.     rosuvastatin (CRESTOR) 5 MG tablet Take 5 mg  by mouth 5 days per week for 2 weeks  and then increase to  5 mg 7 days per  week as tolerated 90 tablet 3   VITAMIN A PO Take by mouth.     albuterol (VENTOLIN HFA) 108 (90 Base) MCG/ACT inhaler Inhale 2 puffs into the lungs every 6 (six)  hours as needed for wheezing or shortness of breath. (Patient not taking: Reported on 07/11/2022) 18 g 1   ALPRAZolam (XANAX) 0.5 MG tablet Take 1/2-1 tab as needed every 4-6 hours as needed for severe anxiety and panic (Patient not taking: Reported on 07/11/2022) 30 tablet 0   fexofenadine (ALLEGRA) 180 MG tablet Take 1 tablet (180 mg total) by mouth daily. (Patient not taking: Reported on 07/11/2022) 90 tablet 1   guaifenesin (HUMIBID E) 400 MG TABS tablet Take 1 tablet 3 times daily as needed for chest congestion and cough (Patient not taking: Reported on 07/11/2022) 21 tablet 0   ipratropium (ATROVENT) 0.06 % nasal spray Place 2 sprays into both nostrils 3 (three) times daily. As needed for nasal congestion, runny nose (Patient not taking: Reported on 07/11/2022) 15 mL 1   Vilazodone HCl 20 MG TABS Take 1 tablet (20 mg total) by mouth daily with breakfast AND 0.5 tablets (10 mg total) daily with lunch. 45 tablet 1   No current facility-administered medications for this visit.    Medication Side Effects: Other: Diarrhea  Allergies:  Allergies  Allergen Reactions   Statins Other (See Comments)    myalgias     Zocor [Simvastatin]      myalgias   Penicillins Hives    Per pt - happened in childhood - hives possible reaction    Past Medical History:  Diagnosis Date   Allergic rhinitis    Allergy    Anxiety    Depression    Diabetes mellitus without complication (HCC)    type 2   Heart murmur    per pt told it was functional   History of kidney stones    History of nephrolithiasis    Hyperlipidemia    Hypertension    Sebaceous cyst     Past Medical History, Surgical history, Social history, and Family history were reviewed and updated as appropriate.   Please see review of systems for further details on the patient's review from today.   Objective:   Physical Exam:  There were no vitals taken for this visit.  Physical Exam Constitutional:      General: He is not in acute  distress. Musculoskeletal:        General: No deformity.  Neurological:     Mental Status: He is alert and oriented to person, place, and time.     Coordination: Coordination normal.  Psychiatric:        Attention and Perception: Attention and perception normal. He does not perceive auditory or visual hallucinations.        Mood and Affect: Mood is not anxious. Affect is not labile, blunt, angry or inappropriate.        Speech: Speech normal.        Behavior: Behavior normal.  Thought Content: Thought content normal. Thought content is not paranoid or delusional. Thought content does not include homicidal or suicidal ideation. Thought content does not include homicidal or suicidal plan.        Cognition and Memory: Cognition and memory normal.        Judgment: Judgment normal.     Comments: Insight intact Mood is mildly depressed     Lab Review:     Component Value Date/Time   NA 141 03/30/2022 0747   NA 141 08/17/2021 0000   K 4.7 03/30/2022 0747   CL 101 03/30/2022 0747   CO2 30 03/30/2022 0747   GLUCOSE 141 (H) 03/30/2022 0747   BUN 16 03/30/2022 0747   CREATININE 1.22 03/30/2022 0747   CREATININE 1.15 10/19/2020 1206   CALCIUM 9.3 03/30/2022 0747   PROT 6.5 03/30/2022 0747   ALBUMIN 4.1 03/30/2022 0747   AST 18 03/30/2022 0747   ALT 27 03/30/2022 0747   ALKPHOS 65 03/30/2022 0747   BILITOT 0.7 03/30/2022 0747   GFRNONAA 72.1 08/17/2021 0000   GFRNONAA 68 10/19/2020 1206   GFRAA 83.5 08/17/2021 0000   GFRAA 79 10/19/2020 1206       Component Value Date/Time   WBC 9.6 03/30/2022 0747   RBC 5.22 03/30/2022 0747   HGB 16.3 03/30/2022 0747   HCT 48.4 03/30/2022 0747   PLT 358.0 03/30/2022 0747   MCV 92.8 03/30/2022 0747   MCV 94.2 04/29/2013 0936   MCH 30.9 10/19/2020 1206   MCHC 33.7 03/30/2022 0747   RDW 13.3 03/30/2022 0747   LYMPHSABS 2.3 03/30/2022 0747   MONOABS 0.9 03/30/2022 0747   EOSABS 0.2 03/30/2022 0747   BASOSABS 0.1 03/30/2022 0747     No results found for: "POCLITH", "LITHIUM"   No results found for: "PHENYTOIN", "PHENOBARB", "VALPROATE", "CBMZ"   .res Assessment: Plan:    Patient seen for 30 minutes and time spent discussing response to increase in Viibryd and implications for further treatment.  He reports an overall improvement in mood and anxiety since increasing Viibryd to 20 mg daily, however he notices that his mood and energy wane by late afternoon.  Recommend increase in Viibryd to total daily dose of 30 mg.  Recommend taking 20 mg in the morning and one half of a 20 mg tablet at lunchtime to possibly prevent waning mood and energy in the afternoon.  Discussed that taking Viibryd close to bedtime could result in possible sleep disturbance and therefore would recommend taking half tab in the middle of the day. He continues to have Xanax available in case of panic, however he has not needed this recently and does not need a refill at this time. Recommend continuing therapy with Bambi Cottle, LCSW.  Pt to follow-up with this provider in 4 weeks or sooner if clinically indicated.  Patient advised to contact office with any questions, adverse effects, or acute worsening in signs and symptoms.   Zarius was seen today for depression.  Diagnoses and all orders for this visit:  Mild episode of recurrent major depressive disorder (HCC) -     Vilazodone HCl 20 MG TABS; Take 1 tablet (20 mg total) by mouth daily with breakfast AND 0.5 tablets (10 mg total) daily with lunch.  Panic -     Vilazodone HCl 20 MG TABS; Take 1 tablet (20 mg total) by mouth daily with breakfast AND 0.5 tablets (10 mg total) daily with lunch.     Please see After Visit Summary for patient specific  instructions.  Future Appointments  Date Time Provider Vincent  08/11/2022  8:00 AM Cottle, Lucious Groves, LCSW [facility] None  08/16/2022  9:30 AM Thayer Headings, PMHNP [facility] None  08/23/2022  8:00 AM Cottle, Bambi G, LCSW [facility] None   09/13/2022  8:00 AM Cottle, Bambi G, LCSW [facility] None  10/06/2022  8:00 AM Cottle, Bambi G, LCSW [facility] None    No orders of the defined types were placed in this encounter.   -------------------------------

## 2022-07-19 NOTE — Telephone Encounter (Signed)
Please see pharmacy note.  

## 2022-07-20 NOTE — Telephone Encounter (Signed)
Would you please find out if there is an option to appeal this or if it needs a PA?

## 2022-07-21 ENCOUNTER — Ambulatory Visit (INDEPENDENT_AMBULATORY_CARE_PROVIDER_SITE_OTHER): Payer: BC Managed Care – PPO | Admitting: Psychology

## 2022-07-21 DIAGNOSIS — F330 Major depressive disorder, recurrent, mild: Secondary | ICD-10-CM | POA: Diagnosis not present

## 2022-07-21 DIAGNOSIS — Z630 Problems in relationship with spouse or partner: Secondary | ICD-10-CM | POA: Diagnosis not present

## 2022-07-21 NOTE — Telephone Encounter (Signed)
Tried to submit PA and it says clinical over ride not needed. I will contact his pharmacy and go from there.

## 2022-07-21 NOTE — Progress Notes (Signed)
[facility] Counselor/Therapist Progress Note  Patient ID: Jonathan [name], MRN: [id number],    Date:07/20/2022  Time Spent: 60 minutes  Treatment Type: Individual Therapy  Reported Symptoms: sadness, inability to pay attention  Mental Status Exam: Appearance:  Casual     Behavior: Appropriate  Motor: Normal  Speech/Language:  Normal Rate  Affect: Appropriate  Mood: normal  Thought process: normal  Thought content:   WNL  Sensory/Perceptual disturbances:   WNL  Orientation: oriented to person, place, time/date, and situation  Attention: Good  Concentration: Good  Memory: WNL  Fund of knowledge:  Good  Insight:   Good  Judgment:  Good  Impulse Control: Good   Risk Assessment: Danger to Self:  No Self-injurious Behavior: No Danger to Others: No Duty to Warn:no Physical Aggression / Violence:No  Access to Firearms a concern: No  Gang Involvement:No   Subjective: The patient attended a face-to-face individual therapy session in the office today.  The patient presents as pleasant and cooperative.  The patient reports that he has not been exercising the last couple of weeks because he hurt his Achilles tendon and also he was having some pain in his knees.  He realizes that he needs to continue to do the exercise but does have it in his calendar so that he can continue to do it as needed.  The patient talked about going to [facility] the end of this month and is excited about doing that.  We talked again about him taking care of himself and doing what he needs to do in order to remain compassionate with his wife but not overstep his boundaries.  The patient is more aware and seems to do be doing better with this.   Interventions: Cognitive Behavioral Therapy and Insight-Oriented  Diagnosis:Mild episode of recurrent major depressive disorder ([place])  Marital problems  Plan: Client Abilities/Strengths  Intelligent, insightful, motivated  Client Treatment  Preferences  Outpatient individual therapy  Client Statement of Needs  "I decided I needed therapy to help me sort through a situation I am dealing with"  Treatment Level  Outpatient Individual therapy  Symptoms  Increased defensiveness with his wife: (Status: improved). Passive -  aggressive behaviors: (Status: improved).  Problems Addressed  adjustment disorder, Vocational Stress, Vocational Stress, Vocational Stress, Vocational Stress,  Vocational Stress  Goals 1. Decrease incidents of exhibiting passive aggressive behavior Objective Learn and implement problem-solving skills. Target Date: 2022-09-15 Frequency: Monthly Progress: 90 Modality: individual Related Interventions 1. Conduct Problem-Solving Therapy (see Problem-Solving Therapy by Shawnee Knapp and Delane Ginger) using techniques such as psychoeducation, modeling, and role-playing to teach the client problem solving skills (i.e., defining a problem specifically, generating possible solutions, evaluating the  pros and cons of each solution, selecting and implementing a plan of action, evaluating the  efficacy of the plan, accepting or revising the plan); role-play application of the problemsolving skill to a real life issue (or assign "Applying Problem-Solving to Interpersonal Conflict" in the Adult Psychotherapy Homework Planner by Bryn Gulling). 2. Improve satisfaction and comfort surrounding coworker relationships. 3. Increase job satisfaction and performance due to implementation of  assertiveness and stress management strategies. 4. Increase sense of confidence and competence in dealing with work  responsibilities. Objective Verbalize healthy, realistic cognitive messages that promote harmony with others, self-acceptance, and  self-confidence. Target Date: 2022-09-15 Frequency: Monthly Progress: 90 Modality: individual 5. Increase sense of self-esteem and elevation of mood in spite of  unemployment. 6. Pursue employment consistency  with a reasonably hopeful and positive attitude. Diagnosis Adult  ADD 296.31 (Major depressive affective disorder, recurrent episode, mild) - 309.28 (Adjustment disorder with mixed anxiety and depressed mood) - Conditions For Discharge Achievement of treatment goals and objectives  The patient approved this plan and is making progress.  Trellis Vanoverbeke G Seham Gardenhire, LCSW                  Kenlie Seki G Keagan Anthis, LCSW               Andre Gallego G Tyshon Fanning, LCSW               Nickola Lenig G Demetris Capell, LCSW               Loron Weimer G Neiko Trivedi, LCSW               Deavon Podgorski G Dontaye Hur, LCSW               Shamus Desantis G Sherard Sutch, LCSW               Datrell Dunton G Keiton Cosma, LCSW               Ennifer Harston G Romina Divirgilio, LCSW               Ciarah Peace G Shawnise Peterkin, LCSW               Sulema Braid G Makahla Kiser, LCSW

## 2022-07-21 NOTE — Telephone Encounter (Signed)
I will submit a PA

## 2022-07-31 ENCOUNTER — Other Ambulatory Visit: Payer: Self-pay | Admitting: Psychiatry

## 2022-07-31 DIAGNOSIS — F330 Major depressive disorder, recurrent, mild: Secondary | ICD-10-CM

## 2022-07-31 DIAGNOSIS — F410 Panic disorder [episodic paroxysmal anxiety] without agoraphobia: Secondary | ICD-10-CM

## 2022-08-01 ENCOUNTER — Telehealth: Payer: Self-pay | Admitting: Psychiatry

## 2022-08-01 NOTE — Telephone Encounter (Signed)
See message. I checked CMM and it says clinical override not needed. Called the pharmacy and they say insurance will only cover 1 pill a day and he is on 1.5.

## 2022-08-01 NOTE — Telephone Encounter (Signed)
Next appt is 08/16/22. Jonathan [name] needs a refill on his Villazadone HCL 20 mg but [facility] on [address] states the max quantity they can fill is #30 pills and not #45. Pharmacy is:  [id number] [address] Phone:  [phone] Fax:  [phone]

## 2022-08-01 NOTE — Telephone Encounter (Signed)
Noted I will contact [facility]

## 2022-08-03 NOTE — Telephone Encounter (Signed)
Update on his Vilazodone 20 mg 1.5 tablets daily, completed quantity override with [facility] but also has to be written for a 90 day. Submitted [facility] day Rx to [facility].  Prior approval for quantity effective 07/04/2022-08/03/2023, #135/90 day PA# [id number]

## 2022-08-03 NOTE — Telephone Encounter (Signed)
This is from 8/27. I know you were working on this. Medication approved but not quantity.

## 2022-08-11 ENCOUNTER — Ambulatory Visit: Payer: BC Managed Care – PPO | Admitting: Psychology

## 2022-08-11 DIAGNOSIS — Z630 Problems in relationship with spouse or partner: Secondary | ICD-10-CM | POA: Diagnosis not present

## 2022-08-11 DIAGNOSIS — F330 Major depressive disorder, recurrent, mild: Secondary | ICD-10-CM

## 2022-08-11 DIAGNOSIS — F902 Attention-deficit hyperactivity disorder, combined type: Secondary | ICD-10-CM | POA: Diagnosis not present

## 2022-08-12 NOTE — Progress Notes (Signed)
[facility] Counselor/Therapist Progress Note  Patient ID: Jonathan [name], MRN: [id number],    Date:08/11/2022  Time Spent: 60 minutes  Treatment Type: Individual Therapy  Reported Symptoms: sadness, inability to pay attention  Mental Status Exam: Appearance:  Casual     Behavior: Appropriate  Motor: Normal  Speech/Language:  Normal Rate  Affect: Appropriate  Mood: normal  Thought process: normal  Thought content:   WNL  Sensory/Perceptual disturbances:   WNL  Orientation: oriented to person, place, time/date, and situation  Attention: Good  Concentration: Good  Memory: WNL  Fund of knowledge:  Good  Insight:   Good  Judgment:  Good  Impulse Control: Good   Risk Assessment: Danger to Self:  No Self-injurious Behavior: No Danger to Others: No Duty to Warn:no Physical Aggression / Violence:No  Access to Firearms a concern: No  Gang Involvement:No   Subjective: The patient attended a face-to-face individual therapy session in the office today.  The patient presents as pleasant and cooperative.  The patient reports that he went to [facility] and did some CEU time for his job.  The patient reports that he had a lovely time.  He states that his wife is still struggling and he is trying to allow her to work through her own issues but sometimes has difficulty.  We talked about neurologically what is happening in her brain and how sometimes depression or anxiety can become part of someone's identity.  The patient reports that it is helpful to understand this concept.  I continue to recommend that he not offer advice and that he just take care of himself.  We talked about him continuing his self-care activities and being supportive but not trying to fix the situation.   Interventions: Cognitive Behavioral Therapy and Insight-Oriented  Diagnosis:Mild episode of recurrent major depressive disorder ([place])  Marital problems  Attention deficit hyperactivity  disorder (ADHD), combined type  Plan: Client Abilities/Strengths  Intelligent, insightful, motivated  Client Treatment Preferences  Outpatient individual therapy  Client Statement of Needs  "I decided I needed therapy to help me sort through a situation I am dealing with"  Treatment Level  Outpatient Individual therapy  Symptoms  Increased defensiveness with his wife: (Status: improved). Passive -  aggressive behaviors: (Status: improved).  Problems Addressed  adjustment disorder, Vocational Stress, Vocational Stress, Vocational Stress, Vocational Stress,  Vocational Stress  Goals 1. Decrease incidents of exhibiting passive aggressive behavior Objective Learn and implement problem-solving skills. Target Date: 2022-09-15 Frequency: Monthly Progress: 90 Modality: individual Related Interventions 1. Conduct Problem-Solving Therapy (see Problem-Solving Therapy by Shawnee Knapp and Delane Ginger) using techniques such as psychoeducation, modeling, and role-playing to teach the client problem solving skills (i.e., defining a problem specifically, generating possible solutions, evaluating the  pros and cons of each solution, selecting and implementing a plan of action, evaluating the  efficacy of the plan, accepting or revising the plan); role-play application of the problemsolving skill to a real life issue (or assign "Applying Problem-Solving to Interpersonal Conflict" in the Adult Psychotherapy Homework Planner by Bryn Gulling). 2. Improve satisfaction and comfort surrounding coworker relationships. 3. Increase job satisfaction and performance due to implementation of  assertiveness and stress management strategies. 4. Increase sense of confidence and competence in dealing with work  responsibilities. Objective Verbalize healthy, realistic cognitive messages that promote harmony with others, self-acceptance, and  self-confidence. Target Date: 2022-09-15 Frequency: Monthly Progress: 90 Modality:  individual 5. Increase sense of self-esteem and elevation of mood in spite of  unemployment. 6. Pursue employment  consistency with a reasonably hopeful and positive attitude. Diagnosis Adult ADD 296.31 (Major depressive affective disorder, recurrent episode, mild) - 309.28 (Adjustment disorder with mixed anxiety and depressed mood) - Conditions For Discharge Achievement of treatment goals and objectives  The patient approved this plan and is making progress.  Cathaleen Korol G Deadra Diggins, LCSW                                                                                                                                                                       Khaliya Golinski G Jkai Arwood, LCSW

## 2022-08-16 ENCOUNTER — Ambulatory Visit: Payer: BC Managed Care – PPO | Admitting: Psychiatry

## 2022-08-16 ENCOUNTER — Encounter: Payer: Self-pay | Admitting: Psychiatry

## 2022-08-16 DIAGNOSIS — F3341 Major depressive disorder, recurrent, in partial remission: Secondary | ICD-10-CM | POA: Diagnosis not present

## 2022-08-16 DIAGNOSIS — F902 Attention-deficit hyperactivity disorder, combined type: Secondary | ICD-10-CM

## 2022-08-16 NOTE — Progress Notes (Signed)
Jonathan [name] [id number] November 14, 1961 61 y.o.  Subjective:   Patient ID:  Jonathan [name] is a 61 y.o. (DOB 03-Dec-1961) male.  Chief Complaint:  Chief Complaint  Patient presents with   Follow-up    Anxiety, depression    HPI Jonathan [name] presents to the office today for follow-up of anxiety and depression.He reports that he has been doing well- "I think we've found the sweet spot." He reports "I feel a calm" after taking mid-day. "I feel even across the day." Mood has been improved. Denies irritability. He reports some possible mild depression "but I don't feel like it is pulling me down as much." Denies insomnia. He reports that he feels like he is sleeping better. Appetite has been low. He reports that he is not as hungry throughout the day. Energy and motivation have been "really good." Concentration is fair. He reports that he is able to do what he needs to do. Has not been able to pick up a book and get pulled in. "This is the best I have felt" in years. Denies anhedonia. Denies SI.   Has been doing circuit training workouts in the morning. Playing disc golf.   He reports that his wife has been experiencing depression.   He has not needed Alprazolam prn recently.   Past Psychiatric Medication Trials: Citalopram- Sexual side effects, fatigue. Partially effective for depression. Increased to 15 mg in the last month and noticed more fatigue. He reports that he has been taking 10 mg most of the time since then.  Trintellix- Improved mood, anxiety, and focus on 5 mg. Excessive somnolence on 10 mg. Pristiq Wellbutrin- Took 25 years ago. Cannot recall response. Concerta Modafinil-Helpful for concentration. Caused some edginess     PHQ2-9    Flowsheet Row Video Visit from 07/11/2022 in [place] at [facility] from 05/05/2021 in [place] at [facility] from 10/19/2020 in [place] at [place] from 08/02/2019 in  [place] at [facility] from 10/01/2018 in [place] at [facility] Total Score 3 2 0 4 0  PHQ-9 Total Score '12 12 9 15 '$ --      Flowsheet Row ED from 03/21/2022 in [facility] at [place] [facility] from 03/17/2022 in [facility] at [place] [facility] from 07/16/2021 in [facility] at [place] No Risk No Risk Error: Question 6 not populated        Review of Systems:  Review of Systems  Gastrointestinal:        Occ diarrhea  Musculoskeletal:  Negative for gait problem.       Hip and knee pain  Neurological:  Negative for tremors.  Psychiatric/Behavioral:         Please refer to HPI    Medications: I have reviewed the patient's current medications.  Current Outpatient Medications  Medication Sig Dispense Refill   fluticasone (FLONASE) 50 MCG/ACT nasal spray Place 1 spray into both nostrils daily. Begin by using 2 sprays in each nare daily for 3 to 5 days, then decrease to 1 spray in each nare daily. 32 mL 1   losartan-hydrochlorothiazide (HYZAAR) 50-12.5 MG tablet TAKE 2 TABLETS DAILY 180 tablet 3   metFORMIN (GLUCOPHAGE-XR) 500 MG 24 hr tablet Take by mouth  3 per day as directed ( total '1500mg'$ ) 270 tablet `   Omega-3 Fatty Acids (FISH OIL PO) Take by mouth.     Probiotic Product (  PROBIOTIC PO) Take 1 tablet by mouth every other day.     rosuvastatin (CRESTOR) 5 MG tablet Take 5 mg  by mouth 5 days per week for 2 weeks  and then increase to  5 mg 7 days per  week as tolerated 90 tablet 3   Vilazodone HCl 20 MG TABS TAKE 1 TABLET (20 MG TOTAL) BY MOUTH DAILY WITH BREAKFAST AND 0.5 TABLETS (10 MG TOTAL) DAILY WITH LUNCH. 135 tablet 1   albuterol (VENTOLIN HFA) 108 (90 Base) MCG/ACT inhaler Inhale 2 puffs into the lungs every 6 (six) hours as needed for wheezing or shortness of breath. (Patient not taking: Reported on 07/11/2022) 18 g 1   ALPRAZolam (XANAX) 0.5 MG tablet Take 1/2-1 tab as  needed every 4-6 hours as needed for severe anxiety and panic (Patient not taking: Reported on 07/11/2022) 30 tablet 0   B Complex Vitamins (VITAMIN B COMPLEX PO) Take by mouth.     fexofenadine (ALLEGRA) 180 MG tablet Take 1 tablet (180 mg total) by mouth daily. (Patient not taking: Reported on 07/11/2022) 90 tablet 1   guaifenesin (HUMIBID E) 400 MG TABS tablet Take 1 tablet 3 times daily as needed for chest congestion and cough (Patient not taking: Reported on 07/11/2022) 21 tablet 0   ipratropium (ATROVENT) 0.06 % nasal spray Place 2 sprays into both nostrils 3 (three) times daily. As needed for nasal congestion, runny nose (Patient not taking: Reported on 07/11/2022) 15 mL 1   No current facility-administered medications for this visit.    Medication Side Effects: Other: Occ diarrhea   Allergies:  Allergies  Allergen Reactions   Statins Other (See Comments)    myalgias     Zocor [Simvastatin]      myalgias   Penicillins Hives    Per pt - happened in childhood - hives possible reaction    Past Medical History:  Diagnosis Date   Allergic rhinitis    Allergy    Anxiety    Depression    Diabetes mellitus without complication (HCC)    type 2   Heart murmur    per pt told it was functional   History of kidney stones    History of nephrolithiasis    Hyperlipidemia    Hypertension    Sebaceous cyst     Past Medical History, Surgical history, Social history, and Family history were reviewed and updated as appropriate.   Please see review of systems for further details on the patient's review from today.   Objective:   Physical Exam:  There were no vitals taken for this visit.  Physical Exam Constitutional:      General: He is not in acute distress. Musculoskeletal:        General: No deformity.  Neurological:     Mental Status: He is alert and oriented to person, place, and time.     Coordination: Coordination normal.  Psychiatric:        Attention and Perception:  Attention and perception normal. He does not perceive auditory or visual hallucinations.        Mood and Affect: Mood normal. Mood is not anxious or depressed. Affect is not labile, blunt, angry or inappropriate.        Speech: Speech normal.        Behavior: Behavior normal.        Thought Content: Thought content normal. Thought content is not paranoid or delusional. Thought content does not include homicidal or suicidal ideation. Thought content does  not include homicidal or suicidal plan.        Cognition and Memory: Cognition and memory normal.        Judgment: Judgment normal.     Comments: Insight intact     Lab Review:     Component Value Date/Time   NA 141 03/30/2022 0747   NA 141 08/17/2021 0000   K 4.7 03/30/2022 0747   CL 101 03/30/2022 0747   CO2 30 03/30/2022 0747   GLUCOSE 141 (H) 03/30/2022 0747   BUN 16 03/30/2022 0747   CREATININE 1.22 03/30/2022 0747   CREATININE 1.15 10/19/2020 1206   CALCIUM 9.3 03/30/2022 0747   PROT 6.5 03/30/2022 0747   ALBUMIN 4.1 03/30/2022 0747   AST 18 03/30/2022 0747   ALT 27 03/30/2022 0747   ALKPHOS 65 03/30/2022 0747   BILITOT 0.7 03/30/2022 0747   GFRNONAA 72.1 08/17/2021 0000   GFRNONAA 68 10/19/2020 1206   GFRAA 83.5 08/17/2021 0000   GFRAA 79 10/19/2020 1206       Component Value Date/Time   WBC 9.6 03/30/2022 0747   RBC 5.22 03/30/2022 0747   HGB 16.3 03/30/2022 0747   HCT 48.4 03/30/2022 0747   PLT 358.0 03/30/2022 0747   MCV 92.8 03/30/2022 0747   MCV 94.2 04/29/2013 0936   MCH 30.9 10/19/2020 1206   MCHC 33.7 03/30/2022 0747   RDW 13.3 03/30/2022 0747   LYMPHSABS 2.3 03/30/2022 0747   MONOABS 0.9 03/30/2022 0747   EOSABS 0.2 03/30/2022 0747   BASOSABS 0.1 03/30/2022 0747    No results found for: "POCLITH", "LITHIUM"   No results found for: "PHENYTOIN", "PHENOBARB", "VALPROATE", "CBMZ"   .res Assessment: Plan:    Patient seen for 30 minutes and time spent discussing response to increase in Viibryd and  discussing treatment plan.  He reports overall in both mood and anxiety with increase in Viibryd.  He reports that he is continuing to experience some occasional diarrhea and at this time benefits are outweighing side effect.  He reports some possible mild residual depressive signs and symptoms that may be situational.  Discussed that GI side effects could possibly worsen with increase in Viibryd.  Patient reports that he would prefer to continue current dose of Viibryd at this time. Will continue Viibryd 20 mg daily in the morning and 10 mg daily with lunch for depression and anxiety. He has not needed alprazolam recently, however will continue alprazolam as needed in the event that he experiences a panic attack.  He reports that he does not need a refill at this time. Recommend continuing therapy with Bambi Cottle, LCSW.  Pt to follow-up in 2 months or sooner if clinically indicated.  Patient advised to contact office with any questions, adverse effects, or acute worsening in signs and symptoms.   Jonathan [name] was seen today for follow-up.  Diagnoses and all orders for this visit:  Recurrent major depressive disorder, in partial remission ([place])  Attention deficit hyperactivity disorder (ADHD), combined type     Please see After Visit Summary for patient specific instructions.  Future Appointments  Date Time Provider [place]  08/23/2022  8:00 AM Cottle, Lucious Groves, LCSW [facility] None  09/13/2022  8:00 AM Cottle, Bambi G, LCSW [facility] None  10/06/2022  8:00 AM Cottle, Bambi G, LCSW [facility] None  10/11/2022  9:00 AM Thayer Headings, PMHNP [facility] None    No orders of the defined types were placed in this encounter.   -------------------------------

## 2022-08-23 ENCOUNTER — Ambulatory Visit (INDEPENDENT_AMBULATORY_CARE_PROVIDER_SITE_OTHER): Payer: BC Managed Care – PPO | Admitting: Psychology

## 2022-08-23 DIAGNOSIS — F3341 Major depressive disorder, recurrent, in partial remission: Secondary | ICD-10-CM

## 2022-08-23 DIAGNOSIS — F902 Attention-deficit hyperactivity disorder, combined type: Secondary | ICD-10-CM | POA: Diagnosis not present

## 2022-08-24 NOTE — Progress Notes (Signed)
[facility] Counselor/Therapist Progress Note  Patient ID: Jonathan [name], MRN: [id number],    Date:08/23/2022  Time Spent: 60 minutes  Treatment Type: Individual Therapy  Reported Symptoms: sadness, inability to pay attention  Mental Status Exam: Appearance:  Casual     Behavior: Appropriate  Motor: Normal  Speech/Language:  Normal Rate  Affect: Appropriate  Mood: normal  Thought process: normal  Thought content:   WNL  Sensory/Perceptual disturbances:   WNL  Orientation: oriented to person, place, time/date, and situation  Attention: Good  Concentration: Good  Memory: WNL  Fund of knowledge:  Good  Insight:   Good  Judgment:  Good  Impulse Control: Good   Risk Assessment: Danger to Self:  No Self-injurious Behavior: No Danger to Others: No Duty to Warn:no Physical Aggression / Violence:No  Access to Firearms a concern: No  Gang Involvement:No   Subjective: The patient attended a face-to-face individual therapy session in the office today.  The patient presents as pleasant and cooperative.  The patient reports that he has been doing very well over the last few weeks.  The patient states that he is doing a better job of not trying to fix his wife's depression.  We talked about him wanting to work a little bit more on his desire to do that and do that with the EMDR.  The patient states that he feels like his compensatory strategies are working well and he is getting back into the routine of doing self-care.  We talked about moving forward with continuing to implement strategies to help him stay organized and focused as opposed to reverting back to his old way of coping.   Interventions: Cognitive Behavioral Therapy and Insight-Oriented  Diagnosis:No diagnosis found.  Plan: Client Abilities/Strengths  Intelligent, insightful, motivated  Client Treatment Preferences  Outpatient individual therapy  Client Statement of Needs  "I decided I  needed therapy to help me sort through a situation I am dealing with"  Treatment Level  Outpatient Individual therapy  Symptoms  Increased defensiveness with his wife: (Status: improved). Passive -  aggressive behaviors: (Status: improved).  Problems Addressed  adjustment disorder, Vocational Stress, Vocational Stress, Vocational Stress, Vocational Stress,  Vocational Stress  Goals 1. Decrease incidents of exhibiting passive aggressive behavior Objective Learn and implement problem-solving skills. Target Date: 2022-09-15 Frequency: Monthly Progress: 90 Modality: individual Related Interventions 1. Conduct Problem-Solving Therapy (see Problem-Solving Therapy by Shawnee Knapp and Delane Ginger) using techniques such as psychoeducation, modeling, and role-playing to teach the client problem solving skills (i.e., defining a problem specifically, generating possible solutions, evaluating the  pros and cons of each solution, selecting and implementing a plan of action, evaluating the  efficacy of the plan, accepting or revising the plan); role-play application of the problemsolving skill to a real life issue (or assign "Applying Problem-Solving to Interpersonal Conflict" in the Adult Psychotherapy Homework Planner by Bryn Gulling). 2. Improve satisfaction and comfort surrounding coworker relationships. 3. Increase job satisfaction and performance due to implementation of  assertiveness and stress management strategies. 4. Increase sense of confidence and competence in dealing with work  responsibilities. Objective Verbalize healthy, realistic cognitive messages that promote harmony with others, self-acceptance, and  self-confidence. Target Date: 2022-09-15 Frequency: Monthly Progress: 90 Modality: individual 5. Increase sense of self-esteem and elevation of mood in spite of  unemployment. 6. Pursue employment consistency with a reasonably hopeful and positive attitude. Diagnosis Adult ADD 296.31 (Major  depressive affective disorder, recurrent episode, mild) - 309.28 (Adjustment disorder  with mixed anxiety and depressed mood) - Conditions For Discharge Achievement of treatment goals and objectives  The patient approved this plan and is making progress.  Constantine Ruddick G Randon Somera, LCSW

## 2022-09-13 ENCOUNTER — Ambulatory Visit (INDEPENDENT_AMBULATORY_CARE_PROVIDER_SITE_OTHER): Payer: BC Managed Care – PPO | Admitting: Psychology

## 2022-09-13 DIAGNOSIS — F3341 Major depressive disorder, recurrent, in partial remission: Secondary | ICD-10-CM | POA: Diagnosis not present

## 2022-09-13 DIAGNOSIS — F902 Attention-deficit hyperactivity disorder, combined type: Secondary | ICD-10-CM

## 2022-09-13 DIAGNOSIS — Z630 Problems in relationship with spouse or partner: Secondary | ICD-10-CM | POA: Diagnosis not present

## 2022-09-14 NOTE — Progress Notes (Signed)
[place] Counselor/Therapist Progress Note  Patient ID: Jonathan [name], MRN: [id number],    Date:09/13/2022  Time Spent: 30 minutes  Treatment Type: Individual Therapy  Reported Symptoms: sadness, inability to pay attention  Mental Status Exam: Appearance:  Casual     Behavior: Appropriate  Motor: Normal  Speech/Language:  Normal Rate  Affect: Appropriate  Mood: normal  Thought process: normal  Thought content:   WNL  Sensory/Perceptual disturbances:   WNL  Orientation: oriented to person, place, time/date, and situation  Attention: Good  Concentration: Good  Memory: WNL  Fund of knowledge:  Good  Insight:   Good  Judgment:  Good  Impulse Control: Good   Risk Assessment: Danger to Self:  No Self-injurious Behavior: No Danger to Others: No Duty to Warn:no Physical Aggression / Violence:No  Access to Firearms a concern: No  Gang Involvement:No   Subjective: The patient attended a face-to-face individual therapy session via video visit today.  The patient gave verbal consent for a video visit today on webex.  The patient was in his home alone and therapist was in the office.  The patient reports that his mother had a stroke over the last 2 weeks and he ended up going to [facility] to help take care of her.  The patient presents as pleasant and cooperative.  The patient reports that he feels like he is doing well with the situation even though it is difficult.  We talked about the need for him to continue to do his self-care activities as he has a tendency to focus on other people and not take care of himself during the process.  The patient stated that he was appreciative that I had said that he needed to do that as he knows this is a pattern for him.  The patient talked about being able to spend some quality time with his mother and feels good about being able to help his brother as he  has the primary caregiving responsibilities.  The patient had to end  the session early because he was going to help his mother with her physical therapy.  We have more appointments scheduled in the coming weeks.    Interventions: Cognitive Behavioral Therapy and Insight-Oriented  Diagnosis:Recurrent major depressive disorder, in partial remission (HCC)  Attention deficit hyperactivity disorder (ADHD), combined type  Marital problems  Plan: Client Abilities/Strengths  Intelligent, insightful, motivated  Client Treatment Preferences  Outpatient individual therapy  Client Statement of Needs  "I decided I needed therapy to help me sort through a situation I am dealing with"  Treatment Level  Outpatient Individual therapy  Symptoms  Increased defensiveness with his wife: (Status: improved). Passive -  aggressive behaviors: (Status: improved).  Problems Addressed  adjustment disorder, Vocational Stress, Vocational Stress, Vocational Stress, Vocational Stress,  Vocational Stress  Goals 1. Decrease incidents of exhibiting passive aggressive behavior Objective Learn and implement problem-solving skills. Target Date: 2022-09-15 Frequency: Monthly Progress: 90 Modality: individual Related Interventions 1. Conduct Problem-Solving Therapy (see Problem-Solving Therapy by Shawnee Knapp and Delane Ginger) using techniques such as psychoeducation, modeling, and role-playing to teach the client problem solving skills (i.e., defining a problem specifically, generating possible solutions, evaluating the  pros and cons of each solution, selecting and implementing a plan of action, evaluating the  efficacy of the plan, accepting or revising the plan); role-play application of the problemsolving skill to a real life issue (or assign "Applying Problem-Solving to Interpersonal Conflict" in the Adult Psychotherapy Homework Planner by Bryn Gulling). 2.  Improve satisfaction and comfort surrounding coworker relationships. 3. Increase job satisfaction and performance due to implementation of   assertiveness and stress management strategies. 4. Increase sense of confidence and competence in dealing with work  responsibilities. Objective Verbalize healthy, realistic cognitive messages that promote harmony with others, self-acceptance, and  self-confidence. Target Date: 2022-09-15 Frequency: Monthly Progress: 90 Modality: individual 5. Increase sense of self-esteem and elevation of mood in spite of  unemployment. 6. Pursue employment consistency with a reasonably hopeful and positive attitude. Diagnosis Adult ADD 296.31 (Major depressive affective disorder, recurrent episode, mild) - 309.28 (Adjustment disorder with mixed anxiety and depressed mood) - Conditions For Discharge Achievement of treatment goals and objectives  The patient approved this plan and is making progress.  Tyshea Imel G Alivya Wegman, LCSW

## 2022-09-15 ENCOUNTER — Other Ambulatory Visit: Payer: Self-pay

## 2022-09-15 DIAGNOSIS — F330 Major depressive disorder, recurrent, mild: Secondary | ICD-10-CM

## 2022-09-15 DIAGNOSIS — F410 Panic disorder [episodic paroxysmal anxiety] without agoraphobia: Secondary | ICD-10-CM

## 2022-09-15 MED ORDER — VILAZODONE HCL 20 MG PO TABS: ORAL_TABLET | ORAL | 1 refills | Status: DC

## 2022-09-27 ENCOUNTER — Telehealth: Payer: Self-pay | Admitting: Internal Medicine

## 2022-09-27 NOTE — Telephone Encounter (Signed)
[facility] is calling pt is requesting #90 rosuvastatin (CRESTOR) 5 MG tablet with 3 refills  [place], [place] Phone:  [phone]  Fax:  [phone]

## 2022-09-29 ENCOUNTER — Other Ambulatory Visit: Payer: Self-pay

## 2022-09-29 MED ORDER — ROSUVASTATIN CALCIUM 5 MG PO TABS: ORAL_TABLET | ORAL | 0 refills | Status: DC

## 2022-09-29 NOTE — Telephone Encounter (Signed)
LOV CPE: 10/19/2020  [facility].  Attempt to reach patient to schedule a physical exam for further refills. Left a voicemail to call us back.

## 2022-10-01 ENCOUNTER — Other Ambulatory Visit: Payer: Self-pay | Admitting: Psychiatry

## 2022-10-01 DIAGNOSIS — F410 Panic disorder [episodic paroxysmal anxiety] without agoraphobia: Secondary | ICD-10-CM

## 2022-10-01 DIAGNOSIS — F330 Major depressive disorder, recurrent, mild: Secondary | ICD-10-CM

## 2022-10-06 ENCOUNTER — Ambulatory Visit: Payer: BC Managed Care – PPO | Admitting: Psychology

## 2022-10-06 ENCOUNTER — Telehealth: Payer: Self-pay | Admitting: Internal Medicine

## 2022-10-06 DIAGNOSIS — F3341 Major depressive disorder, recurrent, in partial remission: Secondary | ICD-10-CM

## 2022-10-06 DIAGNOSIS — F902 Attention-deficit hyperactivity disorder, combined type: Secondary | ICD-10-CM | POA: Diagnosis not present

## 2022-10-06 DIAGNOSIS — I10 Essential (primary) hypertension: Secondary | ICD-10-CM

## 2022-10-06 DIAGNOSIS — Z630 Problems in relationship with spouse or partner: Secondary | ICD-10-CM

## 2022-10-06 DIAGNOSIS — Z79899 Other long term (current) drug therapy: Secondary | ICD-10-CM

## 2022-10-06 DIAGNOSIS — Z125 Encounter for screening for malignant neoplasm of prostate: Secondary | ICD-10-CM

## 2022-10-06 DIAGNOSIS — E119 Type 2 diabetes mellitus without complications: Secondary | ICD-10-CM

## 2022-10-06 DIAGNOSIS — E785 Hyperlipidemia, unspecified: Secondary | ICD-10-CM

## 2022-10-06 DIAGNOSIS — Z0000 Encounter for general adult medical examination without abnormal findings: Secondary | ICD-10-CM

## 2022-10-06 NOTE — Progress Notes (Signed)
[place] Counselor/Therapist Progress Note  Patient ID: Jonathan [name], MRN: [id number],    Date:10/06/2022  Time Spent: 60 minutes  Treatment Type: Individual Therapy  Reported Symptoms: sadness, inability to pay attention  Mental Status Exam: Appearance:  Casual     Behavior: Appropriate  Motor: Normal  Speech/Language:  Normal Rate  Affect: Appropriate  Mood: normal  Thought process: normal  Thought content:   WNL  Sensory/Perceptual disturbances:   WNL  Orientation: oriented to person, place, time/date, and situation  Attention: Good  Concentration: Good  Memory: WNL  Fund of knowledge:  Good  Insight:   Good  Judgment:  Good  Impulse Control: Good   Risk Assessment: Danger to Self:  No Self-injurious Behavior: No Danger to Others: No Duty to Warn:no Physical Aggression / Violence:No  Access to Firearms a concern: No  Gang Involvement:No   Subjective: The patient attended a face-to-face individual therapy session in the office today.  The patient reports that he had a situation with his wife that happened and was able to recognize that he had a reaction with being defensive which causes a lot of trouble with the 2 of them.  We processed how that could have started and talked about doing EMDR to help him not be as reactionary.  We were able to identify a few instances of when that could have gotten established and we talked about doing the EMDR during the next session.  The negative, cognition is "I am caught".  And the patient reports that he would like to put in the cognition of "I am creative".  We will do the EMDR during our next session.   Interventions: Cognitive Behavioral Therapy and Insight-Oriented  Diagnosis:Recurrent major depressive disorder, in partial remission (HCC)  Attention deficit hyperactivity disorder (ADHD), combined type  Marital problems  Plan: Client Abilities/Strengths  Intelligent, insightful, motivated  Client  Treatment Preferences  Outpatient individual therapy  Client Statement of Needs  "I decided I needed therapy to help me sort through a situation I am dealing with"  Treatment Level  Outpatient Individual therapy  Symptoms  Increased defensiveness with his wife: (Status: improved). Passive -  aggressive behaviors: (Status: improved).  Problems Addressed  adjustment disorder, Vocational Stress, Vocational Stress, Vocational Stress, Vocational Stress,  Vocational Stress  Goals 1. Decrease incidents of exhibiting passive aggressive behavior Objective Learn and implement problem-solving skills. Target Date: 2022-09-15 Frequency: Monthly Progress: 90 Modality: individual Related Interventions 1. Conduct Problem-Solving Therapy (see Problem-Solving Therapy by Shawnee Knapp and Delane Ginger) using techniques such as psychoeducation, modeling, and role-playing to teach the client problem solving skills (i.e., defining a problem specifically, generating possible solutions, evaluating the  pros and cons of each solution, selecting and implementing a plan of action, evaluating the  efficacy of the plan, accepting or revising the plan); role-play application of the problemsolving skill to a real life issue (or assign "Applying Problem-Solving to Interpersonal Conflict" in the Adult Psychotherapy Homework Planner by Bryn Gulling). 2. Improve satisfaction and comfort surrounding coworker relationships. 3. Increase job satisfaction and performance due to implementation of  assertiveness and stress management strategies. 4. Increase sense of confidence and competence in dealing with work  responsibilities. Objective Verbalize healthy, realistic cognitive messages that promote harmony with others, self-acceptance, and  self-confidence. Target Date: 2023-09-16 Frequency: Monthly Progress: 90 Modality: individual 5. Increase sense of self-esteem and elevation of mood in spite of  unemployment. 6. Pursue employment  consistency with a reasonably hopeful and positive attitude. Diagnosis Adult ADD  296.31 (Major depressive affective disorder, recurrent episode, mild) - 309.28 (Adjustment disorder with mixed anxiety and depressed mood) - Conditions For Discharge Achievement of treatment goals and objectives  The patient approved this plan and is making progress.  Datrell Dunton G Zauria Dombek, LCSW

## 2022-10-06 NOTE — Telephone Encounter (Signed)
Pt has cpe sch for 12-20-2022 and would like cpe labs in advance

## 2022-10-09 NOTE — Telephone Encounter (Signed)
Future orders have been placed He can get fasting lab  before his CPE visit

## 2022-10-10 NOTE — Telephone Encounter (Signed)
Contact patient. Left a detail voice message. Left a voicemail to call us back if have any question.

## 2022-10-11 ENCOUNTER — Encounter: Payer: Self-pay | Admitting: Psychiatry

## 2022-10-11 ENCOUNTER — Ambulatory Visit: Payer: BC Managed Care – PPO | Admitting: Psychiatry

## 2022-10-11 DIAGNOSIS — F902 Attention-deficit hyperactivity disorder, combined type: Secondary | ICD-10-CM | POA: Diagnosis not present

## 2022-10-11 DIAGNOSIS — F3342 Major depressive disorder, recurrent, in full remission: Secondary | ICD-10-CM | POA: Diagnosis not present

## 2022-10-11 NOTE — Progress Notes (Signed)
Jonathan [name] [id number] 08-11-1961 61 y.o.  Subjective:   Patient ID:  Jonathan [name] is a 61 y.o. (DOB 24-Dec-1960) male.  Chief Complaint: No chief complaint on file.   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 He reports that taking Viibryd mid-day has been helpful and has noticed a change on rare occasions when he has not been able to take mid-day dose. "I feel balanced." Notices less worry and anxiety. He notices some occ "defensiveness... an edge." He reports that he had "an episode" yesterday that "was shorter" compared to the past.   He reports that he has developed compensatory strategies to help with ADHD. He reports that concentration seems to be manageable and he is managing responsibilities and meet deadlines. He reports that he occasionally will take on more things and then feel overwhelmed. He reports little to no depression. He reports that his energy has been good. Motivation has been good, even with some resistance and challenges. He notices that he is focusing less on negativity. Sleeping "good, but I don't feel like I don't get enough" due to work and pets. Usually able to return to sleep after awakening. He reports that he often feels like he could use one more hour of sleep. "I'm not ensuring I get to bed sooner." He reports that his appetite is less overall. He reports eating an adequate amount. Denies SI.     Past Psychiatric Medication Trials: Citalopram- Sexual side effects, fatigue. Partially effective for depression. Increased to 15 mg in the last month and noticed more fatigue. He reports that he has been taking 10 mg most of the time since then.  Trintellix- Improved mood, anxiety, and focus on 5 mg. Excessive  somnolence on 10 mg. Pristiq Wellbutrin- Took 25 years ago. Cannot recall response. Concerta Modafinil-Helpful for concentration. Caused some edginess      PHQ2-9    Flowsheet Row Video Visit from 07/11/2022 in [facility] at [facility] from 05/05/2021 in [facility] at [facility] from 10/19/2020 in [facility] at [facility] from 08/02/2019 in [place] at [facility] from 10/01/2018 in [place] [facility] at [facility] Total Score 3 2 0 4 0  PHQ-9 Total Score '12 12 9 15 '$ --      Flowsheet Row ED from 03/21/2022 in [facility] [place] [facility] from 03/17/2022 in [facility] [place] [facility] from 07/16/2021 in [facility] [place] No Risk No Risk Error: Question 6 not populated        Review of Systems:  Review of Systems  Gastrointestinal:        Occ loose stools  Musculoskeletal:  Negative for gait problem.       Pain with achilles tendon  Neurological:  Negative for tremors.  Psychiatric/Behavioral:         Please refer to HPI    Medications: I have reviewed the patient's current medications.  Current Outpatient Medications  Medication Sig Dispense Refill  . albuterol (VENTOLIN HFA) 108 (90 Base) MCG/ACT inhaler Inhale 2 puffs into the lungs every 6 (six) hours as needed for wheezing or shortness of breath. (Patient not  taking: Reported on 07/11/2022) 18 g 1  . ALPRAZolam (XANAX) 0.5 MG tablet Take 1/2-1 tab as needed every 4-6 hours as needed for severe anxiety and panic (Patient not taking: Reported on 07/11/2022) 30 tablet 0  . B Complex Vitamins (VITAMIN B COMPLEX PO) Take by mouth.    . fexofenadine (ALLEGRA) 180 MG tablet Take 1 tablet (180 mg total) by mouth daily. (Patient not taking: Reported on 07/11/2022) 90 tablet 1  . fluticasone (FLONASE) 50 MCG/ACT nasal spray Place 1 spray into both nostrils daily. Begin by  using 2 sprays in each nare daily for 3 to 5 days, then decrease to 1 spray in each nare daily. 32 mL 1  . guaifenesin (HUMIBID E) 400 MG TABS tablet Take 1 tablet 3 times daily as needed for chest congestion and cough (Patient not taking: Reported on 07/11/2022) 21 tablet 0  . ipratropium (ATROVENT) 0.06 % nasal spray Place 2 sprays into both nostrils 3 (three) times daily. As needed for nasal congestion, runny nose (Patient not taking: Reported on 07/11/2022) 15 mL 1  . losartan-hydrochlorothiazide (HYZAAR) 50-12.5 MG tablet TAKE 2 TABLETS DAILY 180 tablet 3  . metFORMIN (GLUCOPHAGE-XR) 500 MG 24 hr tablet Take by mouth  3 per day as directed ( total '1500mg'$ ) 270 tablet `  . Omega-3 Fatty Acids (FISH OIL PO) Take by mouth.    . Probiotic Product (PROBIOTIC PO) Take 1 tablet by mouth every other day.    . rosuvastatin (CRESTOR) 5 MG tablet Take 5 mg  by mouth 5 days per week for 2 weeks  and then increase to  5 mg 7 days per  week as tolerated 90 tablet 0  . Vilazodone HCl 20 MG TABS Take 1 tablet (20 mg total) by mouth daily with breakfast AND 0.5 tablets (10 mg total) daily with lunch. 135 tablet 1   No current facility-administered medications for this visit.    Medication Side Effects: Other: Some loose stools  Allergies:  Allergies  Allergen Reactions  . Statins Other (See Comments)    myalgias    . Zocor [Simvastatin]      myalgias  . Penicillins Hives    Per pt - happened in childhood - hives possible reaction    Past Medical History:  Diagnosis Date  . Allergic rhinitis   . Allergy   . Anxiety   . Depression   . Diabetes mellitus without complication ([place])    type 2  . Heart murmur    per pt told it was functional  . History of kidney stones   . History of nephrolithiasis   . Hyperlipidemia   . Hypertension   . Sebaceous cyst     Past Medical History, Surgical history, Social history, and Family history were reviewed and updated as appropriate.   Please see review  of systems for further details on the patient's review from today.   Objective:   Physical Exam:  There were no vitals taken for this visit.  Physical Exam Constitutional:      General: He is not in acute distress. Musculoskeletal:        General: No deformity.  Neurological:     Mental Status: He is alert and oriented to person, place, and time.     Coordination: Coordination normal.  Psychiatric:        Attention and Perception: Attention and perception normal. He does not perceive auditory or visual hallucinations.        Mood and Affect: Mood  normal. Mood is not anxious or depressed. Affect is not labile, blunt, angry or inappropriate.        Speech: Speech normal.        Behavior: Behavior normal.        Thought Content: Thought content normal. Thought content is not paranoid or delusional. Thought content does not include homicidal or suicidal ideation. Thought content does not include homicidal or suicidal plan.        Cognition and Memory: Cognition and memory normal.        Judgment: Judgment normal.     Comments: Insight intact    Lab Review:     Component Value Date/Time   NA 141 03/30/2022 0747   NA 141 08/17/2021 0000   K 4.7 03/30/2022 0747   CL 101 03/30/2022 0747   CO2 30 03/30/2022 0747   GLUCOSE 141 (H) 03/30/2022 0747   BUN 16 03/30/2022 0747   CREATININE 1.22 03/30/2022 0747   CREATININE 1.15 10/19/2020 1206   CALCIUM 9.3 03/30/2022 0747   PROT 6.5 03/30/2022 0747   ALBUMIN 4.1 03/30/2022 0747   AST 18 03/30/2022 0747   ALT 27 03/30/2022 0747   ALKPHOS 65 03/30/2022 0747   BILITOT 0.7 03/30/2022 0747   GFRNONAA 72.1 08/17/2021 0000   GFRNONAA 68 10/19/2020 1206   GFRAA 83.5 08/17/2021 0000   GFRAA 79 10/19/2020 1206       Component Value Date/Time   WBC 9.6 03/30/2022 0747   RBC 5.22 03/30/2022 0747   HGB 16.3 03/30/2022 0747   HCT 48.4 03/30/2022 0747   PLT 358.0 03/30/2022 0747   MCV 92.8 03/30/2022 0747   MCV 94.2 04/29/2013 0936    MCH 30.9 10/19/2020 1206   MCHC 33.7 03/30/2022 0747   RDW 13.3 03/30/2022 0747   LYMPHSABS 2.3 03/30/2022 0747   MONOABS 0.9 03/30/2022 0747   EOSABS 0.2 03/30/2022 0747   BASOSABS 0.1 03/30/2022 0747    No results found for: "POCLITH", "LITHIUM"   No results found for: "PHENYTOIN", "PHENOBARB", "VALPROATE", "CBMZ"   .res Assessment: Plan:    There are no diagnoses linked to this encounter.   Please see After Visit Summary for patient specific instructions.  Future Appointments  Date Time Provider [place]  10/25/2022  8:00 AM Cottle, Lucious Groves, LCSW [facility] None  11/15/2022  8:00 AM Cottle, Bambi G, LCSW [facility] None  12/20/2022 10:30 AM Panosh, Standley Brooking, MD [id number]    No orders of the defined types were placed in this encounter.   -------------------------------

## 2022-10-12 NOTE — Telephone Encounter (Signed)
Pt has been sch for cpe lab on 12-13-2022

## 2022-10-20 NOTE — Telephone Encounter (Signed)
Pt has cpe sch for 12-20-2022

## 2022-10-25 ENCOUNTER — Ambulatory Visit: Payer: BC Managed Care – PPO | Admitting: Psychology

## 2022-10-25 DIAGNOSIS — Z630 Problems in relationship with spouse or partner: Secondary | ICD-10-CM | POA: Diagnosis not present

## 2022-10-25 DIAGNOSIS — F3342 Major depressive disorder, recurrent, in full remission: Secondary | ICD-10-CM | POA: Diagnosis not present

## 2022-10-25 DIAGNOSIS — F902 Attention-deficit hyperactivity disorder, combined type: Secondary | ICD-10-CM | POA: Diagnosis not present

## 2022-10-26 NOTE — Progress Notes (Signed)
[facility] Counselor/Therapist Progress Note  Patient ID: Jonathan [name], MRN: [id number],    Date:10/25/2022  Time Spent: 60 minutes  Treatment Type: Individual Therapy  Reported Symptoms: sadness, inability to pay attention  Mental Status Exam: Appearance:  Casual     Behavior: Appropriate  Motor: Normal  Speech/Language:  Normal Rate  Affect: Appropriate  Mood: normal  Thought process: normal  Thought content:   WNL  Sensory/Perceptual disturbances:   WNL  Orientation: oriented to person, place, time/date, and situation  Attention: Good  Concentration: Good  Memory: WNL  Fund of knowledge:  Good  Insight:   Good  Judgment:  Good  Impulse Control: Good   Risk Assessment: Danger to Self:  No Self-injurious Behavior: No Danger to Others: No Duty to Warn:no Physical Aggression / Violence:No  Access to Firearms a concern: No  Gang Involvement:No   Subjective: The patient attended a face-to-face individual therapy session in the office today.  The patient reports that he is doing better and we talked about the positive cognition that he wants to put in when we do EMDR.  We discussed this being in relation to his defensiveness.  We did EMDR with the negative cognition of "I am caught" and we decided to put in the Positive cognition of "I keep my commitments and I set appropriate boundaries".  Coming up with this cognition took a great deal of work and processing to find out what it is that he really feels like he wanted to replace the negative cognition with.  The patient felt that the session was extremely helpful.   Interventions: Cognitive Behavioral Therapy and Insight-Oriented  Diagnosis:Recurrent major depressive disorder, in full remission ([place])  Marital problems  Attention deficit hyperactivity disorder (ADHD), combined type  Plan: Client Abilities/Strengths  Intelligent, insightful, motivated  Client Treatment Preferences  Outpatient  individual therapy  Client Statement of Needs  "I decided I needed therapy to help me sort through a situation I am dealing with"  Treatment Level  Outpatient Individual therapy  Symptoms  Increased defensiveness with his wife: (Status: improved). Passive -  aggressive behaviors: (Status: improved).  Problems Addressed  adjustment disorder, Vocational Stress, Vocational Stress, Vocational Stress, Vocational Stress,  Vocational Stress  Goals 1. Decrease incidents of exhibiting passive aggressive behavior Objective Learn and implement problem-solving skills. Target Date: 2023-09-16 Frequency: Monthly Progress: 90 Modality: individual Related Interventions 1. Conduct Problem-Solving Therapy (see Problem-Solving Therapy by Shawnee Knapp and Delane Ginger) using techniques such as psychoeducation, modeling, and role-playing to teach the client problem solving skills (i.e., defining a problem specifically, generating possible solutions, evaluating the  pros and cons of each solution, selecting and implementing a plan of action, evaluating the  efficacy of the plan, accepting or revising the plan); role-play application of the problemsolving skill to a real life issue (or assign "Applying Problem-Solving to Interpersonal Conflict" in the Adult Psychotherapy Homework Planner by Bryn Gulling). 2. Improve satisfaction and comfort surrounding coworker relationships. 3. Increase job satisfaction and performance due to implementation of  assertiveness and stress management strategies. 4. Increase sense of confidence and competence in dealing with work  responsibilities. Objective Verbalize healthy, realistic cognitive messages that promote harmony with others, self-acceptance, and  self-confidence. Target Date: 2023-09-16 Frequency: Monthly Progress: 90 Modality: individual 5. Increase sense of self-esteem and elevation of mood in spite of  unemployment. 6. Pursue employment consistency with a reasonably  hopeful and positive attitude. Diagnosis Adult ADD 296.31 (Major depressive affective disorder, recurrent episode, mild) - 309.28 (Adjustment disorder with  mixed anxiety and depressed mood) - Conditions For Discharge Achievement of treatment goals and objectives  The patient approved this plan and is making progress.  Shaughn Thomley G Pravin Perezperez, LCSW

## 2022-10-28 ENCOUNTER — Encounter: Payer: Self-pay | Admitting: Internal Medicine

## 2022-10-28 ENCOUNTER — Telehealth: Payer: BC Managed Care – PPO | Admitting: Family Medicine

## 2022-10-28 DIAGNOSIS — U071 COVID-19: Secondary | ICD-10-CM | POA: Diagnosis not present

## 2022-10-28 NOTE — Patient Instructions (Signed)
Quarantine and Isolation Quarantine and isolation refer to local and travel restrictions to protect the public and travelers from contagious diseases that constitute a public health threat. Contagious diseases are diseases that can spread from one person to another. Quarantine and isolation help to protect the public by preventing exposure to people who have or may have a contagious disease. Isolation separates people who are sick with a contagious disease from people who are not sick. Quarantine separates and restricts the movement of people who were exposed to a contagious disease to see if they become sick. You may be put in quarantine or isolation if you have been exposed to or diagnosed with any of the following diseases: Severe acute respiratory syndromes, such as COVID-19. Cholera. Diphtheria. Tuberculosis. Plague. Smallpox. Yellow fever. Viral hemorrhagic fevers, such as [place], Ebola, and [place]. When to quarantine or isolate Follow these rules, whether you have been vaccinated or not: Stay home and isolate from others when you are sick with a contagious disease. Isolate when you test positive for a contagious disease, even if you do not have symptoms. Isolate if you are sick and suspect that you may have a contagious disease. If you suspect that you have a contagious disease, get tested. If your test results are negative, you can end your isolation. If your test results are positive, follow the full isolation recommendations as told by your health care provider or local health authorities. Quarantine and stay away from others when you have been in close contact with someone who has tested positive for a contagious disease. Close contact is defined as being less than 6 ft (1.8 m) away from an infected person for a total of 15 minutes or more over a 24-hour period. Do not go to places where you are unable to wear a mask, such as restaurants and some gyms. Stay home and separate  from others as much as possible. Avoid being around people who may get very sick from the contagious disease that you have. Use a separate bathroom, if possible. Do not travel. For travel guidance, visit the CDC's travel webpage at wwwnc.cdc.gov/travel/ Follow these instructions at home: Medicines  Take over-the-counter and prescription medicines as told by your health care provider. Finish all antibiotic medicine even when you start to feel better. Stay up to date with all your vaccines. Get scheduled vaccines and boosters as recommended by your health care provider. Lifestyle Wear a high-quality mask if you must be around others at home and in public, if recommended. Improve air flow (ventilation) at home to help prevent the disease from spreading to other people, if possible. Do not share personal household items, like cups, towels, and utensils. Practice everyday hygiene and cleaning. General instructions Talk to your health care provider if you have a weakened body defense system (immune system). People with a weakened immune system may have a reduced immune response to vaccines. You may need to follow current prevention measures, including wearing a well-fitting mask, avoiding crowds, and avoiding poorly ventilated indoor places. Monitor symptoms and follow health care provider instructions, which may include resting, drinking fluids, and taking medicines. Follow specific isolation and quarantine recommendations if you are in places that can lead to disease outbreaks, such as correctional and detention facilities, homeless shelters, and cruise ships. Return to your normal activities as told by your health care provider. Ask your health care provider what activities are safe for you. Keep all follow-up visits. This is important. Where to find more information CDC: www.cdc.gov/quarantine/index.html Contact   a health care provider if: You have a fever. You have signs and symptoms that  return or get worse after isolation. Get help right away if: You have difficulty breathing. You have chest pain. These symptoms may be an emergency. Get help right away. Call 911. Do not wait to see if the symptoms will go away. Do not drive yourself to the hospital. Summary Isolation and quarantine help protect the public by preventing exposure to people who have or may have a contagious disease. Isolate when you are sick or when you test positive, even if you do not have symptoms. Quarantine and stay away from others when you have been in close contact with someone who has tested positive for a contagious disease. This information is not intended to replace advice given to you by your health care provider. Make sure you discuss any questions you have with your health care provider. Document Revised: 12/02/2021 Document Reviewed: 11/11/2021 Elsevier Patient Education  [place] GEZMO-29, or coronavirus disease 2019, is an infection that is caused by a new (novel) coronavirus called SARS-CoV-2. COVID-19 can cause many symptoms. In some people, the virus may not cause any symptoms. In others, it may cause mild or severe symptoms. Some people with severe infection develop severe disease. What are the causes? This illness is caused by a virus. The virus may be in the air as tiny specks of fluid (aerosols) or droplets, or it may be on surfaces. You may catch the virus by: Breathing in droplets from an infected person. Droplets can be spread by a person breathing, speaking, singing, coughing, or sneezing. Touching something, like a table or a doorknob, that has virus on it (is contaminated) and then touching your mouth, nose, or eyes. What increases the risk? Risk for infection: You are more likely to get infected with the COVID-19 virus if: You are within 6 ft (1.8 m) of a person with COVID-19 for 15 minutes or longer. You are providing care for a person who is infected with  COVID-19. You are in close personal contact with other people. Close personal contact includes hugging, kissing, or sharing eating or drinking utensils. Risk for serious illness caused by COVID-19: You are more likely to get seriously ill from the COVID-19 virus if: You have cancer. You have a long-term (chronic) disease, such as: Chronic lung disease. This includes pulmonary embolism, chronic obstructive pulmonary disease, and cystic fibrosis. Long-term disease that lowers your body's ability to fight infection (immunocompromise). Serious cardiac conditions, such as heart failure, coronary artery disease, or cardiomyopathy. Diabetes. Chronic kidney disease. Liver diseases. These include cirrhosis, nonalcoholic fatty liver disease, alcoholic liver disease, or autoimmune hepatitis. You have obesity. You are pregnant or were recently pregnant. You have sickle cell disease. What are the signs or symptoms? Symptoms of this condition can range from mild to severe. Symptoms may appear any time from 2 to 14 days after being exposed to the virus. They include: Fever or chills. Shortness of breath or trouble breathing. Feeling tired or very tired. Headaches, body aches, or muscle aches. Runny or stuffy nose, sneezing, coughing, or sore throat. New loss of taste or smell. This is rare. Some people may also have stomach problems, such as nausea, vomiting, or diarrhea. Other people may not have any symptoms of COVID-19. How is this diagnosed? This condition may be diagnosed by testing samples to check for the COVID-19 virus. The most common tests are the PCR test and the antigen test. Tests may be done  in the lab or at home. They include: Using a swab to take a sample of fluid from the back of your nose and throat (nasopharyngeal fluid), from your nose, or from your throat. Testing a sample of saliva from your mouth. Testing a sample of coughed-up mucus from your lungs (sputum). How is this  treated? Treatment for COVID-19 infection depends on the severity of the condition. Mild symptoms can be managed at home with rest, fluids, and over-the-counter medicines. Serious symptoms may be treated in a hospital intensive care unit (ICU). Treatment in the ICU may include: Supplemental oxygen. Extra oxygen is given through a tube in the nose, a face mask, or a hood. Medicines. These may include: Antivirals, such as monoclonal antibodies. These help your body fight off certain viruses that can cause disease. Anti-inflammatories, such as corticosteroids. These reduce inflammation and suppress the immune system. Antithrombotics. These prevent or treat blood clots, if they develop. Convalescent plasma. This helps boost your immune system, if you have an underlying immunosuppressive condition or are getting immunosuppressive treatments. Prone positioning. This means you will lie on your stomach. This helps oxygen to get into your lungs. Infection control measures. If you are at risk for more serious illness caused by COVID-19, your health care provider may prescribe two long-acting monoclonal antibodies, given together every 6 months. How is this prevented? To protect yourself: Use preventive medicine (pre-exposure prophylaxis). You may get pre-exposure prophylaxis if you have moderate or severe immunocompromise. Get vaccinated. Anyone 21 months old or older who meets guidelines can get a COVID-19 vaccine or vaccine series. This includes people who are pregnant or making breast milk (lactating). Get an added dose of COVID-19 vaccine after your first vaccine or vaccine series if you have moderate to severe immunocompromise. This applies if you have had a solid organ transplant or have been diagnosed with an immunocompromising condition. You should get the added dose 4 weeks after you got the first COVID-19 vaccine or vaccine series. If you get an mRNA vaccine, you will need a 3-dose primary  series. If you get the J&J/Janssen vaccine, you will need a 2-dose primary series, with the second dose being an mRNA vaccine. Talk to your health care provider about getting experimental monoclonal antibodies. This treatment is approved under emergency use authorization to prevent severe illness before or after being exposed to the COVID-19 virus. You may be given monoclonal antibodies if: You have moderate or severe immunocompromise. This includes treatments that lower your immune response. People with immunocompromise may not develop protection against COVID-19 when they are vaccinated. You cannot be vaccinated. You may not get a vaccine if you have a severe allergic reaction to the vaccine or its components. You are not fully vaccinated. You are in a facility where COVID-19 is present and: Are in close contact with a person who is infected with the COVID-19 virus. Are at high risk of being exposed to the COVID-19 virus. You are at risk of illness from new variants of the COVID-19 virus. To protect others: If you have symptoms of COVID-19, take steps to prevent the virus from spreading to others. Stay home. Leave your house only to get medical care. Do not use public transit, if possible. Do not travel while you are sick. Wash your hands often with soap and water for at least 20 seconds. If soap and water are not available, use alcohol-based hand sanitizer. Make sure that all people in your household wash their hands well and often. Cough or sneeze  into a tissue or your sleeve or elbow. Do not cough or sneeze into your hand or into the air. Where to find more information Centers for Disease Control and Prevention: CharmCourses.be World Health Organization: https://www.castaneda.info/ Get help right away if: You have trouble breathing. You have pain or pressure in your chest. You are confused. You have bluish lips and fingernails. You have trouble waking from sleep. You  have symptoms that get worse. These symptoms may be an emergency. Get help right away. Call 911. Do not wait to see if the symptoms will go away. Do not drive yourself to the hospital. Summary COVID-19 is an infection that is caused by a new coronavirus. Sometimes, there are no symptoms. Other times, symptoms range from mild to severe. Some people with a severe COVID-19 infection develop severe disease. The virus that causes COVID-19 can spread from person to person through droplets or aerosols from breathing, speaking, singing, coughing, or sneezing. Mild symptoms of COVID-19 can be managed at home with rest, fluids, and over-the-counter medicines. This information is not intended to replace advice given to you by your health care provider. Make sure you discuss any questions you have with your health care provider. Document Revised: 11/09/2021 Document Reviewed: 11/11/2021 Elsevier Patient Education  [place].

## 2022-10-28 NOTE — Progress Notes (Signed)
Virtual Visit Consent   Jonathan [name], you are scheduled for a virtual visit with a [facility] provider today. Just as with appointments in the office, your consent must be obtained to participate. Your consent will be active for this visit and any virtual visit you may have with one of our providers in the next 365 days. If you have a MyChart account, a copy of this consent can be sent to you electronically.  As this is a virtual visit, video technology does not allow for your provider to perform a traditional examination. This may limit your provider's ability to fully assess your condition. If your provider identifies any concerns that need to be evaluated in person or the need to arrange testing (such as labs, EKG, etc.), we will make arrangements to do so. Although advances in technology are sophisticated, we cannot ensure that it will always work on either your end or our end. If the connection with a video visit is poor, the visit may have to be switched to a telephone visit. With either a video or telephone visit, we are not always able to ensure that we have a secure connection.  By engaging in this virtual visit, you consent to the provision of healthcare and authorize for your insurance to be billed (if applicable) for the services provided during this visit. Depending on your insurance coverage, you may receive a charge related to this service.  I need to obtain your verbal consent now. Are you willing to proceed with your visit today? PIERRE DELLAROCCO has provided verbal consent on 10/28/2022 for a virtual visit (video or telephone). Dellia Nims, FNP  Date: 10/28/2022 1:39 PM  Virtual Visit via Video Note   I, Dellia Nims, connected with  Jonathan [name]  ([id number], 10/06/1961) on 10/28/22 at  1:30 PM EST by a video-enabled telemedicine application and verified that I am speaking with the correct person using two identifiers.  Location: Patient: Virtual Visit Location Patient:  Home Provider: Virtual Visit Location Provider: Home Office   I discussed the limitations of evaluation and management by telemedicine and the availability of in person appointments. The patient expressed understanding and agreed to proceed.    History of Present Illness: Jonathan [name] is a 61 y.o. who identifies as a male who was assigned male at birth, and is being seen today for positive covid testing today. Sx started yesterday. Daughter tested postive Wednesday. He has fatigue, body aches and cough. No wheezing, sob. He is in no distress. Marland Kitchen  HPI: HPI  Problems:  Patient Active Problem List   Diagnosis Date Noted   Prediabetes 05/15/2016   Essential hypertension 05/15/2016   Hyperlipidemia 05/15/2016   Dyspepsia and other specified disorders of function of stomach 10/25/2013   Change in bowel habits 09/07/2013   Medication management 09/07/2013   Heartburn symptom 09/07/2013   Hyperglycemia 09/04/2013   BMI 31.0-31.9,adult 05/07/2013   SKIN LESION, UNCERTAIN SIGNIFICANCE 03/31/2010   NEPHROLITHIASIS, HX OF 02/10/2008   HYPERLIPIDEMIA 02/06/2008   ANXIETY 02/06/2008   DEPRESSION 02/06/2008   ATTENTION DEFICIT DISORDER, ADULT 02/06/2008   HYPERTENSION 02/06/2008   ALLERGIC RHINITIS 02/06/2008    Allergies:  Allergies  Allergen Reactions   Statins Other (See Comments)    myalgias     Zocor [Simvastatin]      myalgias   Penicillins Hives    Per pt - happened in childhood - hives possible reaction   Medications:  Current Outpatient Medications:    albuterol (VENTOLIN  HFA) 108 (90 Base) MCG/ACT inhaler, Inhale 2 puffs into the lungs every 6 (six) hours as needed for wheezing or shortness of breath. (Patient not taking: Reported on 07/11/2022), Disp: 18 g, Rfl: 1   ALPRAZolam (XANAX) 0.5 MG tablet, Take 1/2-1 tab as needed every 4-6 hours as needed for severe anxiety and panic (Patient not taking: Reported on 07/11/2022), Disp: 30 tablet, Rfl: 0   B Complex Vitamins  (VITAMIN B COMPLEX PO), Take by mouth., Disp: , Rfl:    fexofenadine (ALLEGRA) 180 MG tablet, Take 1 tablet (180 mg total) by mouth daily. (Patient not taking: Reported on 07/11/2022), Disp: 90 tablet, Rfl: 1   fluticasone (FLONASE) 50 MCG/ACT nasal spray, Place 1 spray into both nostrils daily. Begin by using 2 sprays in each nare daily for 3 to 5 days, then decrease to 1 spray in each nare daily., Disp: 32 mL, Rfl: 1   guaifenesin (HUMIBID E) 400 MG TABS tablet, Take 1 tablet 3 times daily as needed for chest congestion and cough (Patient not taking: Reported on 07/11/2022), Disp: 21 tablet, Rfl: 0   ipratropium (ATROVENT) 0.06 % nasal spray, Place 2 sprays into both nostrils 3 (three) times daily. As needed for nasal congestion, runny nose (Patient not taking: Reported on 07/11/2022), Disp: 15 mL, Rfl: 1   losartan-hydrochlorothiazide (HYZAAR) 50-12.5 MG tablet, TAKE 2 TABLETS DAILY, Disp: 180 tablet, Rfl: 3   metFORMIN (GLUCOPHAGE-XR) 500 MG 24 hr tablet, Take by mouth  3 per day as directed ( total '1500mg'$ ), Disp: 270 tablet, Rfl: `   Omega-3 Fatty Acids (FISH OIL PO), Take by mouth., Disp: , Rfl:    Probiotic Product (PROBIOTIC PO), Take 1 tablet by mouth every other day., Disp: , Rfl:    rosuvastatin (CRESTOR) 5 MG tablet, Take 5 mg  by mouth 5 days per week for 2 weeks  and then increase to  5 mg 7 days per  week as tolerated, Disp: 90 tablet, Rfl: 0   Vilazodone HCl 20 MG TABS, Take 1 tablet (20 mg total) by mouth daily with breakfast AND 0.5 tablets (10 mg total) daily with lunch., Disp: 135 tablet, Rfl: 1   VITAMIN E COMPLEX PO, Take by mouth., Disp: , Rfl:   Observations/Objective: Patient is well-developed, well-nourished in no acute distress.  Resting comfortably  at home.  Head is normocephalic, atraumatic.  No labored breathing.  Speech is clear and coherent with logical content.  Patient is alert and oriented at baseline.    Assessment and Plan: 1. COVID-19  Increase fluids, MVI,  tylenol or ibuprofen, quarantine, discussed with sx starting yesterday he will wait a couple of days to see how sx progress and call back if he wants to start paxlovid.   Follow Up Instructions: I discussed the assessment and treatment plan with the patient. The patient was provided an opportunity to ask questions and all were answered. The patient agreed with the plan and demonstrated an understanding of the instructions.  A copy of instructions were sent to the patient via MyChart unless otherwise noted below.     The patient was advised to call back or seek an in-person evaluation if the symptoms worsen or if the condition fails to improve as anticipated.  Time:  I spent 10 minutes with the patient via telehealth technology discussing the above problems/concerns.    Dellia Nims, FNP

## 2022-10-31 ENCOUNTER — Encounter: Payer: Self-pay | Admitting: Internal Medicine

## 2022-10-31 ENCOUNTER — Telehealth (INDEPENDENT_AMBULATORY_CARE_PROVIDER_SITE_OTHER): Payer: BC Managed Care – PPO | Admitting: Internal Medicine

## 2022-10-31 VITALS — BP 115/75 | HR 91

## 2022-10-31 DIAGNOSIS — I10 Essential (primary) hypertension: Secondary | ICD-10-CM

## 2022-10-31 DIAGNOSIS — Z79899 Other long term (current) drug therapy: Secondary | ICD-10-CM

## 2022-10-31 DIAGNOSIS — U071 COVID-19: Secondary | ICD-10-CM | POA: Diagnosis not present

## 2022-10-31 DIAGNOSIS — E119 Type 2 diabetes mellitus without complications: Secondary | ICD-10-CM | POA: Diagnosis not present

## 2022-10-31 NOTE — Telephone Encounter (Signed)
Make   video appt this afternoon to assess.

## 2022-10-31 NOTE — Progress Notes (Signed)
Virtual Visit via Video Note  I connected with Jonathan [name] on 10/31/22 at  4:00 PM EST by a video enabled telemedicine application and verified that I am speaking with the correct person using two identifiers. Location patient: home Location provider:work office Persons participating in the virtual visit: patient, provider  Patient aware  of the limitations of evaluation and management by telemedicine and  availability of in person appointments. and agreed to proceed.   HPI: Jonathan [name] presents for video visit onset 5 days ago of achy feverish  ur congestion and fatigue  had vv e visit and  decided at that time toi not take paxlovid  since then is feeling better but still tired and has ?s   Eyes burning   tired and exhausted    better that at beginnning    Cough congestion    no gi sx   no sob   Has hadoriginal vaccine and 3 boosters   second dx of COvid  last APril  and now   duahgter tested positive first mild sx .  Wife not sick although may be getting st   He has isolated as possible  Couldn't  do services    Sunday   Today 5 day  tending to think not to take antivirals ROS: See pertinent positives and negatives per HPI. No gi illness  has sem dec smell   Past Medical History:  Diagnosis Date   Allergic rhinitis    Allergy    Anxiety    Depression    Diabetes mellitus without complication ([place])    type 2   Heart murmur    per pt told it was functional   History of kidney stones    History of nephrolithiasis    Hyperlipidemia    Hypertension    Sebaceous cyst     Past Surgical History:  Procedure Laterality Date   COLONOSCOPY  07/2010   Deatra Ina   CYSTOSCOPY/URETEROSCOPY/HOLMIUM LASER/STENT PLACEMENT Left 01/15/2020   Procedure: CYSTOSCOPY/URETEROSCOPY/HOLMIUM LASER/STENT PLACEMENT;  Surgeon: Ceasar Mons, MD;  Location: [facility];  Service: Urology;  Laterality: Left;   kidney stones removed  cystoscopy   x 2   TONSILLECTOMY      x 2   TYMPANOSTOMY TUBE PLACEMENT  as child    Family History  Problem Relation Age of Onset   Diabetes Father    Hypertension Father    Kidney cancer Father        now on dialysis   Kidney Stones Father    Kidney Stones Brother    Huntington's disease Maternal Grandfather    Depression Daughter    Anxiety disorder Daughter    ADD / ADHD Daughter    Colon polyps Daughter 85   Colon cancer Neg Hx    Esophageal cancer Neg Hx    Stomach cancer Neg Hx    Rectal cancer Neg Hx     Social History   Tobacco Use   Smoking status: Never   Smokeless tobacco: Never  Vaping Use   Vaping Use: Never used  Substance Use Topics   Alcohol use: Yes    Comment: less than 1 a week   Drug use: No      Current Outpatient Medications:    B Complex Vitamins (VITAMIN B COMPLEX PO), Take by mouth., Disp: , Rfl:    fluticasone (FLONASE) 50 MCG/ACT nasal spray, Place 1 spray into both nostrils daily. Begin by using 2 sprays in each nare daily  for 3 to 5 days, then decrease to 1 spray in each nare daily., Disp: 32 mL, Rfl: 1   losartan-hydrochlorothiazide (HYZAAR) 50-12.5 MG tablet, TAKE 2 TABLETS DAILY, Disp: 180 tablet, Rfl: 3   metFORMIN (GLUCOPHAGE-XR) 500 MG 24 hr tablet, Take by mouth  3 per day as directed ( total '1500mg'$ ), Disp: 270 tablet, Rfl: `   Omega-3 Fatty Acids (FISH OIL PO), Take by mouth., Disp: , Rfl:    Probiotic Product (PROBIOTIC PO), Take 1 tablet by mouth every other day., Disp: , Rfl:    rosuvastatin (CRESTOR) 5 MG tablet, Take 5 mg  by mouth 5 days per week for 2 weeks  and then increase to  5 mg 7 days per  week as tolerated (Patient taking differently: daily.), Disp: 90 tablet, Rfl: 0   Vilazodone HCl 20 MG TABS, Take 1 tablet (20 mg total) by mouth daily with breakfast AND 0.5 tablets (10 mg total) daily with lunch., Disp: 135 tablet, Rfl: 1   VITAMIN E COMPLEX PO, Take by mouth., Disp: , Rfl:    albuterol (VENTOLIN HFA) 108 (90 Base) MCG/ACT inhaler, Inhale 2 puffs  into the lungs every 6 (six) hours as needed for wheezing or shortness of breath. (Patient not taking: Reported on 10/31/2022), Disp: 18 g, Rfl: 1   ALPRAZolam (XANAX) 0.5 MG tablet, Take 1/2-1 tab as needed every 4-6 hours as needed for severe anxiety and panic (Patient not taking: Reported on 07/11/2022), Disp: 30 tablet, Rfl: 0  EXAM: BP Readings from Last 3 Encounters:  10/31/22 115/75  04/04/22 140/72  03/23/22 (!) 145/84    VITALS per patient if applicable:  GENERAL: alert, oriented, appears well and in no acute distress ild congestion  on toxic nl speech  no cough during visit  HEENT: atraumatic, conjunttiva clear, no obvious abnormalities on inspection of external nose and ears  NECK: normal movements of the head and neck  LUNGS: on inspection no signs of respiratory distress, breathing rate appears normal, no obvious gross SOB, gasping or wheezing  CV: no obvious cyanosis  MS: moves all visible extremities without noticeable abnormality  PSYCH/NEURO: pleasant and cooperative, no obvious depression or anxiety, speech and thought processing grossly intact  Last gfr 64.49  last bg fasting 130 today and 160 PP one hour    ASSESSMENT AND PLAN:  Discussed the following assessment and plan:    ICD-10-CM   1. COVID-19  U07.1    immunized initial and 3 boosters ( total 5)   second infection no obv complicaitions    2. Medication management  Z79.899     3. Diabetes mellitus with coincident hypertension ([place])  E11.9    I10       Counseled.   Expectant management and discussion of plan and treatment with opportunity to ask questions and all were answered. The patient agreed with the plan and demonstrated an understanding of the instructions.   Advised to call back or seek an in-person evaluation if worsening  or having  further concerns  in interim. Return if symptoms worsen or fail to improve as expected.    Shanon Ace, MD

## 2022-11-01 NOTE — Telephone Encounter (Signed)
Patient was seen virtually with Dr. Regis Bill yesterday.

## 2022-11-15 ENCOUNTER — Ambulatory Visit: Payer: BC Managed Care – PPO | Admitting: Psychology

## 2022-11-15 DIAGNOSIS — F330 Major depressive disorder, recurrent, mild: Secondary | ICD-10-CM

## 2022-11-15 DIAGNOSIS — F4323 Adjustment disorder with mixed anxiety and depressed mood: Secondary | ICD-10-CM

## 2022-11-15 DIAGNOSIS — F902 Attention-deficit hyperactivity disorder, combined type: Secondary | ICD-10-CM

## 2022-11-15 NOTE — Progress Notes (Signed)
[facility] Counselor/Therapist Progress Note  Patient ID: Jonathan [name], MRN: [id number],    Date:11/15/2022  Time Spent: 60 minutes  Treatment Type: Individual Therapy  Reported Symptoms: sadness, inability to pay attention  Mental Status Exam: Appearance:  Casual     Behavior: Appropriate  Motor: Normal  Speech/Language:  Normal Rate  Affect: Appropriate  Mood: normal  Thought process: normal  Thought content:   WNL  Sensory/Perceptual disturbances:   WNL  Orientation: oriented to person, place, time/date, and situation  Attention: Good  Concentration: Good  Memory: WNL  Fund of knowledge:  Good  Insight:   Good  Judgment:  Good  Impulse Control: Good   Risk Assessment: Danger to Self:  No Self-injurious Behavior: No Danger to Others: No Duty to Warn:no Physical Aggression / Violence:No  Access to Firearms a concern: No  Gang Involvement:No   Subjective: The patient attended a face-to-face individual therapy session in the office today.  The patient presents as pleasant and cooperative.  The patient states that he and his family had covid around Thanksgiving.  The patient talked about struggling with aging and knowing when to slow down.  We discussed the importance of aging gracefully.  We talked about how to recognize the aging process and how resistance plays into making it more difficult.  The patient seemed to be able to take the information we discussed and has more insight into himself about this.  We will continue to work on how to help him with this process and also get back to talking more about making sure that he keeps his commitments.   Interventions: Cognitive Behavioral Therapy and Insight-Oriented  Diagnosis:Mild episode of recurrent major depressive disorder (HCC)  Attention deficit hyperactivity disorder (ADHD), combined type  Adjustment disorder with mixed anxiety and depressed mood  Plan: Client Abilities/Strengths   Intelligent, insightful, motivated  Client Treatment Preferences  Outpatient individual therapy  Client Statement of Needs  "I decided I needed therapy to help me sort through a situation I am dealing with"  Treatment Level  Outpatient Individual therapy  Symptoms  Increased defensiveness with his wife: (Status: improved). Passive -  aggressive behaviors: (Status: improved).  Problems Addressed  adjustment disorder, Vocational Stress, Vocational Stress, Vocational Stress, Vocational Stress,  Vocational Stress  Goals 1. Decrease incidents of exhibiting passive aggressive behavior Objective Learn and implement problem-solving skills. Target Date: 2023-09-16 Frequency: Monthly Progress: 90 Modality: individual Related Interventions 1. Conduct Problem-Solving Therapy (see Problem-Solving Therapy by Shawnee Knapp and Delane Ginger) using techniques such as psychoeducation, modeling, and role-playing to teach the client problem solving skills (i.e., defining a problem specifically, generating possible solutions, evaluating the  pros and cons of each solution, selecting and implementing a plan of action, evaluating the  efficacy of the plan, accepting or revising the plan); role-play application of the problemsolving skill to a real life issue (or assign "Applying Problem-Solving to Interpersonal Conflict" in the Adult Psychotherapy Homework Planner by Bryn Gulling). 2. Improve satisfaction and comfort surrounding coworker relationships. 3. Increase job satisfaction and performance due to implementation of  assertiveness and stress management strategies. 4. Increase sense of confidence and competence in dealing with work  responsibilities. Objective Verbalize healthy, realistic cognitive messages that promote harmony with others, self-acceptance, and  self-confidence. Target Date: 2023-09-16 Frequency: Monthly Progress: 90 Modality: individual 5. Increase sense of self-esteem and elevation of mood in  spite of  unemployment. 6. Pursue employment consistency with a reasonably hopeful and positive attitude. Diagnosis Adult ADD 296.31 (Major depressive affective disorder,  recurrent episode, mild) - 309.28 (Adjustment disorder with mixed anxiety and depressed mood) - Conditions For Discharge Achievement of treatment goals and objectives  The patient approved this plan and is making progress.  Weber Monnier G Irlene Crudup, LCSW

## 2022-12-08 ENCOUNTER — Telehealth: Payer: Self-pay | Admitting: Internal Medicine

## 2022-12-08 ENCOUNTER — Ambulatory Visit: Payer: BC Managed Care – PPO | Admitting: Psychology

## 2022-12-08 DIAGNOSIS — F330 Major depressive disorder, recurrent, mild: Secondary | ICD-10-CM | POA: Diagnosis not present

## 2022-12-08 DIAGNOSIS — F902 Attention-deficit hyperactivity disorder, combined type: Secondary | ICD-10-CM

## 2022-12-08 DIAGNOSIS — F4321 Adjustment disorder with depressed mood: Secondary | ICD-10-CM

## 2022-12-08 NOTE — Progress Notes (Signed)
[facility] Counselor/Therapist Progress Note  Patient ID: Jonathan [name], MRN: [id number],    Date:12/08/2022  Time Spent: 60 minutes  Treatment Type: Individual Therapy  Reported Symptoms: sadness, inability to pay attention  Mental Status Exam: Appearance:  Casual     Behavior: Appropriate  Motor: Normal  Speech/Language:  Normal Rate  Affect: Appropriate  Mood: depressed  Thought process: normal  Thought content:   WNL  Sensory/Perceptual disturbances:   WNL  Orientation: oriented to person, place, time/date, and situation  Attention: Good  Concentration: Good  Memory: WNL  Fund of knowledge:  Good  Insight:   Good  Judgment:  Good  Impulse Control: Good   Risk Assessment: Danger to Self:  No Self-injurious Behavior: No Danger to Others: No Duty to Warn:no Physical Aggression / Violence:No  Access to Firearms a concern: No  Gang Involvement:No   Subjective: The patient attended a face-to-face individual therapy session in the office today.  The patient presents with a blunted affect and mood is depressed.  The patient talked about all the things that he had going on for Christmas and it seems that there were numerous major events.  The patient reports that his mother had a stroke and she ended up passing away.  He also reports that while his mother was being put into hospice his wife had a health event and had to be hospitalized and put in intensive care.  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  The patient just wanted to vent today and we talked about how he is handling things.  We did some reframing and supportive therapy during this session.   Interventions: Cognitive Behavioral Therapy and Insight-Oriented  Diagnosis:Mild episode of recurrent major depressive disorder (HCC)  Attention deficit  hyperactivity disorder (ADHD), combined type  Grief  Plan: Client Abilities/Strengths  Intelligent, insightful, motivated  Client Treatment Preferences  Outpatient individual therapy  Client Statement of Needs  "I decided I needed therapy to help me sort through a situation I am dealing with"  Treatment Level  Outpatient Individual therapy  Symptoms  Increased defensiveness with his wife: (Status: improved). Passive -  aggressive behaviors: (Status: improved).  Problems Addressed  adjustment disorder, Vocational Stress, Vocational Stress, Vocational Stress, Vocational Stress,  Vocational Stress  Goals 1. Decrease incidents of exhibiting passive aggressive behavior Objective Learn and implement problem-solving skills. Target Date: 2023-09-16 Frequency: Monthly Progress: 90 Modality: individual Related Interventions 1. Conduct Problem-Solving Therapy (see Problem-Solving Therapy by Shawnee Knapp and Delane Ginger) using techniques such as psychoeducation, modeling, and role-playing to teach the client problem solving skills (i.e., defining a problem specifically, generating possible solutions, evaluating the  pros and cons of each solution, selecting and implementing a plan of action, evaluating the  efficacy of the plan, accepting or revising the plan); role-play application of the problemsolving skill to a real life issue (or assign "Applying Problem-Solving to Interpersonal Conflict" in the Adult Psychotherapy Homework Planner by Bryn Gulling). 2. Improve satisfaction and comfort surrounding coworker relationships. 3. Increase job satisfaction and performance due to implementation of  assertiveness and stress management strategies. 4. Increase sense of confidence and competence in dealing with work  responsibilities. Objective Verbalize healthy, realistic cognitive messages that promote harmony with others, self-acceptance, and  self-confidence. Target Date: 2023-09-16 Frequency:  Monthly Progress: 90 Modality: individual 5. Increase sense of  self-esteem and elevation of mood in spite of  unemployment. 6. Pursue employment consistency with a reasonably hopeful and positive attitude. Diagnosis Adult ADD 296.31 (Major depressive affective disorder, recurrent episode, mild) - 309.28 (Adjustment disorder with mixed anxiety and depressed mood) - Conditions For Discharge Achievement of treatment goals and objectives  The patient approved this plan and is making progress.  Charleton Deyoung G Mitchelle Sultan, LCSW

## 2022-12-08 NOTE — Telephone Encounter (Signed)
Information verified - please disregard

## 2022-12-13 ENCOUNTER — Other Ambulatory Visit: Payer: BC Managed Care – PPO

## 2022-12-20 ENCOUNTER — Encounter: Payer: BC Managed Care – PPO | Admitting: Internal Medicine

## 2022-12-29 ENCOUNTER — Ambulatory Visit: Payer: BC Managed Care – PPO | Admitting: Psychology

## 2022-12-29 DIAGNOSIS — F4321 Adjustment disorder with depressed mood: Secondary | ICD-10-CM

## 2022-12-29 DIAGNOSIS — F330 Major depressive disorder, recurrent, mild: Secondary | ICD-10-CM | POA: Diagnosis not present

## 2022-12-29 DIAGNOSIS — F902 Attention-deficit hyperactivity disorder, combined type: Secondary | ICD-10-CM | POA: Diagnosis not present

## 2022-12-29 NOTE — Progress Notes (Signed)
[facility] Counselor/Therapist Progress Note  Patient ID: Jonathan [name], MRN: [id number],    Date:12/29/2022  Time Spent: 60 minutes  Treatment Type: Individual Therapy  Reported Symptoms: sadness, inability to pay attention  Mental Status Exam: Appearance:  Casual     Behavior: Appropriate  Motor: Normal  Speech/Language:  Normal Rate  Affect: Appropriate  Mood: pleasant  Thought process: normal  Thought content:   WNL  Sensory/Perceptual disturbances:   WNL  Orientation: oriented to person, place, time/date, and situation  Attention: Good  Concentration: Good  Memory: WNL  Fund of knowledge:  Good  Insight:   Good  Judgment:  Good  Impulse Control: Good   Risk Assessment: Danger to Self:  No Self-injurious Behavior: No Danger to Others: No Duty to Warn:no Physical Aggression / Violence:No  Access to Firearms a concern: No  Gang Involvement:No   Subjective: The patient attended a face-to-face individual therapy session in the office today.  The patient presents with a blunted affect and mood is pleasant.  The patient reports that he is just feeling very tired now.  We talked about coming back to work and feeling like he just does not know how many more Sundays he has in him.  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  We talked about options as far as when he might be able to retire and because of all the changes that he has had in life over the last few months he is likely to be able to retire a little early in that might be welcomed.  We talked about how to continue to get through until the time where he can do that and I encouraged him to continue to take care of himself and find ways to on a regular basis.   Interventions: Cognitive Behavioral Therapy and Insight-Oriented  Diagnosis:Mild episode of recurrent major depressive disorder (HCC)  Attention deficit hyperactivity disorder (ADHD),  combined type  Grief  Plan: Client Abilities/Strengths  Intelligent, insightful, motivated  Client Treatment Preferences  Outpatient individual therapy  Client Statement of Needs  "I decided I needed therapy to help me sort through a situation I am dealing with"  Treatment Level  Outpatient Individual therapy  Symptoms  Increased defensiveness with his wife: (Status: improved). Passive -  aggressive behaviors: (Status: improved).  Problems Addressed  adjustment disorder, Vocational Stress, Vocational Stress, Vocational Stress, Vocational Stress,  Vocational Stress  Goals 1. Decrease incidents of exhibiting passive aggressive behavior Objective Learn and implement problem-solving skills. Target Date: 2023-09-16 Frequency: Monthly Progress: 90 Modality: individual Related Interventions 1. Conduct Problem-Solving Therapy (see Problem-Solving Therapy by Shawnee Knapp and Delane Ginger) using techniques such as psychoeducation, modeling, and role-playing to teach the client problem solving skills (i.e., defining a problem specifically, generating possible solutions, evaluating the  pros and cons of each solution, selecting and implementing a plan of action, evaluating the  efficacy of the plan, accepting or revising the plan); role-play application of the problemsolving skill to a real life issue (or assign "Applying Problem-Solving to Interpersonal Conflict" in the Adult Psychotherapy Homework Planner by Bryn Gulling). 2. Improve satisfaction and comfort surrounding coworker relationships. 3. Increase job satisfaction and performance due to implementation of  assertiveness and stress management strategies. 4. Increase sense of confidence and competence in dealing with work  responsibilities. Objective Verbalize healthy, realistic cognitive messages that promote harmony with others, self-acceptance, and  self-confidence. Target Date: 2023-09-16 Frequency: Monthly Progress: 90 Modality:  individual 5. Increase sense of self-esteem and elevation of mood in spite of  unemployment. 6. Pursue employment consistency with a reasonably hopeful and positive attitude. Diagnosis Adult ADD 296.31 (Major depressive affective disorder, recurrent episode, mild) - 309.28 (Adjustment disorder with mixed anxiety and depressed mood) - Conditions For Discharge Achievement of treatment goals and objectives  The patient approved this plan and is making progress.  Chasin Findling G Tashai Catino, LCSW

## 2023-01-11 ENCOUNTER — Ambulatory Visit: Payer: BC Managed Care – PPO | Admitting: Psychiatry

## 2023-01-11 ENCOUNTER — Encounter: Payer: Self-pay | Admitting: Psychiatry

## 2023-01-11 ENCOUNTER — Other Ambulatory Visit: Payer: Self-pay

## 2023-01-11 VITALS — BP 139/89 | HR 73

## 2023-01-11 DIAGNOSIS — F902 Attention-deficit hyperactivity disorder, combined type: Secondary | ICD-10-CM | POA: Diagnosis not present

## 2023-01-11 DIAGNOSIS — F330 Major depressive disorder, recurrent, mild: Secondary | ICD-10-CM

## 2023-01-11 MED ORDER — METFORMIN HCL ER 500 MG PO TB24: ORAL_TABLET | ORAL | Status: DC

## 2023-01-11 MED ORDER — BUPROPION HCL ER (XL) 150 MG PO TB24: 150.0000 mg | ORAL_TABLET | Freq: Every day | ORAL | 1 refills | Status: DC

## 2023-01-11 NOTE — Progress Notes (Signed)
ALPHA GEERS [id number] [birth year] 62 y.o.  Subjective:   Patient ID:  Jonathan [name] is a 62 y.o. (DOB June 05, 1961) male.  Chief Complaint:  Chief Complaint  Patient presents with   ADHD   Depression   Follow-up    Anxiety    HPI NOUMAN MALAGON presents to the office today for follow-up of ADHD, Anxiety, and depression.   Multiple recent stressors- Mother went into hospice 9-10 days before Christmas. He went to the [facility] area to be with his mother. While he was there, his wife went into ketoacidosis. He had to return home to help his wife in the hospital. He reports they spent Christmas in the hospital. Mother died Christmas day. They traveled to [place] eve for father-in-law's 90th birthday and the following week they had mother's funeral in [place]. He has had stress at work with new fiscal year. 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   He reports, "I need something for focus and energy." He reports feeling that compensatory strategies have not been sufficiently to manage symptoms recently. Energy has been low and he has been having difficulty with focus. His wife and daughter have commented that he has difficulty describing things in a succinct way. He reports that his BP has improved with less medication. He recalls Concerta being helpful for concentration and energy. Motivation is somewhat low. He reports that family has made dietary changes after wife had ketoacidosis. He reports some depression- "even more than grief" and that he and his mother had some good moments. He denies any recent acute anxiety or need to take Xanax prn. Denies SI.   Past Psychiatric Medication Trials: Citalopram- Sexual side effects, fatigue. Partially effective for depression. Increased to 15 mg in the last month and noticed more fatigue. He reports that he has been taking 10 mg most of the time since then.  Trintellix- Improved mood, anxiety, and focus on 5 mg. Excessive somnolence on 10  mg. Pristiq Wellbutrin- Took 25 years ago. Cannot recall response. Concerta Modafinil-Helpful for concentration. Caused some edginess  PHQ2-9    Flowsheet Row Video Visit from 07/11/2022 in [place] at [place] from 05/05/2021 in [place] at [place] from 10/19/2020 in [place] at [place] from 08/02/2019 in [place] at [place] from 10/01/2018 in [place] at [place]  PHQ-2 Total Score 3 2 0 4 0  PHQ-9 Total Score 12 12 9 15 $ --      Flowsheet Row ED from 03/21/2022 in [facility] at [facility] ED from 03/17/2022 in [facility] at [place] [facility] ED from 07/16/2021 in [facility] at [place] [facility])  [place] No Risk No Risk Error: Question 6 not populated        Review of Systems:  Review of Systems  Endocrine:       He reports good glycemic control  Musculoskeletal:  Negative for gait problem.  Neurological:  Negative for tremors.  Psychiatric/Behavioral:         Please refer to HPI    Medications: I have reviewed the patient's current medications.  Current Outpatient Medications  Medication Sig Dispense Refill   buPROPion (WELLBUTRIN XL) 150 MG 24 hr tablet Take 1 tablet (150 mg total) by mouth daily. 30 tablet 1   losartan-hydrochlorothiazide (HYZAAR) 50-12.5 MG tablet TAKE 2 TABLETS DAILY 180 tablet 3  rosuvastatin (CRESTOR) 5 MG tablet Take 5 mg  by mouth 5 days per week for 2 weeks  and then increase to  5 mg 7 days per  week as tolerated (Patient taking differently: daily.) 90 tablet 0   Vilazodone HCl 20 MG TABS Take 1 tablet (20 mg total) by mouth daily with breakfast AND 0.5 tablets (10 mg total) daily with lunch. 135 tablet 1   albuterol (VENTOLIN HFA) 108 (90 Base) MCG/ACT inhaler Inhale 2 puffs into the lungs  every 6 (six) hours as needed for wheezing or shortness of breath. (Patient not taking: Reported on 10/31/2022) 18 g 1   ALPRAZolam (XANAX) 0.5 MG tablet Take 1/2-1 tab as needed every 4-6 hours as needed for severe anxiety and panic (Patient not taking: Reported on 07/11/2022) 30 tablet 0   B Complex Vitamins (VITAMIN B COMPLEX PO) Take by mouth.     fluticasone (FLONASE) 50 MCG/ACT nasal spray Place 1 spray into both nostrils daily. Begin by using 2 sprays in each nare daily for 3 to 5 days, then decrease to 1 spray in each nare daily. 32 mL 1   metFORMIN (GLUCOPHAGE-XR) 500 MG 24 hr tablet Take by mouth  3 per day as directed ( total 1531m) 270 tablet `   Omega-3 Fatty Acids (FISH OIL PO) Take by mouth.     Probiotic Product (PROBIOTIC PO) Take 1 tablet by mouth every other day.     VITAMIN E COMPLEX PO Take by mouth.     No current facility-administered medications for this visit.    Medication Side Effects: Other: Occ diarrhea  Allergies:  Allergies  Allergen Reactions   Statins Other (See Comments)    myalgias     Zocor [Simvastatin]      myalgias   Penicillins Hives    Per pt - happened in childhood - hives possible reaction    Past Medical History:  Diagnosis Date   Allergic rhinitis    Allergy    Anxiety    Depression    Diabetes mellitus without complication (HCC)    type 2   Heart murmur    per pt told it was functional   History of kidney stones    History of nephrolithiasis    Hyperlipidemia    Hypertension    Sebaceous cyst     Past Medical History, Surgical history, Social history, and Family history were reviewed and updated as appropriate.   Please see review of systems for further details on the patient's review from today.   Objective:   Physical Exam:  BP 139/89   Pulse 73   Physical Exam Constitutional:      General: He is not in acute distress. Musculoskeletal:        General: No deformity.  Neurological:     Mental Status: He is alert  and oriented to person, place, and time.     Coordination: Coordination normal.  Psychiatric:        Attention and Perception: Attention and perception normal. He does not perceive auditory or visual hallucinations.        Mood and Affect: Affect is not labile, blunt, angry or inappropriate.        Speech: Speech normal.        Behavior: Behavior normal.        Thought Content: Thought content normal. Thought content is not paranoid or delusional. Thought content does not include homicidal or suicidal ideation. Thought content does not include homicidal or suicidal  plan.        Cognition and Memory: Cognition and memory normal.        Judgment: Judgment normal.     Comments: Insight intact Mood is appropriate to content. Affect is congruent. Mood is somewhat sad.     Lab Review:     Component Value Date/Time   NA 141 03/30/2022 0747   NA 141 08/17/2021 0000   K 4.7 03/30/2022 0747   CL 101 03/30/2022 0747   CO2 30 03/30/2022 0747   GLUCOSE 141 (H) 03/30/2022 0747   BUN 16 03/30/2022 0747   CREATININE 1.22 03/30/2022 0747   CREATININE 1.15 10/19/2020 1206   CALCIUM 9.3 03/30/2022 0747   PROT 6.5 03/30/2022 0747   ALBUMIN 4.1 03/30/2022 0747   AST 18 03/30/2022 0747   ALT 27 03/30/2022 0747   ALKPHOS 65 03/30/2022 0747   BILITOT 0.7 03/30/2022 0747   GFRNONAA 72.1 08/17/2021 0000   GFRNONAA 68 10/19/2020 1206   GFRAA 83.5 08/17/2021 0000   GFRAA 79 10/19/2020 1206       Component Value Date/Time   WBC 9.6 03/30/2022 0747   RBC 5.22 03/30/2022 0747   HGB 16.3 03/30/2022 0747   HCT 48.4 03/30/2022 0747   PLT 358.0 03/30/2022 0747   MCV 92.8 03/30/2022 0747   MCV 94.2 04/29/2013 0936   MCH 30.9 10/19/2020 1206   MCHC 33.7 03/30/2022 0747   RDW 13.3 03/30/2022 0747   LYMPHSABS 2.3 03/30/2022 0747   MONOABS 0.9 03/30/2022 0747   EOSABS 0.2 03/30/2022 0747   BASOSABS 0.1 03/30/2022 0747    No results found for: "POCLITH", "LITHIUM"   No results found for:  "PHENYTOIN", "PHENOBARB", "VALPROATE", "CBMZ"   .res Assessment: Plan:    Discussed treatment options to improve concentration, low mood, and low energy. Discussed option of Wellbutrin XL since it is used off-label for ADHD and typically is helpful for low energy and motivation. Discussed that he tolerated it in the past years ago and is therefore likely to tolerate it again, particularly in combination with Viibryd that has been helpful for his anxiety symptoms. Pt agrees to trial of Wellbutrin XL 150 mg po qd.  Will continue Viibryd 20 mg in the morning and 10 mg mid-day for anxiety and depression.  Will continue Alprazolam as needed for severe anxiety. Pt reports that he does not need a new script at this time since he has not needed Alprazolam recently.  Pt to follow-up with this provider in 4-6 weeks or sooner if clinically indicated.  Recommend continuing therapy with Bambi Cottle, LCSW.  Patient advised to contact office with any questions, adverse effects, or acute worsening in signs and symptoms.   Micheal was seen today for adhd, depression and follow-up.  Diagnoses and all orders for this visit:  Mild episode of recurrent major depressive disorder (HCC) -     buPROPion (WELLBUTRIN XL) 150 MG 24 hr tablet; Take 1 tablet (150 mg total) by mouth daily.  Attention deficit hyperactivity disorder (ADHD), combined type -     buPROPion (WELLBUTRIN XL) 150 MG 24 hr tablet; Take 1 tablet (150 mg total) by mouth daily.     Please see After Visit Summary for patient specific instructions.  Future Appointments  Date Time Provider [place]  01/17/2023  8:00 AM Cottle, Bambi G, LCSW [facility] None  01/23/2023  9:00 AM [facility]  01/24/2023 11:00 AM Panosh, Standley Brooking, MD [facility]  02/16/2023  9:00 AM Thayer Headings, PMHNP  [facility] None  02/23/2023  8:00 AM Cottle, Bambi G, LCSW [facility] None    No orders of the defined types were placed in this  encounter.   -------------------------------

## 2023-01-17 ENCOUNTER — Ambulatory Visit: Payer: BC Managed Care – PPO | Admitting: Psychology

## 2023-01-17 DIAGNOSIS — F4321 Adjustment disorder with depressed mood: Secondary | ICD-10-CM

## 2023-01-17 DIAGNOSIS — F330 Major depressive disorder, recurrent, mild: Secondary | ICD-10-CM | POA: Diagnosis not present

## 2023-01-17 DIAGNOSIS — F902 Attention-deficit hyperactivity disorder, combined type: Secondary | ICD-10-CM

## 2023-01-18 NOTE — Progress Notes (Signed)
[place] Counselor/Therapist Progress Note  Patient ID: Jonathan [name], MRN: [id number],    Date:01/17/2023  Time Spent: 60 minutes  Treatment Type: Individual Therapy  Reported Symptoms: sadness, inability to pay attention  Mental Status Exam: Appearance:  Casual     Behavior: Appropriate  Motor: Normal  Speech/Language:  Normal Rate  Affect: Appropriate  Mood: pleasant  Thought process: normal  Thought content:   WNL  Sensory/Perceptual disturbances:   WNL  Orientation: oriented to person, place, time/date, and situation  Attention: Good  Concentration: Good  Memory: WNL  Fund of knowledge:  Good  Insight:   Good  Judgment:  Good  Impulse Control: Good   Risk Assessment: Danger to Self:  No Self-injurious Behavior: No Danger to Others: No Duty to Warn:no Physical Aggression / Violence:No  Access to Firearms a concern: No  Gang Involvement:No   Subjective: The patient attended a face-to-face individual therapy session in the office today.  The patient presents with a blunted affect and mood is pleasant.  The patient reports that his wife's father is in the hospital now.  He states that he is so tired of things happening so close together.  We talked about how he is taking care of himself even with all these things happening.  We talked about him getting back on a regular routine which would be something he would have to think about.  I encouraged him to get back into doing his morning papers.  The patient did report that he went to see his medication provider and they added Wellbutrin to his Viibryd.  The patient seems to be doing better with this combination at present.  Interventions: Cognitive Behavioral Therapy and Insight-Oriented  Diagnosis:Mild episode of recurrent major depressive disorder (HCC)  Attention deficit hyperactivity disorder (ADHD), combined type  Grief  Plan: Client Abilities/Strengths  Intelligent, insightful,  motivated  Client Treatment Preferences  Outpatient individual therapy  Client Statement of Needs  "I decided I needed therapy to help me sort through a situation I am dealing with"  Treatment Level  Outpatient Individual therapy  Symptoms  Increased defensiveness with his wife: (Status: improved). Passive -  aggressive behaviors: (Status: improved).  Problems Addressed  adjustment disorder, Vocational Stress, Vocational Stress, Vocational Stress, Vocational Stress,  Vocational Stress  Goals 1. Decrease incidents of exhibiting passive aggressive behavior Objective Learn and implement problem-solving skills. Target Date: 2023-09-16 Frequency: Monthly Progress: 90 Modality: individual Related Interventions 1. Conduct Problem-Solving Therapy (see Problem-Solving Therapy by Shawnee Knapp and Delane Ginger) using techniques such as psychoeducation, modeling, and role-playing to teach the client problem solving skills (i.e., defining a problem specifically, generating possible solutions, evaluating the  pros and cons of each solution, selecting and implementing a plan of action, evaluating the  efficacy of the plan, accepting or revising the plan); role-play application of the problemsolving skill to a real life issue (or assign "Applying Problem-Solving to Interpersonal Conflict" in the Adult Psychotherapy Homework Planner by Bryn Gulling). 2. Improve satisfaction and comfort surrounding coworker relationships. 3. Increase job satisfaction and performance due to implementation of  assertiveness and stress management strategies. 4. Increase sense of confidence and competence in dealing with work  responsibilities. Objective Verbalize healthy, realistic cognitive messages that promote harmony with others, self-acceptance, and  self-confidence. Target Date: 2023-09-16 Frequency: Monthly Progress: 90 Modality: individual 5. Increase sense of self-esteem and elevation of mood in spite of  unemployment. 6.  Pursue employment consistency with a reasonably hopeful and positive attitude. Diagnosis  Adult ADD 296.31 (Major depressive affective disorder, recurrent episode, mild) - 309.28 (Adjustment disorder with mixed anxiety and depressed mood) - Conditions For Discharge Achievement of treatment goals and objectives  The patient approved this plan and is making progress.  Jonathan [name] G Treg Diemer, LCSW

## 2023-01-23 ENCOUNTER — Other Ambulatory Visit: Payer: BC Managed Care – PPO

## 2023-01-23 ENCOUNTER — Other Ambulatory Visit (INDEPENDENT_AMBULATORY_CARE_PROVIDER_SITE_OTHER): Payer: BC Managed Care – PPO

## 2023-01-23 DIAGNOSIS — E119 Type 2 diabetes mellitus without complications: Secondary | ICD-10-CM | POA: Diagnosis not present

## 2023-01-23 DIAGNOSIS — Z125 Encounter for screening for malignant neoplasm of prostate: Secondary | ICD-10-CM

## 2023-01-23 DIAGNOSIS — Z0000 Encounter for general adult medical examination without abnormal findings: Secondary | ICD-10-CM

## 2023-01-23 DIAGNOSIS — E785 Hyperlipidemia, unspecified: Secondary | ICD-10-CM | POA: Diagnosis not present

## 2023-01-23 DIAGNOSIS — I10 Essential (primary) hypertension: Secondary | ICD-10-CM

## 2023-01-23 DIAGNOSIS — Z79899 Other long term (current) drug therapy: Secondary | ICD-10-CM

## 2023-01-23 LAB — BASIC METABOLIC PANEL
BUN: 13 mg/dL (ref 6–23)
CO2: 28 mEq/L (ref 19–32)
Calcium: 9.7 mg/dL (ref 8.4–10.5)
Chloride: 103 mEq/L (ref 96–112)
Creatinine, Ser: 1.31 mg/dL (ref 0.40–1.50)
GFR: 57.96 mL/min — ABNORMAL LOW (ref >60.00)
Glucose, Bld: 117 mg/dL — ABNORMAL HIGH (ref 70–99)
Potassium: 4.2 mEq/L (ref 3.5–5.1)
Sodium: 140 mEq/L (ref 135–145)

## 2023-01-23 LAB — CBC WITH DIFFERENTIAL/PLATELET
Basophils Absolute: 0.1 10*3/uL (ref 0.0–0.1)
Basophils Relative: 0.7 % (ref 0.0–3.0)
Eosinophils Absolute: 0.6 10*3/uL (ref 0.0–0.7)
Eosinophils Relative: 5.7 % — ABNORMAL HIGH (ref 0.0–5.0)
HCT: 48.6 % (ref 39.0–52.0)
Hemoglobin: 16.7 g/dL (ref 13.0–17.0)
Lymphocytes Relative: 26.7 % (ref 12.0–46.0)
Lymphs Abs: 2.7 10*3/uL (ref 0.7–4.0)
MCHC: 34.4 g/dL (ref 30.0–36.0)
MCV: 90.7 fl (ref 78.0–100.0)
Monocytes Absolute: 0.8 10*3/uL (ref 0.1–1.0)
Monocytes Relative: 7.4 % (ref 3.0–12.0)
Neutro Abs: 6.1 10*3/uL (ref 1.4–7.7)
Neutrophils Relative %: 59.5 % (ref 43.0–77.0)
Platelets: 246 10*3/uL (ref 150.0–400.0)
RBC: 5.36 Mil/uL (ref 4.22–5.81)
RDW: 13 % (ref 11.5–15.5)
WBC: 10.3 10*3/uL (ref 4.0–10.5)

## 2023-01-23 LAB — HEMOGLOBIN A1C: Hgb A1c MFr Bld: 6.4 % (ref 4.6–6.5)

## 2023-01-23 LAB — LIPID PANEL
Cholesterol: 132 mg/dL (ref 0–200)
HDL: 48.7 mg/dL (ref >39.00)
LDL Cholesterol: 66 mg/dL (ref 0–99)
NonHDL: 82.97
Total CHOL/HDL Ratio: 3
Triglycerides: 87 mg/dL (ref 0.0–149.0)
VLDL: 17.4 mg/dL (ref 0.0–40.0)

## 2023-01-23 LAB — HEPATIC FUNCTION PANEL
ALT: 22 U/L (ref 0–53)
AST: 21 U/L (ref 0–37)
Albumin: 4.5 g/dL (ref 3.5–5.2)
Alkaline Phosphatase: 56 U/L (ref 39–117)
Bilirubin, Direct: 0.2 mg/dL (ref 0.0–0.3)
Total Bilirubin: 0.8 mg/dL (ref 0.2–1.2)
Total Protein: 6.6 g/dL (ref 6.0–8.3)

## 2023-01-23 LAB — MICROALBUMIN / CREATININE URINE RATIO
Creatinine,U: 113.8 mg/dL
Microalb Creat Ratio: 1.7 mg/g (ref 0.0–30.0)
Microalb, Ur: 1.9 mg/dL (ref 0.0–1.9)

## 2023-01-23 LAB — PSA: PSA: 0.84 ng/mL (ref 0.10–4.00)

## 2023-01-23 LAB — TSH: TSH: 1.56 u[IU]/mL (ref 0.35–5.50)

## 2023-01-23 NOTE — Progress Notes (Signed)
Labs stable or improved  will discuss at visit this week

## 2023-01-23 NOTE — Progress Notes (Unsigned)
Chief Complaint  Patient presents with   Annual Exam    HPI: Patient  Jonathan [name]  62 y.o. comes in today for Preventive Health Care visit   BP took off med when low  when traveled    First day just   restarted . So off med for  over a month.  Vibryd   trying to help.  That could help.  Was on concerta at some time.  But had inc BP with this.  Wellbutrin   ? If helped .   Knees :  climibing stairs  left more than right.   hard to  pop   infra ptellar   better recently.  Health Maintenance  Topic Date Due   HIV Screening  Never done   Zoster Vaccines- Shingrix (1 of 2) Never done   DTaP/Tdap/Td (2 - Tdap) 03/31/2020   FOOT EXAM  10/19/2021   OPHTHALMOLOGY EXAM  06/11/2022   INFLUENZA VACCINE  04/24/2023 (Originally 07/05/2022)   COVID-19 Vaccine (4 - 2023-24 season) 04/24/2023 (Originally 08/05/2022)   HEMOGLOBIN A1C  07/24/2023   Diabetic kidney evaluation - eGFR measurement  01/24/2024   Diabetic kidney evaluation - Urine ACR  01/24/2024   COLONOSCOPY (Pts 45-83yr Insurance coverage will need to be confirmed)  03/04/2024   Hepatitis C Screening  Completed   HPV VACCINES  Aged Out   Health Maintenance Review LIFESTYLE:  Exercise:   Tobacco/ETS: Alcohol:  Sugar beverages:  Sleep: Drug use: no HH of  Work:    ROS:  GEN/ HEENT: No fever, significant weight changes sweats headaches vision problems hearing changes, CV/ PULM; No chest pain shortness of breath cough, syncope,edema  change in exercise tolerance. GI /GU: No adominal pain, vomiting, change in bowel habits. No blood in the stool. No significant GU symptoms. SKIN/HEME: ,no acute skin rashes suspicious lesions or bleeding. No lymphadenopathy, nodules, masses.  NEURO/ PSYCH:  No neurologic signs such as weakness numbness. No depression anxiety. IMM/ Allergy: No unusual infections.  Allergy .   REST of 12 system review negative except as per HPI   Past Medical History:  Diagnosis Date   Allergic  rhinitis    Allergy    Anxiety    Depression    Diabetes mellitus without complication ([place]    type 2   Heart murmur    per pt told it was functional   History of kidney stones    History of nephrolithiasis    Hyperlipidemia    Hypertension    Sebaceous cyst     Past Surgical History:  Procedure Laterality Date   COLONOSCOPY  07/2010   KDeatra Ina  CYSTOSCOPY/URETEROSCOPY/HOLMIUM LASER/STENT PLACEMENT Left 01/15/2020   Procedure: CYSTOSCOPY/URETEROSCOPY/HOLMIUM LASER/STENT PLACEMENT;  Surgeon: WCeasar Mons MD;  [facility]  Service: Urology;  Laterality: Left;   kidney stones removed  cystoscopy   x 2   TONSILLECTOMY     x 2   TYMPANOSTOMY TUBE PLACEMENT  as child    Family History  Problem Relation Age of Onset   Diabetes Father    Hypertension Father    Kidney cancer Father        now on dialysis   Kidney Stones Father    Kidney Stones Brother    Huntington's disease Maternal Grandfather    Depression Daughter    Anxiety disorder Daughter    ADD / ADHD Daughter    Colon polyps Daughter 271  Colon cancer Neg Hx  Esophageal cancer Neg Hx    Stomach cancer Neg Hx    Rectal cancer Neg Hx     Social History   Socioeconomic History   Marital status: Married    Spouse name: Not on file   Number of children: 1   Years of education: Not on file   Highest education level: Not on file  Occupational History   Occupation: MINISTER    Employer: [employer]  Tobacco Use   Smoking status: Never   Smokeless tobacco: Never  Vaping Use   Vaping Use: Never used  Substance and Sexual Activity   Alcohol use: Yes    Comment: less than 1 a week   Drug use: No   Sexual activity: Yes  Other Topics Concern   Not on file  Social History Narrative   Married   Regular exercise- no   HH of 3   Dog car and lizard   Sleep wakening at times   New job [employer]   Per week reg sleep.    Social Determinants of Health    Financial Resource Strain: Not on file  Food Insecurity: Not on file  Transportation Needs: Not on file  Physical Activity: Not on file  Stress: Not on file  Social Connections: Not on file    Outpatient Medications Prior to Visit  Medication Sig Dispense Refill   B Complex Vitamins (VITAMIN B COMPLEX PO) Take by mouth.     buPROPion (WELLBUTRIN XL) 150 MG 24 hr tablet Take 1 tablet (150 mg total) by mouth daily. 30 tablet 1   fluticasone (FLONASE) 50 MCG/ACT nasal spray Place 1 spray into both nostrils daily. Begin by using 2 sprays in each nare daily for 3 to 5 days, then decrease to 1 spray in each nare daily. 32 mL 1   losartan-hydrochlorothiazide (HYZAAR) 50-12.5 MG tablet TAKE 2 TABLETS DAILY 180 tablet 3   metFORMIN (GLUCOPHAGE-XR) 500 MG 24 hr tablet Take by mouth  3 per day as directed ( total 1540m) 270 tablet `   Omega-3 Fatty Acids (FISH OIL PO) Take by mouth.     Probiotic Product (PROBIOTIC PO) Take 1 tablet by mouth every other day.     rosuvastatin (CRESTOR) 5 MG tablet Take 5 mg  by mouth 5 days per week for 2 weeks  and then increase to  5 mg 7 days per  week as tolerated (Patient taking differently: daily.) 90 tablet 0   Vilazodone HCl 20 MG TABS Take 1 tablet (20 mg total) by mouth daily with breakfast AND 0.5 tablets (10 mg total) daily with lunch. 135 tablet 1   ALPRAZolam (XANAX) 0.5 MG tablet Take 1/2-1 tab as needed every 4-6 hours as needed for severe anxiety and panic (Patient not taking: Reported on 07/11/2022) 30 tablet 0   albuterol (VENTOLIN HFA) 108 (90 Base) MCG/ACT inhaler Inhale 2 puffs into the lungs every 6 (six) hours as needed for wheezing or shortness of breath. (Patient not taking: Reported on 10/31/2022) 18 g 1   VITAMIN E COMPLEX PO Take by mouth.     No facility-administered medications prior to visit.     EXAM:  BP (!) 154/88 (BP Location: Right Arm, Patient Position: Sitting, Cuff Size: Large)   Pulse 88   Temp 98.2 F (36.8 C) (Oral)    Ht 5' 6.5" (1.689 m)   Wt 204 lb 3.2 oz (92.6 kg)   SpO2 95%   BMI 32.46 kg/m   Body mass  index is 32.46 kg/m. Wt Readings from Last 3 Encounters:  01/24/23 204 lb 3.2 oz (92.6 kg)  04/04/22 210 lb (95.3 kg)  03/23/22 204 lb (92.5 kg)    Physical Exam: Vital signs reviewed [id number] is a well-developed well-nourished alert cooperative    who appearsr stated age in no acute distress.  HEENT: normocephalic atraumatic , Eyes: PERRL EOM's full, conjunctiva clear, Nares: paten,t no deformity discharge or tenderness., Ears: no deformity EAC's clear TMs with normal landmarks. Mouth: clear OP, no lesions, edema.  Moist mucous membranes. Dentition in adequate repair. NECK: supple without masses, thyromegaly or bruits. CHEST/PULM:  Clear to auscultation and percussion breath sounds equal no wheeze , rales or rhonchi. No chest wall deformities or tenderness. Breast: normal by inspection . No dimpling, discharge, masses, tenderness or discharge . CV: PMI is nondisplaced, S1 S2 no gallops, murmurs, rubs. Peripheral pulses are full without delay.No JVD .  ABDOMEN: Bowel sounds normal nontender  No guard or rebound, no hepato splenomegal no CVA tenderness.  No hernia. Extremtities:  No clubbing cyanosis or edema, no acute joint swelling or redness no focal atrophy NEURO:  Oriented x3, cranial nerves 3-12 appear to be intact, no obvious focal weakness,gait within normal limits no abnormal reflexes or asymmetrical SKIN: No acute rashes normal turgor, color, no bruising or petechiae. PSYCH: Oriented, good eye contact, no obvious depression anxiety, cognition and judgment appear normal. LN: no cervical axillary inguinal adenopathy  Lab Results  Component Value Date   WBC 10.3 01/23/2023   HGB 16.7 01/23/2023   HCT 48.6 01/23/2023   PLT 246.0 01/23/2023   GLUCOSE 117 (H) 01/23/2023   CHOL 132 01/23/2023   TRIG 87.0 01/23/2023   HDL 48.70 01/23/2023   LDLDIRECT 209.0 03/30/2022   LDLCALC 66  01/23/2023   ALT 22 01/23/2023   AST 21 01/23/2023   NA 140 01/23/2023   K 4.2 01/23/2023   CL 103 01/23/2023   CREATININE 1.31 01/23/2023   BUN 13 01/23/2023   CO2 28 01/23/2023   TSH 1.56 01/23/2023   PSA 0.84 01/23/2023   HGBA1C 6.4 01/23/2023   MICROALBUR 1.9 01/23/2023    BP Readings from Last 3 Encounters:  01/24/23 (!) 154/88  10/31/22 115/75  04/04/22 140/72    Lab results reviewed with patient   ASSESSMENT AND PLAN:  Discussed the following assessment and plan:    ICD-10-CM   1. Visit for preventive health examination  Z00.00     2. Essential hypertension  I10     3. Medication management  Z79.899     4. Hyperlipidemia, unspecified hyperlipidemia type  E78.5     5. Diabetes mellitus with coincident hypertension ([place])  E11.9    I10     6. Complaints of memory disturbance  R41.3     Ok to  get flonase otc . Return for 4 months or there abouts. .  Patient Care Team: Burnis Medin, MD as PCP - General Thayer Headings, PMHNP as Nurse Practitioner (Psychiatry) Ceasar Mons, MD as Consulting Physician (Urology) Patient Instructions  Good to see  you today.  Ldl is at goal  A1c much better.  Bp :try the 1 per day to see if controls bp and send in readings after a month .  If needed we can increase doseing Can do neuro consult  consider neuro psych  eval. Get with your  psych prescriber about the memory and mood meds as dicussed  Consider seeing  sports medicine .  Will  do referral  aboaut the knee popping up stairs.  Standley Brooking. Keimani Laufer M.D.

## 2023-01-24 ENCOUNTER — Encounter: Payer: Self-pay | Admitting: Internal Medicine

## 2023-01-24 ENCOUNTER — Ambulatory Visit (INDEPENDENT_AMBULATORY_CARE_PROVIDER_SITE_OTHER): Payer: BC Managed Care – PPO | Admitting: Internal Medicine

## 2023-01-24 VITALS — BP 160/98 | HR 88 | Temp 98.2°F | Ht 66.5 in | Wt 204.2 lb

## 2023-01-24 DIAGNOSIS — E785 Hyperlipidemia, unspecified: Secondary | ICD-10-CM | POA: Diagnosis not present

## 2023-01-24 DIAGNOSIS — Z0000 Encounter for general adult medical examination without abnormal findings: Secondary | ICD-10-CM | POA: Diagnosis not present

## 2023-01-24 DIAGNOSIS — M25562 Pain in left knee: Secondary | ICD-10-CM

## 2023-01-24 DIAGNOSIS — Z79899 Other long term (current) drug therapy: Secondary | ICD-10-CM

## 2023-01-24 DIAGNOSIS — I10 Essential (primary) hypertension: Secondary | ICD-10-CM

## 2023-01-24 DIAGNOSIS — R29898 Other symptoms and signs involving the musculoskeletal system: Secondary | ICD-10-CM

## 2023-01-24 DIAGNOSIS — R413 Other amnesia: Secondary | ICD-10-CM

## 2023-01-24 DIAGNOSIS — M25561 Pain in right knee: Secondary | ICD-10-CM

## 2023-01-24 DIAGNOSIS — E119 Type 2 diabetes mellitus without complications: Secondary | ICD-10-CM | POA: Diagnosis not present

## 2023-01-24 NOTE — Patient Instructions (Addendum)
Good to see  you today.  Ldl is at goal  A1c much better.  Bp :try the 1 per day to see if controls bp and send in readings after a month .  If needed we can increase doseing Can do neuro consult  consider neuro psych  eval. Get with your  psych prescriber about the memory and mood meds as dicussed  Consider seeing  sports medicine .  Will do referral  aboaut the knee popping up stairs.

## 2023-01-27 ENCOUNTER — Telehealth: Payer: Self-pay | Admitting: Internal Medicine

## 2023-01-27 ENCOUNTER — Other Ambulatory Visit: Payer: Self-pay | Admitting: Family

## 2023-01-27 MED ORDER — METFORMIN HCL ER 500 MG PO TB24: ORAL_TABLET | ORAL | Status: DC

## 2023-01-27 NOTE — Telephone Encounter (Signed)
Pt requesting refill of metFORMIN (GLUCOPHAGE-XR) 500 MG 24 hr tablet  [id number] [place] [place]: [phone] Fax: [phone]   Leaving for vacation 01/28/23

## 2023-02-01 ENCOUNTER — Encounter: Payer: Self-pay | Admitting: Family Medicine

## 2023-02-02 ENCOUNTER — Other Ambulatory Visit: Payer: Self-pay | Admitting: Psychiatry

## 2023-02-02 DIAGNOSIS — F330 Major depressive disorder, recurrent, mild: Secondary | ICD-10-CM

## 2023-02-02 DIAGNOSIS — F902 Attention-deficit hyperactivity disorder, combined type: Secondary | ICD-10-CM

## 2023-02-08 ENCOUNTER — Other Ambulatory Visit: Payer: Self-pay | Admitting: Psychiatry

## 2023-02-08 DIAGNOSIS — F902 Attention-deficit hyperactivity disorder, combined type: Secondary | ICD-10-CM

## 2023-02-08 DIAGNOSIS — F330 Major depressive disorder, recurrent, mild: Secondary | ICD-10-CM

## 2023-02-10 ENCOUNTER — Other Ambulatory Visit: Payer: Self-pay | Admitting: Internal Medicine

## 2023-02-13 DIAGNOSIS — M5414 Radiculopathy, thoracic region: Secondary | ICD-10-CM | POA: Diagnosis not present

## 2023-02-13 DIAGNOSIS — M9904 Segmental and somatic dysfunction of sacral region: Secondary | ICD-10-CM | POA: Diagnosis not present

## 2023-02-13 DIAGNOSIS — M9902 Segmental and somatic dysfunction of thoracic region: Secondary | ICD-10-CM | POA: Diagnosis not present

## 2023-02-13 DIAGNOSIS — M9903 Segmental and somatic dysfunction of lumbar region: Secondary | ICD-10-CM | POA: Diagnosis not present

## 2023-02-14 ENCOUNTER — Ambulatory Visit: Payer: BC Managed Care – PPO | Admitting: Physician Assistant

## 2023-02-14 ENCOUNTER — Encounter: Payer: Self-pay | Admitting: Physician Assistant

## 2023-02-14 ENCOUNTER — Other Ambulatory Visit (INDEPENDENT_AMBULATORY_CARE_PROVIDER_SITE_OTHER): Payer: BC Managed Care – PPO

## 2023-02-14 VITALS — BP 188/96 | HR 82 | Resp 18 | Ht 66.5 in | Wt 205.0 lb

## 2023-02-14 DIAGNOSIS — R413 Other amnesia: Secondary | ICD-10-CM

## 2023-02-14 LAB — VITAMIN B12: Vitamin B-12: 425 pg/mL (ref 211–911)

## 2023-02-14 NOTE — Patient Instructions (Addendum)
It was a pleasure to see you today at our office.   Recommendations:  Neurocognitive evaluation MRI of the brain, the radiology office will call you to arrange you appointment Check labs today Follow up in 1 month   Whom to call:  Memory  decline, memory medications: Call our office [phone]   For psychiatric meds, mood meds: Please have your primary care physician manage these medications.   Counseling regarding caregiver distress, including caregiver depression, anxiety and issues regarding community resources, adult day care programs, adult living facilities, or memory care questions:   Feel free to contact [place], Social Worker at [phone]   For assessment of decision of mental capacity and competency:  Call Dr. Anthoney Harada, geriatric psychiatrist at [phone]  For guidance in geriatric dementia issues please call [facility] [phone]   If you have any severe symptoms of a stroke, or other severe issues such as confusion,severe chills or fever, etc call 911 or go to the ER as you may need to be evaluated further   Feel free to visit Facebook page " [employer]" for tips of how to care for people with memory problems.   Feel free to go to the following database for funded clinical studies conducted around the world: http://saunders.com/   https://www.triadclinicaltrials.com/     RECOMMENDATIONS FOR ALL PATIENTS WITH MEMORY PROBLEMS: 1. Continue to exercise (Recommend 30 minutes of walking everyday, or 3 hours every week) 2. Increase social interactions - continue going to [place] and enjoy social gatherings with friends and family 3. Eat healthy, avoid fried foods and eat more fruits and vegetables 4. Maintain adequate blood pressure, blood sugar, and blood cholesterol level. Reducing the risk of stroke and cardiovascular disease also helps promoting better memory. 5. Avoid stressful situations. Live a simple life and avoid  aggravations. Organize your time and prepare for the next day in anticipation. 6. Sleep well, avoid any interruptions of sleep and avoid any distractions in the bedroom that may interfere with adequate sleep quality 7. Avoid sugar, avoid sweets as there is a strong link between excessive sugar intake, diabetes, and cognitive impairment We discussed the Mediterranean diet, which has been shown to help patients reduce the risk of progressive memory disorders and reduces cardiovascular risk. This includes eating fish, eat fruits and green leafy vegetables, nuts like almonds and hazelnuts, walnuts, and also use olive oil. Avoid fast foods and fried foods as much as possible. Avoid sweets and sugar as sugar use has been linked to worsening of memory function.  There is always a concern of gradual progression of memory problems. If this is the case, then we may need to adjust level of care according to patient needs. Support, both to the patient and caregiver, should then be put into place.      FALL PRECAUTIONS: Be cautious when walking. Scan the area for obstacles that may increase the risk of trips and falls. When getting up in the mornings, sit up at the edge of the bed for a few minutes before getting out of bed. Consider elevating the bed at the head end to avoid drop of blood pressure when getting up. Walk always in a well-lit room (use night lights in the walls). Avoid area rugs or power cords from appliances in the middle of the walkways. Use a walker or a cane if necessary and consider physical therapy for balance exercise. Get your eyesight checked regularly.  FINANCIAL OVERSIGHT: Supervision, especially oversight when making financial decisions or  transactions is also recommended.  HOME SAFETY: Consider the safety of the kitchen when operating appliances like stoves, microwave oven, and blender. Consider having supervision and share cooking responsibilities until no longer able to participate in  those. Accidents with firearms and other hazards in the house should be identified and addressed as well.   ABILITY TO BE LEFT ALONE: If patient is unable to contact 911 operator, consider using LifeLine, or when the need is there, arrange for someone to stay with patients. Smoking is a fire hazard, consider supervision or cessation. Risk of wandering should be assessed by caregiver and if detected at any point, supervision and safe proof recommendations should be instituted.  MEDICATION SUPERVISION: Inability to self-administer medication needs to be constantly addressed. Implement a mechanism to ensure safe administration of the medications.        Mediterranean Diet A Mediterranean diet refers to food and lifestyle choices that are based on the traditions of countries located on the [employer]. This way of eating has been shown to help prevent certain conditions and improve outcomes for people who have chronic diseases, like kidney disease and heart disease. What are tips for following this plan? Lifestyle  Cook and eat meals together with your family, when possible. Drink enough fluid to keep your urine clear or pale yellow. Be physically active every day. This includes: Aerobic exercise like running or swimming. Leisure activities like gardening, walking, or housework. Get 7-8 hours of sleep each night. If recommended by your health care provider, drink red wine in moderation. This means 1 glass a day for nonpregnant women and 2 glasses a day for men. A glass of wine equals 5 oz (150 mL). Reading food labels  Check the serving size of packaged foods. For foods such as rice and pasta, the serving size refers to the amount of cooked product, not dry. Check the total fat in packaged foods. Avoid foods that have saturated fat or trans fats. Check the ingredients list for added sugars, such as corn syrup. Shopping  At the grocery store, buy most of your food from the areas near the  walls of the store. This includes: Fresh fruits and vegetables (produce). Grains, beans, nuts, and seeds. Some of these may be available in unpackaged forms or large amounts (in bulk). Fresh seafood. Poultry and eggs. Low-fat dairy products. Buy whole ingredients instead of prepackaged foods. Buy fresh fruits and vegetables in-season from local farmers markets. Buy frozen fruits and vegetables in resealable bags. If you do not have access to quality fresh seafood, buy precooked frozen shrimp or canned fish, such as tuna, salmon, or sardines. Buy small amounts of raw or cooked vegetables, salads, or olives from the deli or salad bar at your store. Stock your pantry so you always have certain foods on hand, such as olive oil, canned tuna, canned tomatoes, rice, pasta, and beans. Cooking  Cook foods with extra-virgin olive oil instead of using butter or other vegetable oils. Have meat as a side dish, and have vegetables or grains as your main dish. This means having meat in small portions or adding small amounts of meat to foods like pasta or stew. Use beans or vegetables instead of meat in common dishes like chili or lasagna. Experiment with different cooking methods. Try roasting or broiling vegetables instead of steaming or sauteing them. Add frozen vegetables to soups, stews, pasta, or rice. Add nuts or seeds for added healthy fat at each meal. You can add these to yogurt, salads,  or vegetable dishes. Marinate fish or vegetables using olive oil, lemon juice, garlic, and fresh herbs. Meal planning  Plan to eat 1 vegetarian meal one day each week. Try to work up to 2 vegetarian meals, if possible. Eat seafood 2 or more times a week. Have healthy snacks readily available, such as: Vegetable sticks with hummus. Greek yogurt. Fruit and nut trail mix. Eat balanced meals throughout the week. This includes: Fruit: 2-3 servings a day Vegetables: 4-5 servings a day Low-fat dairy: 2 servings a  day Fish, poultry, or lean meat: 1 serving a day Beans and legumes: 2 or more servings a week Nuts and seeds: 1-2 servings a day Whole grains: 6-8 servings a day Extra-virgin olive oil: 3-4 servings a day Limit red meat and sweets to only a few servings a month What are my food choices? Mediterranean diet Recommended Grains: Whole-grain pasta. Brown rice. Bulgar wheat. Polenta. Couscous. Whole-wheat bread. [place]. Vegetables: Artichokes. Beets. Broccoli. Cabbage. Carrots. Eggplant. Green beans. Chard. Kale. Spinach. Onions. Leeks. Peas. Squash. Tomatoes. Peppers. Radishes. Fruits: Apples. Apricots. Avocado. Berries. Bananas. Cherries. Dates. Figs. Grapes. Lemons. Melon. Oranges. Peaches. Plums. Pomegranate. Meats and other protein foods: Beans. Almonds. Sunflower seeds. Pine nuts. Peanuts. [place]. Salmon. Scallops. Shrimp. [place]. Tilapia. Clams. Oysters. Eggs. Dairy: Low-fat milk. Cheese. Greek yogurt. Beverages: Water. Red wine. Herbal tea. Fats and oils: Extra virgin olive oil. Avocado oil. Grape seed oil. Sweets and desserts: [place] yogurt with honey. Baked apples. Poached pears. Trail mix. Seasoning and other foods: Basil. Cilantro. Coriander. Cumin. Mint. Parsley. Sage. Rosemary. Tarragon. Garlic. Oregano. Thyme. Pepper. Balsalmic vinegar. Tahini. Hummus. Tomato sauce. Olives. Mushrooms. Limit these Grains: Prepackaged pasta or rice dishes. Prepackaged cereal with added sugar. Vegetables: Deep fried potatoes (french fries). Fruits: Fruit canned in syrup. Meats and other protein foods: Beef. Pork. Lamb. Poultry with skin. Hot dogs. [place] Salines. Dairy: Ice cream. Sour cream. Whole milk. Beverages: Juice. Sugar-sweetened soft drinks. Beer. Liquor and spirits. Fats and oils: Butter. Canola oil. Vegetable oil. Beef fat (tallow). Lard. Sweets and desserts: Cookies. Cakes. Pies. Candy. Seasoning and other foods: Mayonnaise. Premade sauces and marinades. The items listed may not be a complete  list. Talk with your dietitian about what dietary choices are right for you. Summary The Mediterranean diet includes both food and lifestyle choices. Eat a variety of fresh fruits and vegetables, beans, nuts, seeds, and whole grains. Limit the amount of red meat and sweets that you eat. Talk with your health care provider about whether it is safe for you to drink red wine in moderation. This means 1 glass a day for nonpregnant women and 2 glasses a day for men. A glass of wine equals 5 oz (150 mL). This information is not intended to replace advice given to you by your health care provider. Make sure you discuss any questions you have with your health care provider. Document Released: 07/14/2016 Document Revised: 08/16/2016 Document Reviewed: 07/14/2016 Elsevier Interactive Patient Education  2017 [place] [address] [facility] [place]

## 2023-02-14 NOTE — Progress Notes (Signed)
B12 on the lower normal, needs to take B12 1000 mcg daily and follow with primary doctor, thanks

## 2023-02-14 NOTE — Progress Notes (Addendum)
Assessment/Plan:   Jonathan [name] is a very pleasant 62 y.o. year old RH male with a history of hypertension, hyperlipidemia, DM2, anxiety, depression, possible ADD-ADHD (per patient's report) seen today for evaluation of memory difficulties. MoCA today is 28/30. Workup is in progress.    Memory difficulties   MRI brain without contrast to assess for underlying structural abnormality and assess vascular load  Neuropsych testing for clarity of diagnosis and to determine other causes of memory difficulty such as depression, anxiety, attention, sleep issues, etc.   Check B12  Continue to control mood as per PCP and psychotherapy Recommend good control of cardiovascular risk factors.    Folllow up in 1 month to go over the MRI results   Subjective:   The patient is here alone   How long did patient have memory difficulties? For the past year . "Family pointed them out and I trust them". Wife had an appt this morning at her doctor at the same time and he did not recall the conversation from last night. "Little details"-he says. He always had difficulty with names. He can read and concentrate at work but at home I don't. "I used to be an avid reader but now I do not pleasure read" "In grade school had shown behavioral problems, a lot of distraction, developing compensatory strategies" repeats oneself?  Endorsed, for the last year Disoriented when walking into a room?  Patient denies   Leaving objects in unusual places? denies   Wandering behavior?  denies   Any personality changes since last visit?  Increased irritability for more than 1 year, he feels is due to ADHD (not formally diagnosed)." At work I am hyper-focused but at home I don't work as hard to be focused or working on clarity".   Any history of depression?:  Endorsed, especially since his wife had a serious medical problem and his mother recently died on Christmas day along with and other family and work stressors.  "Psychotherapy helps a lot.  Hallucinations or paranoia?  Patient denies   Seizures?   Patient denies    Any sleep changes? He sleeps well. Endorsed  vivid dreams, REM behavior or sleepwalking   Sleep apnea?  Patient denies   Any hygiene concerns?  Patient denies   Independent of bathing and dressing?  Endorsed  Does the patient needs help with medications? Patient  is in charge   Who is in charge of the finances?  Patient ands wife are  charge     Any changes in appetite? Decreased over the last 3 months, he thinks is due to meds    Patient have trouble swallowing? denies   Does the patient cook?  Endorsed Any kitchen accidents such as leaving the stove on? denies   Any headaches?   denies   Chronic back pain ? He has back pain dafter over picking up weight  Ambulates with difficulty?  He admits to not exercising enough, as well as having bilateral knee arthritis which limit his mobility. Recent falls or head injuries? denies   Vision changes? denies   Unilateral weakness, numbness or tingling? denies   Any tremors?   denies   Any anosmia?  Denies  Any incontinence of urine? denies   Any bowel dysfunction?  denies      Patient lives  with his wife History of heavy alcohol intake? denies   History of heavy tobacco use?  denies   Family history of dementia? Paternal aunt with rapidly progressive  dementia  (RPD) Does patient drive?  No issues , denies getting lost   He is a Company secretary, stressful job.  Pertinent labs TSH 1.56, A1c 6.4  Past Medical History:  Diagnosis Date   Allergic rhinitis    Allergy    Anxiety    Depression    Diabetes mellitus without complication ([place])    type 2   Heart murmur    per pt told it was functional   History of kidney stones    History of nephrolithiasis    Hyperlipidemia    Hypertension    Sebaceous cyst      Past Surgical History:  Procedure Laterality Date   COLONOSCOPY  07/2010   [facility] In[facility]   CYSTOSCOPY/URETEROSCOPY/HOLMIUM LASER/STENT  PLACEMENT Left 01/15/2020   Procedure: CYSTOSCOPY/URETEROSCOPY/HOLMIUM LASER/STENT PLACEMENT;  Surgeon: Ceasar Mons, MD;  Location: [facility];  Service: Urology;  Laterality: Left;   kidney stones removed  cystoscopy   x 2   TONSILLECTOMY     x 2   TYMPANOSTOMY TUBE PLACEMENT  as child     Allergies  Allergen Reactions   Statins Other (See Comments)    myalgias     Zocor [Simvastatin]      myalgias   Penicillins Hives    Per pt - happened in childhood - hives possible reaction    Current Outpatient Medications  Medication Instructions   ALPRAZolam (XANAX) 0.5 MG tablet Take 1/2-1 tab as needed every 4-6 hours as needed for severe anxiety and panic   B Complex Vitamins (VITAMIN B COMPLEX PO) Oral   buPROPion (WELLBUTRIN XL) 150 mg, Oral, Daily   fluticasone (FLONASE) 50 MCG/ACT nasal spray 1 spray, Each Nare, Daily, Begin by using 2 sprays in each nare daily for 3 to 5 days, then decrease to 1 spray in each nare daily.   losartan-hydrochlorothiazide (HYZAAR) 50-12.5 MG tablet TAKE 2 TABLETS DAILY   metFORMIN (GLUCOPHAGE-XR) 500 MG 24 hr tablet TAKE 1 TABLET BY MOUTH TWICE A DAY   Omega-3 Fatty Acids (FISH OIL PO) Oral   Probiotic Product (PROBIOTIC PO) 1 tablet, Oral, Every other day   rosuvastatin (CRESTOR) 5 MG tablet Take 5 mg  by mouth 5 days per week for 2 weeks  and then increase to  5 mg 7 days per  week as tolerated   Vilazodone HCl 20 MG TABS Take 1 tablet (20 mg total) by mouth daily with breakfast AND 0.5 tablets (10 mg total) daily with lunch.     VITALS:   Vitals:   02/14/23 0746  BP: (!) 188/96  Pulse: 82  Resp: 18  SpO2: 95%  Weight: 205 lb (93 kg)  Height: 5' 6.5" (1.689 m)    PHYSICAL EXAM   HEENT:  Normocephalic, atraumatic. The mucous membranes are moist. The superficial temporal arteries are without ropiness or tenderness. Cardiovascular: Regular rate and rhythm. Lungs: Clear to auscultation bilaterally. Neck: There  are no carotid bruits noted bilaterally.  NEUROLOGICAL:    02/14/2023    8:00 AM  Montreal Cognitive Assessment   Visuospatial/ Executive (0/5) 4  Naming (0/3) 3  Attention: Read list of digits (0/2) 1  Attention: Read list of letters (0/1) 1  Attention: Serial 7 subtraction starting at 100 (0/3) 3  Language: Repeat phrase (0/2) 2  Language : Fluency (0/1) 1  Abstraction (0/2) 2  Delayed Recall (0/5) 5  Orientation (0/6) 6  Total 28  Adjusted Score (based on education) 28  No data to display           Orientation:  Alert and oriented to person, place and time. No aphasia or dysarthria. Fund of knowledge is appropriate. Recent memory and remote memory intact.  Attention and concentration are slightly reduced  Able to name objects and repeat phrases. Delayed recall  5/5  Cranial nerves: There is good facial symmetry. Extraocular muscles are intact and visual fields are full to confrontational testing. Speech is fluent and clear. No tongue deviation. Hearing is intact to conversational tone. Tone: Tone is good throughout. Sensation: Sensation is intact to light touch and pinprick throughout. Vibration is intact at the bilateral big toe.There is no extinction with double simultaneous stimulation. There is no sensory dermatomal level identified. Coordination: The patient has no difficulty with RAM's or FNF bilaterally. Normal finger to nose  Motor: Strength is 5/5 in the bilateral upper and lower extremities. There is no pronator drift. There are no fasciculations noted. DTR's: Deep tendon reflexes are 2/4 at the bilateral biceps, triceps, brachioradialis, patella and achilles.  Plantar responses are downgoing bilaterally. Gait and Station: The patient is able to ambulate without difficulty.The patient is able to heel toe walk without any difficulty.The patient is able to ambulate in a tandem fashion. The patient is able to stand in the [place] position.     Thank you for  allowing [place] the opportunity to participate in the care of this nice patient. Please do not hesitate to contact us for any questions or concerns.   Total time spent on today's visit was 45 minutes dedicated to this patient today, preparing to see patient, examining the patient, ordering tests and/or medications and counseling the patient, documenting clinical information in the EHR or other health record, independently interpreting results and communicating results to the patient/family, discussing treatment and goals, answering patient's questions and coordinating care.  Cc:  Panosh, Standley Brooking, MD  Sharene Butters 02/14/2023 8:53 AM

## 2023-02-15 DIAGNOSIS — M9903 Segmental and somatic dysfunction of lumbar region: Secondary | ICD-10-CM | POA: Diagnosis not present

## 2023-02-15 DIAGNOSIS — M5414 Radiculopathy, thoracic region: Secondary | ICD-10-CM | POA: Diagnosis not present

## 2023-02-15 DIAGNOSIS — M9904 Segmental and somatic dysfunction of sacral region: Secondary | ICD-10-CM | POA: Diagnosis not present

## 2023-02-15 DIAGNOSIS — M9902 Segmental and somatic dysfunction of thoracic region: Secondary | ICD-10-CM | POA: Diagnosis not present

## 2023-02-15 NOTE — Telephone Encounter (Signed)
-----   Message from Rondel Jumbo, PA-C sent at 02/14/2023 12:39 PM EDT ----- B12 on the lower normal, needs to take B12 1000 mcg daily and follow with primary doctor, thanks

## 2023-02-16 ENCOUNTER — Ambulatory Visit
Admission: EM | Admit: 2023-02-16 | Discharge: 2023-02-16 | Disposition: A | Discharge status: Home / Self Care | Payer: BC Managed Care – PPO | Attending: Nurse Practitioner | Admitting: Nurse Practitioner

## 2023-02-16 ENCOUNTER — Ambulatory Visit: Payer: BC Managed Care – PPO | Admitting: Psychiatry

## 2023-02-16 DIAGNOSIS — M5441 Lumbago with sciatica, right side: Secondary | ICD-10-CM

## 2023-02-16 MED ORDER — CYCLOBENZAPRINE HCL 10 MG PO TABS: 10.0000 mg | ORAL_TABLET | Freq: Two times a day (BID) | ORAL | 0 refills | Status: DC | PRN

## 2023-02-16 MED ORDER — METHYLPREDNISOLONE ACETATE 80 MG/ML IJ SUSP
60.0000 mg | Freq: Once | INTRAMUSCULAR | Status: AC
  Administered 2023-02-16: 60 mg via INTRAMUSCULAR

## 2023-02-16 MED ORDER — KETOROLAC TROMETHAMINE 30 MG/ML IJ SOLN
30.0000 mg | Freq: Once | INTRAMUSCULAR | Status: AC
  Administered 2023-02-16: 30 mg via INTRAMUSCULAR

## 2023-02-16 MED ORDER — PREDNISONE 10 MG (21) PO TBPK: ORAL_TABLET | Freq: Every day | ORAL | 0 refills | Status: DC

## 2023-02-16 NOTE — Discharge Instructions (Addendum)
You were given a Toradol injection in clinic today. Do not take any over the counter NSAID's such as Advil, ibuprofen, Aleve, or naproxen for 24 hours.  You may take tylenol if needed Flexeril as needed.  Please note this medication can make you drowsy.  Do not drink alcohol or drive while on this medication Start prednisone taper tomorrow Continue heat to the back as needed Rest Follow-up with your PCP if symptoms do not improve Please go to the emergency room for any worsening symptoms

## 2023-02-16 NOTE — ED Provider Notes (Signed)
[facility]    CSN: [id number] Arrival date & time: 02/16/23  0801      History   Chief Complaint Chief Complaint  Patient presents with   Back Pain    HPI Jonathan [name] is a 62 y.o. male presents for evaluation of back pain.  Patient reports couple weeks ago he was helping a friend move and was carrying some mattresses up stairs when he lost his balance and had to catch himself before falling causing him to pull his right lower back.  Denies fall or injury to the back.  Since then he is reporting intermittent worsening right lower back pain that is not dull aching pain that does radiate down the outer aspect of his right thigh to his right calf.  He endorses some intermittent numbness on the outside of his leg but denies any weakness, saddle paresthesia or bowel or bladder incontinence.  Denies history of low back pain injuries or surgeries.  He did use ibuprofen and Tylenol as well as heat with minimal improvement.  He also went to his chiropractor twice this week without any improvement in symptoms.  He is a type II diabetic on metformin only.  No other concerns at this time   Back Pain   Past Medical History:  Diagnosis Date   Allergic rhinitis    Allergy    Anxiety    Depression    Diabetes mellitus without complication ([place])    type 2   Heart murmur    per pt told it was functional   History of kidney stones    History of nephrolithiasis    Hyperlipidemia    Hypertension    Sebaceous cyst     Patient Active Problem List   Diagnosis Date Noted   Memory difficulties 02/14/2023   Prediabetes 05/15/2016   Essential hypertension 05/15/2016   Hyperlipidemia 05/15/2016   Dyspepsia and other specified disorders of function of stomach 10/25/2013   Change in bowel habits 09/07/2013   Medication management 09/07/2013   Heartburn symptom 09/07/2013   Hyperglycemia 09/04/2013   BMI 31.0-31.9,adult 05/07/2013   SKIN LESION, UNCERTAIN SIGNIFICANCE 03/31/2010    NEPHROLITHIASIS, HX OF 02/10/2008   HYPERLIPIDEMIA 02/06/2008   ANXIETY 02/06/2008   DEPRESSION 02/06/2008   ATTENTION DEFICIT DISORDER, ADULT 02/06/2008   HYPERTENSION 02/06/2008   ALLERGIC RHINITIS 02/06/2008    Past Surgical History:  Procedure Laterality Date   COLONOSCOPY  07/2010   Deatra Ina   CYSTOSCOPY/URETEROSCOPY/HOLMIUM LASER/STENT PLACEMENT Left 01/15/2020   Procedure: CYSTOSCOPY/URETEROSCOPY/HOLMIUM LASER/STENT PLACEMENT;  Surgeon: Ceasar Mons, MD;  Location: [facility];  Service: Urology;  Laterality: Left;   kidney stones removed  cystoscopy   x 2   TONSILLECTOMY     x 2   TYMPANOSTOMY TUBE PLACEMENT  as child       Home Medications    Prior to Admission medications   Medication Sig Start Date End Date Taking? Authorizing Provider  cyclobenzaprine (FLEXERIL) 10 MG tablet Take 1 tablet (10 mg total) by mouth 2 (two) times daily as needed for muscle spasms. 02/16/23  Yes Melynda Ripple, NP  predniSONE (STERAPRED UNI-PAK 21 TAB) 10 MG (21) TBPK tablet Take by mouth daily. Take 6 tabs by mouth daily  for 1 day, then 5 tabs for 1 day, then 4 tabs for 1 day, then 3 tabs for 1 day, 2 tabs for 1 day, then 1 tab by mouth daily for 1 days 02/17/23  Yes Melynda Ripple, NP  ALPRAZolam (XANAX) 0.5 MG tablet Take 1/2-1 tab as needed every 4-6 hours as needed for severe anxiety and panic Patient not taking: Reported on 07/11/2022 03/29/22   Thayer Headings, PMHNP  B Complex Vitamins (VITAMIN B COMPLEX PO) Take by mouth.    [provider]  buPROPion (WELLBUTRIN XL) 150 MG 24 hr tablet TAKE 1 TABLET BY MOUTH EVERY DAY 02/08/23   Thayer Headings, PMHNP  fluticasone (FLONASE) 50 MCG/ACT nasal spray Place 1 spray into both nostrils daily. Begin by using 2 sprays in each nare daily for 3 to 5 days, then decrease to 1 spray in each nare daily. 03/17/22   Lynden Oxford Scales, PA-C  losartan-hydrochlorothiazide (HYZAAR) 50-12.5 MG tablet TAKE 2 TABLETS DAILY  03/30/22   Panosh, Standley Brooking, MD  metFORMIN (GLUCOPHAGE-XR) 500 MG 24 hr tablet TAKE 1 TABLET BY MOUTH TWICE A DAY 02/10/23   Dutch Quint B, FNP  Omega-3 Fatty Acids (FISH OIL PO) Take by mouth.    [provider]  Probiotic Product (PROBIOTIC PO) Take 1 tablet by mouth every other day.    [provider]  rosuvastatin (CRESTOR) 5 MG tablet Take 5 mg  by mouth 5 days per week for 2 weeks  and then increase to  5 mg 7 days per  week as tolerated Patient taking differently: daily. 09/29/22   Panosh, Standley Brooking, MD  Vilazodone HCl 20 MG TABS Take 1 tablet (20 mg total) by mouth daily with breakfast AND 0.5 tablets (10 mg total) daily with lunch. 09/15/22   Thayer Headings, PMHNP    Family History Family History  Problem Relation Age of Onset   Diabetes Father    Hypertension Father    Kidney cancer Father        now on dialysis   Kidney Stones Father    Kidney Stones Brother    Huntington's disease Maternal Grandfather    Depression Daughter    Anxiety disorder Daughter    ADD / ADHD Daughter    Colon polyps Daughter 22   Colon cancer Neg Hx    Esophageal cancer Neg Hx    Stomach cancer Neg Hx    Rectal cancer Neg Hx     Social History Social History   Tobacco Use   Smoking status: Never   Smokeless tobacco: Never  Vaping Use   Vaping Use: Never used  Substance Use Topics   Alcohol use: Yes    Comment: less than 1 a week   Drug use: No     Allergies   Statins, Zocor [simvastatin], and Penicillins   Review of Systems Review of Systems  Musculoskeletal:  Positive for back pain.     Physical Exam Triage Vital Signs ED Triage Vitals  Enc Vitals Group     BP 02/16/23 0825 (!) 157/95     Pulse Rate 02/16/23 0825 80     Resp 02/16/23 0825 20     Temp 02/16/23 0825 97.8 F (36.6 C)     Temp Source 02/16/23 0825 Oral     SpO2 02/16/23 0825 (!) 80 %     Weight --      Height --      Head Circumference --      Peak Flow --      Pain Score 02/16/23  0821 8     Pain Loc --      Pain Edu? --      Excl. in [place]? --    No data found.  Updated Vital Signs BP (!) 157/95 (BP Location: Left Arm)   Pulse 80   Temp 97.8 F (36.6 C) (Oral)   Resp 20   SpO2 93%   Visual Acuity Right Eye Distance:   Left Eye Distance:   Bilateral Distance:    Right Eye Near:   Left Eye Near:    Bilateral Near:     Physical Exam Vitals and nursing note reviewed.  Constitutional:      Appearance: Normal appearance.  HENT:     Head: Normocephalic and atraumatic.  Eyes:     Pupils: Pupils are equal, round, and reactive to light.  Cardiovascular:     Rate and Rhythm: Normal rate.  Pulmonary:     Effort: Pulmonary effort is normal.  Musculoskeletal:     Lumbar back: Spasms and tenderness present. No swelling, deformity, lacerations or bony tenderness. Positive right straight leg raise test. Negative left straight leg raise test. No scoliosis.       Back:     Comments: Strength 5 out of 5 bilateral lower extremities.  Skin:    General: Skin is warm and dry.  Neurological:     General: No focal deficit present.     Mental Status: He is alert and oriented to person, place, and time.     Deep Tendon Reflexes:     Reflex Scores:      Patellar reflexes are 2+ on the right side and 2+ on the left side. Psychiatric:        Mood and Affect: Mood normal.        Behavior: Behavior normal.      UC Treatments / Results  Labs (all labs ordered are listed, but only abnormal results are displayed) Labs Reviewed - No data to display  EKG   Radiology No results found.  Procedures Procedures (including critical care time)  Medications Ordered in UC Medications  ketorolac (TORADOL) 30 MG/ML injection 30 mg (30 mg Intramuscular Given 02/16/23 0859)  methylPREDNISolone acetate (DEPO-MEDROL) injection 60 mg (60 mg Intramuscular Given 02/16/23 0859)    Initial Impression / Assessment and Plan / UC Course  I have reviewed the triage vital signs and  the nursing notes.  Pertinent labs & imaging results that were available during my care of the patient were reviewed by me and considered in my medical decision making (see chart for details).     Reviewed exam and symptoms with patient.  No red flags Discussed low back strain with sciatica.  Toradol and Depo-Medrol injection in clinic.  Patient monitored for 10 minutes after injection with no reaction noted and tolerated well.  He was instructed no NSAIDs for 24 hours and he verbalized understanding Flexeril as needed.  Side effect profile reviewed Patient will use OTC ibuprofen starting tomorrow as needed Prednisone taper to start tomorrow Continue heat to the back Rest Follow-up with PCP if symptoms do not improve ER precautions reviewed and patient verbalized understanding Final Clinical Impressions(s) / UC Diagnoses   Final diagnoses:  Acute right-sided low back pain with right-sided sciatica     Discharge Instructions      You were given a Toradol injection in clinic today. Do not take any over the counter NSAID's such as Advil, ibuprofen, Aleve, or naproxen for 24 hours.  You may take tylenol if needed Flexeril as needed.  Please note this medication can make you drowsy.  Do not drink alcohol or drive while on this medication Start prednisone taper tomorrow Continue heat to  the back as needed Rest Follow-up with your PCP if symptoms do not improve Please go to the emergency room for any worsening symptoms     ED Prescriptions     Medication Sig Dispense Auth. Provider   cyclobenzaprine (FLEXERIL) 10 MG tablet Take 1 tablet (10 mg total) by mouth 2 (two) times daily as needed for muscle spasms. 10 tablet Melynda Ripple, NP   predniSONE (STERAPRED UNI-PAK 21 TAB) 10 MG (21) TBPK tablet Take by mouth daily. Take 6 tabs by mouth daily  for 1 day, then 5 tabs for 1 day, then 4 tabs for 1 day, then 3 tabs for 1 day, 2 tabs for 1 day, then 1 tab by mouth daily for 1 days 21  tablet Melynda Ripple, NP      PDMP not reviewed this encounter.   Melynda Ripple, NP 02/16/23 [phone]

## 2023-02-16 NOTE — ED Triage Notes (Addendum)
Pt c/o Low back pain starting 3/7 feels like dull ache at belt line that radiates to front side of right calf. Calf feels heavy at all times with tingling, sharp pain in front of right calf worsens with twisting movements.  3/2 pt was moving heavy objects, tripped and twisted back while climbing stairs with heavy object and attributes pain to this event.    Home remedies: ice, heat, Ibuprofen and Tylenol. Pt has not had any Motrin or Tylenol today.

## 2023-02-20 ENCOUNTER — Ambulatory Visit (HOSPITAL_BASED_OUTPATIENT_CLINIC_OR_DEPARTMENT_OTHER)
Admission: RE | Admit: 2023-02-20 | Discharge: 2023-02-20 | Disposition: A | Discharge status: Home / Self Care | Payer: BC Managed Care – PPO | Source: Ambulatory Visit | Attending: Nurse Practitioner | Admitting: Nurse Practitioner

## 2023-02-20 ENCOUNTER — Telehealth: Payer: Self-pay

## 2023-02-20 ENCOUNTER — Ambulatory Visit
Admission: EM | Admit: 2023-02-20 | Discharge: 2023-02-20 | Disposition: A | Discharge status: Home / Self Care | Payer: BC Managed Care – PPO | Attending: Nurse Practitioner | Admitting: Nurse Practitioner

## 2023-02-20 DIAGNOSIS — M5441 Lumbago with sciatica, right side: Secondary | ICD-10-CM

## 2023-02-20 DIAGNOSIS — M47816 Spondylosis without myelopathy or radiculopathy, lumbar region: Secondary | ICD-10-CM | POA: Diagnosis not present

## 2023-02-20 MED ORDER — CYCLOBENZAPRINE HCL 10 MG PO TABS: 10.0000 mg | ORAL_TABLET | Freq: Two times a day (BID) | ORAL | 0 refills | Status: DC | PRN

## 2023-02-20 MED ORDER — NAPROXEN 500 MG PO TABS: 500.0000 mg | ORAL_TABLET | Freq: Two times a day (BID) | ORAL | 0 refills | Status: AC

## 2023-02-20 NOTE — Discharge Instructions (Signed)
Continue Flexeril as needed Naproxen twice daily for 5 days.  Take this medication with food.  Do not take any over-the-counter NSAIDs such as ibuprofen, Advil, Aleve as these are similar types of medications I will contact you with results of the spine x-ray done today once available Heat to the back Please follow-up with your PCP for further evaluation and treatment options Please go to the emergency room if you have any worsening symptoms

## 2023-02-20 NOTE — Telephone Encounter (Signed)
Patient verification complete (name and date of birth).   Patient called to make aware of xray results. Per provider xray results were negative for fracture. Pt is to continue the treatment discussed and follow-up with his PCP. Patient acknowledges understanding, and all questions answered.

## 2023-02-20 NOTE — ED Triage Notes (Addendum)
Pt c/o right lower back pain that moves down right leg. Patient states the pain is felt more in the calf and states the leg feels heavy. The patient also c/o dry mouth.   Home interventions: tylenol, motrin, recently finished prednisone.

## 2023-02-20 NOTE — ED Provider Notes (Signed)
[facility]    CSN: [id number] Arrival date & time: 02/20/23  1409      History   Chief Complaint Chief Complaint  Patient presents with   Leg Pain    HPI Jonathan [name] is a 62 y.o. male presents for evaluation of back pain.  Patient was seen in urgent care on 3/14 for right low back pain with sciatica after moving a mattress.  He was given Toradol injection in clinic, Depo-Medrol injection and started on a Medrol Dosepak and Flexeril.  He states his pain greatly improved down to a 2-3 out of 10 until last night when it seemed to worsen again.  He states it is a 6-7 out of 10 currently.  Denies any new injury or inciting event.  He has been taking the Flexeril and he feels that has been helping.  He is inquiring about an x-ray before he follows up with his primary.  He has been alternating Tylenol and ibuprofen OTC with minimal relief.  He endorses numbness on the outside of his calf which she had prior and this has not worsened.  He still denies any tingling/weakness of his lower extremity but does state his right leg feels heavy at times.  No bowel or bladder incontinence, no saddle paresthesia.  No other concerns at this time.   Leg Pain Associated symptoms: back pain     Past Medical History:  Diagnosis Date   Allergic rhinitis    Allergy    Anxiety    Depression    Diabetes mellitus without complication ([place])    type 2   Heart murmur    per pt told it was functional   History of kidney stones    History of nephrolithiasis    Hyperlipidemia    Hypertension    Sebaceous cyst     Patient Active Problem List   Diagnosis Date Noted   Memory difficulties 02/14/2023   Prediabetes 05/15/2016   Essential hypertension 05/15/2016   Hyperlipidemia 05/15/2016   Dyspepsia and other specified disorders of function of stomach 10/25/2013   Change in bowel habits 09/07/2013   Medication management 09/07/2013   Heartburn symptom 09/07/2013   Hyperglycemia 09/04/2013    BMI 31.0-31.9,adult 05/07/2013   SKIN LESION, UNCERTAIN SIGNIFICANCE 03/31/2010   NEPHROLITHIASIS, HX OF 02/10/2008   HYPERLIPIDEMIA 02/06/2008   ANXIETY 02/06/2008   DEPRESSION 02/06/2008   ATTENTION DEFICIT DISORDER, ADULT 02/06/2008   HYPERTENSION 02/06/2008   ALLERGIC RHINITIS 02/06/2008    Past Surgical History:  Procedure Laterality Date   COLONOSCOPY  07/2010   Deatra Ina   CYSTOSCOPY/URETEROSCOPY/HOLMIUM LASER/STENT PLACEMENT Left 01/15/2020   Procedure: CYSTOSCOPY/URETEROSCOPY/HOLMIUM LASER/STENT PLACEMENT;  Surgeon: Ceasar Mons, MD;  Location: [facility];  Service: Urology;  Laterality: Left;   kidney stones removed  cystoscopy   x 2   TONSILLECTOMY     x 2   TYMPANOSTOMY TUBE PLACEMENT  as child       Home Medications    Prior to Admission medications   Medication Sig Start Date End Date Taking? Authorizing Provider  cyclobenzaprine (FLEXERIL) 10 MG tablet Take 1 tablet (10 mg total) by mouth 2 (two) times daily as needed for muscle spasms. 02/20/23  Yes Melynda Ripple, NP  naproxen (NAPROSYN) 500 MG tablet Take 1 tablet (500 mg total) by mouth 2 (two) times daily for 5 days. 02/20/23 02/25/23 Yes Melynda Ripple, NP  ALPRAZolam Duanne Moron) 0.5 MG tablet Take 1/2-1 tab as needed every 4-6 hours as  needed for severe anxiety and panic Patient not taking: Reported on 07/11/2022 03/29/22   Thayer Headings, PMHNP  B Complex Vitamins (VITAMIN B COMPLEX PO) Take by mouth.    [provider]  buPROPion (WELLBUTRIN XL) 150 MG 24 hr tablet TAKE 1 TABLET BY MOUTH EVERY DAY 02/08/23   Thayer Headings, PMHNP  fluticasone (FLONASE) 50 MCG/ACT nasal spray Place 1 spray into both nostrils daily. Begin by using 2 sprays in each nare daily for 3 to 5 days, then decrease to 1 spray in each nare daily. 03/17/22   Lynden Oxford Scales, PA-C  losartan-hydrochlorothiazide (HYZAAR) 50-12.5 MG tablet TAKE 2 TABLETS DAILY 03/30/22   Panosh, Standley Brooking, MD  metFORMIN  (GLUCOPHAGE-XR) 500 MG 24 hr tablet TAKE 1 TABLET BY MOUTH TWICE A DAY 02/10/23   Dutch Quint B, FNP  Omega-3 Fatty Acids (FISH OIL PO) Take by mouth.    [provider]  predniSONE (STERAPRED UNI-PAK 21 TAB) 10 MG (21) TBPK tablet Take by mouth daily. Take 6 tabs by mouth daily  for 1 day, then 5 tabs for 1 day, then 4 tabs for 1 day, then 3 tabs for 1 day, 2 tabs for 1 day, then 1 tab by mouth daily for 1 days 02/17/23   Melynda Ripple, NP  Probiotic Product (PROBIOTIC PO) Take 1 tablet by mouth every other day.    [provider]  rosuvastatin (CRESTOR) 5 MG tablet Take 5 mg  by mouth 5 days per week for 2 weeks  and then increase to  5 mg 7 days per  week as tolerated Patient taking differently: daily. 09/29/22   Panosh, Standley Brooking, MD  Vilazodone HCl 20 MG TABS Take 1 tablet (20 mg total) by mouth daily with breakfast AND 0.5 tablets (10 mg total) daily with lunch. 09/15/22   Thayer Headings, PMHNP    Family History Family History  Problem Relation Age of Onset   Diabetes Father    Hypertension Father    Kidney cancer Father        now on dialysis   Kidney Stones Father    Kidney Stones Brother    Huntington's disease Maternal Grandfather    Depression Daughter    Anxiety disorder Daughter    ADD / ADHD Daughter    Colon polyps Daughter 94   Colon cancer Neg Hx    Esophageal cancer Neg Hx    Stomach cancer Neg Hx    Rectal cancer Neg Hx     Social History Social History   Tobacco Use   Smoking status: Never   Smokeless tobacco: Never  Vaping Use   Vaping Use: Never used  Substance Use Topics   Alcohol use: Yes    Comment: less than 1 a week   Drug use: No     Allergies   Statins, Zocor [simvastatin], and Penicillins   Review of Systems Review of Systems  Musculoskeletal:  Positive for back pain.     Physical Exam Triage Vital Signs ED Triage Vitals [02/20/23 1423]  Enc Vitals Group     BP (!) 171/97     Pulse Rate 84     Resp 18     Temp  98 F (36.7 C)     Temp Source Oral     SpO2 94 %     Weight      Height      Head Circumference      Peak Flow      Pain  Score 6     Pain Loc      Pain Edu?      Excl. in [place]?    No data found.  Updated Vital Signs BP (!) 171/97 (BP Location: Right Arm)   Pulse 84   Temp 98 F (36.7 C) (Oral)   Resp 18   SpO2 94%   Visual Acuity Right Eye Distance:   Left Eye Distance:   Bilateral Distance:    Right Eye Near:   Left Eye Near:    Bilateral Near:     Physical Exam Vitals and nursing note reviewed.  Constitutional:      Appearance: Normal appearance.  HENT:     Head: Normocephalic and atraumatic.  Eyes:     Pupils: Pupils are equal, round, and reactive to light.  Cardiovascular:     Rate and Rhythm: Normal rate.  Pulmonary:     Effort: Pulmonary effort is normal.  Musculoskeletal:     Thoracic back: Normal.     Lumbar back: Tenderness present. No swelling or bony tenderness. Positive right straight leg raise test. Negative left straight leg raise test.       Back:     Comments: Strength remains 5 out of 5 bilateral lower extremities.  Skin:    General: Skin is warm and dry.  Neurological:     General: No focal deficit present.     Mental Status: He is alert and oriented to person, place, and time.     Deep Tendon Reflexes:     Reflex Scores:      Patellar reflexes are 2+ on the right side and 2+ on the left side. Psychiatric:        Mood and Affect: Mood normal.        Behavior: Behavior normal.      UC Treatments / Results  Labs (all labs ordered are listed, but only abnormal results are displayed) Labs Reviewed - No data to display  EKG   Radiology No results found.  Procedures Procedures (including critical care time)  Medications Ordered in UC Medications - No data to display  Initial Impression / Assessment and Plan / UC Course  I have reviewed the triage vital signs and the nursing notes.  Pertinent labs & imaging results that  were available during my care of the patient were reviewed by me and considered in my medical decision making (see chart for details).     Lumbar x-ray.  No x-ray onsite today.  Patient will have done at med center and I will contact him with those results Continue Flexeril and refill sent to pharmacy Trial naproxen twice daily for 5 days Continue heat to the back Advised patient needs follow-up with PCP for any additional treatment options or workup of his symptoms and he verbalized understanding Strict ER precautions reviewed and patient verbalized understanding Final Clinical Impressions(s) / UC Diagnoses   Final diagnoses:  Acute right-sided low back pain with right-sided sciatica     Discharge Instructions      Continue Flexeril as needed Naproxen twice daily for 5 days.  Take this medication with food.  Do not take any over-the-counter NSAIDs such as ibuprofen, Advil, Aleve as these are similar types of medications I will contact you with results of the spine x-ray done today once available Heat to the back Please follow-up with your PCP for further evaluation and treatment options Please go to the emergency room if you have any worsening symptoms   ED Prescriptions  Medication Sig Dispense Auth. Provider   cyclobenzaprine (FLEXERIL) 10 MG tablet Take 1 tablet (10 mg total) by mouth 2 (two) times daily as needed for muscle spasms. 10 tablet Melynda Ripple, NP   naproxen (NAPROSYN) 500 MG tablet Take 1 tablet (500 mg total) by mouth 2 (two) times daily for 5 days. 10 tablet Melynda Ripple, NP      PDMP not reviewed this encounter.   Melynda Ripple, NP 02/20/23 1510

## 2023-02-22 NOTE — Telephone Encounter (Signed)
Received unable to take patient never muliptles to contact patient, tried to call her patient to call [facility].

## 2023-02-23 ENCOUNTER — Other Ambulatory Visit: Payer: Self-pay | Admitting: Internal Medicine

## 2023-02-23 ENCOUNTER — Telehealth: Payer: Self-pay | Admitting: Physician Assistant

## 2023-02-23 ENCOUNTER — Ambulatory Visit: Payer: BC Managed Care – PPO | Admitting: Psychology

## 2023-02-23 DIAGNOSIS — F330 Major depressive disorder, recurrent, mild: Secondary | ICD-10-CM | POA: Diagnosis not present

## 2023-02-23 DIAGNOSIS — F902 Attention-deficit hyperactivity disorder, combined type: Secondary | ICD-10-CM

## 2023-02-23 DIAGNOSIS — F4321 Adjustment disorder with depressed mood: Secondary | ICD-10-CM

## 2023-02-23 NOTE — Telephone Encounter (Signed)
Left message with the after hour service on 02-23-23 at 12:09 pm    Caller states that they received a fax referral from our office and sent a return on the 18th letting office know they weren't able to reach patient   Caller would like to confirm the office received the fax

## 2023-02-24 ENCOUNTER — Encounter: Payer: Self-pay | Admitting: Psychiatry

## 2023-02-24 ENCOUNTER — Ambulatory Visit: Payer: BC Managed Care – PPO | Admitting: Psychiatry

## 2023-02-24 DIAGNOSIS — F410 Panic disorder [episodic paroxysmal anxiety] without agoraphobia: Secondary | ICD-10-CM

## 2023-02-24 DIAGNOSIS — F902 Attention-deficit hyperactivity disorder, combined type: Secondary | ICD-10-CM

## 2023-02-24 DIAGNOSIS — F330 Major depressive disorder, recurrent, mild: Secondary | ICD-10-CM

## 2023-02-24 MED ORDER — METHYLPHENIDATE HCL ER (OSM) 27 MG PO TBCR: 27.0000 mg | EXTENDED_RELEASE_TABLET | ORAL | 0 refills | Status: DC

## 2023-02-24 NOTE — Progress Notes (Signed)
[facility] Counselor/Therapist Progress Note  Patient ID: FREEMONT KLUS, MRN: [id number],    Date:02/23/2023  Time Spent: 60 minutes  Treatment Type: Individual Therapy  Reported Symptoms: sadness, inability to pay attention  Mental Status Exam: Appearance:  Casual     Behavior: Appropriate  Motor: Normal  Speech/Language:  Normal Rate  Affect: Appropriate  Mood: pleasant  Thought process: normal  Thought content:   WNL  Sensory/Perceptual disturbances:   WNL  Orientation: oriented to person, place, time/date, and situation  Attention: Good  Concentration: Good  Memory: WNL  Fund of knowledge:  Good  Insight:   Good  Judgment:  Good  Impulse Control: Good   Risk Assessment: Danger to Self:  No Self-injurious Behavior: No Danger to Others: No Duty to Warn:no Physical Aggression / Violence:No  Access to Firearms a concern: No  Gang Involvement:No   Subjective: The patient attended a face-to-face individual therapy session in the office today.  The patient presents with a blunted affect and mood is pleasant.  The patient reports that he feels a little overwhelmed with all the things that are going on right now.  He has Ivor Costa coming up and he also has been dealing with the loss of his mother and her estate.  In addition his wife's father is not well and he is having some problems with his health.  We talked about him slowing things down and trying to do some of his mindfulness and meditation activities so that he can just take 1 thing at a time.  Provided cognitive behavioral therapy and problem solving   Interventions: Cognitive Behavioral Therapy and Insight-Oriented  Diagnosis:Mild episode of recurrent major depressive disorder (HCC)  Attention deficit hyperactivity disorder (ADHD), combined type  Grief  Plan: Client Abilities/Strengths  Intelligent, insightful, motivated  Client Treatment Preferences  Outpatient individual therapy   Client Statement of Needs  "I decided I needed therapy to help me sort through a situation I am dealing with"  Treatment Level  Outpatient Individual therapy  Symptoms  Increased defensiveness with his wife: (Status: improved). Passive -  aggressive behaviors: (Status: improved).  Problems Addressed  adjustment disorder, Vocational Stress, Vocational Stress, Vocational Stress, Vocational Stress,  Vocational Stress  Goals 1. Decrease incidents of exhibiting passive aggressive behavior Objective Learn and implement problem-solving skills. Target Date: 2023-09-16 Frequency: Monthly Progress: 90 Modality: individual Related Interventions 1. Conduct Problem-Solving Therapy (see Problem-Solving Therapy by Shawnee Knapp and Delane Ginger) using techniques such as psychoeducation, modeling, and role-playing to teach the client problem solving skills (i.e., defining a problem specifically, generating possible solutions, evaluating the  pros and cons of each solution, selecting and implementing a plan of action, evaluating the  efficacy of the plan, accepting or revising the plan); role-play application of the problemsolving skill to a real life issue (or assign "Applying Problem-Solving to Interpersonal Conflict" in the Adult Psychotherapy Homework Planner by Bryn Gulling). 2. Improve satisfaction and comfort surrounding coworker relationships. 3. Increase job satisfaction and performance due to implementation of  assertiveness and stress management strategies. 4. Increase sense of confidence and competence in dealing with work  responsibilities. Objective Verbalize healthy, realistic cognitive messages that promote harmony with others, self-acceptance, and  self-confidence. Target Date: 2023-09-16 Frequency: Monthly Progress: 90 Modality: individual 5. Increase sense of self-esteem and elevation of mood in spite of  unemployment. 6. Pursue employment consistency with a reasonably hopeful and  positive attitude. Diagnosis Adult ADD 296.31 (Major depressive affective disorder, recurrent episode, mild) -  309.28 (Adjustment disorder with mixed anxiety and depressed mood) - Conditions For Discharge Achievement of treatment goals and objectives  The patient approved this plan and is making progress.  Judithann Villamar G Adonai Selsor, LCSW

## 2023-02-24 NOTE — Progress Notes (Unsigned)
ORAS SPLITT [id number] [birth year] 62 y.o.  Subjective:   Patient ID:  Jonathan [name] is a 62 y.o. (DOB 07/10/1961) male.  Chief Complaint: No chief complaint on file.   HPI Jonathan [name] presents to the office today for follow-up of ***  He reports recent severe sciatica that he attributes this to lifting some objects.   He reports that his mood is "better, much." He feels that Wellbutrin has been helpful for his mood. "I feel a certain clarity... I still crave focus." He reports that he has difficulty sustaining concentration. He reports, "I feel the best mood-wise, than I have felt in a long time." He reports that he saw neurology after wife and daughter have repeatedly expressed concerns about his memory. He saw neurology and reports that memory screening tools were normal and that he is going to have an MRI. He reports that his energy and motivation have been good. Sleeping well. Appetite has been consistent and "stayed relatively low." Denies SI.  He reports that he has had significant stress in the last year with deaths or mother and mother-in-law, and near death of wife. He reports that work has been stressful with multiple things occurring   Minimal caffeine use.   Has not needed Xanax prn recently.   Past Psychiatric Medication Trials: Citalopram- Sexual side effects, fatigue. Partially effective for depression. Increased to 15 mg in the last month and noticed more fatigue. He reports that he has been taking 10 mg most of the time since then.  Trintellix- Improved mood, anxiety, and focus on 5 mg. Excessive somnolence on 10 mg. Pristiq Wellbutrin- Took 25 years ago. Cannot recall response. Concerta- Effective. Raised BP.  Modafinil-Helpful for concentration. Caused some edginess  GAD-7    Flowsheet Row Office Visit from 01/24/2023 in [place] at [place]  Total GAD-7 Score 9      [place] Office Visit from 01/24/2023 in  [place] [facility] [place] Video Visit from 07/11/2022 in [place] [facility] [place] from 05/05/2021 in [place] [facility] [place] from 10/19/2020 in [place] at [place] from 08/02/2019 in [place] at [place]  PHQ-2 Total Score 1 3 2  0 4  PHQ-9 Total Score 5 12 12 9 15       Flowsheet Row ED from 02/20/2023 in [facility] ED from 02/16/2023 in [facility] [place] [facility] ED from 03/21/2022 in [facility] [place]  C-SSRS RISK CATEGORY No Risk No Risk No Risk        Review of Systems:  Review of Systems  Musculoskeletal:  Negative for gait problem.  Neurological:  Negative for headaches.       Occ mild hand tremor  Psychiatric/Behavioral:         Please refer to HPI    He reports that BP readings at home have been below 130/80. He reports some white coat hypertension and that BP was high during neurology visit since he was anxious and high at urgent care visit due to pain.   Medications: I have reviewed the patient's current medications.  Current Outpatient Medications  Medication Sig Dispense Refill   ALPRAZolam (XANAX) 0.5 MG tablet Take 1/2-1 tab as needed every 4-6 hours as needed for severe anxiety and panic (Patient not taking: Reported on 07/11/2022) 30 tablet 0  B Complex Vitamins (VITAMIN B COMPLEX PO) Take by mouth.     buPROPion (WELLBUTRIN XL) 150 MG 24 hr tablet TAKE 1 TABLET BY MOUTH EVERY DAY 30 tablet 1   cyclobenzaprine (FLEXERIL) 10 MG tablet Take 1 tablet (10 mg total) by mouth 2 (two) times daily as needed for muscle spasms. 10 tablet 0   fluticasone (FLONASE) 50 MCG/ACT nasal spray Place 1 spray into both nostrils daily. Begin by using 2 sprays in each nare daily for 3 to 5 days, then decrease to 1 spray in each nare daily. 32  mL 1   losartan-hydrochlorothiazide (HYZAAR) 50-12.5 MG tablet TAKE 2 TABLETS DAILY 180 tablet 3   metFORMIN (GLUCOPHAGE-XR) 500 MG 24 hr tablet TAKE 1 TABLET BY MOUTH TWICE A DAY 180 tablet 0   naproxen (NAPROSYN) 500 MG tablet Take 1 tablet (500 mg total) by mouth 2 (two) times daily for 5 days. 10 tablet 0   Omega-3 Fatty Acids (FISH OIL PO) Take by mouth.     predniSONE (STERAPRED UNI-PAK 21 TAB) 10 MG (21) TBPK tablet Take by mouth daily. Take 6 tabs by mouth daily  for 1 day, then 5 tabs for 1 day, then 4 tabs for 1 day, then 3 tabs for 1 day, 2 tabs for 1 day, then 1 tab by mouth daily for 1 days 21 tablet 0   Probiotic Product (PROBIOTIC PO) Take 1 tablet by mouth every other day.     rosuvastatin (CRESTOR) 5 MG tablet Take 5 mg  by mouth 5 days per week for 2 weeks  and then increase to  5 mg 7 days per  week as tolerated (Patient taking differently: daily.) 90 tablet 0   Vilazodone HCl 20 MG TABS Take 1 tablet (20 mg total) by mouth daily with breakfast AND 0.5 tablets (10 mg total) daily with lunch. 135 tablet 1   No current facility-administered medications for this visit.    Medication Side Effects: None  Allergies:  Allergies  Allergen Reactions   Statins Other (See Comments)    myalgias     Zocor [Simvastatin]      myalgias   Penicillins Hives    Per pt - happened in childhood - hives possible reaction    Past Medical History:  Diagnosis Date   Allergic rhinitis    Allergy    Anxiety    Depression    Diabetes mellitus without complication (HCC)    type 2   Heart murmur    per pt told it was functional   History of kidney stones    History of nephrolithiasis    Hyperlipidemia    Hypertension    Sebaceous cyst     Past Medical History, Surgical history, Social history, and Family history were reviewed and updated as appropriate.   Please see review of systems for further details on the patient's review from today.   Objective:   Physical Exam:  There  were no vitals taken for this visit.  Physical Exam  Lab Review:     Component Value Date/Time   NA 140 01/23/2023 0851   NA 141 08/17/2021 0000   K 4.2 01/23/2023 0851   CL 103 01/23/2023 0851   CO2 28 01/23/2023 0851   GLUCOSE 117 (H) 01/23/2023 0851   BUN 13 01/23/2023 0851   CREATININE 1.31 01/23/2023 0851   CREATININE 1.15 10/19/2020 1206   CALCIUM 9.7 01/23/2023 0851   PROT 6.6 01/23/2023 0851   ALBUMIN 4.5 01/23/2023 [id number]  AST 21 01/23/2023 0851   ALT 22 01/23/2023 0851   ALKPHOS 56 01/23/2023 0851   BILITOT 0.8 01/23/2023 0851   GFRNONAA 72.1 08/17/2021 0000   GFRNONAA 68 10/19/2020 1206   GFRAA 83.5 08/17/2021 0000   GFRAA 79 10/19/2020 1206       Component Value Date/Time   WBC 10.3 01/23/2023 0851   RBC 5.36 01/23/2023 0851   HGB 16.7 01/23/2023 0851   HCT 48.6 01/23/2023 0851   PLT 246.0 01/23/2023 0851   MCV 90.7 01/23/2023 0851   MCV 94.2 04/29/2013 0936   MCH 30.9 10/19/2020 1206   MCHC 34.4 01/23/2023 0851   RDW 13.0 01/23/2023 0851   LYMPHSABS 2.7 01/23/2023 0851   MONOABS 0.8 01/23/2023 0851   EOSABS 0.6 01/23/2023 0851   BASOSABS 0.1 01/23/2023 0851    No results found for: "POCLITH", "LITHIUM"   No results found for: "PHENYTOIN", "PHENOBARB", "VALPROATE", "CBMZ"   .res Assessment: Plan:    There are no diagnoses linked to this encounter.   Please see After Visit Summary for patient specific instructions.  Future Appointments  Date Time Provider [place]  02/24/2023  9:00 AM Thayer Headings, PMHNP CP-[facility] None  03/03/2023  5:50 PM GI-315 MR 2 GI-315MRI GI-[facility] [address] [facility]  03/07/2023  3:00 PM Panosh, Standley Brooking, MD [facility] P[facility]  03/23/2023  8:00 AM Cottle, Lucious Groves, LCSW [facility] None  03/29/2023  1:00 PM Rondel Jumbo, PA-C [facility] None    No orders of the defined types were placed in this encounter.   -------------------------------

## 2023-02-27 MED ORDER — METFORMIN HCL ER 500 MG PO TB24: ORAL_TABLET | ORAL | 0 refills | Status: DC

## 2023-03-02 ENCOUNTER — Other Ambulatory Visit: Payer: Self-pay | Admitting: Internal Medicine

## 2023-03-03 ENCOUNTER — Other Ambulatory Visit: Payer: BC Managed Care – PPO

## 2023-03-06 NOTE — Progress Notes (Unsigned)
No chief complaint on file.   HPI: Jonathan [name] 62 y.o. come in for   fu ED visit for acute r lbp with r sciatica  3 14 and 3 18  onset after lifting  rx toradol DepoMedrol flexeril  improved  felt to have   scaitica  ms neur cause .  Never had neuro sx ROS: See pertinent positives and negatives per HPI.  Past Medical History:  Diagnosis Date   Allergic rhinitis    Allergy    Anxiety    Depression    Diabetes mellitus without complication (HCC)    type 2   Heart murmur    per pt told it was functional   History of kidney stones    History of nephrolithiasis    Hyperlipidemia    Hypertension    Sebaceous cyst     Family History  Problem Relation Age of Onset   Diabetes Father    Hypertension Father    Kidney cancer Father        now on dialysis   Kidney Stones Father    Kidney Stones Brother    Huntington's disease Maternal Grandfather    Depression Daughter    Anxiety disorder Daughter    ADD / ADHD Daughter    Colon polyps Daughter 53   Colon cancer Neg Hx    Esophageal cancer Neg Hx    Stomach cancer Neg Hx    Rectal cancer Neg Hx     Social History   Socioeconomic History   Marital status: Married    Spouse name: Not on file   Number of children: 1   Years of education: 18   Highest education level: Not on file  Occupational History   Occupation: MINISTER    Employer: [employer]  Tobacco Use   Smoking status: Never   Smokeless tobacco: Never  Vaping Use   Vaping Use: Never used  Substance and Sexual Activity   Alcohol use: Yes    Comment: less than 1 a week   Drug use: No   Sexual activity: Yes  Other Topics Concern   Not on file  Social History Narrative   Married   Regular exercise- no   HH of 3   Dog car and lizard   Sleep wakening at times   New job [employer]   Per week reg sleep.    Social Determinants of Health   Financial Resource Strain: Not on file  Food Insecurity: Not on file  Transportation Needs:  Not on file  Physical Activity: Not on file  Stress: Not on file  Social Connections: Not on file    Outpatient Medications Prior to Visit  Medication Sig Dispense Refill   ALPRAZolam (XANAX) 0.5 MG tablet Take 1/2-1 tab as needed every 4-6 hours as needed for severe anxiety and panic (Patient not taking: Reported on 07/11/2022) 30 tablet 0   B Complex Vitamins (VITAMIN B COMPLEX PO) Take by mouth.     buPROPion (WELLBUTRIN XL) 150 MG 24 hr tablet TAKE 1 TABLET BY MOUTH EVERY DAY 30 tablet 1   cyclobenzaprine (FLEXERIL) 10 MG tablet Take 1 tablet (10 mg total) by mouth 2 (two) times daily as needed for muscle spasms. 10 tablet 0   fluticasone (FLONASE) 50 MCG/ACT nasal spray Place 1 spray into both nostrils daily. Begin by using 2 sprays in each nare daily for 3 to 5 days, then decrease to 1 spray in each nare daily. 32 mL  1   losartan-hydrochlorothiazide (HYZAAR) 50-12.5 MG tablet TAKE 2 TABLETS DAILY 180 tablet 0   metFORMIN (GLUCOPHAGE-XR) 500 MG 24 hr tablet Take 3 tablets per day 270 tablet 0   methylphenidate (CONCERTA) 27 MG PO CR tablet Take 1 tablet (27 mg total) by mouth every morning. 30 tablet 0   Omega-3 Fatty Acids (FISH OIL PO) Take by mouth.     predniSONE (STERAPRED UNI-PAK 21 TAB) 10 MG (21) TBPK tablet Take by mouth daily. Take 6 tabs by mouth daily  for 1 day, then 5 tabs for 1 day, then 4 tabs for 1 day, then 3 tabs for 1 day, 2 tabs for 1 day, then 1 tab by mouth daily for 1 days (Patient not taking: Reported on 02/24/2023) 21 tablet 0   Probiotic Product (PROBIOTIC PO) Take 1 tablet by mouth every other day.     rosuvastatin (CRESTOR) 5 MG tablet Take 5 mg  by mouth 5 days per week for 2 weeks  and then increase to  5 mg 7 days per  week as tolerated (Patient taking differently: daily.) 90 tablet 0   Vilazodone HCl 20 MG TABS Take 1 tablet (20 mg total) by mouth daily with breakfast AND 0.5 tablets (10 mg total) daily with lunch. 135 tablet 1   No facility-administered  medications prior to visit.     EXAM:  There were no vitals taken for this visit.  There is no height or weight on file to calculate BMI.  GENERAL: vitals reviewed and listed above, alert, oriented, appears well hydrated and in no acute distress HEENT: atraumatic, conjunctiva  clear, no obvious abnormalities on inspection of external nose and ears OP : no lesion edema or exudate  NECK: no obvious masses on inspection palpation  LUNGS: clear to auscultation bilaterally, no wheezes, rales or rhonchi, good air movement CV: HRRR, no clubbing cyanosis or  peripheral edema nl cap refill  MS: moves all extremities without noticeable focal  abnormality PSYCH: pleasant and cooperative, no obvious depression or anxiety Lab Results  Component Value Date   WBC 10.3 01/23/2023   HGB 16.7 01/23/2023   HCT 48.6 01/23/2023   PLT 246.0 01/23/2023   GLUCOSE 117 (H) 01/23/2023   CHOL 132 01/23/2023   TRIG 87.0 01/23/2023   HDL 48.70 01/23/2023   LDLDIRECT 209.0 03/30/2022   LDLCALC 66 01/23/2023   ALT 22 01/23/2023   AST 21 01/23/2023   NA 140 01/23/2023   K 4.2 01/23/2023   CL 103 01/23/2023   CREATININE 1.31 01/23/2023   BUN 13 01/23/2023   CO2 28 01/23/2023   TSH 1.56 01/23/2023   PSA 0.84 01/23/2023   HGBA1C 6.4 01/23/2023   MICROALBUR 1.9 01/23/2023   BP Readings from Last 3 Encounters:  02/20/23 (!) 171/97  02/16/23 (!) 157/95  02/14/23 (!) 188/96    ASSESSMENT AND PLAN:  Discussed the following assessment and plan:  No diagnosis found.  -Patient advised to return or notify health care team  if  new concerns arise.  There are no Patient Instructions on file for this visit.   Standley Brooking. Schylar Allard M.D.

## 2023-03-07 ENCOUNTER — Encounter: Payer: Self-pay | Admitting: Internal Medicine

## 2023-03-07 ENCOUNTER — Ambulatory Visit: Payer: BC Managed Care – PPO | Admitting: Internal Medicine

## 2023-03-07 VITALS — BP 134/82 | HR 93 | Temp 97.6°F | Ht 66.5 in | Wt 202.4 lb

## 2023-03-07 DIAGNOSIS — Z87442 Personal history of urinary calculi: Secondary | ICD-10-CM | POA: Diagnosis not present

## 2023-03-07 DIAGNOSIS — T887XXA Unspecified adverse effect of drug or medicament, initial encounter: Secondary | ICD-10-CM

## 2023-03-07 DIAGNOSIS — Z79899 Other long term (current) drug therapy: Secondary | ICD-10-CM | POA: Diagnosis not present

## 2023-03-07 DIAGNOSIS — M545 Low back pain, unspecified: Secondary | ICD-10-CM | POA: Diagnosis not present

## 2023-03-07 NOTE — Patient Instructions (Addendum)
Yes this sounds like back sciatica  .   Consider  seeing   PT   Ask prescriber about other form or lower dose of concerta ? Vyvanse etc  Exam is good

## 2023-03-23 ENCOUNTER — Ambulatory Visit (INDEPENDENT_AMBULATORY_CARE_PROVIDER_SITE_OTHER): Payer: BC Managed Care – PPO | Admitting: Psychology

## 2023-03-23 ENCOUNTER — Ambulatory Visit
Admission: RE | Admit: 2023-03-23 | Discharge: 2023-03-23 | Disposition: A | Discharge status: Home / Self Care | Payer: BC Managed Care – PPO | Source: Ambulatory Visit | Attending: Physician Assistant | Admitting: Physician Assistant

## 2023-03-23 DIAGNOSIS — R413 Other amnesia: Secondary | ICD-10-CM | POA: Diagnosis not present

## 2023-03-23 DIAGNOSIS — I6782 Cerebral ischemia: Secondary | ICD-10-CM | POA: Diagnosis not present

## 2023-03-23 DIAGNOSIS — F902 Attention-deficit hyperactivity disorder, combined type: Secondary | ICD-10-CM | POA: Diagnosis not present

## 2023-03-23 DIAGNOSIS — F4321 Adjustment disorder with depressed mood: Secondary | ICD-10-CM

## 2023-03-23 DIAGNOSIS — F330 Major depressive disorder, recurrent, mild: Secondary | ICD-10-CM

## 2023-03-23 NOTE — Progress Notes (Addendum)
[facility] Counselor/Therapist Progress Note  Patient ID: Jonathan [name], MRN: [id number],    Date:03/23/2023  Time Spent: 60 minutes  Treatment Type: Individual Therapy  Reported Symptoms: sadness, inability to pay attention  Mental Status Exam: Appearance:  Casual     Behavior: Appropriate  Motor: Normal  Speech/Language:  Normal Rate  Affect: Appropriate  Mood: pleasant  Thought process: normal  Thought content:   WNL  Sensory/Perceptual disturbances:   WNL  Orientation: oriented to person, place, time/date, and situation  Attention: Good  Concentration: Good  Memory: WNL  Fund of knowledge:  Good  Insight:   Good  Judgment:  Good  Impulse Control: Good   Risk Assessment: Danger to Self:  No Self-injurious Behavior: No Danger to Others: No Duty to Warn:no Physical Aggression / Violence:No  Access to Firearms a concern: No  Gang Involvement:No   Subjective: The patient attended a face-to-face individual therapy session in the office today.  The patient presents with a blunted affect and mood is pleasant.  The patient talked about continuing to kind of come down from having things happened so rapidly on top of each other over the last several months.  The patient changed his visit to virtual today because he felt ill yesterday and we talked about this being related probably to his running and not taking care of himself.  The patient states that he feels like he is calming down now and can focus more on meditation and mindfulness and we talked about some ways that he can do that in his current routine.  The patient is doing very well and he feels like he can continue to spread his appointments out and possibly go out further moving forward but we are going to schedule monthly at this point in time.  Interventions: Cognitive Behavioral Therapy and Insight-Oriented  Diagnosis:Attention deficit hyperactivity disorder (ADHD), combined type  Mild episode of  recurrent major depressive disorder  Grief  Plan: Client Abilities/Strengths  Intelligent, insightful, motivated  Client Treatment Preferences  Outpatient individual therapy  Client Statement of Needs  "I decided I needed therapy to help me sort through a situation I am dealing with"  Treatment Level  Outpatient Individual therapy  Symptoms  Increased defensiveness with his wife: (Status: improved). Passive -  aggressive behaviors: (Status: improved).  Problems Addressed  adjustment disorder, Vocational Stress, Vocational Stress, Vocational Stress, Vocational Stress,  Vocational Stress  Goals 1. Decrease incidents of exhibiting passive aggressive behavior Objective Learn and implement problem-solving skills. Target Date: 2023-09-16 Frequency: Monthly Progress: 90 Modality: individual Related Interventions 1. Conduct Problem-Solving Therapy (see Problem-Solving Therapy by Domenick Bookbinder and Rob Hickman) using techniques such as psychoeducation, modeling, and role-playing to teach the client problem solving skills (i.e., defining a problem specifically, generating possible solutions, evaluating the  pros and cons of each solution, selecting and implementing a plan of action, evaluating the  efficacy of the plan, accepting or revising the plan); role-play application of the problemsolving skill to a real life issue (or assign "Applying Problem-Solving to Interpersonal Conflict" in the Adult Psychotherapy Homework Planner by Stephannie Li). 2. Improve satisfaction and comfort surrounding coworker relationships. 3. Increase job satisfaction and performance due to implementation of  assertiveness and stress management strategies. 4. Increase sense of confidence and competence in dealing with work  responsibilities. Objective Verbalize healthy, realistic cognitive messages that promote harmony with others, self-acceptance, and  self-confidence. Target Date: 2023-09-16 Frequency: Monthly Progress: 90  Modality: individual 5. Increase sense of self-esteem and elevation of mood in  spite of  unemployment. 6. Pursue employment consistency with a reasonably hopeful and positive attitude. Diagnosis Adult ADD 296.31 (Major depressive affective disorder, recurrent episode, mild) - 309.28 (Adjustment disorder with mixed anxiety and depressed mood) - Conditions For Discharge Achievement of treatment goals and objectives  The patient approved this plan and is making progress.  Charlyne Robertshaw G Dekota Kirlin, LCSW

## 2023-03-24 ENCOUNTER — Encounter: Payer: Self-pay | Admitting: Psychiatry

## 2023-03-24 ENCOUNTER — Telehealth (INDEPENDENT_AMBULATORY_CARE_PROVIDER_SITE_OTHER): Payer: BC Managed Care – PPO | Admitting: Psychiatry

## 2023-03-24 DIAGNOSIS — F3342 Major depressive disorder, recurrent, in full remission: Secondary | ICD-10-CM

## 2023-03-24 DIAGNOSIS — F410 Panic disorder [episodic paroxysmal anxiety] without agoraphobia: Secondary | ICD-10-CM

## 2023-03-24 DIAGNOSIS — F902 Attention-deficit hyperactivity disorder, combined type: Secondary | ICD-10-CM

## 2023-03-24 MED ORDER — BUPROPION HCL ER (XL) 150 MG PO TB24: 150.0000 mg | ORAL_TABLET | Freq: Every day | ORAL | 1 refills | Status: DC

## 2023-03-24 MED ORDER — VILAZODONE HCL 20 MG PO TABS: ORAL_TABLET | ORAL | 1 refills | Status: DC

## 2023-03-24 NOTE — Progress Notes (Signed)
Jonathan [name] [id number] 1961/05/22 62 y.o.  Virtual Visit via Video Note  I connected with pt @ on 03/24/23 at 10:00 AM EDT by a video enabled telemedicine application and verified that I am speaking with the correct person using two identifiers.   I discussed the limitations of evaluation and management by telemedicine and the availability of in person appointments. The patient expressed understanding and agreed to proceed.  I discussed the assessment and treatment plan with the patient. The patient was provided an opportunity to ask questions and all were answered. The patient agreed with the plan and demonstrated an understanding of the instructions.   The patient was advised to call back or seek an in-person evaluation if the symptoms worsen or if the condition fails to improve as anticipated.  I provided 30 minutes of non-face-to-face time during this encounter.  The patient was located at work  The provider was located at home.   Jonathan [name], PMHNP   Subjective:   Patient ID:  Jonathan [name] is a 62 y.o. (DOB 06-21-1961) male.  Chief Complaint:  Chief Complaint  Patient presents with   Follow-up    Anxiety, depression, ADHD    HPI Jonathan [name] presents for follow-up of ADHD, anxiety, and depression. He reports, "I'm doing well." He reports that he took Concerta about a week and then stopped it. He reports that it made him almost "jittery." He reports that his HR was elevated (95 or higher). He reports that his BP has been stable,ie. 128/80.  He reports jitteriness resolved after stopping Concerta. Denies current anxiety. Denies depressed mood. He reports that Wellbutrin XL has seemed to help with focus and concentration. He reports that he read 4-5 chapters in a book for pleasure recently for the first time in years. He reports that concentration is not currently interfering with function. He reports that his energy has been lower this week due to allergies. He  reports that overall energy has improved. He has been sleeping well. Appetite has been stable. Denies SI.   He reports that family stressors "have really toned down."    He is looking forward to volunteering at [employer] next week.   Has not needed Alprazolam recently. He reports occasionally forgetting to take mid-day dose of Viibryd.   Past Psychiatric Medication Trials: Citalopram- Sexual side effects, fatigue. Partially effective for depression. Increased to 15 mg in the last month and noticed more fatigue. He reports that he has been taking 10 mg most of the time since then.  Trintellix- Improved mood, anxiety, and focus on 5 mg. Excessive somnolence on 10 mg. Pristiq Wellbutrin- Took 25 years ago. Cannot recall response. Concerta- Effective. Raised BP. Caused jitteriness.  Modafinil-Helpful for concentration. Caused some edginess  Review of Systems:  Review of Systems  Musculoskeletal:  Negative for gait problem.       Improved sciatica with yoga poses  Allergic/Immunologic: Positive for environmental allergies.  Psychiatric/Behavioral:         Please refer to HPI    Medications: I have reviewed the patient's current medications.  Current Outpatient Medications  Medication Sig Dispense Refill   B Complex Vitamins (VITAMIN B COMPLEX PO) Take by mouth.     fluticasone (FLONASE) 50 MCG/ACT nasal spray Place 1 spray into both nostrils daily. Begin by using 2 sprays in each nare daily for 3 to 5 days, then decrease to 1 spray in each nare daily. 32 mL 1   losartan-hydrochlorothiazide (HYZAAR) 50-12.5 MG tablet TAKE  2 TABLETS DAILY 180 tablet 0   metFORMIN (GLUCOPHAGE-XR) 500 MG 24 hr tablet Take 3 tablets per day 270 tablet 0   Omega-3 Fatty Acids (FISH OIL PO) Take by mouth.     Probiotic Product (PROBIOTIC PO) Take 1 tablet by mouth every other day.     rosuvastatin (CRESTOR) 5 MG tablet Take 5 mg  by mouth 5 days per week for 2 weeks  and then increase to  5 mg 7 days per   week as tolerated (Patient taking differently: Take 5 mg by mouth. Three times a week) 90 tablet 0   ALPRAZolam (XANAX) 0.5 MG tablet Take 1/2-1 tab as needed every 4-6 hours as needed for severe anxiety and panic (Patient not taking: Reported on 07/11/2022) 30 tablet 0   buPROPion (WELLBUTRIN XL) 150 MG 24 hr tablet Take 1 tablet (150 mg total) by mouth daily. 90 tablet 1   cyclobenzaprine (FLEXERIL) 10 MG tablet Take 1 tablet (10 mg total) by mouth 2 (two) times daily as needed for muscle spasms. 10 tablet 0   Vilazodone HCl 20 MG TABS Take 1 tablet (20 mg total) by mouth daily with breakfast AND 0.5 tablets (10 mg total) daily with lunch. 135 tablet 1   No current facility-administered medications for this visit.    Medication Side Effects: None  Allergies:  Allergies  Allergen Reactions   Statins Other (See Comments)    myalgias     Zocor [Simvastatin]      myalgias   Penicillins Hives    Per pt - happened in childhood - hives possible reaction    Past Medical History:  Diagnosis Date   Allergic rhinitis    Allergy    Anxiety    Depression    Diabetes mellitus without complication    type 2   Heart murmur    per pt told it was functional   History of kidney stones    History of nephrolithiasis    Hyperlipidemia    Hypertension    Sebaceous cyst     Family History  Problem Relation Age of Onset   Diabetes Father    Hypertension Father    Kidney cancer Father        now on dialysis   Kidney Stones Father    Kidney Stones Brother    Huntington's disease Maternal Grandfather    Depression Daughter    Anxiety disorder Daughter    ADD / ADHD Daughter    Colon polyps Daughter 11   Colon cancer Neg Hx    Esophageal cancer Neg Hx    Stomach cancer Neg Hx    Rectal cancer Neg Hx     Social History   Socioeconomic History   Marital status: Married    Spouse name: Not on file   Number of children: 1   Years of education: 18   Highest education level: Not on  file  Occupational History   Occupation: MINISTER    Employer: [employer]  Tobacco Use   Smoking status: Never   Smokeless tobacco: Never  Vaping Use   Vaping Use: Never used  Substance and Sexual Activity   Alcohol use: Yes    Comment: less than 1 a week   Drug use: No   Sexual activity: Yes  Other Topics Concern   Not on file  Social History Narrative   Married   Regular exercise- no   HH of 3   Dog car and lizard  Sleep wakening at times   New job [employer]   Per week reg sleep.    Social Determinants of Health   Financial Resource Strain: Not on file  Food Insecurity: Not on file  Transportation Needs: Not on file  Physical Activity: Not on file  Stress: Not on file  Social Connections: Not on file  Intimate Partner Violence: Not on file    Past Medical History, Surgical history, Social history, and Family history were reviewed and updated as appropriate.   Please see review of systems for further details on the patient's review from today.   Objective:   Physical Exam:  BP 128/80   Physical Exam Neurological:     Mental Status: He is alert and oriented to person, place, and time.     Cranial Nerves: No dysarthria.  Psychiatric:        Attention and Perception: Attention and perception normal.        Mood and Affect: Mood normal.        Speech: Speech normal.        Behavior: Behavior is cooperative.        Thought Content: Thought content normal. Thought content is not paranoid or delusional. Thought content does not include homicidal or suicidal ideation. Thought content does not include homicidal or suicidal plan.        Cognition and Memory: Cognition and memory normal.        Judgment: Judgment normal.     Comments: Insight intact     Lab Review:     Component Value Date/Time   NA 140 01/23/2023 0851   NA 141 08/17/2021 0000   K 4.2 01/23/2023 0851   CL 103 01/23/2023 0851   CO2 28 01/23/2023 0851   GLUCOSE 117 (H)  01/23/2023 0851   BUN 13 01/23/2023 0851   CREATININE 1.31 01/23/2023 0851   CREATININE 1.15 10/19/2020 1206   CALCIUM 9.7 01/23/2023 0851   PROT 6.6 01/23/2023 0851   ALBUMIN 4.5 01/23/2023 0851   AST 21 01/23/2023 0851   ALT 22 01/23/2023 0851   ALKPHOS 56 01/23/2023 0851   BILITOT 0.8 01/23/2023 0851   GFRNONAA 72.1 08/17/2021 0000   GFRNONAA 68 10/19/2020 1206   GFRAA 83.5 08/17/2021 0000   GFRAA 79 10/19/2020 1206       Component Value Date/Time   WBC 10.3 01/23/2023 0851   RBC 5.36 01/23/2023 0851   HGB 16.7 01/23/2023 0851   HCT 48.6 01/23/2023 0851   PLT 246.0 01/23/2023 0851   MCV 90.7 01/23/2023 0851   MCV 94.2 04/29/2013 0936   MCH 30.9 10/19/2020 1206   MCHC 34.4 01/23/2023 0851   RDW 13.0 01/23/2023 0851   LYMPHSABS 2.7 01/23/2023 0851   MONOABS 0.8 01/23/2023 0851   EOSABS 0.6 01/23/2023 0851   BASOSABS 0.1 01/23/2023 0851    No results found for: "POCLITH", "LITHIUM"   No results found for: "PHENYTOIN", "PHENOBARB", "VALPROATE", "CBMZ"   .res Assessment: Plan:    Pt seen for 30 minutes and time spent discussing response to Concerta and ongoing treatment plan. Agree with discontinuing Concerta due to multiple side effects. He reports that Wellbutrin XL seemed to have helped with concentration and he is noticing improved focus for tasks like reading for pleasure. He reports that overall his mood, anxiety, and concentration symptoms are well controlled at this time and he prefers not to start another medication for ADHD at this time. Agree with continuing current medications without changes.  Will remove  Concerta from medication list since pt stopped taking Concerta due to multiple side effects.  Will continue Wellbutrin XL 150 mg po qd for depression.  Continue Viibryd 20 mg daily in the morning and 10 mg daily with lunch for anxiety and depression.  Recommend continuing therapy with Bambi Cottle, LCSW.  Pt to follow-up in 2 months or sooner if clinically  indicated.  Patient advised to contact office with any questions, adverse effects, or acute worsening in signs and symptoms.   Jonathan [name] was seen today for follow-up.  Diagnoses and all orders for this visit:  Attention deficit hyperactivity disorder (ADHD), combined type -     buPROPion (WELLBUTRIN XL) 150 MG 24 hr tablet; Take 1 tablet (150 mg total) by mouth daily.  Recurrent major depressive disorder, in full remission -     Vilazodone HCl 20 MG TABS; Take 1 tablet (20 mg total) by mouth daily with breakfast AND 0.5 tablets (10 mg total) daily with lunch. -     buPROPion (WELLBUTRIN XL) 150 MG 24 hr tablet; Take 1 tablet (150 mg total) by mouth daily.  Panic -     Vilazodone HCl 20 MG TABS; Take 1 tablet (20 mg total) by mouth daily with breakfast AND 0.5 tablets (10 mg total) daily with lunch.     Please see After Visit Summary for patient specific instructions.  Future Appointments  Date Time Provider Department Center  03/29/2023  1:00 PM Marcos Eke, PA-C [facility] None  04/20/2023  9:00 AM Cottle, Bambi G, LCSW [id number] None    No orders of the defined types were placed in this encounter.     -------------------------------

## 2023-03-29 ENCOUNTER — Ambulatory Visit: Payer: BC Managed Care – PPO | Admitting: Physician Assistant

## 2023-03-29 ENCOUNTER — Encounter: Payer: Self-pay | Admitting: Physician Assistant

## 2023-03-29 VITALS — BP 140/80 | HR 79 | Resp 20 | Ht 66.5 in | Wt 200.0 lb

## 2023-03-29 DIAGNOSIS — R413 Other amnesia: Secondary | ICD-10-CM

## 2023-03-29 NOTE — Progress Notes (Signed)
Already discussed with patient in person. No need to call pt

## 2023-03-29 NOTE — Patient Instructions (Signed)
It was a pleasure to see you today at our office.   Recommendations: Follow up as needed   Whom to call:  Memory  decline, memory medications: Call our office [phone]   For psychiatric meds, mood meds: Please have your primary care physician manage these medications.   Counseling regarding caregiver distress, including caregiver depression, anxiety and issues regarding community resources, adult day care programs, adult living facilities, or memory care questions:   Feel free to contact Misty Lisabeth Register, Social Worker at [phone]   For assessment of decision of mental capacity and competency:  Call Dr. Erick Blinks, geriatric psychiatrist at [phone]    If you have any severe symptoms of a stroke, or other severe issues such as confusion,severe chills or fever, etc call 911 or go to the ER as you may need to be evaluated further    Feel free to go to the following database for funded clinical studies conducted around the world: RankChecks.se   https://www.triadclinicaltrials.com/     RECOMMENDATIONS FOR ALL PATIENTS WITH MEMORY PROBLEMS: 1. Continue to exercise (Recommend 30 minutes of walking everyday, or 3 hours every week) 2. Increase social interactions - continue going to [place] and enjoy social gatherings with friends and family 3. Eat healthy, avoid fried foods and eat more fruits and vegetables 4. Maintain adequate blood pressure, blood sugar, and blood cholesterol level. Reducing the risk of stroke and cardiovascular disease also helps promoting better memory. 5. Avoid stressful situations. Live a simple life and avoid aggravations. Organize your time and prepare for the next day in anticipation. 6. Sleep well, avoid any interruptions of sleep and avoid any distractions in the bedroom that may interfere with adequate sleep quality 7. Avoid sugar, avoid sweets as there is a strong link between excessive sugar intake, diabetes, and cognitive  impairment We discussed the Mediterranean diet, which has been shown to help patients reduce the risk of progressive memory disorders and reduces cardiovascular risk. This includes eating fish, eat fruits and green leafy vegetables, nuts like almonds and hazelnuts, walnuts, and also use olive oil. Avoid fast foods and fried foods as much as possible. Avoid sweets and sugar as sugar use has been linked to worsening of memory function.  There is always a concern of gradual progression of memory problems. If this is the case, then we may need to adjust level of care according to patient needs. Support, both to the patient and caregiver, should then be put into place.      FALL PRECAUTIONS: Be cautious when walking. Scan the area for obstacles that may increase the risk of trips and falls. When getting up in the mornings, sit up at the edge of the bed for a few minutes before getting out of bed. Consider elevating the bed at the head end to avoid drop of blood pressure when getting up. Walk always in a well-lit room (use night lights in the walls). Avoid area rugs or power cords from appliances in the middle of the walkways. Use a walker or a cane if necessary and consider physical therapy for balance exercise. Get your eyesight checked regularly.  FINANCIAL OVERSIGHT: Supervision, especially oversight when making financial decisions or transactions is also recommended.  HOME SAFETY: Consider the safety of the kitchen when operating appliances like stoves, microwave oven, and blender. Consider having supervision and share cooking responsibilities until no longer able to participate in those. Accidents with firearms and other hazards in the house should be identified and addressed as well.  ABILITY TO BE LEFT ALONE: If patient is unable to contact 911 operator, consider using LifeLine, or when the need is there, arrange for someone to stay with patients. Smoking is a fire hazard, consider supervision or  cessation. Risk of wandering should be assessed by caregiver and if detected at any point, supervision and safe proof recommendations should be instituted.  MEDICATION SUPERVISION: Inability to self-administer medication needs to be constantly addressed. Implement a mechanism to ensure safe administration of the medications.        Mediterranean Diet A Mediterranean diet refers to food and lifestyle choices that are based on the traditions of countries located on the [employer]. This way of eating has been shown to help prevent certain conditions and improve outcomes for people who have chronic diseases, like kidney disease and heart disease. What are tips for following this plan? Lifestyle  Cook and eat meals together with your family, when possible. Drink enough fluid to keep your urine clear or pale yellow. Be physically active every day. This includes: Aerobic exercise like running or swimming. Leisure activities like gardening, walking, or housework. Get 7-8 hours of sleep each night. If recommended by your health care provider, drink red wine in moderation. This means 1 glass a day for nonpregnant women and 2 glasses a day for men. A glass of wine equals 5 oz (150 mL). Reading food labels  Check the serving size of packaged foods. For foods such as rice and pasta, the serving size refers to the amount of cooked product, not dry. Check the total fat in packaged foods. Avoid foods that have saturated fat or trans fats. Check the ingredients list for added sugars, such as corn syrup. Shopping  At the grocery store, buy most of your food from the areas near the walls of the store. This includes: Fresh fruits and vegetables (produce). Grains, beans, nuts, and seeds. Some of these may be available in unpackaged forms or large amounts (in bulk). Fresh seafood. Poultry and eggs. Low-fat dairy products. Buy whole ingredients instead of prepackaged foods. Buy fresh fruits and  vegetables in-season from local farmers markets. Buy frozen fruits and vegetables in resealable bags. If you do not have access to quality fresh seafood, buy precooked frozen shrimp or canned fish, such as tuna, salmon, or sardines. Buy small amounts of raw or cooked vegetables, salads, or olives from the deli or salad bar at your store. Stock your pantry so you always have certain foods on hand, such as olive oil, canned tuna, canned tomatoes, rice, pasta, and beans. Cooking  Cook foods with extra-virgin olive oil instead of using butter or other vegetable oils. Have meat as a side dish, and have vegetables or grains as your main dish. This means having meat in small portions or adding small amounts of meat to foods like pasta or stew. Use beans or vegetables instead of meat in common dishes like chili or lasagna. Experiment with different cooking methods. Try roasting or broiling vegetables instead of steaming or sauteing them. Add frozen vegetables to soups, stews, pasta, or rice. Add nuts or seeds for added healthy fat at each meal. You can add these to yogurt, salads, or vegetable dishes. Marinate fish or vegetables using olive oil, lemon juice, garlic, and fresh herbs. Meal planning  Plan to eat 1 vegetarian meal one day each week. Try to work up to 2 vegetarian meals, if possible. Eat seafood 2 or more times a week. Have healthy snacks readily available, such as: Vegetable  sticks with hummus. Greek yogurt. Fruit and nut trail mix. Eat balanced meals throughout the week. This includes: Fruit: 2-3 servings a day Vegetables: 4-5 servings a day Low-fat dairy: 2 servings a day Fish, poultry, or lean meat: 1 serving a day Beans and legumes: 2 or more servings a week Nuts and seeds: 1-2 servings a day Whole grains: 6-8 servings a day Extra-virgin olive oil: 3-4 servings a day Limit red meat and sweets to only a few servings a month What are my food choices? Mediterranean  diet Recommended Grains: Whole-grain pasta. Brown rice. Bulgar wheat. Polenta. Couscous. Whole-wheat bread. Orpah Cobb. Vegetables: Artichokes. Beets. Broccoli. Cabbage. Carrots. Eggplant. Green beans. Chard. Kale. Spinach. Onions. Leeks. Peas. Squash. Tomatoes. Peppers. Radishes. Fruits: Apples. Apricots. Avocado. Berries. Bananas. Cherries. Dates. Figs. Grapes. Lemons. Melon. Oranges. Peaches. Plums. Pomegranate. Meats and other protein foods: Beans. Almonds. Sunflower seeds. Pine nuts. Peanuts. Cod. Salmon. Scallops. Shrimp. Tuna. Tilapia. Clams. Oysters. Eggs. Dairy: Low-fat milk. Cheese. Greek yogurt. Beverages: Water. Red wine. Herbal tea. Fats and oils: Extra virgin olive oil. Avocado oil. Grape seed oil. Sweets and desserts: [place] yogurt with honey. Baked apples. Poached pears. Trail mix. Seasoning and other foods: Basil. Cilantro. Coriander. Cumin. Mint. Parsley. Sage. Rosemary. Tarragon. Garlic. Oregano. Thyme. Pepper. Balsalmic vinegar. Tahini. Hummus. Tomato sauce. Olives. Mushrooms. Limit these Grains: Prepackaged pasta or rice dishes. Prepackaged cereal with added sugar. Vegetables: Deep fried potatoes (french fries). Fruits: Fruit canned in syrup. Meats and other protein foods: Beef. Pork. Lamb. Poultry with skin. Hot dogs. Tomasa Blase. Dairy: Ice cream. Sour cream. Whole milk. Beverages: Juice. Sugar-sweetened soft drinks. Beer. Liquor and spirits. Fats and oils: Butter. Canola oil. Vegetable oil. Beef fat (tallow). Lard. Sweets and desserts: Cookies. Cakes. Pies. Candy. Seasoning and other foods: Mayonnaise. Premade sauces and marinades. The items listed may not be a complete list. Talk with your dietitian about what dietary choices are right for you. Summary The Mediterranean diet includes both food and lifestyle choices. Eat a variety of fresh fruits and vegetables, beans, nuts, seeds, and whole grains. Limit the amount of red meat and sweets that you eat. Talk with your  health care provider about whether it is safe for you to drink red wine in moderation. This means 1 glass a day for nonpregnant women and 2 glasses a day for men. A glass of wine equals 5 oz (150 mL). This information is not intended to replace advice given to you by your health care provider. Make sure you discuss any questions you have with your health care provider. Document Released: 07/14/2016 Document Revised: 08/16/2016 Document Reviewed: 07/14/2016 Elsevier Interactive Patient Education  2017 [employer].  Labs today [address] [facility] at [facility] [phone]

## 2023-03-29 NOTE — Progress Notes (Signed)
Assessment/Plan:   Memory difficulty  Jonathan [name] is a very pleasant 62 y.o. RH male  with a history of hypertension, hyperlipidemia, DM2, anxiety, depression, possible ADD-ADHD (per patient's report) seen today in follow up to discuss the MRI of the brain results. These were personally reviewed, and is remarkable for normal brain volume, no atrophy is noted.  Very mild small vessel chronic ischemic changes.  This patient is here alone. previous records as well as any outside records available were reviewed prior to todays visit.  At today's visit, the patient reports that his memory is "better, remembering things that I did not before, I have less stress now".  Recommendations Follow up as needed. No indication for antidementia medication at this time He is to consider Neuropsych evaluation if memory worsens. Continue to control mood as per PCP and psychotherapy and psychiatry.  He is on Wellbutrin XL Viibryd Recommend good control of cardiovascular risk factors   Initial visit 03/01/2023 How long did patient have memory difficulties? For the past year . "Family pointed them out and I trust them". Wife had an appt this morning at her doctor at the same time and he did not recall the conversation from last night. "Little details"-he says. He always had difficulty with names. He can read and concentrate at work but at home I don't. "I used to be an avid reader but now I do not pleasure read" "In grade school had shown behavioral problems, a lot of distraction, developing compensatory strategies" repeats oneself?  Endorsed, for the last year Disoriented when walking into a room?  Patient denies   Leaving objects in unusual places? denies   Wandering behavior?  denies   Any personality changes since last visit?  Increased irritability for more than 1 year, he feels is due to ADHD (not formally diagnosed)." At work I am hyper-focused but at home I don't work as hard to be focused or working  on clarity".   Any history of depression?:  Endorsed, especially since his wife had a serious medical problem and his mother recently died on Christmas day along with and other family and work stressors. "Psychotherapy helps a lot.  Hallucinations or paranoia?  Patient denies   Seizures?   Patient denies    Any sleep changes? He sleeps well. Endorsed  vivid dreams, REM behavior or sleepwalking   Sleep apnea?  Patient denies   Any hygiene concerns?  Patient denies   Independent of bathing and dressing?  Endorsed  Does the patient needs help with medications? Patient  is in charge   Who is in charge of the finances?  Patient ands wife are  charge     Any changes in appetite? Decreased over the last 3 months, he thinks is due to meds    Patient have trouble swallowing? denies   Does the patient cook?  Endorsed Any kitchen accidents such as leaving the stove on? denies   Any headaches?   denies   Chronic back pain ? He has back pain dafter over picking up weight  Ambulates with difficulty?  He admits to not exercising enough, as well as having bilateral knee arthritis which limit his mobility. Recent falls or head injuries? denies   Vision changes? denies   Unilateral weakness, numbness or tingling? denies   Any tremors?   denies   Any anosmia?  Denies  Any incontinence of urine? denies   Any bowel dysfunction?  denies      Patient lives  with his wife History of heavy alcohol intake? denies   History of heavy tobacco use?  denies   Family history of dementia? Paternal aunt with rapidly progressive dementia  (RPD) Does patient drive?  No issues , denies getting lost    He is a Optician, dispensing, stressful job.  Pertinent labs TSH 1.56, A1c 6.4  CURRENT MEDICATIONS:  Outpatient Encounter Medications as of 03/29/2023  Medication Sig   ALPRAZolam (XANAX) 0.5 MG tablet Take 1/2-1 tab as needed every 4-6 hours as needed for severe anxiety and panic (Patient not taking: Reported on 07/11/2022)   B  Complex Vitamins (VITAMIN B COMPLEX PO) Take by mouth.   buPROPion (WELLBUTRIN XL) 150 MG 24 hr tablet Take 1 tablet (150 mg total) by mouth daily.   cyclobenzaprine (FLEXERIL) 10 MG tablet Take 1 tablet (10 mg total) by mouth 2 (two) times daily as needed for muscle spasms.   fluticasone (FLONASE) 50 MCG/ACT nasal spray Place 1 spray into both nostrils daily. Begin by using 2 sprays in each nare daily for 3 to 5 days, then decrease to 1 spray in each nare daily.   losartan-hydrochlorothiazide (HYZAAR) 50-12.5 MG tablet TAKE 2 TABLETS DAILY   metFORMIN (GLUCOPHAGE-XR) 500 MG 24 hr tablet Take 3 tablets per day   Omega-3 Fatty Acids (FISH OIL PO) Take by mouth.   Probiotic Product (PROBIOTIC PO) Take 1 tablet by mouth every other day.   rosuvastatin (CRESTOR) 5 MG tablet Take 5 mg  by mouth 5 days per week for 2 weeks  and then increase to  5 mg 7 days per  week as tolerated (Patient taking differently: Take 5 mg by mouth. Three times a week)   Vilazodone HCl 20 MG TABS Take 1 tablet (20 mg total) by mouth daily with breakfast AND 0.5 tablets (10 mg total) daily with lunch.   No facility-administered encounter medications on file as of 03/29/2023.        No data to display            02/14/2023    8:00 AM  Montreal Cognitive Assessment   Visuospatial/ Executive (0/5) 4  Naming (0/3) 3  Attention: Read list of digits (0/2) 1  Attention: Read list of letters (0/1) 1  Attention: Serial 7 subtraction starting at 100 (0/3) 3  Language: Repeat phrase (0/2) 2  Language : Fluency (0/1) 1  Abstraction (0/2) 2  Delayed Recall (0/5) 5  Orientation (0/6) 6  Total 28  Adjusted Score (based on education) 28   Thank you for allowing [place] the opportunity to participate in the care of this nice patient. Please do not hesitate to contact us for any questions or concerns.   Total time spent on today's visit was 20 minutes dedicated to this patient today, preparing to see patient, examining the  patient, ordering tests and/or medications and counseling the patient, documenting clinical information in the EHR or other health record, independently interpreting results and communicating results to the patient/family, discussing treatment and goals, answering patient's questions and coordinating care.  Cc:  Madelin Headings, MD  Marlowe Kays 03/29/2023 7:02 AM

## 2023-04-06 ENCOUNTER — Other Ambulatory Visit: Payer: Self-pay | Admitting: Family

## 2023-04-20 ENCOUNTER — Ambulatory Visit: Payer: BC Managed Care – PPO | Admitting: Psychology

## 2023-04-20 DIAGNOSIS — Z630 Problems in relationship with spouse or partner: Secondary | ICD-10-CM | POA: Diagnosis not present

## 2023-04-20 DIAGNOSIS — F4323 Adjustment disorder with mixed anxiety and depressed mood: Secondary | ICD-10-CM

## 2023-04-20 DIAGNOSIS — F3342 Major depressive disorder, recurrent, in full remission: Secondary | ICD-10-CM | POA: Diagnosis not present

## 2023-04-20 DIAGNOSIS — F902 Attention-deficit hyperactivity disorder, combined type: Secondary | ICD-10-CM

## 2023-04-20 NOTE — Progress Notes (Signed)
[facility] Counselor/Therapist Progress Note  Patient ID: KHYLON EBERLING, MRN: [id number],    Date:04/20/2023  Time Spent: 60 minutes  Treatment Type: Individual Therapy  Reported Symptoms: sadness, inability to pay attention  Mental Status Exam: Appearance:  Casual     Behavior: Appropriate  Motor: Normal  Speech/Language:  Normal Rate  Affect: Appropriate  Mood: pleasant  Thought process: normal  Thought content:   WNL  Sensory/Perceptual disturbances:   WNL  Orientation: oriented to person, place, time/date, and situation  Attention: Good  Concentration: Good  Memory: WNL  Fund of knowledge:  Good  Insight:   Good  Judgment:  Good  Impulse Control: Good   Risk Assessment: Danger to Self:  No Self-injurious Behavior: No Danger to Others: No Duty to Warn:no Physical Aggression / Violence:No  Access to Firearms a concern: No  Gang Involvement:No   Subjective: The patient attended a face-to-face individual therapy session in the office today.  The patient presents with a blunted affect and mood is pleasant.  Today the patient talked more about concerns about his relationship with his wife.  The patient says that he and his wife have not been intimate in years and this has been a significant problem for them.  We discussed his feelings about this and I encouraged him to be more proactive in seeking out someone to mediate and to work on this issue.  He has been stuffing his feelings about this for years and it seems that it is and the boiling point at this point in time.  We talked about him pursuing this more assertively and that it was important for him to do this because it seems that he has been very understanding for a lot of years around his wife's depression but he also is not getting what he needs from the relationship.   Interventions: Cognitive Behavioral Therapy and Insight-Oriented  Diagnosis:Attention deficit hyperactivity disorder (ADHD),  combined type  Recurrent major depressive disorder, in full remission (HCC)  Adjustment disorder with mixed anxiety and depressed mood  Marital problems  Plan: Client Abilities/Strengths  Intelligent, insightful, motivated  Client Treatment Preferences  Outpatient individual therapy  Client Statement of Needs  "I decided I needed therapy to help me sort through a situation I am dealing with"  Treatment Level  Outpatient Individual therapy  Symptoms  Increased defensiveness with his wife: (Status: improved). Passive -  aggressive behaviors: (Status: improved).  Problems Addressed  adjustment disorder, Vocational Stress, Vocational Stress, Vocational Stress, Vocational Stress,  Vocational Stress  Goals 1. Decrease incidents of exhibiting passive aggressive behavior Objective Learn and implement problem-solving skills. Target Date: 2023-09-16 Frequency: Monthly Progress: 90 Modality: individual Related Interventions 1. Conduct Problem-Solving Therapy (see Problem-Solving Therapy by Domenick Bookbinder and Rob Hickman) using techniques such as psychoeducation, modeling, and role-playing to teach the client problem solving skills (i.e., defining a problem specifically, generating possible solutions, evaluating the  pros and cons of each solution, selecting and implementing a plan of action, evaluating the  efficacy of the plan, accepting or revising the plan); role-play application of the problemsolving skill to a real life issue (or assign "Applying Problem-Solving to Interpersonal Conflict" in the Adult Psychotherapy Homework Planner by Stephannie Li). 2. Improve satisfaction and comfort surrounding coworker relationships. 3. Increase job satisfaction and performance due to implementation of  assertiveness and stress management strategies. 4. Increase sense of confidence and competence in dealing with work  responsibilities. Objective Verbalize healthy, realistic cognitive messages that promote  harmony with others, self-acceptance,  and  self-confidence. Target Date: 2023-09-16 Frequency: Monthly Progress: 90 Modality: individual 5. Increase sense of self-esteem and elevation of mood in spite of  unemployment. 6. Pursue employment consistency with a reasonably hopeful and positive attitude. Diagnosis Adult ADD 296.31 (Major depressive affective disorder, recurrent episode, mild) - 309.28 (Adjustment disorder with mixed anxiety and depressed mood) - Conditions For Discharge Achievement of treatment goals and objectives  The patient approved this plan and is making progress.  Arn Mcomber G Kacper Cartlidge, LCSW

## 2023-05-01 ENCOUNTER — Other Ambulatory Visit: Payer: Self-pay | Admitting: Internal Medicine

## 2023-05-01 ENCOUNTER — Other Ambulatory Visit: Payer: Self-pay | Admitting: Psychiatry

## 2023-05-01 DIAGNOSIS — F3342 Major depressive disorder, recurrent, in full remission: Secondary | ICD-10-CM

## 2023-05-01 DIAGNOSIS — F902 Attention-deficit hyperactivity disorder, combined type: Secondary | ICD-10-CM

## 2023-05-08 ENCOUNTER — Telehealth: Payer: Self-pay | Admitting: Internal Medicine

## 2023-05-08 ENCOUNTER — Other Ambulatory Visit: Payer: Self-pay | Admitting: Psychiatry

## 2023-05-08 DIAGNOSIS — F410 Panic disorder [episodic paroxysmal anxiety] without agoraphobia: Secondary | ICD-10-CM

## 2023-05-08 DIAGNOSIS — F3342 Major depressive disorder, recurrent, in full remission: Secondary | ICD-10-CM

## 2023-05-08 NOTE — Telephone Encounter (Signed)
Prescription Request  05/08/2023  LOV: 03/07/2023  What is the name of the medication or equipment? Metformin. Pt states he has 2 days worth of prescription left.   Have you contacted your pharmacy to request a refill? Yes   Which pharmacy would you like this sent to? [id number] [place], [place] - [address] [place] [address] Phone: [phone] Fax: [phone]  Patient notified that their request is being sent to the clinical staff for review and that they should receive a response within 2 business days.   Please advise at Mobile [phone] (mobile)

## 2023-05-09 MED ORDER — METFORMIN HCL ER 500 MG PO TB24: ORAL_TABLET | ORAL | 1 refills | Status: DC

## 2023-05-09 NOTE — Telephone Encounter (Signed)
Rx sent to pharmacy.   Attempted to reach pt. Left a detail message and call us back if have any questions.

## 2023-05-09 NOTE — Telephone Encounter (Signed)
Checking on progress of refill request. Patient upset that his prescription wasn't filled as quickly as he would like. Made him aware of new provider schedule.

## 2023-05-10 NOTE — Telephone Encounter (Signed)
2nd attempt to reach pt. Left a voicemail to call us back.

## 2023-05-22 DIAGNOSIS — H2513 Age-related nuclear cataract, bilateral: Secondary | ICD-10-CM | POA: Diagnosis not present

## 2023-05-22 DIAGNOSIS — E119 Type 2 diabetes mellitus without complications: Secondary | ICD-10-CM | POA: Diagnosis not present

## 2023-05-22 DIAGNOSIS — H35033 Hypertensive retinopathy, bilateral: Secondary | ICD-10-CM | POA: Diagnosis not present

## 2023-05-25 ENCOUNTER — Ambulatory Visit (INDEPENDENT_AMBULATORY_CARE_PROVIDER_SITE_OTHER): Payer: BC Managed Care – PPO | Admitting: Psychology

## 2023-05-25 DIAGNOSIS — F4323 Adjustment disorder with mixed anxiety and depressed mood: Secondary | ICD-10-CM | POA: Diagnosis not present

## 2023-05-25 DIAGNOSIS — F3342 Major depressive disorder, recurrent, in full remission: Secondary | ICD-10-CM

## 2023-05-25 DIAGNOSIS — Z630 Problems in relationship with spouse or partner: Secondary | ICD-10-CM | POA: Diagnosis not present

## 2023-05-25 DIAGNOSIS — F902 Attention-deficit hyperactivity disorder, combined type: Secondary | ICD-10-CM

## 2023-05-25 NOTE — Progress Notes (Addendum)
[facility] Counselor/Therapist Progress Not Patient ID: RAMIR MALERBA, MRN: [id number],    Date:05/25/2023  Time Spent: 60 minutes  Time in:  8:00  Time out:9:00  Treatment Type: Individual Therapy  Reported Symptoms: sadness, inability to pay attention  Mental Status Exam: Appearance:  Casual     Behavior: Appropriate  Motor: Normal  Speech/Language:  Normal Rate  Affect: Appropriate  Mood: pleasant  Thought process: normal  Thought content:   WNL  Sensory/Perceptual disturbances:   WNL  Orientation: oriented to person, place, time/date, and situation  Attention: Good  Concentration: Good  Memory: WNL  Fund of knowledge:  Good  Insight:   Good  Judgment:  Good  Impulse Control: Good   Risk Assessment: Danger to Self:  No Self-injurious Behavior: No Danger to Others: No Duty to Warn:no Physical Aggression / Violence:No  Access to Firearms a concern: No  Gang Involvement:No   Subjective: The patient attended a face-to-face individual therapy session in the office today.  The patient presents with a blunted affect and mood is pleasant.  The patient reports that he feels more stable and consistent now.  The patient states that things have settled down a little bit.  He talked about he and his wife having a conversation about the intimacy and they are working on getting an appointment for them to have more therapy to help them work on that issue.  We talked a little today about the patient's eagerness to get on with moving forward with his life and his potential retirement.  He seems to think that once his mother's estate is settled in might be helpful for them to be able to retire.  He is trying to get back into doing his morning papers and he reports that he is reading a book on meditation and mindfulness.  Provided supportive therapy today and also some problem solving.  Interventions: Cognitive Behavioral Therapy and  Insight-Oriented  Diagnosis:Attention deficit hyperactivity disorder (ADHD), combined type  Recurrent major depressive disorder, in full remission (HCC)  Adjustment disorder with mixed anxiety and depressed mood  Marital problems  Plan: Client Abilities/Strengths  Intelligent, insightful, motivated  Client Treatment Preferences  Outpatient individual therapy  Client Statement of Needs  "I decided I needed therapy to help me sort through a situation I am dealing with"  Treatment Level  Outpatient Individual therapy  Symptoms  Increased defensiveness with his wife: (Status: improved). Passive -  aggressive behaviors: (Status: improved).  Problems Addressed  adjustment disorder, Vocational Stress, Vocational Stress, Vocational Stress, Vocational Stress,  Vocational Stress  Goals 1. Decrease incidents of exhibiting passive aggressive behavior Objective Learn and implement problem-solving skills. Target Date: 2023-09-16 Frequency: Monthly Progress: 90 Modality: individual Related Interventions 1. Conduct Problem-Solving Therapy (see Problem-Solving Therapy by Domenick Bookbinder and Rob Hickman) using techniques such as psychoeducation, modeling, and role-playing to teach the client problem solving skills (i.e., defining a problem specifically, generating possible solutions, evaluating the  pros and cons of each solution, selecting and implementing a plan of action, evaluating the  efficacy of the plan, accepting or revising the plan); role-play application of the problemsolving skill to a real life issue (or assign "Applying Problem-Solving to Interpersonal Conflict" in the Adult Psychotherapy Homework Planner by Stephannie Li). 2. Improve satisfaction and comfort surrounding coworker relationships. 3. Increase job satisfaction and performance due to implementation of  assertiveness and stress management strategies. 4. Increase sense of confidence and competence in dealing with work   responsibilities. Objective Verbalize healthy, realistic cognitive messages  that promote harmony with others, self-acceptance, and  self-confidence. Target Date: 2023-09-16 Frequency: Monthly Progress: 90 Modality: individual 5. Increase sense of self-esteem and elevation of mood in spite of  unemployment. 6. Pursue employment consistency with a reasonably hopeful and positive attitude. Diagnosis Adult ADD 296.31 (Major depressive affective disorder, recurrent episode, mild) - 309.28 (Adjustment disorder with mixed anxiety and depressed mood) - Conditions For Discharge Achievement of treatment goals and objectives  The patient approved this plan and is making progress.  Kemaya Dorner G Barett Whidbee, LCSW

## 2023-05-31 ENCOUNTER — Other Ambulatory Visit: Payer: Self-pay | Admitting: Internal Medicine

## 2023-06-06 ENCOUNTER — Other Ambulatory Visit: Payer: Self-pay | Admitting: Psychiatry

## 2023-06-06 DIAGNOSIS — F410 Panic disorder [episodic paroxysmal anxiety] without agoraphobia: Secondary | ICD-10-CM

## 2023-06-06 DIAGNOSIS — F3342 Major depressive disorder, recurrent, in full remission: Secondary | ICD-10-CM

## 2023-06-06 NOTE — Telephone Encounter (Signed)
Sent MyChart message to schedule appt.

## 2023-06-09 NOTE — Telephone Encounter (Signed)
Please call to schedule an appt. Did not respond to MyChart message.

## 2023-06-14 ENCOUNTER — Telehealth: Payer: Self-pay | Admitting: Psychiatry

## 2023-06-14 NOTE — Telephone Encounter (Signed)
Error

## 2023-06-14 NOTE — Telephone Encounter (Signed)
Next appt is 06/29/23. Requesting a refill on his Vilazadone and Bupropion called to:  [id number] [place], [place] - [address]   Phone: [phone]  Fax: [phone]

## 2023-06-14 NOTE — Telephone Encounter (Signed)
Rx sent for vilazodone. Rx for 90-day supply of bupropion was sent 5/29, if filled should not need a RF. LVM with info.

## 2023-06-14 NOTE — Telephone Encounter (Signed)
Pt called as advised by pharmacy. Requesting Rx for Vilzodone 20 mg 1.5/daily #45. Advised Pt over due for f/u. Pt will call back to schedule was driving and has to look at calendar.

## 2023-06-27 ENCOUNTER — Ambulatory Visit: Payer: BC Managed Care – PPO | Admitting: Psychology

## 2023-06-27 DIAGNOSIS — F902 Attention-deficit hyperactivity disorder, combined type: Secondary | ICD-10-CM

## 2023-06-27 DIAGNOSIS — Z630 Problems in relationship with spouse or partner: Secondary | ICD-10-CM

## 2023-06-27 DIAGNOSIS — F3342 Major depressive disorder, recurrent, in full remission: Secondary | ICD-10-CM

## 2023-06-27 DIAGNOSIS — F4323 Adjustment disorder with mixed anxiety and depressed mood: Secondary | ICD-10-CM | POA: Diagnosis not present

## 2023-06-27 NOTE — Progress Notes (Signed)
[facility] Counselor/Therapist Progress Note  Patient ID: Jonathan [name], MRN: [id number],    Date:06/27/2023  Time Spent: 60 minutes  Time in:  8:00  Time out: 9:00  Treatment Type: Individual Therapy  Reported Symptoms: sadness, inability to pay attention  Mental Status Exam: Appearance:  Casual     Behavior: Appropriate  Motor: Normal  Speech/Language:  Normal Rate  Affect: Appropriate  Mood: pleasant  Thought process: normal  Thought content:   WNL  Sensory/Perceptual disturbances:   WNL  Orientation: oriented to person, place, time/date, and situation  Attention: Good  Concentration: Good  Memory: WNL  Fund of knowledge:  Good  Insight:   Good  Judgment:  Good  Impulse Control: Good   Risk Assessment: Danger to Self:  No Self-injurious Behavior: No Danger to Others: No Duty to Warn:no Physical Aggression / Violence:No  Access to Firearms a concern: No  Gang Involvement:No   Subjective: The patient attended a face-to-face individual therapy session in the office today.  The patient presents with a blunted affect and mood is pleasant.  The patient reports that things are going okay.  He states that he went down to [facility] to see his brother and apparently his wife's brother had to have a toe amputated and she had to go there and stay for a few weeks.  We talked about how life does not seem to be settling down very much from week to week.  The patient talked a little bit about wanting to get started with planning his life with his retirement and his wife.  We talked about what possibilities he might have and did some brainstorming about how to develop what it is that he wants.  The patient reports that his wife and he are going to marriage counseling in a couple of days and he wants to continue to do therapy about every month.  Interventions: Cognitive Behavioral Therapy and Insight-Oriented  Diagnosis:Attention deficit hyperactivity disorder (ADHD),  combined type  Recurrent major depressive disorder, in full remission (HCC)  Adjustment disorder with mixed anxiety and depressed mood  Marital problems  Plan: Client Abilities/Strengths  Intelligent, insightful, motivated  Client Treatment Preferences  Outpatient individual therapy  Client Statement of Needs  "I decided I needed therapy to help me sort through a situation I am dealing with"  Treatment Level  Outpatient Individual therapy  Symptoms  Increased defensiveness with his wife: (Status: improved). Passive -  aggressive behaviors: (Status: improved).  Problems Addressed  adjustment disorder, Vocational Stress, Vocational Stress, Vocational Stress, Vocational Stress,  Vocational Stress  Goals 1. Decrease incidents of exhibiting passive aggressive behavior Objective Learn and implement problem-solving skills. Target Date: 2023-09-16 Frequency: Monthly Progress: 90 Modality: individual Related Interventions 1. Conduct Problem-Solving Therapy (see Problem-Solving Therapy by Domenick Bookbinder and Rob Hickman) using techniques such as psychoeducation, modeling, and role-playing to teach the client problem solving skills (i.e., defining a problem specifically, generating possible solutions, evaluating the  pros and cons of each solution, selecting and implementing a plan of action, evaluating the  efficacy of the plan, accepting or revising the plan); role-play application of the problemsolving skill to a real life issue (or assign "Applying Problem-Solving to Interpersonal Conflict" in the Adult Psychotherapy Homework Planner by Stephannie Li). 2. Improve satisfaction and comfort surrounding coworker relationships. 3. Increase job satisfaction and performance due to implementation of  assertiveness and stress management strategies. 4. Increase sense of confidence and competence in dealing with work  responsibilities. Objective Verbalize healthy, realistic cognitive messages that promote  harmony with others, self-acceptance, and  self-confidence. Target Date: 2023-09-16 Frequency: Monthly Progress: 90 Modality: individual 5. Increase sense of self-esteem and elevation of mood in spite of  unemployment. 6. Pursue employment consistency with a reasonably hopeful and positive attitude. Diagnosis Adult ADD 296.31 (Major depressive affective disorder, recurrent episode, mild) - 309.28 (Adjustment disorder with mixed anxiety and depressed mood) - Conditions For Discharge Achievement of treatment goals and objectives  The patient approved this plan and is making progress.  Spruha Weight G Jolena Kittle, LCSW

## 2023-06-29 ENCOUNTER — Encounter: Payer: Self-pay | Admitting: Psychiatry

## 2023-06-29 ENCOUNTER — Ambulatory Visit: Payer: BC Managed Care – PPO | Admitting: Psychiatry

## 2023-06-29 DIAGNOSIS — F410 Panic disorder [episodic paroxysmal anxiety] without agoraphobia: Secondary | ICD-10-CM | POA: Diagnosis not present

## 2023-06-29 DIAGNOSIS — F902 Attention-deficit hyperactivity disorder, combined type: Secondary | ICD-10-CM | POA: Diagnosis not present

## 2023-06-29 DIAGNOSIS — F3342 Major depressive disorder, recurrent, in full remission: Secondary | ICD-10-CM | POA: Diagnosis not present

## 2023-06-29 MED ORDER — VILAZODONE HCL 20 MG PO TABS: ORAL_TABLET | ORAL | 1 refills | Status: DC

## 2023-06-29 MED ORDER — BUPROPION HCL ER (XL) 150 MG PO TB24: 150.0000 mg | ORAL_TABLET | Freq: Every day | ORAL | 1 refills | Status: DC

## 2023-06-29 MED ORDER — ALPRAZOLAM 0.5 MG PO TABS: ORAL_TABLET | ORAL | 0 refills | Status: DC

## 2023-06-29 NOTE — Progress Notes (Signed)
Jonathan [name] [id number] June 07, 1961 62 y.o.  Subjective:   Patient ID:  Jonathan [name] is a 62 y.o. (DOB 07/25/1961) male.  Chief Complaint:  Chief Complaint  Patient presents with   Anxiety   Sleeping Problem   Follow-up    Depression    Anxiety     Jonathan [name] presents to the office today for follow-up of depression, anxiety, ADHD, and sleep disturbance.   "I'm doing ok." He feels like combination of Viibryd and Wellbutrin XL has been helpful. He reports that Wellbutrin XL seems to be helpful for energy and motivation. He reports that he occasionally will miss mid-day dose of Viibryd- "it's not a make or break, but I can tell a difference when I don't have it." He reports that he feels more focused with medication. He reports that after 2-3 pm he is still accomplishing things and has energy, and concentration is slightly less. He reports that he has had some anxiety at night and none during the daytime.  He reports that he did not sleep well last night and tossed and turned. He reports that his wife has been out of town. He reports that last night he noticed the start of some anxiety that did not progress to panic. He notices some tossing and turning some nights. He will have thoughts about when his father was on a ventilator. He reports that his appetite has been somewhat suppressed. Denies SI.   He denies depressed mood. He reports, "my irritability is so much less." He reports that his wife and daughter have both commented that he is less reactive.   He reports that he was out of Viibryd for 1-2 days.   He has not needed Xanax prn recently.   Wife is out of town to help with her father and brother's health needs. Daughter is 67 yo and has been doing ok.   Past Psychiatric Medication Trials: Citalopram- Sexual side effects, fatigue. Partially effective for depression. Increased to 15 mg in the last month and noticed more fatigue. He reports that he has been taking  10 mg most of the time since then.  Trintellix- Improved mood, anxiety, and focus on 5 mg. Excessive somnolence on 10 mg. Pristiq Wellbutrin- Took 25 years ago. Cannot recall response. Concerta- Effective. Raised BP. Caused jitteriness.  Modafinil-Helpful for concentration. Caused some edginess   GAD-7    Flowsheet Row Office Visit from 03/07/2023 in [facility] [place] Office Visit from 01/24/2023 in [facility] [place]  Total GAD-7 Score 5 9      PHQ2-9    Flowsheet Row Office Visit from 03/07/2023 in [facility] [place] Office Visit from 01/24/2023 in [facility] [place] Video Visit from 07/11/2022 in [facility] [place] Office Visit from 05/05/2021 in [facility] [place] Office Visit from 10/19/2020 in [facility] [place]  PHQ-2 Total Score 2 1 3 2  0  PHQ-9 Total Score 10 5 12 12 9       Flowsheet Row ED from 02/20/2023 in [facility]) ED from 02[facility]16/2023 in [facility]) ED from 03/21/2022 in [facility])  C-SSRS RISK CATEGORY No Risk No Risk No Risk        Review of Systems:  Review of Systems  Gastrointestinal: Negative.   Musculoskeletal:  Negative for  gait problem.  Neurological:  Negative for tremors.  Psychiatric/Behavioral:         Please refer to HPI    Medications: I have reviewed the patient's current medications.  Current Outpatient Medications  Medication Sig Dispense Refill   ALPRAZolam (XANAX) 0.5 MG tablet Take 1/2-1 tab as needed every 4-6 hours as needed for severe anxiety and panic 30 tablet 0   B Complex Vitamins (VITAMIN B COMPLEX PO) Take by mouth.     buPROPion (WELLBUTRIN XL) 150 MG 24 hr tablet Take 1 tablet (150 mg total) by mouth daily. 90 tablet 1    cyclobenzaprine (FLEXERIL) 10 MG tablet Take 1 tablet (10 mg total) by mouth 2 (two) times daily as needed for muscle spasms. 10 tablet 0   fluticasone (FLONASE) 50 MCG/ACT nasal spray Place 1 spray into both nostrils daily. Begin by using 2 sprays in each nare daily for 3 to 5 days, then decrease to 1 spray in each nare daily. 32 mL 1   losartan-hydrochlorothiazide (HYZAAR) 50-12.5 MG tablet TAKE 2 TABLETS DAILY 180 tablet 3   metFORMIN (GLUCOPHAGE-XR) 500 MG 24 hr tablet Take 3 tablets per day 270 tablet 1   Omega-3 Fatty Acids (FISH OIL PO) Take by mouth.     Probiotic Product (PROBIOTIC PO) Take 1 tablet by mouth every other day.     rosuvastatin (CRESTOR) 5 MG tablet TAKE 1 TABLET BY MOUTH ONCE WEEKLY FOR 2 WEEKS, THEN INCREASE TO 3 TIMES PER WEEK OR AS DIRECTED 36 tablet 3   Vilazodone HCl 20 MG TABS Take 1 tablet (20 mg total) by mouth daily with breakfast AND 0.5 tablets (10 mg total) daily with supper. 135 tablet 1   No current facility-administered medications for this visit.    Medication Side Effects: None  Allergies:  Allergies  Allergen Reactions   Statins Other (See Comments)    myalgias     Zocor [Simvastatin]      myalgias   Penicillins Hives    Per pt - happened in childhood - hives possible reaction    Past Medical History:  Diagnosis Date   Allergic rhinitis    Allergy    Anxiety    Depression    Diabetes mellitus without complication (HCC)    type 2   Heart murmur    per pt told it was functional   History of kidney stones    History of nephrolithiasis    Hyperlipidemia    Hypertension    Sebaceous cyst     Past Medical History, Surgical history, Social history, and Family history were reviewed and updated as appropriate.   Please see review of systems for further details on the patient's review from today.   Objective:   Physical Exam:  There were no vitals taken for this visit.  Physical Exam Constitutional:      General: He is not in  acute distress. Musculoskeletal:        General: No deformity.  Neurological:     Mental Status: He is alert and oriented to person, place, and time.     Coordination: Coordination normal.  Psychiatric:        Attention and Perception: Attention and perception normal. He does not perceive auditory or visual hallucinations.        Mood and Affect: Mood normal. Mood is not anxious or depressed. Affect is not labile, blunt, angry or inappropriate.        Speech: Speech normal.  Behavior: Behavior normal.        Thought Content: Thought content normal. Thought content is not paranoid or delusional. Thought content does not include homicidal or suicidal ideation. Thought content does not include homicidal or suicidal plan.        Cognition and Memory: Cognition and memory normal.        Judgment: Judgment normal.     Comments: Insight intact     Lab Review:     Component Value Date/Time   NA 140 01/23/2023 0851   NA 141 08/17/2021 0000   K 4.2 01/23/2023 0851   CL 103 01/23/2023 0851   CO2 28 01/23/2023 0851   GLUCOSE 117 (H) 01/23/2023 0851   BUN 13 01/23/2023 0851   CREATININE 1.31 01/23/2023 0851   CREATININE 1.15 10/19/2020 1206   CALCIUM 9.7 01/23/2023 0851   PROT 6.6 01/23/2023 0851   ALBUMIN 4.5 01/23/2023 0851   AST 21 01/23/2023 0851   ALT 22 01/23/2023 0851   ALKPHOS 56 01/23/2023 0851   BILITOT 0.8 01/23/2023 0851   GFRNONAA 72.1 08/17/2021 0000   GFRNONAA 68 10/19/2020 1206   GFRAA 83.5 08/17/2021 0000   GFRAA 79 10/19/2020 1206       Component Value Date/Time   WBC 10.3 01/23/2023 0851   RBC 5.36 01/23/2023 0851   HGB 16.7 01/23/2023 0851   HCT 48.6 01/23/2023 0851   PLT 246.0 01/23/2023 0851   MCV 90.7 01/23/2023 0851   MCV 94.2 04/29/2013 0936   MCH 30.9 10/19/2020 1206   MCHC 34.4 01/23/2023 0851   RDW 13.0 01/23/2023 0851   LYMPHSABS 2.7 01/23/2023 0851   MONOABS 0.8 01/23/2023 0851   EOSABS 0.6 01/23/2023 0851   BASOSABS 0.1 01/23/2023 0851     No results found for: "POCLITH", "LITHIUM"   No results found for: "PHENYTOIN", "PHENOBARB", "VALPROATE", "CBMZ"   .res Assessment: Plan:    30 minutes spent dedicated to the care of this patient on the date of this encounter to include pre-visit review of records, ordering of medication, post visit documentation, and face-to-face time with the patient discussing recent sleep disturbance and anxiety at night. Discussed trial of moving mid-day dose of Viibryd to supper time since he reports that he is occasionally missing doses at lunchtime if he goes out to lunch or forgets to bring medications with him. Discussed that he may be experiencing some mild discontinuation at night on the days he does not take Viibryd mid-day since Viibryd has a short half-life, his anxiety only seems to occur at night and not during the daytime, and his symptoms were better controlled with moving from once daily to twice daily dosing in the past. Discussed that Viibryd closer to bedtime may cause some mild sleep disturbance and to monitor for this.  Will continue Viibryd 20 mg daily in the morning and move administration time of 10 mg dose to supper.  Will continue Wellbutrin XL 150 mg daily for depression and off-label indication for ADHD.  Recommend continuing therapy with Bambi Cottle, LCSW. Recommended discussing recurrent thought about father being on a ventilator with therapist.  Will send in script for Alprazolam prn anxiety and encouraged pt to take Alprazolam prn if he experiences onset of acute anxiety.  Pt to follow-up in 3 months or sooner if clinically indicated.  Patient advised to contact office with any questions, adverse effects, or acute worsening in signs and symptoms.    Gerald was seen today for anxiety, sleeping problem and follow-up.  Diagnoses  and all orders for this visit:  Panic -     Vilazodone HCl 20 MG TABS; Take 1 tablet (20 mg total) by mouth daily with breakfast AND 0.5 tablets  (10 mg total) daily with supper. -     ALPRAZolam (XANAX) 0.5 MG tablet; Take 1/2-1 tab as needed every 4-6 hours as needed for severe anxiety and panic  Recurrent major depressive disorder, in full remission (HCC) -     Vilazodone HCl 20 MG TABS; Take 1 tablet (20 mg total) by mouth daily with breakfast AND 0.5 tablets (10 mg total) daily with supper. -     buPROPion (WELLBUTRIN XL) 150 MG 24 hr tablet; Take 1 tablet (150 mg total) by mouth daily.  Attention deficit hyperactivity disorder (ADHD), combined type -     buPROPion (WELLBUTRIN XL) 150 MG 24 hr tablet; Take 1 tablet (150 mg total) by mouth daily.     Please see After Visit Summary for patient specific instructions.  Future Appointments  Date Time Provider Department Center  07/25/2023  8:00 AM Cottle, Bambi G, LCSW [facility] None  08/31/2023  8:00 AM Cottle, Bambi G, LCSW [facility] None    No orders of the defined types were placed in this encounter.   -------------------------------

## 2023-07-20 ENCOUNTER — Telehealth: Payer: Self-pay | Admitting: Psychiatry

## 2023-07-20 NOTE — Telephone Encounter (Signed)
Pharmacy sent PA Renewal Request for Vilazodone 20mg   , see CMM

## 2023-07-21 ENCOUNTER — Telehealth: Payer: Self-pay

## 2023-07-21 NOTE — Telephone Encounter (Signed)
Pending with [facility]

## 2023-07-21 NOTE — Telephone Encounter (Signed)
Prior Authorization renewal received for VILAZODONE 20 MG TAKE 1.5 TABS DAILY, pending with [facility]

## 2023-07-21 NOTE — Telephone Encounter (Signed)
Prior Approval received PA# [id number];  Start Date:06/21/2023;Coverage End Date:07/20/2024 with [facility]

## 2023-07-21 NOTE — Telephone Encounter (Signed)
Approval received effective 06/21/2023-07/20/2024, PA# [id number]

## 2023-07-22 ENCOUNTER — Other Ambulatory Visit: Payer: Self-pay | Admitting: Internal Medicine

## 2023-07-25 ENCOUNTER — Ambulatory Visit: Payer: BC Managed Care – PPO | Admitting: Psychology

## 2023-07-25 DIAGNOSIS — Z630 Problems in relationship with spouse or partner: Secondary | ICD-10-CM

## 2023-07-25 DIAGNOSIS — F902 Attention-deficit hyperactivity disorder, combined type: Secondary | ICD-10-CM | POA: Diagnosis not present

## 2023-07-25 DIAGNOSIS — F3342 Major depressive disorder, recurrent, in full remission: Secondary | ICD-10-CM | POA: Diagnosis not present

## 2023-07-25 DIAGNOSIS — F4323 Adjustment disorder with mixed anxiety and depressed mood: Secondary | ICD-10-CM | POA: Diagnosis not present
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
## 2023-08-31 ENCOUNTER — Ambulatory Visit: Payer: BC Managed Care – PPO | Admitting: Psychology

## 2023-08-31 DIAGNOSIS — Z630 Problems in relationship with spouse or partner: Secondary | ICD-10-CM

## 2023-08-31 DIAGNOSIS — F3342 Major depressive disorder, recurrent, in full remission: Secondary | ICD-10-CM | POA: Diagnosis not present

## 2023-08-31 DIAGNOSIS — F902 Attention-deficit hyperactivity disorder, combined type: Secondary | ICD-10-CM

## 2023-08-31 DIAGNOSIS — F4323 Adjustment disorder with mixed anxiety and depressed mood: Secondary | ICD-10-CM | POA: Diagnosis not present

## 2023-09-01 NOTE — Progress Notes (Signed)
[facility] Counselor/Therapist Progress Note  Patient ID: LEEUM SANKEY, MRN: [id number],    Date:08/31/2023  Time Spent: 60 minutes  Time in:  8:00  Time out: 9:00  Treatment Type: Individual Therapy  Reported Symptoms: sadness, inability to pay attention  Mental Status Exam: Appearance:  Casual     Behavior: Appropriate  Motor: Normal  Speech/Language:  Normal Rate  Affect: Appropriate  Mood: pleasant  Thought process: normal  Thought content:   WNL  Sensory/Perceptual disturbances:   WNL  Orientation: oriented to person, place, time/date, and situation  Attention: Good  Concentration: Good  Memory: WNL  Fund of knowledge:  Good  Insight:   Good  Judgment:  Good  Impulse Control: Good   Risk Assessment: Danger to Self:  No Self-injurious Behavior: No Danger to Others: No Duty to Warn:no Physical Aggression / Violence:No  Access to Firearms a concern: No  Gang Involvement:No   Subjective: The patient attended a face-to-face individual therapy session in the office today.  The patient asked if his wife could be involved in the session today and I had said that that would be fine.  They both came in and the patient presents with a blunted affect and mood is pleasant.  The patient's wife talked about her concerns about him being somewhat frazzled upon occasion.  She gave me some examples of what has happened and some of that could be related to his ADD.  We talked about the dynamics that are happening and discussed their level of frustration in regard to retirement and stress.  The patient's wife is much more detail oriented and struggles when the patient cannot be more that way.  We talked about the differences between the 2 of their personalities and also the level of stress that both of them have been under with loss of parents and work and the potential for retirement coming up.  The take away his for the session today were they both need to be  mindful of each other's personalities and themselves and their level of stress.  We also talked about the patient possibly possibly going to have a sleep apnea study because he has been more forgetful and his wife and daughter are concerned about that.  He did have some cognitive testing and that came back okay.  In addition we talked about strategies for him to use to take a deep breath before he has conversation and also to make notes so that he is not veering off his script as far as big conversations that he needs to have with his wife.  I also recommended they consider going to a financial advisor just to look at their financial situation to make sure that they are not on track so that they would have more security about when to retire.   Interventions: Cognitive Behavioral Therapy and Insight-Oriented  Diagnosis:Recurrent major depressive disorder, in full remission (HCC)  Attention deficit hyperactivity disorder (ADHD), combined type  Adjustment disorder with mixed anxiety and depressed mood  Marital problems  Plan: Client Abilities/Strengths  Intelligent, insightful, motivated  Client Treatment Preferences  Outpatient individual therapy  Client Statement of Needs  "I decided I needed therapy to help me sort through a situation I am dealing with"  Treatment Level  Outpatient Individual therapy  Symptoms  Increased defensiveness with his wife: (Status: improved). Passive -  aggressive behaviors: (Status: improved).  Problems Addressed  adjustment disorder, Vocational Stress, Vocational Stress, Vocational Stress, Vocational Stress,  Vocational  Stress  Goals 1. Decrease incidents of exhibiting passive aggressive behavior Objective Learn and implement problem-solving skills. Target Date: 2024-09-15 Frequency: Monthly Progress: 90 Modality: individual Related Interventions 1. Conduct Problem-Solving Therapy (see Problem-Solving Therapy by Domenick Bookbinder and Rob Hickman) using techniques such  as psychoeducation, modeling, and role-playing to teach the client problem solving skills (i.e., defining a problem specifically, generating possible solutions, evaluating the  pros and cons of each solution, selecting and implementing a plan of action, evaluating the  efficacy of the plan, accepting or revising the plan); role-play application of the problemsolving skill to a real life issue (or assign "Applying Problem-Solving to Interpersonal Conflict" in the Adult Psychotherapy Homework Planner by Stephannie Li). 2. Improve satisfaction and comfort surrounding coworker relationships. 3. Increase job satisfaction and performance due to implementation of  assertiveness and stress management strategies. 4. Increase sense of confidence and competence in dealing with work  responsibilities. Objective Verbalize healthy, realistic cognitive messages that promote harmony with others, self-acceptance, and  self-confidence. Target Date: 2024-09-15 Frequency: Monthly Progress: 90 Modality: individual 5. Increase sense of self-esteem and elevation of mood in spite of  unemployment. 6. Pursue employment consistency with a reasonably hopeful and positive attitude. Diagnosis Adult ADD 296.31 (Major depressive affective disorder, recurrent episode, mild) - 309.28 (Adjustment disorder with mixed anxiety and depressed mood) - Conditions For Discharge Achievement of treatment goals and objectives  The patient approved this plan and is making progress.  Caven Perine G Lekisha Mcghee, LCSW

## 2023-09-07 DIAGNOSIS — L820 Inflamed seborrheic keratosis: Secondary | ICD-10-CM | POA: Diagnosis not present

## 2023-09-07 DIAGNOSIS — D235 Other benign neoplasm of skin of trunk: Secondary | ICD-10-CM | POA: Diagnosis not present

## 2023-09-07 DIAGNOSIS — D485 Neoplasm of uncertain behavior of skin: Secondary | ICD-10-CM | POA: Diagnosis not present

## 2023-09-07 DIAGNOSIS — D2362 Other benign neoplasm of skin of left upper limb, including shoulder: Secondary | ICD-10-CM | POA: Diagnosis not present

## 2023-09-07 DIAGNOSIS — L738 Other specified follicular disorders: Secondary | ICD-10-CM | POA: Diagnosis not present

## 2023-09-15 ENCOUNTER — Encounter: Payer: Self-pay | Admitting: Psychiatry

## 2023-09-15 ENCOUNTER — Ambulatory Visit: Payer: BC Managed Care – PPO | Admitting: Psychiatry

## 2023-09-15 DIAGNOSIS — F410 Panic disorder [episodic paroxysmal anxiety] without agoraphobia: Secondary | ICD-10-CM | POA: Diagnosis not present

## 2023-09-15 DIAGNOSIS — F3342 Major depressive disorder, recurrent, in full remission: Secondary | ICD-10-CM | POA: Diagnosis not present

## 2023-09-15 DIAGNOSIS — F902 Attention-deficit hyperactivity disorder, combined type: Secondary | ICD-10-CM | POA: Diagnosis not present

## 2023-09-15 MED ORDER — BUPROPION HCL ER (XL) 150 MG PO TB24: 150.0000 mg | ORAL_TABLET | Freq: Every day | ORAL | 1 refills | Status: DC

## 2023-09-15 MED ORDER — BUPROPION HCL ER (SR) 100 MG PO TB12: 100.0000 mg | ORAL_TABLET | Freq: Every day | ORAL | 1 refills | Status: DC
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
## 2023-09-28 ENCOUNTER — Ambulatory Visit: Payer: BC Managed Care – PPO | Admitting: Psychology

## 2023-09-28 DIAGNOSIS — F3342 Major depressive disorder, recurrent, in full remission: Secondary | ICD-10-CM

## 2023-09-28 DIAGNOSIS — Z630 Problems in relationship with spouse or partner: Secondary | ICD-10-CM

## 2023-09-28 DIAGNOSIS — F902 Attention-deficit hyperactivity disorder, combined type: Secondary | ICD-10-CM

## 2023-09-28 NOTE — Progress Notes (Signed)
[facility] Counselor/Therapist Progress Note  Patient ID: Jonathan [name], MRN: [id number],    Date:09/28/2023  Time Spent: 60 minutes  Time in:  8:00  Time out: 9:00  Treatment Type: Individual Therapy  Reported Symptoms: sadness, inability to pay attention  Mental Status Exam: Appearance:  Casual     Behavior: Appropriate  Motor: Normal  Speech/Language:  Normal Rate  Affect: Appropriate  Mood: pleasant  Thought process: normal  Thought content:   WNL  Sensory/Perceptual disturbances:   WNL  Orientation: oriented to person, place, time/date, and situation  Attention: Good  Concentration: Good  Memory: WNL  Fund of knowledge:  Good  Insight:   Good  Judgment:  Good  Impulse Control: Good   Risk Assessment: Danger to Self:  No Self-injurious Behavior: No Danger to Others: No Duty to Warn:no Physical Aggression / Violence:No  Access to Firearms a concern: No  Gang Involvement:No   Subjective: The patient attended a face-to-face individual therapy session in the office today.   The patient presents as pleasant and cooperative.  The patient reports that things have been a little better since I saw he and his wife last time.  He states that his wife resigned from her job and she is up in [place] with her father at the present moment.  He reported that he feels like he is doing so much better because he met with his medication provider and they adjusted his medications some.  He states that he is now taking a Wellbutrin in the morning and in the afternoon.  He talked about it helping him being much more focused and concentrate better.  He reported that he wants to continue to work on being more mindful of his mood when he is with his family at home.  We talked about what the next step is for he and his wife and it seems that they may be able to retire a little earlier because of inheritance that they are getting.  Interventions: Cognitive Behavioral Therapy  and Insight-Oriented  Diagnosis:Attention deficit hyperactivity disorder (ADHD), combined type  Recurrent major depressive disorder, in full remission (HCC)  Marital problems  Plan: Client Abilities/Strengths  Intelligent, insightful, motivated  Client Treatment Preferences  Outpatient individual therapy  Client Statement of Needs  "I decided I needed therapy to help me sort through a situation I am dealing with"  Treatment Level  Outpatient Individual therapy  Symptoms  Increased defensiveness with his wife: (Status: improved). Passive -  aggressive behaviors: (Status: improved).  Problems Addressed  adjustment disorder, Vocational Stress, Vocational Stress, Vocational Stress, Vocational Stress,  Vocational Stress  Goals 1. Decrease incidents of exhibiting passive aggressive behavior Objective Learn and implement problem-solving skills. Target Date: 2024-09-15 Frequency: Monthly Progress: 90 Modality: individual Related Interventions 1. Conduct Problem-Solving Therapy (see Problem-Solving Therapy by Domenick Bookbinder and Rob Hickman) using techniques such as psychoeducation, modeling, and role-playing to teach the client problem solving skills (i.e., defining a problem specifically, generating possible solutions, evaluating the  pros and cons of each solution, selecting and implementing a plan of action, evaluating the  efficacy of the plan, accepting or revising the plan); role-play application of the problemsolving skill to a real life issue (or assign "Applying Problem-Solving to Interpersonal Conflict" in the Adult Psychotherapy Homework Planner by Stephannie Li). 2. Improve satisfaction and comfort surrounding coworker relationships. 3. Increase job satisfaction and performance due to implementation of  assertiveness and stress management strategies. 4. Increase sense of confidence and competence in dealing with work  responsibilities.  Objective Verbalize healthy, realistic cognitive  messages that promote harmony with others, self-acceptance, and  self-confidence. Target Date: 2024-09-15 Frequency: Monthly Progress: 90 Modality: individual 5. Increase sense of self-esteem and elevation of mood in spite of  unemployment. 6. Pursue employment consistency with a reasonably hopeful and positive attitude. Diagnosis Adult ADD 296.31 (Major depressive affective disorder, recurrent episode in full remission) - 309.28 (Adjustment disorder with mixed anxiety and depressed mood) - Conditions For Discharge Achievement of treatment goals and objectives  The patient approved this plan and is making progress.  Jessiah Wojnar G Harlow Basley, LCSW

## 2023-10-07 ENCOUNTER — Other Ambulatory Visit: Payer: Self-pay | Admitting: Psychiatry

## 2023-10-07 DIAGNOSIS — F3342 Major depressive disorder, recurrent, in full remission: Secondary | ICD-10-CM

## 2023-10-07 DIAGNOSIS — F902 Attention-deficit hyperactivity disorder, combined type: Secondary | ICD-10-CM

## 2023-10-13 ENCOUNTER — Telehealth: Payer: BC Managed Care – PPO | Admitting: Psychiatry

## 2023-10-16 ENCOUNTER — Encounter: Payer: Self-pay | Admitting: Psychiatry

## 2023-10-16 ENCOUNTER — Telehealth (INDEPENDENT_AMBULATORY_CARE_PROVIDER_SITE_OTHER): Payer: BC Managed Care – PPO | Admitting: Psychiatry

## 2023-10-16 DIAGNOSIS — F3342 Major depressive disorder, recurrent, in full remission: Secondary | ICD-10-CM

## 2023-10-16 DIAGNOSIS — F902 Attention-deficit hyperactivity disorder, combined type: Secondary | ICD-10-CM

## 2023-10-16 DIAGNOSIS — F410 Panic disorder [episodic paroxysmal anxiety] without agoraphobia: Secondary | ICD-10-CM

## 2023-10-16 MED ORDER — ALPRAZOLAM 0.5 MG PO TABS: ORAL_TABLET | ORAL | 0 refills | Status: DC

## 2023-10-16 MED ORDER — VILAZODONE HCL 20 MG PO TABS: ORAL_TABLET | ORAL | 1 refills | Status: DC

## 2023-10-16 MED ORDER — BUPROPION HCL ER (SR) 100 MG PO TB12: 100.0000 mg | ORAL_TABLET | Freq: Every day | ORAL | 1 refills | Status: DC

## 2023-10-16 NOTE — Progress Notes (Signed)
Jonathan [name] [id number] 05-08-1961 62 y.o.  Virtual Visit via Video Note  I connected with pt @ on 10/16/23 at  2:30 PM EST by a video enabled telemedicine application and verified that I am speaking with the correct person using two identifiers.   I discussed the limitations of evaluation and management by telemedicine and the availability of in person appointments. The patient expressed understanding and agreed to proceed.  I discussed the assessment and treatment plan with the patient. The patient was provided an opportunity to ask questions and all were answered. The patient agreed with the plan and demonstrated an understanding of the instructions.   The patient was advised to call back or seek an in-person evaluation if the symptoms worsen or if the condition fails to improve as anticipated.  I provided 20 minutes of non-face-to-face time during this encounter.  The patient was located at home.  The provider was located at home.   Corie Chiquito, PMHNP   Subjective:   Patient ID:  Jonathan [name] is a 62 y.o. (DOB [birth year]) male.  Chief Complaint:  Chief Complaint  Patient presents with   Follow-up    Anxiety, depression, and ADHD    HPI Jonathan [name] presents for follow-up of ADHD, anxiety, and depression.   He reports addition of Wellbutrin SR in the afternoon has been helpful. He reports that he notices a "lift in energy and it calms my thoughts." He has been setting an alarm to take Wellbutrin SR at 3 pm. He reports that his wife and daughter have noticed some benefits with Wellbutrin SR. He reports that his motivation has improved and "I grumble less often." Denies irritability- "if anything, the other end of the spectrum." Reports that he has been more patient with aging dog. Denies depressed mood. He reports adequate concentration. He notices less procrastination.   He reports that he has been calm, even in stressful situations. He reports that he feels that  he is able to problem-solve. Denies sleep disturbance. He reports that he may notice some slight decrease in appetite. He reports that he has been able to enjoy things, such as listening to a play list on a long drive. Denies SI.   Wife is in [place] with her father who is in hospice.   Past Psychiatric Medication Trials: Citalopram- Sexual side effects, fatigue. Partially effective for depression. Increased to 15 mg in the last month and noticed more fatigue. He reports that he has been taking 10 mg most of the time since then.  Trintellix- Improved mood, anxiety, and focus on 5 mg. Excessive somnolence on 10 mg. Pristiq Wellbutrin- Took 25 years ago. Cannot recall response. Concerta- Effective. Raised BP. Caused jitteriness.  Modafinil-Helpful for concentration. Caused some edginess   Review of Systems:  Review of Systems  Cardiovascular:        Denies any subjective increase in BP  Endocrine:       Adequate glucose levels  Musculoskeletal:  Negative for gait problem.  Allergic/Immunologic: Negative for environmental allergies.  Neurological:  Negative for tremors and headaches.  Psychiatric/Behavioral:         Please refer to HPI    Medications: I have reviewed the patient's current medications.  Current Outpatient Medications  Medication Sig Dispense Refill   ALPRAZolam (XANAX) 0.5 MG tablet Take 1/2-1 tab as needed every 4-6 hours as needed for severe anxiety and panic 30 tablet 0   B Complex Vitamins (VITAMIN B COMPLEX PO) Take by mouth.  buPROPion (WELLBUTRIN XL) 150 MG 24 hr tablet Take 1 tablet (150 mg total) by mouth daily. 90 tablet 1   buPROPion ER (WELLBUTRIN SR) 100 MG 12 hr tablet Take 1 tablet (100 mg total) by mouth daily in the afternoon. 90 tablet 1   cyclobenzaprine (FLEXERIL) 10 MG tablet Take 1 tablet (10 mg total) by mouth 2 (two) times daily as needed for muscle spasms. 10 tablet 0   fluticasone (FLONASE) 50 MCG/ACT nasal spray Place 1 spray into both nostrils  daily. Begin by using 2 sprays in each nare daily for 3 to 5 days, then decrease to 1 spray in each nare daily. 32 mL 1   losartan-hydrochlorothiazide (HYZAAR) 50-12.5 MG tablet TAKE 2 TABLETS DAILY 180 tablet 3   metFORMIN (GLUCOPHAGE-XR) 500 MG 24 hr tablet Take 3 tablets per day 270 tablet 1   Omega-3 Fatty Acids (FISH OIL PO) Take by mouth.     Probiotic Product (PROBIOTIC PO) Take 1 tablet by mouth every other day.     rosuvastatin (CRESTOR) 5 MG tablet TAKE 5 MG BY MOUTH 5 DAYS PER WEEK FOR 2 WEEKS AND THEN INCREASE TO 5 MG 7 DAYS PER WEEK AS TOLERATED 60 tablet 5   Vilazodone HCl 20 MG TABS Take 1 tablet (20 mg total) by mouth daily with breakfast AND 0.5 tablets (10 mg total) daily with supper. 135 tablet 1   No current facility-administered medications for this visit.    Medication Side Effects: None  Allergies:  Allergies  Allergen Reactions   Statins Other (See Comments)    myalgias     Zocor [Simvastatin]      myalgias   Penicillins Hives    Per pt - happened in childhood - hives possible reaction    Past Medical History:  Diagnosis Date   Allergic rhinitis    Allergy    Anxiety    Depression    Diabetes mellitus without complication (HCC)    type 2   Heart murmur    per pt told it was functional   History of kidney stones    History of nephrolithiasis    Hyperlipidemia    Hypertension    Sebaceous cyst     Family History  Problem Relation Age of Onset   Diabetes Father    Hypertension Father    Kidney cancer Father        now on dialysis   Kidney Stones Father    Kidney Stones Brother    Hunting[place]'s disease Maternal Grandfather    Depression Daughter    Anxiety disorder Daughter    ADD / ADHD Daughter    Colon polyps Daughter 55   Colon cancer Neg Hx    Esophageal cancer Neg Hx    Stomach cancer Neg Hx    Rectal cancer Neg Hx     Social History   Socioeconomic History   Marital status: Married    Spouse name: Not on file   Number of  children: 1   Years of education: 18   Highest education level: Not on file  Occupational History   Occupation: MINISTER    Employer: [employer]  Tobacco Use   Smoking status: Never   Smokeless tobacco: Never  Vaping Use   Vaping status: Never Used  Substance and Sexual Activity   Alcohol use: Yes    Comment: less than 1 a week   Drug use: No   Sexual activity: Yes  Other Topics Concern   Not  on file  Social History Narrative   Married   Regular exercise- no   HH of 3   Dog car and lizard   Sleep wakening at times   New job [employer]   Per week reg sleep.    Social Determinants of Health   Financial Resource Strain: Not on file  Food Insecurity: No Food Insecurity (08/12/2021)   Received from [facility], [facility]   Hunger Vital Sign    Worried About Running Out of Food in the Last Year: Never true    Ran Out of Food in the Last Year: Never true  Transportation Needs: Not on file  Physical Activity: Not on file  Stress: Not on file  Social Connections: Unknown (04/19/2022)   Received from [facility], [facility]   Social Network    Social Network: Not on file  Intimate Partner Violence: Unknown (03/11/2022)   Received from [facility]   HITS    Physically Hurt: Not on file    Insult or Talk Down To: Not on file    Threaten Physical Harm: Not on file    Scream or Curse: Not on file    Past Medical History, Surgical history, Social history, and Family history were reviewed and updated as appropriate.   Please see review of systems for further details on the patient's review from today.   Objective:   Physical Exam:  There were no vitals taken for this visit.  Physical Exam Constitutional:      General: He is not in acute distress. Musculoskeletal:        General: No deformity.  Neurological:     Mental Status: He is alert and oriented to person, place, and time.     Coordination: Coordination normal.   Psychiatric:        Attention and Perception: Attention and perception normal. He does not perceive auditory or visual hallucinations.        Mood and Affect: Mood normal. Mood is not anxious or depressed. Affect is not labile, blunt, angry or inappropriate.        Speech: Speech normal.        Behavior: Behavior normal.        Thought Content: Thought content normal. Thought content is not paranoid or delusional. Thought content does not include homicidal or suicidal ideation. Thought content does not include homicidal or suicidal plan.        Cognition and Memory: Cognition and memory normal.        Judgment: Judgment normal.     Comments: Insight intact     Lab Review:     Component Value Date/Time   NA 140 01/23/2023 0851   NA 141 08/17/2021 0000   K 4.2 01/23/2023 0851   CL 103 01/23/2023 0851   CO2 28 01/23/2023 0851   GLUCOSE 117 (H) 01/23/2023 0851   BUN 13 01/23/2023 0851   CREATININE 1.31 01/23/2023 0851   CREATININE 1.15 10/19/2020 1206   CALCIUM 9.7 01/23/2023 0851   PROT 6.6 01/23/2023 0851   ALBUMIN 4.5 01/23/2023 0851   AST 21 01/23/2023 0851   ALT 22 01/23/2023 0851   ALKPHOS 56 01/23/2023 0851   BILITOT 0.8 01/23/2023 0851   GFRNONAA 72.1 08/17/2021 0000   GFRNONAA 68 10/19/2020 1206   GFRAA 83.5 08/17/2021 0000   GFRAA 79 10/19/2020 1206       Component Value Date/Time   WBC 10.3 01/23/2023 0851   RBC 5.36 01/23/2023 0851  HGB 16.7 01/23/2023 0851   HCT 48.6 01/23/2023 0851   PLT 246.0 01/23/2023 0851   MCV 90.7 01/23/2023 0851   MCV 94.2 04/29/2013 0936   MCH 30.9 10/19/2020 1206   MCHC 34.4 01/23/2023 0851   RDW 13.0 01/23/2023 0851   LYMPHSABS 2.7 01/23/2023 0851   MONOABS 0.8 01/23/2023 0851   EOSABS 0.6 01/23/2023 0851   BASOSABS 0.1 01/23/2023 0851    No results found for: "POCLITH", "LITHIUM"   No results found for: "PHENYTOIN", "PHENOBARB", "VALPROATE", "CBMZ"   .res Assessment: Plan:    25 minutes spent dedicated to the care  of this patient on the date of this encounter to include pre-visit review of records, ordering of medication, post visit documentation, and face-to-face time with the patient discussing response to addition of Wellbutrin SR in the afternoon. Pt reports that he is tolerating addition of medication without adverse effects and that he feels Wellbutrin SR has been helpful for his mood, energy, motivation, and concentration. Will continue current medications without changes at this time.  Continue Wellbutrin SR 100 mg daily in the afternoon for mood and off-label indication of ADHD.  Continue Wellbutrin XL 150 mg daily in the morning for depression.  Continue Viibryd 20 mg in the morning and 10 mg daily in the afternoon for anxiety and depression.  Continue Alprazolam 0.5 mg 1/2-1 tab as needed for anxiety and panic.  Pt to follow-up with this provider in 6 weeks or sooner if clinically indicated.  Recommend continuing therapy with Bambi Cottle, LCSW.  Patient advised to contact office with any questions, adverse effects, or acute worsening in signs and symptoms.   Kekai was seen today for follow-up.  Diagnoses and all orders for this visit:  Recurrent major depressive disorder, in full remission (HCC) -     buPROPion ER (WELLBUTRIN SR) 100 MG 12 hr tablet; Take 1 tablet (100 mg total) by mouth daily in the afternoon. -     Vilazodone HCl 20 MG TABS; Take 1 tablet (20 mg total) by mouth daily with breakfast AND 0.5 tablets (10 mg total) daily with supper.  Attention deficit hyperactivity disorder (ADHD), combined type -     buPROPion ER (WELLBUTRIN SR) 100 MG 12 hr tablet; Take 1 tablet (100 mg total) by mouth daily in the afternoon.  Panic -     ALPRAZolam (XANAX) 0.5 MG tablet; Take 1/2-1 tab as needed every 4-6 hours as needed for severe anxiety and panic -     Vilazodone HCl 20 MG TABS; Take 1 tablet (20 mg total) by mouth daily with breakfast AND 0.5 tablets (10 mg total) daily with  supper.     Please see After Visit Summary for patient specific instructions.  Future Appointments  Date Time Provider Department Center  10/26/2023  9:00 AM Cottle, Bambi G, LCSW [facility] None  11/23/2023  8:00 AM Cottle, Bambi G, LCSW [facility] None    No orders of the defined types were placed in this encounter.     -------------------------------

## 2023-10-18 ENCOUNTER — Encounter: Payer: Self-pay | Admitting: Psychiatry

## 2023-10-26 ENCOUNTER — Ambulatory Visit: Payer: BC Managed Care – PPO | Admitting: Psychology

## 2023-10-26 DIAGNOSIS — F3342 Major depressive disorder, recurrent, in full remission: Secondary | ICD-10-CM | POA: Diagnosis not present

## 2023-10-26 DIAGNOSIS — F4321 Adjustment disorder with depressed mood: Secondary | ICD-10-CM | POA: Diagnosis not present

## 2023-10-26 DIAGNOSIS — F902 Attention-deficit hyperactivity disorder, combined type: Secondary | ICD-10-CM | POA: Diagnosis not present

## 2023-10-26 NOTE — Progress Notes (Signed)
[facility] Counselor/Therapist Progress Note  Patient ID: Jonathan [name], MRN: [id number],    Date:10/26/2023  Time Spent: 54 minutes  Time in:  9:03  Time out: 9:57  Treatment Type: Individual Therapy  Reported Symptoms: sadness, inability to pay attention  Mental Status Exam: Appearance:  Casual     Behavior: Appropriate  Motor: Normal  Speech/Language:  Normal Rate  Affect: Appropriate  Mood: pleasant  Thought process: normal  Thought content:   WNL  Sensory/Perceptual disturbances:   WNL  Orientation: oriented to person, place, time/date, and situation  Attention: Good  Concentration: Good  Memory: WNL  Fund of knowledge:  Good  Insight:   Good  Judgment:  Good  Impulse Control: Good   Risk Assessment: Danger to Self:  No Self-injurious Behavior: No Danger to Others: No Duty to Warn:no Physical Aggression / Violence:No  Access to Firearms a concern: No  Gang Involvement:No   Subjective: The patient attended a face-to-face individual therapy session via video visit.  The patient gave verbal consent for the session to be on caregility.  The patient is aware of the limitations of telehealth.  The patient was in his car alone and the therapist was in the office. The patient presents as a little sad today.  The patient reports that he is in [place] for his father-in-law's funeral.  He reports that his daughter also had to put their dog down on Tuesday by herself and he was stressed about that.  He talked about having issues with his mother and father because they put [name] down without his knowledge back when he was a young boy.  He also talked about issues that he has with his father's death and the way his mother and brother handle things.  We talked about the importance of moving past the issues and understanding those issues are what makes [place] make decisions.  We talked about the knowing that he has gained from these losses that he  has had.  It ended with [place] talking about how wonderful the celebration of life is for his father-in-law.   Interventions: Cognitive Behavioral Therapy and Insight-Oriented  Diagnosis:Recurrent major depressive disorder, in full remission (HCC)  Attention deficit hyperactivity disorder (ADHD), combined type  Grief  Plan: Client Abilities/Strengths  Intelligent, insightful, motivated  Client Treatment Preferences  Outpatient individual therapy  Client Statement of Needs  "I decided I needed therapy to help me sort through a situation I am dealing with"  Treatment Level  Outpatient Individual therapy  Symptoms  Increased defensiveness with his wife: (Status: improved). Passive -  aggressive behaviors: (Status: improved).  Problems Addressed  adjustment disorder, Vocational Stress, Vocational Stress, Vocational Stress, Vocational Stress,  Vocational Stress  Goals 1. Decrease incidents of exhibiting passive aggressive behavior Objective Learn and implement problem-solving skills. Target Date: 2024-09-15 Frequency: Monthly Progress: 90 Modality: individual Related Interventions 1. Conduct Problem-Solving Therapy (see Problem-Solving Therapy by Domenick Bookbinder and Rob Hickman) using techniques such as psychoeducation, modeling, and role-playing to teach the client problem solving skills (i.e., defining a problem specifically, generating possible solutions, evaluating the  pros and cons of each solution, selecting and implementing a plan of action, evaluating the  efficacy of the plan, accepting or revising the plan); role-play application of the problemsolving skill to a real life issue (or assign "Applying Problem-Solving to Interpersonal Conflict" in the Adult Psychotherapy Homework Planner by Stephannie Li). 2. Improve satisfaction and comfort surrounding coworker relationships. 3. Increase job satisfaction and performance due to  implementation of  assertiveness and stress management strategies. 4.  Increase sense of confidence and competence in dealing with work  responsibilities. Objective Verbalize healthy, realistic cognitive messages that promote harmony with others, self-acceptance, and  self-confidence. Target Date: 2024-09-15 Frequency: Monthly Progress: 90 Modality: individual 5. Increase sense of self-esteem and elevation of mood in spite of  unemployment. 6. Pursue employment consistency with a reasonably hopeful and positive attitude. 7.  Deal with lifes issues by gaining wisdom and knowledge and implementing wisdom gained.  Diagnosis Adult ADD 296.31 (Major depressive affective disorder, recurrent episode in full remission) - 309.28 (Adjustment disorder with mixed anxiety and depressed mood) - Conditions For Discharge Achievement of treatment goals and objectives  The patient approved this plan and is making progress.  Naylin Burkle G Aishani Kalis, LCSW

## 2023-11-16 DIAGNOSIS — N200 Calculus of kidney: Secondary | ICD-10-CM | POA: Diagnosis not present

## 2023-11-21 DIAGNOSIS — N200 Calculus of kidney: Secondary | ICD-10-CM | POA: Diagnosis not present

## 2023-11-23 ENCOUNTER — Ambulatory Visit: Payer: BC Managed Care – PPO | Admitting: Psychology

## 2023-11-24 ENCOUNTER — Ambulatory Visit
Admission: RE | Admit: 2023-11-24 | Discharge: 2023-11-24 | Disposition: A | Discharge status: Home / Self Care | Payer: BC Managed Care – PPO | Source: Ambulatory Visit

## 2023-11-24 ENCOUNTER — Other Ambulatory Visit: Payer: Self-pay

## 2023-11-24 VITALS — BP 137/82 | HR 83 | Temp 97.9°F | Resp 18

## 2023-11-24 DIAGNOSIS — J019 Acute sinusitis, unspecified: Secondary | ICD-10-CM | POA: Diagnosis not present

## 2023-11-24 MED ORDER — CEFDINIR 300 MG PO CAPS: 300.0000 mg | ORAL_CAPSULE | Freq: Two times a day (BID) | ORAL | 0 refills | Status: AC

## 2023-11-24 NOTE — ED Triage Notes (Signed)
Pt reports nasal congestion and fatigue x5 days with occasional cough to clear mucus. Denies fevers at home. Little relief with OTC cold meds.

## 2023-11-24 NOTE — ED Provider Notes (Signed)
[facility]    CSN: [id number] Arrival date & time: 11/24/23  1426      History   Chief Complaint Chief Complaint  Patient presents with   Nasal Congestion    HPI KHYLAN NAQVI is a 62 y.o. male.   Patient here today for evaluation of congestion and fatigue that started about 5 days ago.  He reports that he has significant sinus congestion and pressure as well as some postnasal drip with mild sore throat.  He has not had fever.  He has taken over-the-counter medication without resolution.  The history is provided by the patient.    Past Medical History:  Diagnosis Date   Allergic rhinitis    Allergy    Anxiety    Depression    Diabetes mellitus without complication (HCC)    type 2   Heart murmur    per pt told it was functional   History of kidney stones    History of nephrolithiasis    Hyperlipidemia    Hypertension    Sebaceous cyst     Patient Active Problem List   Diagnosis Date Noted   Memory difficulties 02/14/2023   Prediabetes 05/15/2016   Essential hypertension 05/15/2016   Hyperlipidemia 05/15/2016   Dyspepsia and other specified disorders of function of stomach 10/25/2013   Change in bowel habits 09/07/2013   Medication management 09/07/2013   Heartburn symptom 09/07/2013   Hyperglycemia 09/04/2013   BMI 31.0-31.9,adult 05/07/2013   SKIN LESION, UNCERTAIN SIGNIFICANCE 03/31/2010   NEPHROLITHIASIS, HX OF 02/10/2008   HYPERLIPIDEMIA 02/06/2008   Anxiety state 02/06/2008   DEPRESSION 02/06/2008   Attention deficit disorder 02/06/2008   Essential hypertension 02/06/2008   Allergic rhinitis 02/06/2008    Past Surgical History:  Procedure Laterality Date   COLONOSCOPY  07/2010   Arlyce Dice   CYSTOSCOPY/URETEROSCOPY/HOLMIUM LASER/STENT PLACEMENT Left 01/15/2020   Procedure: CYSTOSCOPY/URETEROSCOPY/HOLMIUM LASER/STENT PLACEMENT;  Surgeon: Rene Paci, MD;  Location: [facility];  Service: Urology;   Laterality: Left;   kidney stones removed  cystoscopy   x 2   TONSILLECTOMY     x 2   TYMPANOSTOMY TUBE PLACEMENT  as child       Home Medications    Prior to Admission medications   Medication Sig Start Date End Date Taking? Authorizing Provider  buPROPion (WELLBUTRIN XL) 150 MG 24 hr tablet Take 1 tablet (150 mg total) by mouth daily. 09/15/23  Yes Corie Chiquito, PMHNP  buPROPion ER [facility] SR) 100 MG 12 hr tablet Take 1 tablet (100 mg total) by mouth daily in the afternoon. 10/16/23  Yes Corie Chiquito, PMHNP  cefdinir (OMNICEF) 300 MG capsule Take 1 capsule (300 mg total) by mouth 2 (two) times daily for 7 days. 11/24/23 12/01/23 Yes Tomi Bamberger, PA-C  fluticasone (FLONASE) 50 MCG/ACT nasal spray Place 1 spray into both nostrils daily. Begin by using 2 sprays in each nare daily for 3 to 5 days, then decrease to 1 spray in each nare daily. 03/17/22  Yes Theadora Rama Scales, PA-C  losartan-hydrochlorothiazide (HYZAAR) 50-12.5 MG tablet TAKE 2 TABLETS DAILY 05/31/23  Yes Panosh, Neta Mends, MD  metFORMIN (GLUCOPHAGE-XR) 500 MG 24 hr tablet Take 3 tablets per day 05/09/23  Yes Panosh, Neta Mends, MD  rosuvastatin (CRESTOR) 5 MG tablet TAKE 5 MG BY MOUTH 5 DAYS PER WEEK FOR 2 WEEKS AND THEN INCREASE TO 5 MG 7 DAYS PER WEEK AS TOLERATED 07/25/23  Yes Panosh, Neta Mends, MD  tamsulosin (FLOMAX) 0.4  MG CAPS capsule Take 0.4 mg by mouth daily. 11/16/23  Yes [provider]  Vilazodone HCl (VIIBRYD) 10 MG TABS TAKE 1/2 TABLET DAILY WITH BREAKFAST FOR ONE WEEK, THEN INCREASE TO 1 TABLET DAILY WITH BREAKFAST 03/29/22  Yes [provider]  Vilazodone HCl 20 MG TABS Take 1 tablet (20 mg total) by mouth daily with breakfast AND 0.5 tablets (10 mg total) daily with supper. 10/16/23 01/14/24 Yes Corie Chiquito, PMHNP  albuterol (VENTOLIN HFA) 108 (90 Base) MCG/ACT inhaler TAKE 2 PUFFS BY MOUTH EVERY 6 HOURS AS NEEDED FOR WHEEZE OR SHORTNESS OF BREATH Patient not taking: Reported on  11/24/2023 04/04/22   [provider]  ALPRAZolam Prudy Feeler) 0.5 MG tablet Take 1/2-1 tab as needed every 4-6 hours as needed for severe anxiety and panic 10/16/23   Corie Chiquito, PMHNP  B Complex Vitamins (VITAMIN B COMPLEX PO) Take by mouth.    [provider]  clindamycin (CLEOCIN T) 1 % lotion APPLY SPARINGLY TO AFFECTED AREA TWICE A DAY Patient not taking: Reported on 11/24/2023 09/07/23   [provider]  cyclobenzaprine (FLEXERIL) 10 MG tablet Take 1 tablet (10 mg total) by mouth 2 (two) times daily as needed for muscle spasms. Patient not taking: Reported on 11/24/2023 02/20/23   Radford Pax, NP  ipratropium (ATROVENT) 0.06 % nasal spray PLACE 2 SPRAYS INTO BOTH NOSTRILS 3 (THREE) TIMES DAILY. AS NEEDED FOR NASAL CONGESTION, RUNNY NOSE Patient not taking: Reported on 11/24/2023 04/02/22   [provider]  modafinil (PROVIGIL) 200 MG tablet TAKE 1/2 TO 1 (ONE-HALF TO ONE) TABLET BY MOUTH ONCE DAILY Patient not taking: Reported on 11/24/2023 02/22/22   [provider]  Omega-3 Fatty Acids (FISH OIL PO) Take by mouth.    [provider]  Probiotic Product (PROBIOTIC PO) Take 1 tablet by mouth every other day.    [provider]    Family History Family History  Problem Relation Age of Onset   Diabetes Father    Hypertension Father    Kidney cancer Father        now on dialysis   Kidney Stones Father    Kidney Stones Brother    Huntington's disease Maternal Grandfather    Depression Daughter    Anxiety disorder Daughter    ADD / ADHD Daughter    Colon polyps Daughter 27   Colon cancer Neg Hx    Esophageal cancer Neg Hx    Stomach cancer Neg Hx    Rectal cancer Neg Hx     Social History Social History   Tobacco Use   Smoking status: Never   Smokeless tobacco: Never  Vaping Use   Vaping status: Never Used  Substance Use Topics   Alcohol use: Yes    Comment: less than 1 a week   Drug use: No     Allergies    Statins, Zocor [simvastatin], and Penicillins   Review of Systems Review of Systems  Constitutional:  Negative for chills and fever.  HENT:  Positive for congestion, sinus pressure and sore throat. Negative for ear pain.   Eyes:  Negative for discharge and redness.  Respiratory:  Positive for cough. Negative for shortness of breath.   Gastrointestinal:  Negative for abdominal pain, nausea and vomiting.     Physical Exam Triage Vital Signs ED Triage Vitals  Encounter Vitals Group     BP 11/24/23 1513 137/82     Systolic BP Percentile --      Diastolic BP Percentile --  Pulse Rate 11/24/23 1513 83     Resp 11/24/23 1513 18     Temp 11/24/23 1513 97.9 F (36.6 C)     Temp Source 11/24/23 1513 Oral     SpO2 11/24/23 1513 95 %     Weight --      Height --      Head Circumference --      Peak Flow --      Pain Score 11/24/23 1516 3     Pain Loc --      Pain Education --      Exclude from Growth Chart --    No data found.  Updated Vital Signs BP 137/82 (BP Location: Right Arm)   Pulse 83   Temp 97.9 F (36.6 C) (Oral)   Resp 18   SpO2 95%   Visual Acuity Right Eye Distance:   Left Eye Distance:   Bilateral Distance:    Right Eye Near:   Left Eye Near:    Bilateral Near:     Physical Exam Vitals and nursing note reviewed.  Constitutional:      General: He is not in acute distress.    Appearance: Normal appearance. He is not ill-appearing.  HENT:     Head: Normocephalic and atraumatic.     Nose: Congestion present.     Mouth/Throat:     Mouth: Mucous membranes are moist.     Pharynx: Oropharynx is clear. No oropharyngeal exudate or posterior oropharyngeal erythema.  Eyes:     Conjunctiva/sclera: Conjunctivae normal.  Cardiovascular:     Rate and Rhythm: Normal rate and regular rhythm.     Heart sounds: Normal heart sounds. No murmur heard. Pulmonary:     Effort: Pulmonary effort is normal. No respiratory distress.     Breath sounds: Normal breath  sounds. No wheezing, rhonchi or rales.  Skin:    General: Skin is warm and dry.  Neurological:     Mental Status: He is alert.  Psychiatric:        Mood and Affect: Mood normal.        Thought Content: Thought content normal.      UC Treatments / Results  Labs (all labs ordered are listed, but only abnormal results are displayed) Labs Reviewed - No data to display  EKG   Radiology No results found.  Procedures Procedures (including critical care time)  Medications Ordered in UC Medications - No data to display  Initial Impression / Assessment and Plan / UC Course  I have reviewed the triage vital signs and the nursing notes.  Pertinent labs & imaging results that were available during my care of the patient were reviewed by me and considered in my medical decision making (see chart for details).    Will treat to cover sinusitis with cefdinir as patient has tolerated same in the past.  Recommended further evaluation if no gradual improvement with any further concerns.  Final Clinical Impressions(s) / UC Diagnoses   Final diagnoses:  Acute sinusitis, recurrence not specified, unspecified location   Discharge Instructions   None    ED Prescriptions     Medication Sig Dispense Auth. Provider   cefdinir (OMNICEF) 300 MG capsule Take 1 capsule (300 mg total) by mouth 2 (two) times daily for 7 days. 14 capsule Tomi Bamberger, PA-C      PDMP not reviewed this encounter.   Tomi Bamberger, PA-C 11/24/23 [phone]

## 2023-12-11 ENCOUNTER — Encounter: Payer: Self-pay | Admitting: Gastroenterology

## 2023-12-15 ENCOUNTER — Other Ambulatory Visit: Payer: Self-pay | Admitting: Internal Medicine

## 2023-12-15 NOTE — Telephone Encounter (Signed)
 Copied from CRM [id number]. Topic: Clinical - Medication Refill >> Dec 15, 2023  9:19 AM Franky GRADE wrote: Most Recent Primary Care Visit:  Provider: CHARLETT APOLINAR POUR  Department: [facility]  Visit Type: OFFICE VISIT  Date: 03/07/2023  Medication: losartan -hydrochlorothiazide  (HYZAAR) 50-12.5 MG tablet  Has the patient contacted their pharmacy? Yes, pharmacy informed patient to contact pcp's office.  (Agent: If no, request that the patient contact the pharmacy for the refill. If patient does not wish to contact the pharmacy document the reason why and proceed with request.) (Agent: If yes, when and what did the pharmacy advise?)  Is this the correct pharmacy for this prescription? Yes If no, delete pharmacy and type the correct one.  This is the patient's preferred pharmacy:  [id number] [address], [place] - [address] [place] [address] Phone: [phone] Fax: [phone]     Has the prescription been filled recently? No  Is the patient out of the medication? No  Has the patient been seen for an appointment in the last year OR does the patient have an upcoming appointment? Yes  Can we respond through MyChart? Yes  Agent: Please be advised that Rx refills may take up to 3 business days. We ask that you follow-up with your pharmacy.

## 2023-12-18 MED ORDER — LOSARTAN POTASSIUM-HCTZ 50-12.5 MG PO TABS: 2.0000 | ORAL_TABLET | Freq: Every day | ORAL | 3 refills | Status: DC

## 2024-01-03 ENCOUNTER — Ambulatory Visit: Payer: BC Managed Care – PPO | Admitting: Psychology

## 2024-01-03 DIAGNOSIS — F902 Attention-deficit hyperactivity disorder, combined type: Secondary | ICD-10-CM | POA: Diagnosis not present

## 2024-01-03 DIAGNOSIS — F4321 Adjustment disorder with depressed mood: Secondary | ICD-10-CM | POA: Diagnosis not present

## 2024-01-03 DIAGNOSIS — F3342 Major depressive disorder, recurrent, in full remission: Secondary | ICD-10-CM

## 2024-01-03 NOTE — Progress Notes (Signed)
[facility] Counselor/Therapist Progress Note  Patient ID: Jonathan [name], MRN: [id number],    Date:01/03/2024  Time Spent: 60 minutes  Time in:  8:00  Time out: 9:00  Treatment Type: Individual Therapy  Reported Symptoms: sadness, inability to pay attention  Mental Status Exam: Appearance:  Casual     Behavior: Appropriate  Motor: Normal  Speech/Language:  Normal Rate  Affect: Appropriate  Mood: pleasant  Thought process: normal  Thought content:   WNL  Sensory/Perceptual disturbances:   WNL  Orientation: oriented to person, place, time/date, and situation  Attention: Good  Concentration: Good  Memory: WNL  Fund of knowledge:  Good  Insight:   Good  Judgment:  Good  Impulse Control: Good   Risk Assessment: Danger to Self:  No Self-injurious Behavior: No Danger to Others: No Duty to Warn:no Physical Aggression / Violence:No  Access to Firearms a concern: No  Gang Involvement:No   Subjective: The patient attended a face-to-face individual therapy session in the office today.  The patient presents as pleasant and cooperative.  The patient reports that there is a lot going on since I talked to him last.  The last time I saw him he was in [place] for his father-in-law's funeral.  Since then he reports that his brother has had some health issues and he is in a dilemma and they cannot sell their mother's home yet because he is having this health scare.  This could be an serious situation for her as brother and there is also a lot of factors that could play into care and that sort of thing.  We talked about that briefly.  In addition he reports that his wife is struggling with her depression again and we talked about ways to circle back around the conversations when she is having difficulty managing her emotions around things.  The patient finds this very helpful and he felt that he could be able to do those things.  We will continue to meet at least monthly  and even more if necessary.   Interventions: Cognitive Behavioral Therapy and Insight-Oriented  Diagnosis:Recurrent major depressive disorder, in full remission (HCC)  Attention deficit hyperactivity disorder (ADHD), combined type  Grief  Plan: Client Abilities/Strengths  Intelligent, insightful, motivated  Client Treatment Preferences  Outpatient individual therapy  Client Statement of Needs  "I decided I needed therapy to help me sort through a situation I am dealing with"  Treatment Level  Outpatient Individual therapy  Symptoms  Increased defensiveness with his wife: (Status: improved). Passive -  aggressive behaviors: (Status: improved).  Problems Addressed  adjustment disorder, Vocational Stress, Vocational Stress, Vocational Stress, Vocational Stress,  Vocational Stress  Goals 1. Decrease incidents of exhibiting passive aggressive behavior Objective Learn and implement problem-solving skills. Target Date: 2024-09-15 Frequency: Monthly Progress: 90 Modality: individual Related Interventions 1. Conduct Problem-Solving Therapy (see Problem-Solving Therapy by Domenick Bookbinder and Rob Hickman) using techniques such as psychoeducation, modeling, and role-playing to teach the client problem solving skills (i.e., defining a problem specifically, generating possible solutions, evaluating the  pros and cons of each solution, selecting and implementing a plan of action, evaluating the  efficacy of the plan, accepting or revising the plan); role-play application of the problemsolving skill to a real life issue (or assign "Applying Problem-Solving to Interpersonal Conflict" in the Adult Psychotherapy Homework Planner by Stephannie Li). 2. Improve satisfaction and comfort surrounding coworker relationships. 3. Increase job satisfaction and performance due to implementation of  assertiveness and stress management strategies. 4. Increase  sense of confidence and competence in dealing with work   responsibilities. Objective Verbalize healthy, realistic cognitive messages that promote harmony with others, self-acceptance, and  self-confidence. Target Date: 2024-09-15 Frequency: Monthly Progress: 90 Modality: individual 5. Increase sense of self-esteem and elevation of mood in spite of  unemployment. 6. Pursue employment consistency with a reasonably hopeful and positive attitude. 7.  Deal with lifes issues by gaining wisdom and knowledge and implementing wisdom gained.  Diagnosis Adult ADD 296.31 (Major depressive affective disorder, recurrent episode in full remission) - 309.28 (Adjustment disorder with mixed anxiety and depressed mood) - Conditions For Discharge Achievement of treatment goals and objectives  The patient approved this plan and is making progress.  Bekki Tavenner G Chloey Ricard, LCSW

## 2024-01-05 ENCOUNTER — Ambulatory Visit (AMBULATORY_SURGERY_CENTER): Payer: BC Managed Care – PPO

## 2024-01-05 VITALS — Ht 66.5 in | Wt 200.0 lb

## 2024-01-05 DIAGNOSIS — Z860100 Personal history of colon polyps, unspecified: Secondary | ICD-10-CM

## 2024-01-05 MED ORDER — SUFLAVE 178.7 G PO SOLR: 1.0000 | ORAL | 0 refills | Status: DC

## 2024-01-05 NOTE — Progress Notes (Signed)

## 2024-01-19 DIAGNOSIS — M25561 Pain in right knee: Secondary | ICD-10-CM | POA: Diagnosis not present

## 2024-01-19 DIAGNOSIS — M25562 Pain in left knee: Secondary | ICD-10-CM | POA: Diagnosis not present

## 2024-01-26 ENCOUNTER — Other Ambulatory Visit: Payer: Self-pay | Admitting: Internal Medicine

## 2024-01-26 ENCOUNTER — Encounter: Payer: Self-pay | Admitting: Gastroenterology

## 2024-01-29 ENCOUNTER — Telehealth: Payer: Self-pay | Admitting: Gastroenterology

## 2024-01-29 NOTE — Telephone Encounter (Signed)
 Patient called and realized that he was unaware that he was supposed to hold all NSAIDS, he did take a meloxicam yesterday and I told him that he would be ok to proceed with his procedure tomorrow.I did ask him not to take going forward and he agreed. No further questions.

## 2024-01-29 NOTE — Telephone Encounter (Signed)
 Inbound call from patient stating he took meloxicam medication yesterday for knee pain. Requesting a call to discuss if it is okay to proceed with colonoscopy scheduled for tomorrow 2/25. Please advise, thank you

## 2024-01-30 ENCOUNTER — Ambulatory Visit: Payer: BC Managed Care – PPO | Admitting: Gastroenterology

## 2024-01-30 ENCOUNTER — Encounter: Payer: Self-pay | Admitting: Gastroenterology

## 2024-01-30 VITALS — BP 107/66 | HR 60 | Temp 98.4°F | Resp 15 | Ht 66.5 in | Wt 200.0 lb

## 2024-01-30 DIAGNOSIS — K621 Rectal polyp: Secondary | ICD-10-CM

## 2024-01-30 DIAGNOSIS — K644 Residual hemorrhoidal skin tags: Secondary | ICD-10-CM | POA: Diagnosis not present

## 2024-01-30 DIAGNOSIS — K648 Other hemorrhoids: Secondary | ICD-10-CM | POA: Diagnosis not present

## 2024-01-30 DIAGNOSIS — Z860101 Personal history of adenomatous and serrated colon polyps: Secondary | ICD-10-CM

## 2024-01-30 DIAGNOSIS — D125 Benign neoplasm of sigmoid colon: Secondary | ICD-10-CM | POA: Diagnosis not present

## 2024-01-30 DIAGNOSIS — K5730 Diverticulosis of large intestine without perforation or abscess without bleeding: Secondary | ICD-10-CM

## 2024-01-30 DIAGNOSIS — D128 Benign neoplasm of rectum: Secondary | ICD-10-CM

## 2024-01-30 DIAGNOSIS — D124 Benign neoplasm of descending colon: Secondary | ICD-10-CM | POA: Diagnosis not present

## 2024-01-30 DIAGNOSIS — D122 Benign neoplasm of ascending colon: Secondary | ICD-10-CM | POA: Diagnosis not present

## 2024-01-30 DIAGNOSIS — D127 Benign neoplasm of rectosigmoid junction: Secondary | ICD-10-CM | POA: Diagnosis not present

## 2024-01-30 DIAGNOSIS — Z1211 Encounter for screening for malignant neoplasm of colon: Secondary | ICD-10-CM

## 2024-01-30 DIAGNOSIS — Z860100 Personal history of colon polyps, unspecified: Secondary | ICD-10-CM

## 2024-01-30 DIAGNOSIS — K635 Polyp of colon: Secondary | ICD-10-CM | POA: Diagnosis not present

## 2024-01-30 MED ORDER — SODIUM CHLORIDE 0.9 % IV SOLN: 500.0000 mL | Freq: Once | INTRAVENOUS | Status: DC

## 2024-01-30 NOTE — Progress Notes (Signed)
 Pt's states no medical or surgical changes since previsit or office visit.

## 2024-01-30 NOTE — Progress Notes (Signed)
 Called to room to assist during endoscopic procedure.  Patient ID and intended procedure confirmed with present staff. Received instructions for my participation in the procedure from the performing physician.

## 2024-01-30 NOTE — Progress Notes (Unsigned)
 [facility] History and Physical   Primary Care Physician:  Madelin Headings, MD   Reason for Procedure:  History of adenomatous colon polyps  Plan:    Surveillance colonoscopy with possible interventions as needed     HPI: Jonathan [name] is a very pleasant 63 y.o. male here for surveillance colonoscopy. Denies any nausea, vomiting, abdominal pain, melena or bright red blood per rectum  The risks and benefits as well as alternatives of endoscopic procedure(s) have been discussed and reviewed. All questions answered. The patient agrees to proceed.    Past Medical History:  Diagnosis Date   Allergic rhinitis    Allergy    Anxiety    Depression    Diabetes mellitus without complication (HCC)    type 2   Heart murmur    per pt told it was functional   History of kidney stones    History of nephrolithiasis    Hyperlipidemia    Hypertension    Sebaceous cyst     Past Surgical History:  Procedure Laterality Date   COLONOSCOPY  07/2010   Arlyce Dice   CYSTOSCOPY/URETEROSCOPY/HOLMIUM LASER/STENT PLACEMENT Left 01/15/2020   Procedure: CYSTOSCOPY/URETEROSCOPY/HOLMIUM LASER/STENT PLACEMENT;  Surgeon: Rene Paci, MD;  Location: [facility];  Service: Urology;  Laterality: Left;   kidney stones removed  cystoscopy   x 2   TONSILLECTOMY     x 2   TYMPANOSTOMY TUBE PLACEMENT  as child    Prior to Admission medications   Medication Sig Start Date End Date Taking? Authorizing Provider  buPROPion (WELLBUTRIN XL) 150 MG 24 hr tablet Take 1 tablet (150 mg total) by mouth daily. 09/15/23  Yes Corie Chiquito, PMHNP  buPROPion ER [facility] SR) 100 MG 12 hr tablet Take 1 tablet (100 mg total) by mouth daily in the afternoon. 10/16/23  Yes Corie Chiquito, PMHNP  fluticasone (FLONASE) 50 MCG/ACT nasal spray Place 1 spray into both nostrils daily. Begin by using 2 sprays in each nare daily for 3 to 5 days, then decrease to 1 spray in each nare  daily. 03/17/22  Yes Theadora Rama Scales, PA-C  losartan-hydrochlorothiazide (HYZAAR) 50-12.5 MG tablet Take 2 tablets by mouth daily. 12/18/23  Yes Worthy Rancher B, FNP  metFORMIN (GLUCOPHAGE-XR) 500 MG 24 hr tablet TAKE 3 TABLETS DAILY 01/26/24  Yes Panosh, Neta Mends, MD  rosuvastatin (CRESTOR) 5 MG tablet TAKE 5 MG BY MOUTH 5 DAYS PER WEEK FOR 2 WEEKS AND THEN INCREASE TO 5 MG 7 DAYS PER WEEK AS TOLERATED 07/25/23  Yes Panosh, Neta Mends, MD  tamsulosin (FLOMAX) 0.4 MG CAPS capsule Take 0.4 mg by mouth daily. 11/16/23  Yes [provider]  Vilazodone HCl (VIIBRYD) 10 MG TABS TAKE 1/2 TABLET DAILY WITH BREAKFAST FOR ONE WEEK, THEN INCREASE TO 1 TABLET DAILY WITH BREAKFAST 03/29/22  Yes [provider]  ALPRAZolam Prudy Feeler) 0.5 MG tablet Take 1/2-1 tab as needed every 4-6 hours as needed for severe anxiety and panic 10/16/23   Corie Chiquito, PMHNP  B Complex Vitamins (VITAMIN B COMPLEX PO) Take by mouth. Patient not taking: Reported on 01/05/2024    [provider]  Omega-3 Fatty Acids (FISH OIL PO) Take by mouth. Patient not taking: Reported on 01/30/2024    [provider]  Probiotic Product (PROBIOTIC PO) Take 1 tablet by mouth every other day.    [provider]  Vilazodone HCl 20 MG TABS Take 1 tablet (20 mg total) by mouth daily with breakfast AND 0.5 tablets (10  mg total) daily with supper. 10/16/23 01/14/24  Corie Chiquito, PMHNP    Current Outpatient Medications  Medication Sig Dispense Refill   buPROPion (WELLBUTRIN XL) 150 MG 24 hr tablet Take 1 tablet (150 mg total) by mouth daily. 90 tablet 1   buPROPion ER (WELLBUTRIN SR) 100 MG 12 hr tablet Take 1 tablet (100 mg total) by mouth daily in the afternoon. 90 tablet 1   fluticasone (FLONASE) 50 MCG/ACT nasal spray Place 1 spray into both nostrils daily. Begin by using 2 sprays in each nare daily for 3 to 5 days, then decrease to 1 spray in each nare daily. 32 mL 1   losartan-hydrochlorothiazide  (HYZAAR) 50-12.5 MG tablet Take 2 tablets by mouth daily. 180 tablet 3   metFORMIN (GLUCOPHAGE-XR) 500 MG 24 hr tablet TAKE 3 TABLETS DAILY 270 tablet 0   rosuvastatin (CRESTOR) 5 MG tablet TAKE 5 MG BY MOUTH 5 DAYS PER WEEK FOR 2 WEEKS AND THEN INCREASE TO 5 MG 7 DAYS PER WEEK AS TOLERATED 60 tablet 5   tamsulosin (FLOMAX) 0.4 MG CAPS capsule Take 0.4 mg by mouth daily.     Vilazodone HCl (VIIBRYD) 10 MG TABS TAKE 1/2 TABLET DAILY WITH BREAKFAST FOR ONE WEEK, THEN INCREASE TO 1 TABLET DAILY WITH BREAKFAST     ALPRAZolam (XANAX) 0.5 MG tablet Take 1/2-1 tab as needed every 4-6 hours as needed for severe anxiety and panic 30 tablet 0   B Complex Vitamins (VITAMIN B COMPLEX PO) Take by mouth. (Patient not taking: Reported on 01/05/2024)     Omega-3 Fatty Acids (FISH OIL PO) Take by mouth. (Patient not taking: Reported on 01/30/2024)     Probiotic Product (PROBIOTIC PO) Take 1 tablet by mouth every other day.     Vilazodone HCl 20 MG TABS Take 1 tablet (20 mg total) by mouth daily with breakfast AND 0.5 tablets (10 mg total) daily with supper. 135 tablet 1   Current Facility-Administered Medications  Medication Dose Route Frequency Provider Last Rate Last Admin   0.9 %  sodium chloride infusion  500 mL Intravenous Once Napoleon Form, MD        Allergies as of 01/30/2024 - Review Complete 01/30/2024  Allergen Reaction Noted   Statins Other (See Comments) 05/17/2011   Zocor [simvastatin] Other (See Comments) 05/17/2011   Penicillins Hives     Family History  Problem Relation Age of Onset   Diabetes Father    Hypertension Father    Kidney cancer Father        now on dialysis   Kidney Stones Father    Kidney Stones Brother    Huntington's disease Maternal Grandfather    Depression Daughter    Anxiety disorder Daughter    ADD / ADHD Daughter    Colon polyps Daughter 46   Colon cancer Neg Hx    Esophageal cancer Neg Hx    Stomach cancer Neg Hx    Rectal cancer Neg Hx     Social  History   Socioeconomic History   Marital status: Married    Spouse name: Not on file   Number of children: 1   Years of education: 18   Highest education level: Not on file  Occupational History   Occupation: MINISTER    Employer: [employer]  Tobacco Use   Smoking status: Never   Smokeless tobacco: Never  Vaping Use   Vaping status: Never Used  Substance and Sexual Activity   Alcohol use: Yes  Comment: less than 1 a week   Drug use: No   Sexual activity: Yes  Other Topics Concern   Not on file  Social History Narrative   Married   Regular exercise- no   HH of 3   Dog car and lizard   Sleep wakening at times   New job [employer]   Per week reg sleep.    Social Drivers of Corporate investment banker Strain: Not on file  Food Insecurity: No Food Insecurity (08/12/2021)   Received from [facility], [facility]   Hunger Vital Sign    Worried About Running Out of Food in the Last Year: Never true    Ran Out of Food in the Last Year: Never true  Transportation Needs: Not on file  Physical Activity: Not on file  Stress: Not on file  Social Connections: Unknown (04/19/2022)   Received from [facility], [facility]   Social Network    Social Network: Not on file  Intimate Partner Violence: Unknown (03/11/2022)   Received from [facility], [facility]   HITS    Physically Hurt: Not on file    Insult or Talk Down To: Not on file    Threaten Physical Harm: Not on file    Scream or Curse: Not on file    Review of Systems:  All other review of systems negative except as mentioned in the HPI.  Physical Exam: Vital signs in last 24 hours: BP (!) 142/84   Pulse 67   Temp 98.4 F (36.9 C)   Ht 5' 6.5" (1.689 m)   Wt 200 lb (90.7 kg)   SpO2 93%   BMI 31.80 kg/m  General:   Alert, NAD Lungs:  Clear .   Heart:  Regular rate and rhythm Abdomen:  Soft, nontender and nondistended. Neuro/Psych:  Alert and cooperative. Normal mood and affect. A  and O x 3  Reviewed labs, radiology imaging, old records and pertinent past GI work up  Patient is appropriate for planned procedure(s) and anesthesia in an ambulatory setting   K. Scherry Ran , MD [phone]

## 2024-01-30 NOTE — Patient Instructions (Signed)

## 2024-01-30 NOTE — Progress Notes (Unsigned)
 Sedate, gd SR, tolerated procedure well, VSS, report to RN

## 2024-01-30 NOTE — Op Note (Signed)
 [facility] Patient Name: Jonathan [name] Procedure Date: 01/30/2024 9:03 AM MRN: [id number] Endoscopist: Napoleon Form , MD, [id number] Age: 63 Referring MD:  Date of Birth: [birth year] Gender: Male Account #: [id number] Procedure:                Colonoscopy Indications:              High risk colon cancer surveillance: Personal                            history of colonic polyps, High risk colon cancer                            surveillance: Personal history of adenoma (10 mm or                            greater in size) Medicines:                Monitored Anesthesia Care Procedure:                Pre-Anesthesia Assessment:                           - Prior to the procedure, a History and Physical                            was performed, and patient medications and                            allergies were reviewed. The patient's tolerance of                            previous anesthesia was also reviewed. The risks                            and benefits of the procedure and the sedation                            options and risks were discussed with the patient.                            All questions were answered, and informed consent                            was obtained. Prior Anticoagulants: The patient has                            taken no anticoagulant or antiplatelet agents. ASA                            Grade Assessment: II - A patient with mild systemic                            disease. After reviewing the risks and benefits,  the patient was deemed in satisfactory condition to                            undergo the procedure.                           After obtaining informed consent, the colonoscope                            was passed under direct vision. Throughout the                            procedure, the patient's blood pressure, pulse, and                            oxygen saturations were monitored  continuously. The                            PCF-HQ190L Colonoscope [id number] was introduced                            through the anus and advanced to the the cecum,                            identified by appendiceal orifice and ileocecal                            valve. The colonoscopy was performed without                            difficulty. The patient tolerated the procedure                            well. The quality of the bowel preparation was                            good. The terminal ileum, ileocecal valve,                            appendiceal orifice, and rectum were photographed. Scope In: 9:10:52 AM Scope Out: 9:28:25 AM Scope Withdrawal Time: 0 hours 13 minutes 28 seconds  Total Procedure Duration: 0 hours 17 minutes 33 seconds  Findings:                 The perianal and digital rectal examinations were                            normal.                           Six sessile polyps were found in the rectum,                            sigmoid colon, descending colon and ascending  colon. The polyps were 3 to 6 mm in size. These                            polyps were removed with a cold snare. Resection                            and retrieval were complete.                           Scattered small-mouthed diverticula were found in                            the sigmoid colon and descending colon.                           Non-bleeding external and internal hemorrhoids were                            found during retroflexion. The hemorrhoids were                            medium-sized. Complications:            No immediate complications. Estimated Blood Loss:     Estimated blood loss was minimal. Impression:               - Six 3 to 6 mm polyps in the rectum, in the                            sigmoid colon, in the descending colon and in the                            ascending colon, removed with a cold snare.                             Resected and retrieved.                           - Diverticulosis in the sigmoid colon and in the                            descending colon.                           - Non-bleeding external and internal hemorrhoids. Recommendation:           - Patient has a contact number available for                            emergencies. The signs and symptoms of potential                            delayed complications were discussed with the                            patient.  Return to normal activities tomorrow.                            Written discharge instructions were provided to the                            patient.                           - Resume previous diet.                           - Continue present medications.                           - Await pathology results.                           - Repeat colonoscopy in 3 - 5 years for                            surveillance based on pathology results. Napoleon Form, MD 01/30/2024 9:36:22 AM This report has been signed electronically.

## 2024-01-31 ENCOUNTER — Telehealth: Payer: Self-pay | Admitting: *Deleted

## 2024-01-31 NOTE — Telephone Encounter (Signed)
  Follow up Call-     01/30/2024    8:41 AM  Call back number  Post procedure Call Back phone  # [phone]  Permission to leave phone message Yes     Patient questions:  Do you have a fever, pain , or abdominal swelling? No. Pain Score  0 *  Have you tolerated food without any problems? Yes.    Have you been able to return to your normal activities? Yes.    Do you have any questions about your discharge instructions: Diet   No. Medications  No. Follow up visit  No.  Do you have questions or concerns about your Care? No.  Actions: * If pain score is 4 or above: No action needed, pain <4.

## 2024-02-01 ENCOUNTER — Ambulatory Visit: Payer: BC Managed Care – PPO | Admitting: Psychology

## 2024-02-01 LAB — SURGICAL PATHOLOGY

## 2024-02-05 DIAGNOSIS — M25561 Pain in right knee: Secondary | ICD-10-CM | POA: Diagnosis not present

## 2024-02-05 DIAGNOSIS — M25562 Pain in left knee: Secondary | ICD-10-CM | POA: Diagnosis not present

## 2024-02-22 ENCOUNTER — Ambulatory Visit: Payer: BC Managed Care – PPO | Admitting: Psychology

## 2024-02-22 DIAGNOSIS — F3342 Major depressive disorder, recurrent, in full remission: Secondary | ICD-10-CM

## 2024-02-22 DIAGNOSIS — F902 Attention-deficit hyperactivity disorder, combined type: Secondary | ICD-10-CM

## 2024-02-22 NOTE — Progress Notes (Signed)
 [facility] Counselor/Therapist Progress Note  Patient ID: Jonathan [name], MRN: [id number],    Date:02/22/2024  Time Spent: 60 minutes  Time in:  8:01  Time out: 9:01  Treatment Type: Individual Therapy  Reported Symptoms: sadness, inability to pay attention  Mental Status Exam: Appearance:  Casual     Behavior: Appropriate  Motor: Normal  Speech/Language:  Normal Rate  Affect: Appropriate  Mood: pleasant  Thought process: normal  Thought content:   WNL  Sensory/Perceptual disturbances:   WNL  Orientation: oriented to person, place, time/date, and situation  Attention: Good  Concentration: Good  Memory: WNL  Fund of knowledge:  Good  Insight:   Good  Judgment:  Good  Impulse Control: Good   Risk Assessment: Danger to Self:  No Self-injurious Behavior: No Danger to Others: No Duty to Warn:no Physical Aggression / Violence:No  Access to Firearms a concern: No  Gang Involvement:No   Subjective: The patient attended a face-to-face individual therapy session in the office today.  The patient presents as pleasant and cooperative.  The patient reports that he and [name] are doing okay.  He states that she is in [place] this week and that he feels that he needs to work on not being "[name]".  We talked about it being somewhat an issue of him being socialized to be the protector and the fixer when he was growing up.  We talked about the need to make it a thing that he can notice when he is doing it so that he can stop himself.  We talked about the possibility of doing EMDR around this and he reported that he felt like he needed to come in more often.  I explained to him that I would not be available for some time until May but that we could schedule some more sessions so that we could do the EMDR to help him get more aware of this issue.  The other thing that is happening probably in the next year as he is considering retirement and he has not made specific plans  about that.  We will schedule more appointments closer together after April.  Interventions: Cognitive Behavioral Therapy and Insight-Oriented  Diagnosis:Recurrent major depressive disorder, in full remission (HCC)  Attention deficit hyperactivity disorder (ADHD), combined type  Plan: Client Abilities/Strengths  Intelligent, insightful, motivated  Client Treatment Preferences  Outpatient individual therapy  Client Statement of Needs  "I decided I needed therapy to help me sort through a situation I am dealing with"  Treatment Level  Outpatient Individual therapy  Symptoms  Increased defensiveness with his wife: (Status: improved). Passive -  aggressive behaviors: (Status: improved).  Problems Addressed  adjustment disorder, Vocational Stress, Vocational Stress, Vocational Stress, Vocational Stress,  Vocational Stress  Goals 1. Decrease incidents of exhibiting passive aggressive behavior Objective Learn and implement problem-solving skills. Target Date: 2024-09-15 Frequency: Monthly Progress: 90 Modality: individual Related Interventions 1. Conduct Problem-Solving Therapy (see Problem-Solving Therapy by Domenick Bookbinder and Rob Hickman) using techniques such as psychoeducation, modeling, and role-playing to teach the client problem solving skills (i.e., defining a problem specifically, generating possible solutions, evaluating the  pros and cons of each solution, selecting and implementing a plan of action, evaluating the  efficacy of the plan, accepting or revising the plan); role-play application of the problemsolving skill to a real life issue (or assign "Applying Problem-Solving to Interpersonal Conflict" in the Adult Psychotherapy Homework Planner by Stephannie Li). 2. Improve satisfaction and comfort surrounding coworker relationships. 3. Increase job satisfaction  and performance due to implementation of  assertiveness and stress management strategies. 4. Increase sense of confidence and  competence in dealing with work  responsibilities. Objective Verbalize healthy, realistic cognitive messages that promote harmony with others, self-acceptance, and  self-confidence. Target Date: 2024-09-15 Frequency: Monthly Progress: 90 Modality: individual 5. Increase sense of self-esteem and elevation of mood in spite of  unemployment. 6. Pursue employment consistency with a reasonably hopeful and positive attitude. 7.  Deal with lifes issues by gaining wisdom and knowledge and implementing wisdom gained.  Diagnosis Adult ADD 296.31 (Major depressive affective disorder, recurrent episode in full remission) - 309.28 (Adjustment disorder with mixed anxiety and depressed mood) - Conditions For Discharge Achievement of treatment goals and objectives  The patient approved this plan and is making progress.  Nadea Kirkland G Azora Bonzo, LCSW

## 2024-02-26 DIAGNOSIS — M25561 Pain in right knee: Secondary | ICD-10-CM | POA: Diagnosis not present

## 2024-02-26 DIAGNOSIS — M25562 Pain in left knee: Secondary | ICD-10-CM | POA: Diagnosis not present

## 2024-03-13 ENCOUNTER — Ambulatory Visit
Admission: EM | Admit: 2024-03-13 | Discharge: 2024-03-13 | Disposition: A | Discharge status: Home / Self Care | Attending: Family Medicine | Admitting: Family Medicine

## 2024-03-13 DIAGNOSIS — H1012 Acute atopic conjunctivitis, left eye: Secondary | ICD-10-CM | POA: Diagnosis not present

## 2024-03-13 MED ORDER — AZELASTINE HCL 0.05 % OP SOLN: 1.0000 [drp] | Freq: Two times a day (BID) | OPHTHALMIC | 0 refills | Status: AC | PRN

## 2024-03-13 NOTE — Discharge Instructions (Addendum)
 Start allergy eyedrops twice daily as needed.  Also start an over-the-counter allergy medicine such as Claritin or Zyrtec daily for the next 7 to 14 days.  You may do warm compresses to your eye as needed.  Please follow-up with your PCP if your symptoms do not improve.  Please go to the ER if you develop any worsening symptoms.  I hope you feel better soon!

## 2024-03-13 NOTE — ED Provider Notes (Addendum)
 [facility]    CSN: [id number] Arrival date & time: 03/13/24  0845      History   Chief Complaint Chief Complaint  Patient presents with   Eye Problem    HPI Jonathan [name] is a 63 y.o. male presents for eye irritation.  Patient reports this morning he developed left eye irritation/redness with foreign body sensation.  Denies any photosensitivity, visual changes, eye pain, eye itching.  Does endorse allergy symptoms for which he is using an oral antihistamine treatment.  States has never had allergy symptoms in his eyes.  Wears glasses no contacts.  No sick contacts.  No URI symptoms.  He used some Systane eyedrops without improvement.  No other concerns at this time.   Eye Problem Associated symptoms: redness     Past Medical History:  Diagnosis Date   Allergic rhinitis    Allergy    Anxiety    Depression    Diabetes mellitus without complication (HCC)    type 2   Heart murmur    per pt told it was functional   History of kidney stones    History of nephrolithiasis    Hyperlipidemia    Hypertension    Sebaceous cyst     Patient Active Problem List   Diagnosis Date Noted   Memory difficulties 02/14/2023   Prediabetes 05/15/2016   Essential hypertension 05/15/2016   Hyperlipidemia 05/15/2016   Dyspepsia and other specified disorders of function of stomach 10/25/2013   Change in bowel habits 09/07/2013   Medication management 09/07/2013   Heartburn symptom 09/07/2013   Hyperglycemia 09/04/2013   BMI 31.0-31.9,adult 05/07/2013   SKIN LESION, UNCERTAIN SIGNIFICANCE 03/31/2010   NEPHROLITHIASIS, HX OF 02/10/2008   HYPERLIPIDEMIA 02/06/2008   Anxiety state 02/06/2008   DEPRESSION 02/06/2008   Attention deficit disorder 02/06/2008   Essential hypertension 02/06/2008   Allergic rhinitis 02/06/2008    Past Surgical History:  Procedure Laterality Date   COLONOSCOPY  07/2010   Arlyce Dice   CYSTOSCOPY/URETEROSCOPY/HOLMIUM LASER/STENT PLACEMENT Left  01/15/2020   Procedure: CYSTOSCOPY/URETEROSCOPY/HOLMIUM LASER/STENT PLACEMENT;  Surgeon: Rene Paci, MD;  Location: [facility];  Service: Urology;  Laterality: Left;   kidney stones removed  cystoscopy   x 2   TONSILLECTOMY     x 2   TYMPANOSTOMY TUBE PLACEMENT  as child       Home Medications    Prior to Admission medications   Medication Sig Start Date End Date Taking? Authorizing Provider  azelastine (OPTIVAR) 0.05 % ophthalmic solution Place 1 drop into the left eye 2 (two) times daily as needed (eye redness, itching, irritation). 03/13/24  Yes Radford Pax, NP  ALPRAZolam Prudy Feeler) 0.5 MG tablet Take 1/2-1 tab as needed every 4-6 hours as needed for severe anxiety and panic 10/16/23   Corie Chiquito, PMHNP  B Complex Vitamins (VITAMIN B COMPLEX PO) Take by mouth. Patient not taking: Reported on 01/05/2024    [provider]  buPROPion (WELLBUTRIN XL) 150 MG 24 hr tablet Take 1 tablet (150 mg total) by mouth daily. 09/15/23   Corie Chiquito, PMHNP  buPROPion ER [facility] SR) 100 MG 12 hr tablet Take 1 tablet (100 mg total) by mouth daily in the afternoon. 10/16/23   Corie Chiquito, PMHNP  fluticasone (FLONASE) 50 MCG/ACT nasal spray Place 1 spray into both nostrils daily. Begin by using 2 sprays in each nare daily for 3 to 5 days, then decrease to 1 spray in each nare daily. 03/17/22   Lequita Halt,  Lindsay Scales, PA-C  losartan-hydrochlorothiazide (HYZAAR) 50-12.5 MG tablet Take 2 tablets by mouth daily. 12/18/23   Worthy Rancher B, FNP  metFORMIN (GLUCOPHAGE-XR) 500 MG 24 hr tablet TAKE 3 TABLETS DAILY 01/26/24   Panosh, Neta Mends, MD  Omega-3 Fatty Acids (FISH OIL PO) Take by mouth. Patient not taking: Reported on 01/30/2024    [provider]  Probiotic Product (PROBIOTIC PO) Take 1 tablet by mouth every other day.    [provider]  rosuvastatin (CRESTOR) 5 MG tablet TAKE 5 MG BY MOUTH 5 DAYS PER WEEK FOR 2 WEEKS AND THEN INCREASE TO  5 MG 7 DAYS PER WEEK AS TOLERATED 07/25/23   Panosh, Neta Mends, MD  tamsulosin (FLOMAX) 0.4 MG CAPS capsule Take 0.4 mg by mouth daily. 11/16/23   [provider]  Vilazodone HCl (VIIBRYD) 10 MG TABS TAKE 1/2 TABLET DAILY WITH BREAKFAST FOR ONE WEEK, THEN INCREASE TO 1 TABLET DAILY WITH BREAKFAST 03/29/22   [provider]  Vilazodone HCl 20 MG TABS Take 1 tablet (20 mg total) by mouth daily with breakfast AND 0.5 tablets (10 mg total) daily with supper. 10/16/23 01/14/24  Corie Chiquito, PMHNP    Family History Family History  Problem Relation Age of Onset   Diabetes Father    Hypertension Father    Kidney cancer Father        now on dialysis   Kidney Stones Father    Kidney Stones Brother    Huntington's disease Maternal Grandfather    Depression Daughter    Anxiety disorder Daughter    ADD / ADHD Daughter    Colon polyps Daughter 7   Colon cancer Neg Hx    Esophageal cancer Neg Hx    Stomach cancer Neg Hx    Rectal cancer Neg Hx     Social History Social History   Tobacco Use   Smoking status: Never   Smokeless tobacco: Never  Vaping Use   Vaping status: Never Used  Substance Use Topics   Alcohol use: Yes    Comment: less than 1 a week   Drug use: No     Allergies   Statins, Zocor [simvastatin], and Penicillins   Review of Systems Review of Systems  Eyes:  Positive for redness.     Physical Exam Triage Vital Signs ED Triage Vitals  Encounter Vitals Group     BP 03/13/24 0914 129/80     Systolic BP Percentile --      Diastolic BP Percentile --      Pulse Rate 03/13/24 0913 61     Resp 03/13/24 0913 16     Temp 03/13/24 0914 97.9 F (36.6 C)     Temp Source 03/13/24 0914 Oral     SpO2 03/13/24 0913 94 %     Weight --      Height --      Head Circumference --      Peak Flow --      Pain Score 03/13/24 0913 6     Pain Loc --      Pain Education --      Exclude from Growth Chart --    No data found.  Updated Vital Signs BP 129/80  (BP Location: Left Arm)   Pulse 61   Temp 97.9 F (36.6 C) (Oral)   Resp 16   SpO2 94%   Visual Acuity Right Eye Distance:   Left Eye Distance:   Bilateral Distance:    Right Eye Near:  Left Eye Near:    Bilateral Near:     Physical Exam Vitals and nursing note reviewed.  Constitutional:      General: He is not in acute distress.    Appearance: Normal appearance. He is not ill-appearing.  HENT:     Head: Normocephalic and atraumatic.  Eyes:     General: Lids are normal.        Left eye: No foreign body, discharge or hordeolum.     Extraocular Movements: Extraocular movements intact.     Conjunctiva/sclera:     Left eye: Left conjunctiva is injected. No chemosis, exudate or hemorrhage.    Pupils: Pupils are equal, round, and reactive to light.     Left eye: No corneal abrasion or fluorescein uptake.  Cardiovascular:     Rate and Rhythm: Normal rate.  Pulmonary:     Effort: Pulmonary effort is normal.  Skin:    General: Skin is warm and dry.  Neurological:     General: No focal deficit present.     Mental Status: He is alert and oriented to person, place, and time.  Psychiatric:        Mood and Affect: Mood normal.        Behavior: Behavior normal.      UC Treatments / Results  Labs (all labs ordered are listed, but only abnormal results are displayed) Labs Reviewed - No data to display  EKG   Radiology No results found.  Procedures Procedures (including critical care time)  Medications Ordered in UC Medications - No data to display  Initial Impression / Assessment and Plan / UC Course  I have reviewed the triage vital signs and the nursing notes.  Pertinent labs & imaging results that were available during my care of the patient were reviewed by me and considered in my medical decision making (see chart for details).     Reviewed exam and symptoms with patient.  No red flags.  Negative fluorescein uptake.  Discussed allergic conjunctivitis.   Trial of allergy eyedrops and OTC allergy medicine.  Warm compresses as needed.  PCP follow-up if symptoms do not improve.  ER precautions reviewed and patient verbalized understanding. Final Clinical Impressions(s) / UC Diagnoses   Final diagnoses:  Allergic conjunctivitis of left eye     Discharge Instructions      Start allergy eyedrops twice daily as needed.  Also start an over-the-counter allergy medicine such as Claritin or Zyrtec daily for the next 7 to 14 days.  You may do warm compresses to your eye as needed.  Please follow-up with your PCP if your symptoms do not improve.  Please go to the ER if you develop any worsening symptoms.  I hope you feel better soon!    ED Prescriptions     Medication Sig Dispense Auth. Provider   azelastine (OPTIVAR) 0.05 % ophthalmic solution Place 1 drop into the left eye 2 (two) times daily as needed (eye redness, itching, irritation). 6 mL Radford Pax, NP      PDMP not reviewed this encounter.   Radford Pax, NP 03/13/24 0946    Radford Pax, NP 03/13/24 [phone]

## 2024-03-13 NOTE — ED Triage Notes (Signed)
 Pt presents with c/o lt eye irritation and states it is hard to keep his eye open. Started around 0500 and has applied Systane, has not had relief.

## 2024-04-03 DIAGNOSIS — M9901 Segmental and somatic dysfunction of cervical region: Secondary | ICD-10-CM | POA: Diagnosis not present

## 2024-04-03 DIAGNOSIS — M9903 Segmental and somatic dysfunction of lumbar region: Secondary | ICD-10-CM | POA: Diagnosis not present

## 2024-04-03 DIAGNOSIS — M9902 Segmental and somatic dysfunction of thoracic region: Secondary | ICD-10-CM | POA: Diagnosis not present

## 2024-04-11 ENCOUNTER — Encounter: Payer: Self-pay | Admitting: Gastroenterology

## 2024-04-20 ENCOUNTER — Other Ambulatory Visit: Payer: Self-pay | Admitting: Internal Medicine

## 2024-04-24 ENCOUNTER — Other Ambulatory Visit: Payer: Self-pay

## 2024-04-24 DIAGNOSIS — F902 Attention-deficit hyperactivity disorder, combined type: Secondary | ICD-10-CM

## 2024-04-24 DIAGNOSIS — F3342 Major depressive disorder, recurrent, in full remission: Secondary | ICD-10-CM

## 2024-04-24 MED ORDER — BUPROPION HCL ER (XL) 150 MG PO TB24: 150.0000 mg | ORAL_TABLET | Freq: Every day | ORAL | 0 refills | Status: DC

## 2024-04-26 ENCOUNTER — Other Ambulatory Visit: Payer: Self-pay | Admitting: Psychiatry

## 2024-05-02 ENCOUNTER — Ambulatory Visit: Admitting: Psychology

## 2024-05-02 DIAGNOSIS — F3342 Major depressive disorder, recurrent, in full remission: Secondary | ICD-10-CM

## 2024-05-02 DIAGNOSIS — F902 Attention-deficit hyperactivity disorder, combined type: Secondary | ICD-10-CM | POA: Diagnosis not present

## 2024-05-03 DIAGNOSIS — M542 Cervicalgia: Secondary | ICD-10-CM | POA: Diagnosis not present

## 2024-05-03 NOTE — Progress Notes (Signed)
 [facility] Counselor/Therapist Progress Note  Patient ID: Jonathan [name], MRN: [id number],    Date:05/02/2024  Time Spent: 60 minutes  Time in:  8:01  Time out: 9:01  Treatment Type: Individual Therapy  Reported Symptoms: sadness, inability to pay attention  Mental Status Exam: Appearance:  Casual     Behavior: Appropriate  Motor: Normal  Speech/Language:  Normal Rate  Affect: Appropriate  Mood: pleasant  Thought process: normal  Thought content:   WNL  Sensory/Perceptual disturbances:   WNL  Orientation: oriented to person, place, time/date, and situation  Attention: Good  Concentration: Good  Memory: WNL  Fund of knowledge:  Good  Insight:   Good  Judgment:  Good  Impulse Control: Good   Risk Assessment: Danger to Self:  No Self-injurious Behavior: No Danger to Others: No Duty to Warn:no Physical Aggression / Violence:No  Access to Firearms a concern: No  Gang Involvement:No   Subjective: The patient attended a face-to-face individual therapy session in the office today.  The patient presents as pleasant and cooperative.  The patient reports that there has been a lot going on since I last saw him.  His brother has cancer of that and they are not able to go ahead and sell his mother's house because he cannot feels like thinking pushed the situation because of the cancer.  In addition he reports that his wife's father's estate is on hold for now.  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  We talked about the value of doing that so that he can figure out if he is going to be able to do that without the money in the bank.  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    Interventions: Cognitive Behavioral Therapy and  Insight-Oriented  Diagnosis:Recurrent major depressive disorder, in full remission (HCC)  Attention deficit hyperactivity disorder (ADHD), combined type  Plan: Client Abilities/Strengths  Intelligent, insightful, motivated  Client Treatment Preferences  Outpatient individual therapy  Client Statement of Needs  "I decided I needed therapy to help me sort through a situation I am dealing with"  Treatment Level  Outpatient Individual therapy  Symptoms  Increased defensiveness with his wife: (Status: improved). Passive -  aggressive behaviors: (Status: improved).  Problems Addressed  adjustment disorder, Vocational Stress, Vocational Stress, Vocational Stress, Vocational Stress,  Vocational Stress  Goals 1. Decrease incidents of exhibiting passive aggressive behavior Objective Learn and implement problem-solving skills. Target Date: 2024-09-15 Frequency: Monthly Progress: 90 Modality: individual Related Interventions 1. Conduct Problem-Solving Therapy (see Problem-Solving Therapy by Donnice Gale and Nezu) using techniques such as psychoeducation, modeling, and role-playing to teach the client problem solving skills (i.e., defining a problem specifically, generating possible solutions, evaluating the  pros and cons of each solution, selecting and implementing a plan of action, evaluating the  efficacy of the plan, accepting or revising the plan); role-play application of the problemsolving skill to a real life issue (or assign "Applying Problem-Solving to Interpersonal Conflict" in the Adult Psychotherapy Homework Planner by Beacher Bottoms). 2. Improve satisfaction and comfort surrounding coworker relationships. 3. Increase job satisfaction and performance due to implementation of  assertiveness and stress management strategies. 4. Increase sense of confidence and competence in dealing with work  responsibilities. Objective  Verbalize healthy, realistic cognitive messages that promote harmony  with others, self-acceptance, and  self-confidence. Target Date: 2024-09-15 Frequency: Monthly Progress: 90 Modality: individual 5. Increase sense of self-esteem and elevation of mood in spite of  unemployment. 6. Pursue employment consistency with a reasonably hopeful and positive attitude. 7.  Deal with lifes issues by gaining wisdom and knowledge and implementing wisdom gained.  Diagnosis Adult ADD 296.31 (Major depressive affective disorder, recurrent episode in full remission) - 309.28 (Adjustment disorder with mixed anxiety and depressed mood) - Conditions For Discharge Achievement of treatment goals and objectives  The patient approved this plan and is making progress.  Jonathan [name] Jonathan Tuccillo, LCSW

## 2024-05-07 ENCOUNTER — Encounter: Payer: Self-pay | Admitting: Adult Health

## 2024-05-07 ENCOUNTER — Telehealth: Admitting: Adult Health

## 2024-05-07 DIAGNOSIS — F3342 Major depressive disorder, recurrent, in full remission: Secondary | ICD-10-CM

## 2024-05-07 DIAGNOSIS — F410 Panic disorder [episodic paroxysmal anxiety] without agoraphobia: Secondary | ICD-10-CM

## 2024-05-07 DIAGNOSIS — F902 Attention-deficit hyperactivity disorder, combined type: Secondary | ICD-10-CM

## 2024-05-07 MED ORDER — VILAZODONE HCL 20 MG PO TABS: ORAL_TABLET | ORAL | 1 refills | Status: DC

## 2024-05-07 MED ORDER — BUPROPION HCL ER (XL) 150 MG PO TB24: 150.0000 mg | ORAL_TABLET | Freq: Every day | ORAL | 1 refills | Status: DC

## 2024-05-07 MED ORDER — BUPROPION HCL ER (SR) 150 MG PO TB12: 150.0000 mg | ORAL_TABLET | Freq: Every day | ORAL | 1 refills | Status: DC

## 2024-05-07 NOTE — Progress Notes (Signed)
 Jonathan [name] [id number] 27-Aug-1961 63 y.o.  Virtual Visit via Video Note  I connected with pt @ on 05/07/24 at  5:00 PM EDT by a video enabled telemedicine application and verified that I am speaking with the correct person using two identifiers.   I discussed the limitations of evaluation and management by telemedicine and the availability of in person appointments. The patient expressed understanding and agreed to proceed.  I discussed the assessment and treatment plan with the patient. The patient was provided an opportunity to ask questions and all were answered. The patient agreed with the plan and demonstrated an understanding of the instructions.   The patient was advised to call back or seek an in-person evaluation if the symptoms worsen or if the condition fails to improve as anticipated.  I provided 25 minutes of non-face-to-face time during this encounter.  The patient was located at home.  The provider was located at [facility].   Reagan Camera, NP   Subjective:   Patient ID:  Jonathan [name] is a 63 y.o. (DOB [birth year]) male.  Chief Complaint: No chief complaint on file.   HPI Jonathan [name] presents for follow-up of ADHD, anxiety and depression.   Previously seen by Roselyn Connor.   Describes mood today as "ok". Pleasant. Denies tearfulness. Mood symptoms - denies depression and anxiety. Reports stable interest and motivation. Reports irritability at times - "more frustration". Denies panic attacks. Reports over thinking. Denies worry and rumination. Reports mood is stable. Stating "I feel like I'm doing alright". Taking medications as prescribed.  Energy levels stable. Active, does not have a regular exercise routine - walking. Enjoys some usual interests and activities. Spending time with family. Appetite adequate. Weight stable. Sleeps well most nights. Averages 7 hours. Focus and concentration improved with Wellbutrin  - would like to  increase evening dose of SR 100mg  to 150mg . Completing tasks. Managing aspects of household. Working full time [employer]. Denies SI or HI.  Denies AH or VH. Denies self harm. Denies substance use.  Past Psychiatric Medication Trials: Citalopram - Sexual side effects, fatigue. Partially effective for depression. Increased to 15 mg in the last month and noticed more fatigue. He reports that he has been taking 10 mg most of the time since then.  Trintellix - Improved mood, anxiety, and focus on 5 mg. Excessive somnolence on 10 mg. Pristiq  Wellbutrin - Took 25 years ago. Cannot recall response. Concerta - Effective. Raised BP. Caused jitteriness.  Modafinil -Helpful for concentration. Caused some edginess  Review of Systems:  Review of Systems  Musculoskeletal:  Negative for gait problem.  Neurological:  Negative for tremors.  Psychiatric/Behavioral:         Please refer to HPI    Medications: I have reviewed the patient's current medications.  Current Outpatient Medications  Medication Sig Dispense Refill   ALPRAZolam  (XANAX ) 0.5 MG tablet Take 1/2-1 tab as needed every 4-6 hours as needed for severe anxiety and panic 30 tablet 0   azelastine  (OPTIVAR ) 0.05 % ophthalmic solution Place 1 drop into the left eye 2 (two) times daily as needed (eye redness, itching, irritation). 6 mL 0   B Complex Vitamins (VITAMIN B COMPLEX PO) Take by mouth. (Patient not taking: Reported on 01/05/2024)     buPROPion  (WELLBUTRIN  SR) 150 MG 12 hr tablet Take 1 tablet (150 mg total) by mouth daily in the afternoon. 90 tablet 1   buPROPion  (WELLBUTRIN  XL) 150 MG 24 hr tablet Take 1 tablet (150 mg total) by mouth daily.  90 tablet 1   fluticasone  (FLONASE ) 50 MCG/ACT nasal spray Place 1 spray into both nostrils daily. Begin by using 2 sprays in each nare daily for 3 to 5 days, then decrease to 1 spray in each nare daily. 32 mL 1   losartan -hydrochlorothiazide  (HYZAAR) 50-12.5 MG tablet Take 2 tablets by  mouth daily. 180 tablet 3   metFORMIN  (GLUCOPHAGE -XR) 500 MG 24 hr tablet TAKE 3 TABLETS DAILY 270 tablet 0   Omega-3 Fatty Acids (FISH OIL PO) Take by mouth. (Patient not taking: Reported on 01/30/2024)     Probiotic Product (PROBIOTIC PO) Take 1 tablet by mouth every other day.     rosuvastatin  (CRESTOR ) 5 MG tablet TAKE 5 MG BY MOUTH 5 DAYS PER WEEK FOR 2 WEEKS AND THEN INCREASE TO 5 MG 7 DAYS PER WEEK AS TOLERATED 60 tablet 5   tamsulosin  (FLOMAX ) 0.4 MG CAPS capsule Take 0.4 mg by mouth daily.     Vilazodone  HCl 20 MG TABS Take 1 tablet (20 mg total) by mouth daily with breakfast AND 0.5 tablets (10 mg total) daily with supper. 135 tablet 1   No current facility-administered medications for this visit.    Medication Side Effects: None  Allergies:  Allergies  Allergen Reactions   Statins Other (See Comments)    myalgias     Zocor [Simvastatin] Other (See Comments)     Myalgias, joint pain   Penicillins Hives    Per pt - happened in childhood - hives possible reaction    Past Medical History:  Diagnosis Date   Allergic rhinitis    Allergy    Anxiety    Depression    Diabetes mellitus without complication (HCC)    type 2   Heart murmur    per pt told it was functional   History of kidney stones    History of nephrolithiasis    Hyperlipidemia    Hypertension    Sebaceous cyst     Family History  Problem Relation Age of Onset   Diabetes Father    Hypertension Father    Kidney cancer Father        now on dialysis   Kidney Stones Father    Kidney Stones Brother    Huntington's disease Maternal Grandfather    Depression Daughter    Anxiety disorder Daughter    ADD / ADHD Daughter    Colon polyps Daughter 82   Colon cancer Neg Hx    Esophageal cancer Neg Hx    Stomach cancer Neg Hx    Rectal cancer Neg Hx     Social History   Socioeconomic History   Marital status: Married    Spouse name: Not on file   Number of children: 1   Years of education: 18    Highest education level: Not on file  Occupational History   Occupation: MINISTER    Employer: [employer]  Tobacco Use   Smoking status: Never   Smokeless tobacco: Never  Vaping Use   Vaping status: Never Used  Substance and Sexual Activity   Alcohol use: Yes    Comment: less than 1 a week   Drug use: No   Sexual activity: Yes  Other Topics Concern   Not on file  Social History Narrative   Married   Regular exercise- no   HH of 3   Dog car and lizard   Sleep wakening at times   New job [employer]   Per week reg sleep.  Social Drivers of Corporate investment banker Strain: Not on file  Food Insecurity: No Food Insecurity (08/12/2021)   Received from [facility], [facility]   Hunger Vital Sign    Worried About Running Out of Food in the Last Year: Never true    Ran Out of Food in the Last Year: Never true  Transportation Needs: Not on file  Physical Activity: Not on file  Stress: Not on file  Social Connections: Unknown (04/19/2022)   Received from [facility], [facility]   Social Network    Social Network: Not on file  Intimate Partner Violence: Unknown (03/11/2022)   Received from [facility], [facility]   HITS    Physically Hurt: Not on file    Insult or Talk Down To: Not on file    Threaten Physical Harm: Not on file    Scream or Curse: Not on file    Past Medical History, Surgical history, Social history, and Family history were reviewed and updated as appropriate.   Please see review of systems for further details on the patient's review from today.   Objective:   Physical Exam:  There were no vitals taken for this visit.  Physical Exam Constitutional:      General: He is not in acute distress. Musculoskeletal:        General: No deformity.  Neurological:     Mental Status: He is alert and oriented to person, place, and time.     Coordination: Coordination normal.  Psychiatric:        Attention and Perception: Attention  and perception normal. He does not perceive auditory or visual hallucinations.        Mood and Affect: Mood normal. Mood is not anxious or depressed. Affect is not labile, blunt, angry or inappropriate.        Speech: Speech normal.        Behavior: Behavior normal.        Thought Content: Thought content normal. Thought content is not paranoid or delusional. Thought content does not include homicidal or suicidal ideation. Thought content does not include homicidal or suicidal plan.        Cognition and Memory: Cognition and memory normal.        Judgment: Judgment normal.     Comments: Insight intact     Lab Review:     Component Value Date/Time   NA 140 01/23/2023 0851   NA 141 08/17/2021 0000   K 4.2 01/23/2023 0851   CL 103 01/23/2023 0851   CO2 28 01/23/2023 0851   GLUCOSE 117 (H) 01/23/2023 0851   BUN 13 01/23/2023 0851   CREATININE 1.31 01/23/2023 0851   CREATININE 1.15 10/19/2020 1206   CALCIUM  9.7 01/23/2023 0851   PROT 6.6 01/23/2023 0851   ALBUMIN 4.5 01/23/2023 0851   AST 21 01/23/2023 0851   ALT 22 01/23/2023 0851   ALKPHOS 56 01/23/2023 0851   BILITOT 0.8 01/23/2023 0851   GFRNONAA 72.1 08/17/2021 0000   GFRNONAA 68 10/19/2020 1206   GFRAA 83.5 08/17/2021 0000   GFRAA 79 10/19/2020 1206       Component Value Date/Time   WBC 10.3 01/23/2023 0851   RBC 5.36 01/23/2023 0851   HGB 16.7 01/23/2023 0851   HCT 48.6 01/23/2023 0851   PLT 246.0 01/23/2023 0851   MCV 90.7 01/23/2023 0851   MCV 94.2 04/29/2013 0936   MCH 30.9 10/19/2020 1206   MCHC 34.4 01/23/2023 0851   RDW 13.0 01/23/2023  0851   LYMPHSABS 2.7 01/23/2023 0851   MONOABS 0.8 01/23/2023 0851   EOSABS 0.6 01/23/2023 0851   BASOSABS 0.1 01/23/2023 0851    No results found for: "POCLITH", "LITHIUM"   No results found for: "PHENYTOIN", "PHENOBARB", "VALPROATE", "CBMZ"   .res Assessment: Plan:    Plan:   Wellbutrin  SR 100 mg daily in the afternoon for mood and off-label indication of ADHD.   Wellbutrin  XL150 mg daily in the morning for depression.  Viibryd  20 mg in the morning and 10 mg daily in the afternoon for anxiety and depression.  Alprazolam  0.5 mg 1/2-1 tab as needed for anxiety and panic.   Recommend continuing therapy with Bambi Cottle, LCSW.  RTC 6 months  25 minutes spent dedicated to the care of this patient on the date of this encounter to include pre-visit review of records, ordering of medication, post visit documentation, and face-to-face time with the patient discussing response to addition of Wellbutrin  SR in the afternoon. Pt reports that he feels Wellbutrin  SR has been helpful for his mood, energy, motivation, and concentration, but would like to increase the dose to 150mg  in the evening.  Patient advised to contact office with any questions, adverse effects, or acute worsening in signs and symptoms.  Diagnoses and all orders for this visit:  Recurrent major depressive disorder, in full remission (HCC) -     buPROPion  (WELLBUTRIN  XL) 150 MG 24 hr tablet; Take 1 tablet (150 mg total) by mouth daily. -     buPROPion  (WELLBUTRIN  SR) 150 MG 12 hr tablet; Take 1 tablet (150 mg total) by mouth daily in the afternoon. -     Vilazodone  HCl 20 MG TABS; Take 1 tablet (20 mg total) by mouth daily with breakfast AND 0.5 tablets (10 mg total) daily with supper.  Attention deficit hyperactivity disorder (ADHD), combined type -     buPROPion  (WELLBUTRIN  XL) 150 MG 24 hr tablet; Take 1 tablet (150 mg total) by mouth daily. -     buPROPion  (WELLBUTRIN  SR) 150 MG 12 hr tablet; Take 1 tablet (150 mg total) by mouth daily in the afternoon.  Panic -     Vilazodone  HCl 20 MG TABS; Take 1 tablet (20 mg total) by mouth daily with breakfast AND 0.5 tablets (10 mg total) daily with supper.     Please see After Visit Summary for patient specific instructions.  Future Appointments  Date Time Provider Department Center  05/30/2024  8:00 AM Cottle, Bambi G, LCSW [id number] None     No orders of the defined types were placed in this encounter.     -------------------------------

## 2024-05-08 ENCOUNTER — Ambulatory Visit: Admitting: Psychology

## 2024-05-15 ENCOUNTER — Ambulatory Visit: Admitting: Psychology

## 2024-05-16 DIAGNOSIS — M542 Cervicalgia: Secondary | ICD-10-CM | POA: Diagnosis not present

## 2024-05-16 DIAGNOSIS — S39012A Strain of muscle, fascia and tendon of lower back, initial encounter: Secondary | ICD-10-CM | POA: Diagnosis not present

## 2024-05-17 DIAGNOSIS — M542 Cervicalgia: Secondary | ICD-10-CM | POA: Diagnosis not present

## 2024-05-19 ENCOUNTER — Emergency Department (HOSPITAL_COMMUNITY)

## 2024-05-19 ENCOUNTER — Encounter (HOSPITAL_COMMUNITY): Payer: Self-pay | Admitting: Emergency Medicine

## 2024-05-19 ENCOUNTER — Emergency Department (HOSPITAL_COMMUNITY)
Admission: EM | Admit: 2024-05-19 | Discharge: 2024-05-19 | Disposition: A | Discharge status: Home / Self Care | Attending: Emergency Medicine | Admitting: Emergency Medicine

## 2024-05-19 ENCOUNTER — Other Ambulatory Visit: Payer: Self-pay

## 2024-05-19 ENCOUNTER — Telehealth (HOSPITAL_COMMUNITY): Payer: Self-pay | Admitting: Emergency Medicine

## 2024-05-19 DIAGNOSIS — R109 Unspecified abdominal pain: Secondary | ICD-10-CM | POA: Insufficient documentation

## 2024-05-19 DIAGNOSIS — I10 Essential (primary) hypertension: Secondary | ICD-10-CM | POA: Diagnosis not present

## 2024-05-19 DIAGNOSIS — K5730 Diverticulosis of large intestine without perforation or abscess without bleeding: Secondary | ICD-10-CM | POA: Diagnosis not present

## 2024-05-19 DIAGNOSIS — M545 Low back pain, unspecified: Secondary | ICD-10-CM | POA: Diagnosis not present

## 2024-05-19 DIAGNOSIS — N200 Calculus of kidney: Secondary | ICD-10-CM | POA: Diagnosis not present

## 2024-05-19 DIAGNOSIS — R55 Syncope and collapse: Secondary | ICD-10-CM | POA: Diagnosis not present

## 2024-05-19 DIAGNOSIS — K5792 Diverticulitis of intestine, part unspecified, without perforation or abscess without bleeding: Secondary | ICD-10-CM | POA: Diagnosis not present

## 2024-05-19 DIAGNOSIS — N281 Cyst of kidney, acquired: Secondary | ICD-10-CM | POA: Diagnosis not present

## 2024-05-19 DIAGNOSIS — R1084 Generalized abdominal pain: Secondary | ICD-10-CM | POA: Diagnosis not present

## 2024-05-19 DIAGNOSIS — R918 Other nonspecific abnormal finding of lung field: Secondary | ICD-10-CM | POA: Diagnosis not present

## 2024-05-19 DIAGNOSIS — M549 Dorsalgia, unspecified: Secondary | ICD-10-CM | POA: Diagnosis not present

## 2024-05-19 DIAGNOSIS — I701 Atherosclerosis of renal artery: Secondary | ICD-10-CM | POA: Diagnosis not present

## 2024-05-19 LAB — COMPREHENSIVE METABOLIC PANEL WITH GFR
ALT: 21 U/L (ref 0–44)
AST: 22 U/L (ref 15–41)
Albumin: 4.3 g/dL (ref 3.5–5.0)
Alkaline Phosphatase: 54 U/L (ref 38–126)
Anion gap: 11 (ref 5–15)
BUN: 23 mg/dL (ref 8–23)
CO2: 22 mmol/L (ref 22–32)
Calcium: 9.6 mg/dL (ref 8.9–10.3)
Chloride: 106 mmol/L (ref 98–111)
Creatinine, Ser: 1.32 mg/dL — ABNORMAL HIGH (ref 0.61–1.24)
GFR, Estimated: >60 mL/min (ref >60)
Glucose, Bld: 124 mg/dL — ABNORMAL HIGH (ref 70–99)
Potassium: 3.8 mmol/L (ref 3.5–5.1)
Sodium: 139 mmol/L (ref 135–145)
Total Bilirubin: 0.8 mg/dL (ref 0.0–1.2)
Total Protein: 6.9 g/dL (ref 6.5–8.1)

## 2024-05-19 LAB — CBC WITH DIFFERENTIAL/PLATELET
Abs Immature Granulocytes: 0.06 10*3/uL (ref 0.00–0.07)
Basophils Absolute: 0.1 10*3/uL (ref 0.0–0.1)
Basophils Relative: 1 %
Eosinophils Absolute: 0.2 10*3/uL (ref 0.0–0.5)
Eosinophils Relative: 2 %
HCT: 47.7 % (ref 39.0–52.0)
Hemoglobin: 16.1 g/dL (ref 13.0–17.0)
Immature Granulocytes: 0 %
Lymphocytes Relative: 10 %
Lymphs Abs: 1.5 10*3/uL (ref 0.7–4.0)
MCH: 31 pg (ref 26.0–34.0)
MCHC: 33.8 g/dL (ref 30.0–36.0)
MCV: 91.7 fL (ref 80.0–100.0)
Monocytes Absolute: 1.1 10*3/uL — ABNORMAL HIGH (ref 0.1–1.0)
Monocytes Relative: 8 %
Neutro Abs: 11.2 10*3/uL — ABNORMAL HIGH (ref 1.7–7.7)
Neutrophils Relative %: 79 %
Platelets: 247 10*3/uL (ref 150–400)
RBC: 5.2 MIL/uL (ref 4.22–5.81)
RDW: 12.9 % (ref 11.5–15.5)
WBC: 14.1 10*3/uL — ABNORMAL HIGH (ref 4.0–10.5)
nRBC: 0 % (ref 0.0–0.2)

## 2024-05-19 LAB — URINALYSIS, W/ REFLEX TO CULTURE (INFECTION SUSPECTED)
Bacteria, UA: NONE SEEN
Bilirubin Urine: NEGATIVE
Glucose, UA: 50 mg/dL — AB
Hgb urine dipstick: NEGATIVE
Ketones, ur: NEGATIVE mg/dL
Leukocytes,Ua: NEGATIVE
Nitrite: NEGATIVE
Protein, ur: NEGATIVE mg/dL
Specific Gravity, Urine: 1.023 (ref 1.005–1.030)
pH: 5 (ref 5.0–8.0)

## 2024-05-19 MED ORDER — SODIUM CHLORIDE 0.9 % IV BOLUS
1000.0000 mL | Freq: Once | INTRAVENOUS | Status: AC
  Administered 2024-05-19: 1000 mL via INTRAVENOUS

## 2024-05-19 MED ORDER — KETOROLAC TROMETHAMINE 15 MG/ML IJ SOLN
15.0000 mg | Freq: Once | INTRAMUSCULAR | Status: AC
  Administered 2024-05-19: 15 mg via INTRAVENOUS
  Filled 2024-05-19: qty 1

## 2024-05-19 MED ORDER — ONDANSETRON 4 MG PO TBDP: 4.0000 mg | ORAL_TABLET | Freq: Three times a day (TID) | ORAL | 0 refills | Status: AC | PRN

## 2024-05-19 MED ORDER — ONDANSETRON HCL 4 MG/2ML IJ SOLN
4.0000 mg | Freq: Once | INTRAMUSCULAR | Status: AC
  Administered 2024-05-19: 4 mg via INTRAVENOUS
  Filled 2024-05-19: qty 2

## 2024-05-19 MED ORDER — MORPHINE SULFATE (PF) 4 MG/ML IV SOLN
4.0000 mg | Freq: Once | INTRAVENOUS | Status: AC
  Administered 2024-05-19: 4 mg via INTRAVENOUS
  Filled 2024-05-19: qty 1

## 2024-05-19 MED ORDER — IOHEXOL 350 MG/ML SOLN
100.0000 mL | Freq: Once | INTRAVENOUS | Status: AC | PRN
  Administered 2024-05-19: 100 mL via INTRAVENOUS

## 2024-05-19 NOTE — Discharge Instructions (Signed)
 Return for any problem.   Follow up closely with Orthopedics and PCP tomorrow as instructed.

## 2024-05-19 NOTE — Telephone Encounter (Cosign Needed)
 Prescription of zofran  sent to the pharmacy

## 2024-05-19 NOTE — ED Notes (Signed)
 Pt Ambulated to bathroom, made aware of needing urine sample

## 2024-05-19 NOTE — ED Triage Notes (Signed)
 Pt bib [facility] for left flank pain that started today sweaty and pale 88pal pressure by ems upon arrival.  157/70s 80hr nsr  Cgb 155

## 2024-05-19 NOTE — ED Provider Notes (Signed)
 [place] EMERGENCY DEPARTMENT AT [facility] Provider Note   CSN: [id number] Arrival date & time: 05/19/24  1143     Patient presents with: No chief complaint on file.   Jonathan [name] is a 63 y.o. male.   63 year old male with prior medical history as detailed below presents for evaluation.  Patient reports acute left-sided flank pain.  Patient's pain was initially on his left low back.  It is now moved to the left groin.  Patient with history of renal colic in the past.  Describes symptoms today are consistent with prior episodes of renal colic.  Patient did not take anything for his pain.  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  Symptoms became worse.  He called EMS for transport.  He denies fever.  He denies vomiting.  He reports nausea that is mildly improved now.  The history is provided by the patient.       Prior to Admission medications   Medication Sig Start Date End Date Taking? Authorizing Provider  ALPRAZolam  (XANAX ) 0.5 MG tablet Take 1/2-1 tab as needed every 4-6 hours as needed for severe anxiety and panic 10/16/23   Selinda Dales, PMHNP  azelastine  (OPTIVAR ) 0.05 % ophthalmic solution Place 1 drop into the left eye 2 (two) times daily as needed (eye redness, itching, irritation). 03/13/24   Mayer, Jodi R, NP  B Complex Vitamins (VITAMIN B COMPLEX PO) Take by mouth. Patient not taking: Reported on 01/05/2024    [provider]  buPROPion  (WELLBUTRIN  SR) 150 MG 12 hr tablet Take 1 tablet (150 mg total) by mouth daily in the afternoon. 05/07/24   Mozingo, Regina Nattalie, NP  buPROPion  (WELLBUTRIN  XL) 150 MG 24 hr tablet Take 1 tablet (150 mg total) by mouth daily. 05/07/24   Mozingo, Regina Nattalie, NP  fluticasone  (FLONASE ) 50 MCG/ACT nasal spray Place 1 spray into both nostrils daily. Begin by using 2 sprays in each nare daily for 3 to 5 days, then decrease to 1 spray in each nare daily. 03/17/22   Eloise Hake Scales, PA-C   losartan -hydrochlorothiazide  (HYZAAR) 50-12.5 MG tablet Take 2 tablets by mouth daily. 12/18/23   Webb, Padonda B, FNP  metFORMIN  (GLUCOPHAGE -XR) 500 MG 24 hr tablet TAKE 3 TABLETS DAILY 01/26/24   Panosh, Wanda K, MD  Omega-3 Fatty Acids (FISH OIL PO) Take by mouth. Patient not taking: Reported on 01/30/2024    [provider]  Probiotic Product (PROBIOTIC PO) Take 1 tablet by mouth every other day.    [provider]  rosuvastatin  (CRESTOR ) 5 MG tablet TAKE 5 MG BY MOUTH 5 DAYS PER WEEK FOR 2 WEEKS AND THEN INCREASE TO 5 MG 7 DAYS PER WEEK AS TOLERATED 07/25/23   Panosh, Joaquim Muir, MD  tamsulosin  (FLOMAX ) 0.4 MG CAPS capsule Take 0.4 mg by mouth daily. 11/16/23   [provider]  Vilazodone  HCl 20 MG TABS Take 1 tablet (20 mg total) by mouth daily with breakfast AND 0.5 tablets (10 mg total) daily with supper. 05/07/24 08/05/24  Mozingo, Regina Nattalie, NP    Allergies: Statins, Zocor [simvastatin], and Penicillins    Review of Systems  All other systems reviewed and are negative.   Updated Vital Signs Ht 5' 6 (1.676 m)   Wt 91 kg   BMI 32.38 kg/m   Physical Exam Vitals and nursing note reviewed.  Constitutional:      General: He is not in acute distress.    Appearance: Normal appearance. He  is well-developed.  HENT:     Head: Normocephalic and atraumatic.   Eyes:     Conjunctiva/sclera: Conjunctivae normal.     Pupils: Pupils are equal, round, and reactive to light.    Cardiovascular:     Rate and Rhythm: Normal rate and regular rhythm.     Heart sounds: Normal heart sounds.  Pulmonary:     Effort: Pulmonary effort is normal. No respiratory distress.     Breath sounds: Normal breath sounds.  Abdominal:     General: There is no distension.     Palpations: Abdomen is soft.     Tenderness: There is no abdominal tenderness.   Musculoskeletal:        General: No deformity. Normal range of motion.     Cervical back: Normal range of motion and neck  supple.   Skin:    General: Skin is warm and dry.   Neurological:     General: No focal deficit present.     Mental Status: He is alert and oriented to person, place, and time.     (all labs ordered are listed, but only abnormal results are displayed) Labs Reviewed  CBC WITH DIFFERENTIAL/PLATELET  COMPREHENSIVE METABOLIC PANEL WITH GFR    EKG: None  Radiology: No results found.   Procedures   Medications Ordered in the ED  sodium chloride  0.9 % bolus 1,000 mL (has no administration in time range)  ondansetron  (ZOFRAN ) injection 4 mg (has no administration in time range)  morphine  (PF) 4 MG/ML injection 4 mg (has no administration in time range)                                    Medical Decision Making Amount and/or Complexity of Data Reviewed Labs: ordered. Radiology: ordered.  Risk Prescription drug management.    Medical Screen Complete  This patient presented to the ED with complaint of flank pain.  This complaint involves an extensive number of treatment options. The initial differential diagnosis includes, but is not limited to, renal colic, vascular emergency,  metabolic abnormality, etc  This presentation is: Acute, Chronic, Self-Limited, Previously Undiagnosed, Uncertain Prognosis, Complicated, Systemic Symptoms, and Threat to Life/Bodily Function  Patient presents with left flank pain.  Onset of pain was earlier today prior to arrival.  Patient reported acute nausea, sweatiness with the back pain.  Describes symptoms are perhaps most consistent with muscular back pain and related vagal episode.  However, patient reports prior history of renal colic.  Initial CT without contrast is without acute abnormality.  Out of an abundance of caution, CT angio to rule out aortic catastrophe obtained.  This is fortunately normal.  Screening labs and urine are without significant abnormality.  On reevaluation the patient is much improved.  He now desires  discharge home.  Importance of close follow-up was stressed.  Strict return precautions given understood.  Additional history obtained: External records from outside sources obtained and reviewed including prior ED visits and prior Inpatient records.    Problem List / ED Course:  Flank pain   Reevaluation:  After the interventions noted above, I reevaluated the patient and found that they have: resolved  Disposition:  After consideration of the diagnostic results and the patients response to treatment, I feel that the patent would benefit from close outpatient follow-up.       Final diagnoses:  Flank pain    ED Discharge Orders  None          Burnette Carte, MD 05/19/24 [id number]

## 2024-05-20 ENCOUNTER — Telehealth: Payer: Self-pay | Admitting: *Deleted

## 2024-05-20 DIAGNOSIS — M5459 Other low back pain: Secondary | ICD-10-CM | POA: Diagnosis not present

## 2024-05-20 NOTE — Transitions of Care (Post Inpatient/ED Visit) (Signed)
   05/20/2024  Name: Jonathan [name] MRN: [id number] DOB: 04-Nov-1961  Today's TOC FU Call Status: Today's TOC FU Call Status:: Successful TOC FU Call Completed TOC FU Call Complete Date: 05/20/24 Patient's Name and Date of Birth confirmed.  Transition Care Management Follow-up Telephone Call Date of Discharge: 05/19/24 Discharge Facility: [facility]) Type of Discharge: Emergency Department Reason for ED Visit: Other: How have you been since you were released from the hospital?: Same Any questions or concerns?: No  Items Reviewed: Did you receive and understand the discharge instructions provided?: Yes Medications obtained,verified, and reconciled?: Yes (Medications Reviewed) Any new allergies since your discharge?: Yes Dietary orders reviewed?: NA Do you have support at home?: Yes People in Home [RPT]: spouse  Medications Reviewed Today: Medications Reviewed Today   Medications were not reviewed in this encounter     Home Care and Equipment/Supplies: Were Home Health Services Ordered?: No Any new equipment or medical supplies ordered?: No  Functional Questionnaire: Do you need assistance with bathing/showering or dressing?: No Do you need assistance with meal preparation?: No Do you need assistance with eating?: No Do you have difficulty maintaining continence: No Do you need assistance with getting out of bed/getting out of a chair/moving?: No Do you have difficulty managing or taking your medications?: No  Follow up appointments reviewed: PCP Follow-up appointment confirmed?: No MD Provider Line [phone] Given: Yes Specialist Hospital Follow-up appointment confirmed?: Yes Date of Specialist follow-up appointment?: 05/20/24 Follow-Up Specialty Provider:: [facility] Do you need transportation to your follow-up appointment?: No Do you understand care options if your condition(s) worsen?: Yes-patient verbalized understanding    SIGNATURE:  Nicolina Barrios, CMA

## 2024-05-20 NOTE — Telephone Encounter (Signed)
 Patient requested the phone number to follow up.  Sent by [employer].

## 2024-05-24 DIAGNOSIS — M5459 Other low back pain: Secondary | ICD-10-CM | POA: Diagnosis not present

## 2024-05-27 ENCOUNTER — Emergency Department (HOSPITAL_COMMUNITY): Admission: EM | Admit: 2024-05-27 | Discharge: 2024-05-28 | Disposition: A | Discharge status: Home / Self Care

## 2024-05-27 ENCOUNTER — Encounter (HOSPITAL_COMMUNITY): Payer: Self-pay | Admitting: Emergency Medicine

## 2024-05-27 ENCOUNTER — Other Ambulatory Visit: Payer: Self-pay

## 2024-05-27 DIAGNOSIS — M48062 Spinal stenosis, lumbar region with neurogenic claudication: Secondary | ICD-10-CM | POA: Diagnosis not present

## 2024-05-27 DIAGNOSIS — M545 Low back pain, unspecified: Secondary | ICD-10-CM | POA: Diagnosis not present

## 2024-05-27 DIAGNOSIS — M549 Dorsalgia, unspecified: Secondary | ICD-10-CM | POA: Diagnosis not present

## 2024-05-27 DIAGNOSIS — M5416 Radiculopathy, lumbar region: Secondary | ICD-10-CM | POA: Diagnosis not present

## 2024-05-27 DIAGNOSIS — M5442 Lumbago with sciatica, left side: Secondary | ICD-10-CM | POA: Diagnosis not present

## 2024-05-27 DIAGNOSIS — N200 Calculus of kidney: Secondary | ICD-10-CM | POA: Diagnosis not present

## 2024-05-27 LAB — CBC WITH DIFFERENTIAL/PLATELET
Abs Immature Granulocytes: 0.04 10*3/uL (ref 0.00–0.07)
Basophils Absolute: 0.1 10*3/uL (ref 0.0–0.1)
Basophils Relative: 1 %
Eosinophils Absolute: 0.6 10*3/uL — ABNORMAL HIGH (ref 0.0–0.5)
Eosinophils Relative: 4 %
HCT: 53.4 % — ABNORMAL HIGH (ref 39.0–52.0)
Hemoglobin: 18.4 g/dL — ABNORMAL HIGH (ref 13.0–17.0)
Immature Granulocytes: 0 %
Lymphocytes Relative: 24 %
Lymphs Abs: 3 10*3/uL (ref 0.7–4.0)
MCH: 31 pg (ref 26.0–34.0)
MCHC: 34.5 g/dL (ref 30.0–36.0)
MCV: 90.1 fL (ref 80.0–100.0)
Monocytes Absolute: 1.4 10*3/uL — ABNORMAL HIGH (ref 0.1–1.0)
Monocytes Relative: 11 %
Neutro Abs: 7.6 10*3/uL (ref 1.7–7.7)
Neutrophils Relative %: 60 %
Platelets: 314 10*3/uL (ref 150–400)
RBC: 5.93 MIL/uL — ABNORMAL HIGH (ref 4.22–5.81)
RDW: 12.6 % (ref 11.5–15.5)
WBC: 12.6 10*3/uL — ABNORMAL HIGH (ref 4.0–10.5)
nRBC: 0 % (ref 0.0–0.2)

## 2024-05-27 LAB — URINALYSIS, W/ REFLEX TO CULTURE (INFECTION SUSPECTED)
Bacteria, UA: NONE SEEN
Bilirubin Urine: NEGATIVE
Glucose, UA: NEGATIVE mg/dL
Hgb urine dipstick: NEGATIVE
Ketones, ur: 5 mg/dL — AB
Leukocytes,Ua: NEGATIVE
Nitrite: NEGATIVE
Protein, ur: NEGATIVE mg/dL
Specific Gravity, Urine: 1.026 (ref 1.005–1.030)
pH: 5 (ref 5.0–8.0)

## 2024-05-27 LAB — COMPREHENSIVE METABOLIC PANEL WITH GFR
ALT: 21 U/L (ref 0–44)
AST: 28 U/L (ref 15–41)
Albumin: 4.6 g/dL (ref 3.5–5.0)
Alkaline Phosphatase: 59 U/L (ref 38–126)
Anion gap: 12 (ref 5–15)
BUN: 24 mg/dL — ABNORMAL HIGH (ref 8–23)
CO2: 24 mmol/L (ref 22–32)
Calcium: 9.3 mg/dL (ref 8.9–10.3)
Chloride: 97 mmol/L — ABNORMAL LOW (ref 98–111)
Creatinine, Ser: 1.48 mg/dL — ABNORMAL HIGH (ref 0.61–1.24)
GFR, Estimated: 53 mL/min — ABNORMAL LOW (ref >60)
Glucose, Bld: 153 mg/dL — ABNORMAL HIGH (ref 70–99)
Potassium: 3.5 mmol/L (ref 3.5–5.1)
Sodium: 133 mmol/L — ABNORMAL LOW (ref 135–145)
Total Bilirubin: 1.3 mg/dL — ABNORMAL HIGH (ref 0.0–1.2)
Total Protein: 8 g/dL (ref 6.5–8.1)

## 2024-05-27 LAB — LIPASE, BLOOD: Lipase: 32 U/L (ref 11–51)

## 2024-05-27 MED ORDER — DEXAMETHASONE SODIUM PHOSPHATE 10 MG/ML IJ SOLN
10.0000 mg | Freq: Once | INTRAMUSCULAR | Status: AC
  Administered 2024-05-27: 10 mg via INTRAVENOUS
  Filled 2024-05-27: qty 1

## 2024-05-27 MED ORDER — TIZANIDINE HCL 4 MG PO TABS
2.0000 mg | ORAL_TABLET | Freq: Once | ORAL | Status: AC
  Administered 2024-05-27: 2 mg via ORAL
  Filled 2024-05-27: qty 1

## 2024-05-27 MED ORDER — KETOROLAC TROMETHAMINE 15 MG/ML IJ SOLN
15.0000 mg | Freq: Once | INTRAMUSCULAR | Status: AC
  Administered 2024-05-27: 15 mg via INTRAVENOUS
  Filled 2024-05-27: qty 1

## 2024-05-27 MED ORDER — ACETAMINOPHEN 500 MG PO TABS
1000.0000 mg | ORAL_TABLET | Freq: Once | ORAL | Status: AC
  Administered 2024-05-27: 1000 mg via ORAL
  Filled 2024-05-27: qty 2

## 2024-05-27 MED ORDER — OXYCODONE-ACETAMINOPHEN 5-325 MG PO TABS
1.0000 | ORAL_TABLET | ORAL | Status: DC | PRN
  Administered 2024-05-27: 1 via ORAL
  Filled 2024-05-27: qty 1

## 2024-05-27 MED ORDER — LIDOCAINE 5 % EX PTCH
1.0000 | MEDICATED_PATCH | Freq: Once | CUTANEOUS | Status: DC
  Administered 2024-05-27: 1 via TRANSDERMAL
  Filled 2024-05-27: qty 1

## 2024-05-27 MED ORDER — LIDOCAINE 5 % EX PTCH: 1.0000 | MEDICATED_PATCH | CUTANEOUS | 0 refills | Status: AC

## 2024-05-27 MED ORDER — GABAPENTIN 300 MG PO CAPS
300.0000 mg | ORAL_CAPSULE | Freq: Once | ORAL | Status: AC
  Administered 2024-05-27: 300 mg via ORAL
  Filled 2024-05-27: qty 1

## 2024-05-27 MED ORDER — OXYCODONE-ACETAMINOPHEN 5-325 MG PO TABS: 1.0000 | ORAL_TABLET | Freq: Four times a day (QID) | ORAL | 0 refills | Status: DC | PRN

## 2024-05-27 NOTE — ED Provider Triage Note (Signed)
 Emergency Medicine Provider Triage Evaluation Note  Jonathan [name] , a 63 y.o. male  was evaluated in triage.  Pt complains of lower back pain.  Seen here initially in [name] thought kidney stones.  Workup reassuring, subsequently seen by Dr. Bonner with pain management who ordered an MRI.  Still having pain despite tramadol  at home  Review of Systems  Positive: Left back pain Negative:   Physical Exam  BP (!) 139/101 (BP Location: Left Arm)   Pulse 95   Temp 98.3 F (36.8 C) (Oral)   Resp 18   SpO2 97%  Gen:   Awake, no distress   Resp:  Normal effort  MSK:   Moves extremities without difficulty  Other:    Medical Decision Making  Medically screening exam initiated at 8:47 PM.  Appropriate orders placed.  Jonathan [name] was informed that the remainder of the evaluation will be completed by another provider, this initial triage assessment does not replace that evaluation, and the importance of remaining in the ED until their evaluation is complete.  Back pain   Jonathan [name] A, PA-C 05/27/24 2144

## 2024-05-27 NOTE — ED Provider Notes (Signed)
 [place] EMERGENCY DEPARTMENT AT [facility] Provider Note   CSN: [id number] Arrival date & time: 05/27/24  2013     Patient presents with: Back Pain   Jonathan [name] is a 63 y.o. male.  {Add pertinent medical, surgical, social history, OB history to HPI:7959} 63 year old male present emergency department with left low back pain.  Symptoms started around [name].  Seen the emergency department that time with reassuring workup.  Followed up with Ortho has had outpatient MRI which showed some spinal stenosis.  Has had continued pain   History for which she seeks relief from   Back Pain      Prior to Admission medications   Medication Sig Start Date End Date Taking? Authorizing Provider  lidocaine  (LIDODERM ) 5 % Place 1 patch onto the skin daily. Remove & Discard patch within 12 hours or as directed by MD 05/27/24  Yes Neysa Caron PARAS, DO  oxyCODONE -acetaminophen  (PERCOCET/ROXICET) 5-325 MG tablet Take 1 tablet by mouth every 6 (six) hours as needed for severe pain (pain score 7-10). 05/27/24  Yes Neysa Caron PARAS, DO  ALPRAZolam  (XANAX ) 0.5 MG tablet Take 1/2-1 tab as needed every 4-6 hours as needed for severe anxiety and panic Patient taking differently: Take 0.25-0.5 mg by mouth See admin instructions. Take 0.25-0.5 mg by mouth every 4-6 hours as needed for severe anxiety and panic 10/16/23   Franchot Harlene SQUIBB, PMHNP  azelastine  (OPTIVAR ) 0.05 % ophthalmic solution Place 1 drop into the left eye 2 (two) times daily as needed (eye redness, itching, irritation). Patient not taking: Reported on 05/19/2024 03/13/24   Mayer, Jodi R, NP  buPROPion  (WELLBUTRIN  SR) 150 MG 12 hr tablet Take 1 tablet (150 mg total) by mouth daily in the afternoon. Patient not taking: Reported on 05/19/2024 05/07/24   Mozingo, Regina Nattalie, NP  buPROPion  (WELLBUTRIN  XL) 150 MG 24 hr tablet Take 1 tablet (150 mg total) by mouth daily. Patient not taking: Reported on 05/19/2024 05/07/24   Mozingo,  Regina Nattalie, NP  buPROPion  ER (WELLBUTRIN  SR) 100 MG 12 hr tablet Take 100 mg by mouth daily in the afternoon.    [provider]  fluticasone  (FLONASE ) 50 MCG/ACT nasal spray Place 1 spray into both nostrils daily. Begin by using 2 sprays in each nare daily for 3 to 5 days, then decrease to 1 spray in each nare daily. Patient not taking: Reported on 05/19/2024 03/17/22   Joesph Shaver Scales, PA-C  gabapentin (NEURONTIN) 100 MG capsule Take 100 mg by mouth in the morning, at noon, and at bedtime.    [provider]  losartan -hydrochlorothiazide  (HYZAAR) 50-12.5 MG tablet Take 2 tablets by mouth daily. 12/18/23   Webb, Padonda B, FNP  meloxicam  (MOBIC ) 7.5 MG tablet Take 7.5 mg by mouth daily.    [provider]  metFORMIN  (GLUCOPHAGE -XR) 500 MG 24 hr tablet TAKE 3 TABLETS DAILY Patient taking differently: Take 500 mg by mouth 3 (three) times daily with meals. 01/26/24   Panosh, Wanda K, MD  methocarbamol (ROBAXIN) 500 MG tablet Take 500 mg by mouth in the morning and at bedtime.    [provider]  ondansetron  (ZOFRAN -ODT) 4 MG disintegrating tablet Take 1 tablet (4 mg total) by mouth every 8 (eight) hours as needed. 05/19/24 06/18/24  Waddell Sluder, PA-C  rosuvastatin  (CRESTOR ) 5 MG tablet TAKE 5 MG BY MOUTH 5 DAYS PER WEEK FOR 2 WEEKS AND THEN INCREASE TO 5 MG 7 DAYS PER WEEK AS TOLERATED Patient taking differently:  Take 5 mg by mouth in the morning. 07/25/23   Panosh, Apolinar POUR, MD  tamsulosin  (FLOMAX ) 0.4 MG CAPS capsule Take 0.4 mg by mouth daily. 11/16/23   [provider]  Vilazodone  HCl 20 MG TABS Take 1 tablet (20 mg total) by mouth daily with breakfast AND 0.5 tablets (10 mg total) daily with supper. Patient taking differently: Take 20 mg by mouth with breakfast and 10 mg with supper/evening meal 05/07/24 08/05/24  Mozingo, Regina Nattalie, NP    Allergies: Sodium nitrite, Statins, Simvastatin, and Penicillins    Review of Systems   Musculoskeletal:  Positive for back pain.    Updated Vital Signs BP (!) 147/94 (BP Location: Left Arm)   Pulse 69   Temp 97.7 F (36.5 C) (Oral)   Resp 18   SpO2 94%   Physical Exam Vitals and nursing note reviewed.  Constitutional:      General: He is not in acute distress.    Appearance: He is not toxic-appearing.  HENT:     Head: Normocephalic.     Nose: Nose normal.     Mouth/Throat:     Mouth: Mucous membranes are moist.   Eyes:     Conjunctiva/sclera: Conjunctivae normal.    Cardiovascular:     Rate and Rhythm: Normal rate and regular rhythm.  Pulmonary:     Effort: Pulmonary effort is normal.     Breath sounds: Normal breath sounds.  Abdominal:     General: Abdomen is flat. There is no distension.     Palpations: Abdomen is soft.     Tenderness: There is no abdominal tenderness. There is no guarding or rebound.   Musculoskeletal:     Comments: Normal sensation in the lower extremities.  5 and 5 plantarflexion dorsiflexion.   extension.  2+ DP pulses.   Neurological:     Mental Status: He is alert.     (all labs ordered are listed, but only abnormal results are displayed) Labs Reviewed  CBC WITH DIFFERENTIAL/PLATELET - Abnormal; Notable for the following components:      Result Value   WBC 12.6 (*)    RBC 5.93 (*)    Hemoglobin 18.4 (*)    HCT 53.4 (*)    Monocytes Absolute 1.4 (*)    Eosinophils Absolute 0.6 (*)    All other components within normal limits  COMPREHENSIVE METABOLIC PANEL WITH GFR - Abnormal; Notable for the following components:   Sodium 133 (*)    Chloride 97 (*)    Glucose, Bld 153 (*)    BUN 24 (*)    Creatinine, Ser 1.48 (*)    Total Bilirubin 1.3 (*)    GFR, Estimated 53 (*)    All other components within normal limits  LIPASE, BLOOD  URINALYSIS, W/ REFLEX TO CULTURE (INFECTION SUSPECTED)    EKG: None  Radiology: No results found.  {Document cardiac monitor, telemetry assessment procedure when  appropriate:32947} Procedures   Medications Ordered in the ED  oxyCODONE -acetaminophen  (PERCOCET/ROXICET) 5-325 MG per tablet 1 tablet (1 tablet Oral Given 05/27/24 2033)  lidocaine  (LIDODERM ) 5 % 1 patch (1 patch Transdermal Patch Applied 05/27/24 2259)  acetaminophen  (TYLENOL ) tablet 1,000 mg (1,000 mg Oral Given 05/27/24 2251)  ketorolac  (TORADOL ) 15 MG/ML injection 15 mg (15 mg Intravenous Given 05/27/24 2253)  dexamethasone  (DECADRON ) injection 10 mg (10 mg Intravenous Given 05/27/24 2252)  gabapentin (NEURONTIN) capsule 300 mg (300 mg Oral Given 05/27/24 2251)  tiZANidine (ZANAFLEX) tablet 2 mg (2 mg Oral Given 05/27/24  2251)    Clinical Course as of 05/27/24 2348  Mon May 27, 2024  2219 [facility] visit today: IMAGING:  A lumbar MRI performed on 05/24/2024 demonstrates multilevel lumbar degenerative disc disease. The most significant findings include spinal stenosis at L3-L4 and L4-L5, which is multifactorial. Multilevel facet arthropathy is noted bilaterally. There is no significant foraminal stenosis observed.   ASSESSMENT:  - Spinal stenosis, L3-L4, L4-L5, multifactorial.  - Multilevel lumbar degenerative disc disease.  - Multilevel facet arthropathy, bilateral.  - Radicular symptoms, left lower limb.   PLAN:  The patient is complaining of radicular symptoms in the left lower limb, more so than the right. Spinal stenosis at L3-L4 and L4-L5 could be contributing to his symptoms. I discussed with the patient that he is still within the timeframe where his condition could improve, as 80% of back pain episodes resolve within six to eight weeks. However, given his significant pain and functional limitations, I recommend that he consult a spine surgeon for further evaluation.   I am referring the patient to Dr. Duwayne for surgical consultation. The patient is currently using Celebrex, which is not providing significant relief. I have prescribed Tramadol , 100 mg, to be taken three times daily  as needed for pain management. If the patient feels that Celebrex is ineffective, I would recommend discontinuing it.   [TY]    Clinical Course User Index [TY] Neysa Caron PARAS, DO   {Click here for ABCD2, HEART and other calculators REFRESH Note before signing:1}                              Medical Decision Making This is a 63 year old male presenting to the emergency department for back pain.  Afebrile vital signs reassuring.  No red flags.  No weakness in lower extremities, saddle anesthesia, fevers, unexplained weight loss, does have some subjective paresthesia in L5 dermatome, being seen outpatient by [facility] has had MRIs.  See ED course.  Treated with multimodal pain medication with some improvement of symptoms.    Amount and/or Complexity of Data Reviewed Labs:     Details: No elevation in creatinine.  Mild leukocytosis which respected secondary to steroid use.  No transaminitis to suggest hepatobiliary disease.  Lipase normal.  pancreatitis unlikely. Radiology:     Details: Consider repeat imaging, but patient with recent MRI with some degenerative joint disease and spinal stenosis.  No evidence of cauda equina on MRI, pain is largely unchanged.  Risk OTC drugs. Prescription drug management. Decision regarding hospitalization. Diagnosis or treatment significantly limited by social determinants of health.   {Document critical care time when appropriate  Document review of labs and clinical decision tools ie CHADS2VASC2, etc  Document your independent review of radiology images and any outside records  Document your discussion with family members, caretakers and with consultants  Document social determinants of health affecting pt's care  Document your decision making why or why not admission, treatments were needed:32947:::1}   Final diagnoses:  Acute left-sided low back pain with left-sided sciatica    ED Discharge Orders          Ordered    lidocaine  (LIDODERM ) 5 %   Every 24 hours        05/27/24 2347    oxyCODONE -acetaminophen  (PERCOCET/ROXICET) 5-325 MG tablet  Every 6 hours PRN        05/27/24 2347

## 2024-05-27 NOTE — ED Triage Notes (Signed)
 BIBA per EMS: from home W/ c/o lower left back x 2 weeks. Progressively getting worse.  650 tylenol  given en route.  [facility]

## 2024-05-27 NOTE — Discharge Instructions (Signed)
 Please take Tylenol  alternating with meloxicam  for baseline pain control.  We are prescribing you lidocaine  patches as well.  You can continue to take your muscle relaxers  and gabapentin at home.  I am prescribing you a short course of narcotic pain medication for breakthrough pain.  Do not take tramadol  if you take Percocet.  Return if you develop fevers, chills, severe pain, weakness in your legs, numbness in your genital area, bowel or bladder incontinence or any worsening symptoms that are concerning to you.

## 2024-05-29 ENCOUNTER — Telehealth: Payer: Self-pay

## 2024-05-29 NOTE — Transitions of Care (Post Inpatient/ED Visit) (Signed)
 05/29/2024  Name: Jonathan [name] MRN: [id number] DOB: [birth year]  Today's TOC FU Call Status: Today's TOC FU Call Status:: Successful TOC FU Call Completed TOC FU Call Complete Date: 05/29/24 Patient's Name and Date of Birth confirmed.  Transition Care Management Follow-up Telephone Call Date of Discharge: 05/27/24 Discharge Facility: [facility]) Type of Discharge: Emergency Department Reason for ED Visit: Other: How have you been since you were released from the hospital?: Same Any questions or concerns?: No  Items Reviewed: Did you receive and understand the discharge instructions provided?: Yes Medications obtained,verified, and reconciled?: Yes (Medications Reviewed) Any new allergies since your discharge?: Yes Dietary orders reviewed?: NA Do you have support at home?: Yes People in Home [RPT]: spouse Name of Support/Comfort Primary Source: diane  Medications Reviewed Today: Medications Reviewed Today     Reviewed by Elner Shan NOVAK, CMA (Certified Medical Assistant) on 05/29/24 at 1428  Med List Status: <None>   Medication Order Taking? Sig Documenting Provider Last Dose Status Informant  ALPRAZolam  (XANAX ) 0.5 MG tablet [id number] Yes Take 1/2-1 tab as needed every 4-6 hours as needed for severe anxiety and panic  Patient taking differently: Take 0.25-0.5 mg by mouth See admin instructions. Take 0.25-0.5 mg by mouth every 4-6 hours as needed for severe anxiety and panic   Franchot Harlene SQUIBB, PMHNP  Active Self  azelastine  (OPTIVAR ) 0.05 % ophthalmic solution [id number]  Place 1 drop into the left eye 2 (two) times daily as needed (eye redness, itching, irritation).  Patient not taking: Reported on 05/29/2024   Mayer, Jodi R, NP  Active Self  buPROPion  (WELLBUTRIN  SR) 150 MG 12 hr tablet [id number]  Take 1 tablet (150 mg total) by mouth daily in the afternoon.  Patient not taking: Reported on 05/19/2024   Mozingo, Regina Nattalie, NP  Active Self  buPROPion   (WELLBUTRIN  XL) 150 MG 24 hr tablet [id number]  Take 1 tablet (150 mg total) by mouth daily.  Patient not taking: Reported on 05/29/2024   Mozingo, Regina Nattalie, NP  Active Self  buPROPion  ER (WELLBUTRIN  SR) 100 MG 12 hr tablet [id number] Yes Take 100 mg by mouth daily in the afternoon. [provider]  Active Self  fluticasone  (FLONASE ) 50 MCG/ACT nasal spray [id number]  Place 1 spray into both nostrils daily. Begin by using 2 sprays in each nare daily for 3 to 5 days, then decrease to 1 spray in each nare daily.  Patient not taking: Reported on 05/29/2024   Joesph Shaver Scales, PA-C  Active Self  gabapentin (NEURONTIN) 100 MG capsule [id number] Yes Take 100 mg by mouth in the morning, at noon, and at bedtime. [provider]  Active Self  lidocaine  (LIDODERM ) 5 % [id number] Yes Place 1 patch onto the skin daily. Remove & Discard patch within 12 hours or as directed by MD Neysa Caron PARAS, DO  Active   losartan -hydrochlorothiazide  (HYZAAR) 50-12.5 MG tablet [id number] Yes Take 2 tablets by mouth daily. Webb, Padonda B, FNP  Active Self  meloxicam  (MOBIC ) 7.5 MG tablet [id number] Yes Take 7.5 mg by mouth daily. [provider]  Active Self  metFORMIN  (GLUCOPHAGE -XR) 500 MG 24 hr tablet [id number] Yes TAKE 3 TABLETS DAILY  Patient taking differently: Take 500 mg by mouth 3 (three) times daily with meals.   Panosh, Apolinar POUR, MD  Active Self  methocarbamol (ROBAXIN) 500 MG tablet [id number] Yes Take 500 mg by mouth in the morning and at bedtime. [provider]  Active Self  ondansetron  (ZOFRAN -ODT) 4  MG disintegrating tablet [id number] Yes Take 1 tablet (4 mg total) by mouth every 8 (eight) hours as needed. Waddell Sluder, PA-C  Active   oxyCODONE -acetaminophen  (PERCOCET/ROXICET) 5-325 MG tablet [id number] Yes Take 1 tablet by mouth every 6 (six) hours as needed for severe pain (pain score 7-10). Neysa Caron PARAS, DO  Active   rosuvastatin  (CRESTOR ) 5 MG tablet [id number] Yes  TAKE 5 MG BY MOUTH 5 DAYS PER WEEK FOR 2 WEEKS AND THEN INCREASE TO 5 MG 7 DAYS PER WEEK AS TOLERATED  Patient taking differently: Take 5 mg by mouth in the morning.   Panosh, Apolinar POUR, MD  Active Self  tamsulosin  (FLOMAX ) 0.4 MG CAPS capsule [id number] Yes Take 0.4 mg by mouth daily. [provider]  Active Self  Vilazodone  HCl 20 MG TABS [id number] Yes Take 1 tablet (20 mg total) by mouth daily with breakfast AND 0.5 tablets (10 mg total) daily with supper.  Patient taking differently: Take 20 mg by mouth with breakfast and 10 mg with supper/evening meal   Mozingo, Regina Nattalie, NP  Active Self            Home Care and Equipment/Supplies: Were Home Health Services Ordered?: No Any new equipment or medical supplies ordered?: No  Functional Questionnaire: Do you need assistance with bathing/showering or dressing?: No Do you need assistance with meal preparation?: No Do you need assistance with eating?: No Do you have difficulty maintaining continence: No Do you need assistance with getting out of bed/getting out of a chair/moving?: No Do you have difficulty managing or taking your medications?: No  Follow up appointments reviewed: PCP Follow-up appointment confirmed?: Yes Date of PCP follow-up appointment?: 06/20/24 Follow-up Provider: [facility] Follow-up appointment confirmed?: NA Do you need transportation to your follow-up appointment?: No Do you understand care options if your condition(s) worsen?: Yes-patient verbalized understanding    SIGNATURE Stonewall Doss, cma

## 2024-05-29 NOTE — Telephone Encounter (Signed)
Called pt with follow up

## 2024-05-30 ENCOUNTER — Ambulatory Visit (INDEPENDENT_AMBULATORY_CARE_PROVIDER_SITE_OTHER): Admitting: Psychology

## 2024-05-30 DIAGNOSIS — F4323 Adjustment disorder with mixed anxiety and depressed mood: Secondary | ICD-10-CM | POA: Diagnosis not present

## 2024-05-30 DIAGNOSIS — F902 Attention-deficit hyperactivity disorder, combined type: Secondary | ICD-10-CM

## 2024-05-30 DIAGNOSIS — F3342 Major depressive disorder, recurrent, in full remission: Secondary | ICD-10-CM | POA: Diagnosis not present

## 2024-05-30 NOTE — Progress Notes (Addendum)
 [facility] Counselor/Therapist Progress Note  Patient ID: Jonathan [name], MRN: [id number],    Date:05/30/2024  Time Spent: 51 minutes  Time in:  8:07  Time out: 8:58  Treatment Type: Individual Therapy  Reported Symptoms: sadness, inability to pay attention  Mental Status Exam: Appearance:  Casual     Behavior: Appropriate  Motor: Normal  Speech/Language:  Normal Rate  Affect: Appropriate  Mood: anxious  Thought process: normal  Thought content:   WNL  Sensory/Perceptual disturbances:   WNL  Orientation: oriented to person, place, time/date, and situation  Attention: Good  Concentration: Good  Memory: WNL  Fund of knowledge:  Good  Insight:   Good  Judgment:  Good  Impulse Control: Good   Risk Assessment: Danger to Self:  No Self-injurious Behavior: No Danger to Others: No Duty to Warn:no Physical Aggression / Violence:No  Access to Firearms a concern: No  Gang Involvement:No   Subjective: The patient attended a face-to-face individual therapy session via video visit today.  The patient gave verbal consent for the session to be on caregility and he is aware of the limitations of telehealth.  The patient was in his home alone and the therapist was in the office.  The patient requested that the session to be on video today because he is having severe back pain.  He went through the story of what is happening to his back and he has been to the emergency room 2 times with back pain in the last few weeks.  We talked about him dealing with this issue and we also talked about his potential retirement in October of this year.  He was planning to retire anyway but now he has the situation with his back and we talked about the possibility to reframe his thinking around being able to detach and delegate and that this is the perfect opportunity to do that.  In addition we talked about the possibility that he could get treatment with a spinal cord stimulator or something  that is less invasive than surgery at  some point but he is going to do to different doctors next week.  The patient seemed appreciative of reframing those thoughts and of more having more information to help manage his pain until he has treatment.  We also talked about emotional Freedom therapy for chronic pain and I recommended a book for him.  Interventions: Cognitive Behavioral Therapy and Insight-Oriented  Diagnosis:Recurrent major depressive disorder, in full remission (HCC)  Attention deficit hyperactivity disorder (ADHD), combined type  Adjustment disorder with mixed anxiety and depressed mood  Plan: Client Abilities/Strengths  Intelligent, insightful, motivated  Client Treatment Preferences  Outpatient individual therapy  Client Statement of Needs  I decided I needed therapy to help me sort through a situation I am dealing with  Treatment Level  Outpatient Individual therapy  Symptoms  Increased defensiveness with his wife: (Status: improved). Passive -  aggressive behaviors: (Status: improved).  Problems Addressed  adjustment disorder, Vocational Stress, Vocational Stress, Vocational Stress, Vocational Stress,  Vocational Stress  Goals 1. Decrease incidents of exhibiting passive aggressive behavior Objective Learn and implement problem-solving skills. Target Date: 2024-09-15 Frequency: Monthly Progress: 90 Modality: individual Related Interventions 1. Conduct Problem-Solving Therapy (see Problem-Solving Therapy by Francisco and Nezu) using techniques such as psychoeducation, modeling, and role-playing to teach the client problem solving skills (i.e., defining a problem specifically, generating possible solutions, evaluating the  pros and cons of each solution, selecting and implementing a plan of action, evaluating the  efficacy of the plan, accepting or revising the plan); role-play application of the problemsolving skill to a real life issue (or assign Applying  Problem-Solving to Interpersonal Conflict in the Adult Psychotherapy Homework Planner by Jenniffer). 2. Improve satisfaction and comfort surrounding coworker relationships. 3. Increase job satisfaction and performance due to implementation of  assertiveness and stress management strategies. 4. Increase sense of confidence and competence in dealing with work  responsibilities. Objective Verbalize healthy, realistic cognitive messages that promote harmony with others, self-acceptance, and  self-confidence. Target Date: 2024-09-15 Frequency: Monthly Progress: 90 Modality: individual 5. Increase sense of self-esteem and elevation of mood in spite of  unemployment. 6. Pursue employment consistency with a reasonably hopeful and positive attitude. 7.  Deal with lifes issues by gaining wisdom and knowledge and implementing wisdom gained.  Diagnosis Adult ADD 296.31 (Major depressive affective disorder, recurrent episode in full remission) - 309.28 (Adjustment disorder with mixed anxiety and depressed mood) - Conditions For Discharge Achievement of treatment goals and objectives  The patient approved this plan and is making progress.  Aubree Doody G Novalyn Lajara, LCSW

## 2024-06-03 DIAGNOSIS — M5416 Radiculopathy, lumbar region: Secondary | ICD-10-CM | POA: Diagnosis not present

## 2024-06-03 DIAGNOSIS — Z6829 Body mass index (BMI) 29.0-29.9, adult: Secondary | ICD-10-CM | POA: Diagnosis not present

## 2024-06-06 ENCOUNTER — Other Ambulatory Visit: Payer: Self-pay | Admitting: Neurosurgery

## 2024-06-06 DIAGNOSIS — M5416 Radiculopathy, lumbar region: Secondary | ICD-10-CM

## 2024-06-16 ENCOUNTER — Other Ambulatory Visit: Payer: Self-pay | Admitting: Internal Medicine

## 2024-06-18 ENCOUNTER — Ambulatory Visit (INDEPENDENT_AMBULATORY_CARE_PROVIDER_SITE_OTHER): Admitting: Psychology

## 2024-06-18 DIAGNOSIS — F902 Attention-deficit hyperactivity disorder, combined type: Secondary | ICD-10-CM

## 2024-06-18 DIAGNOSIS — F4323 Adjustment disorder with mixed anxiety and depressed mood: Secondary | ICD-10-CM

## 2024-06-18 DIAGNOSIS — F3342 Major depressive disorder, recurrent, in full remission: Secondary | ICD-10-CM | POA: Diagnosis not present

## 2024-06-18 NOTE — Progress Notes (Unsigned)
 [facility] Counselor/Therapist Progress Note  Patient ID: Jonathan [name], MRN: [id number],    Date:06/18/2024  Time Spent: 58 minutes  Time in:  5:01  Time out: 5:59  Treatment Type: Individual Therapy  Reported Symptoms: sadness, inability to pay attention  Mental Status Exam: Appearance:  Casual     Behavior: Appropriate  Motor: Normal  Speech/Language:  Normal Rate  Affect: Appropriate  Mood: pleasant  Thought process: normal  Thought content:   WNL  Sensory/Perceptual disturbances:   WNL  Orientation: oriented to person, place, time/date, and situation  Attention: Good  Concentration: Good  Memory: WNL  Fund of knowledge:  Good  Insight:   Good  Judgment:  Good  Impulse Control: Good   Risk Assessment: Danger to Self:  No Self-injurious Behavior: No Danger to Others: No Duty to Warn:no Physical Aggression / Violence:No  Access to Firearms a concern: No  Gang Involvement:No   Subjective: The patient attended a face-to-face individual therapy session via video visit today.  The patient gave verbal consent for the session to be on caregility and he is aware of the limitations of telehealth.  The patient was in his home alone and the therapist was in the office.  The patient presents as pleasant and cooperative.  He reports he is still having back pain issues but he seems to be navigating that better.  He did report that he is out on short-term disability right now and that he feels he needs to do this because of his pain and also the treatment protocols that he is getting ready to go through.  He reports that he is going to have an epidural later this month.  He is still considering retiring October 1 and we talked about how that process is going.  We discussed the possibility of him again going to a financial advisor with his wife to see what options they have as they were talking about taking Social Security and waiting to take social security.  I did  explain to him that they could help him figure out when to do that and how that would be most effective. Interventions: Cognitive Behavioral Therapy and Insight-Oriented  Diagnosis:Recurrent major depressive disorder, in full remission (HCC)  Attention deficit hyperactivity disorder (ADHD), combined type  Adjustment disorder with mixed anxiety and depressed mood  Plan: Client Abilities/Strengths  Intelligent, insightful, motivated  Client Treatment Preferences  Outpatient individual therapy  Client Statement of Needs  I decided I needed therapy to help me sort through a situation I am dealing with  Treatment Level  Outpatient Individual therapy  Symptoms  Increased defensiveness with his wife: (Status: improved). Passive -  aggressive behaviors: (Status: improved).  Problems Addressed  adjustment disorder, Vocational Stress, Vocational Stress, Vocational Stress, Vocational Stress,  Vocational Stress  Goals 1. Decrease incidents of exhibiting passive aggressive behavior Objective Learn and implement problem-solving skills. Target Date: 2024-09-15 Frequency: Monthly Progress: 90 Modality: individual Related Interventions 1. Conduct Problem-Solving Therapy (see Problem-Solving Therapy by Francisco and Nezu) using techniques such as psychoeducation, modeling, and role-playing to teach the client problem solving skills (i.e., defining a problem specifically, generating possible solutions, evaluating the  pros and cons of each solution, selecting and implementing a plan of action, evaluating the  efficacy of the plan, accepting or revising the plan); role-play application of the problemsolving skill to a real life issue (or assign Applying Problem-Solving to Interpersonal Conflict in the Adult Psychotherapy Homework Planner by Jenniffer). 2. Improve satisfaction and comfort surrounding coworker  relationships. 3. Increase job satisfaction and performance due to implementation of   assertiveness and stress management strategies. 4. Increase sense of confidence and competence in dealing with work  responsibilities. Objective Verbalize healthy, realistic cognitive messages that promote harmony with others, self-acceptance, and  self-confidence. Target Date: 2024-09-15 Frequency: Monthly Progress: 90 Modality: individual 5. Increase sense of self-esteem and elevation of mood in spite of  unemployment. 6. Pursue employment consistency with a reasonably hopeful and positive attitude. 7.  Deal with lifes issues by gaining wisdom and knowledge and implementing wisdom gained.  Diagnosis Adult ADD 296.31 (Major depressive affective disorder, recurrent episode in full remission) - 309.28 (Adjustment disorder with mixed anxiety and depressed mood) - Conditions For Discharge Achievement of treatment goals and objectives  The patient approved this plan and is making progress.  Jerrine Urschel G Melaina Howerton, LCSW

## 2024-06-19 NOTE — Progress Notes (Unsigned)
 No chief complaint on file.   HPI: Jonathan [name] 63 y.o. come in for  fu hosp  Had ed visit 6 15 for flank pain  Saw dr Arnaldo and Bonner for back fu  Ed visit 6 23 for actue left sided back pain with  sciatica  Has seen dr Debby  since then  NS and spine  specialist  on 7 16   I sunder therapy witfor some deression  in remission  Last visit with me 4 24  last pv  2 24  ROS: See pertinent positives and negatives per HPI.  Past Medical History:  Diagnosis Date   Allergic rhinitis    Allergy    Anxiety    Depression    Diabetes mellitus without complication (HCC)    type 2   Heart murmur    per pt told it was functional   History of kidney stones    History of nephrolithiasis    Hyperlipidemia    Hypertension    Sebaceous cyst     Family History  Problem Relation Age of Onset   Diabetes Father    Hypertension Father    Kidney cancer Father        now on dialysis   Kidney Stones Father    Kidney Stones Brother    Huntington's disease Maternal Grandfather    Depression Daughter    Anxiety disorder Daughter    ADD / ADHD Daughter    Colon polyps Daughter 65   Colon cancer Neg Hx    Esophageal cancer Neg Hx    Stomach cancer Neg Hx    Rectal cancer Neg Hx     Social History   Socioeconomic History   Marital status: Married    Spouse name: Not on file   Number of children: 1   Years of education: 18   Highest education level: Not on file  Occupational History   Occupation: MINISTER    Employer: [employer]  Tobacco Use   Smoking status: Never   Smokeless tobacco: Never  Vaping Use   Vaping status: Never Used  Substance and Sexual Activity   Alcohol use: Yes    Comment: less than 1 a week   Drug use: No   Sexual activity: Yes  Other Topics Concern   Not on file  Social History Narrative   Married   Regular exercise- no   HH of 3   Dog car and lizard   Sleep wakening at times   New job [employer]   Per week reg sleep.     Social Drivers of Corporate investment banker Strain: Not on file  Food Insecurity: No Food Insecurity (08/12/2021)   Received from [facility]   Hunger Vital Sign    Within the past 12 months, you worried that your food would run out before you got the money to buy more.: Never true    Within the past 12 months, the food you bought just didn't last and you didn't have money to get more.: Never true  Transportation Needs: Not on file  Physical Activity: Not on file  Stress: Not on file  Social Connections: Unknown (04/19/2022)   Received from [facility]   Social Network    Social Network: Not on file    Outpatient Medications Prior to Visit  Medication Sig Dispense Refill   ALPRAZolam  (XANAX ) 0.5 MG tablet Take 1/2-1 tab as needed every 4-6 hours as needed for severe anxiety  and panic (Patient taking differently: Take 0.25-0.5 mg by mouth See admin instructions. Take 0.25-0.5 mg by mouth every 4-6 hours as needed for severe anxiety and panic) 30 tablet 0   azelastine  (OPTIVAR ) 0.05 % ophthalmic solution Place 1 drop into the left eye 2 (two) times daily as needed (eye redness, itching, irritation). (Patient not taking: Reported on 05/29/2024) 6 mL 0   buPROPion  (WELLBUTRIN  SR) 150 MG 12 hr tablet Take 1 tablet (150 mg total) by mouth daily in the afternoon. (Patient not taking: Reported on 05/19/2024) 90 tablet 1   buPROPion  (WELLBUTRIN  XL) 150 MG 24 hr tablet Take 1 tablet (150 mg total) by mouth daily. (Patient not taking: Reported on 05/29/2024) 90 tablet 1   buPROPion  ER (WELLBUTRIN  SR) 100 MG 12 hr tablet Take 100 mg by mouth daily in the afternoon.     fluticasone  (FLONASE ) 50 MCG/ACT nasal spray Place 1 spray into both nostrils daily. Begin by using 2 sprays in each nare daily for 3 to 5 days, then decrease to 1 spray in each nare daily. (Patient not taking: Reported on 05/29/2024) 32 mL 1   gabapentin  (NEURONTIN ) 100 MG capsule Take 100 mg by mouth in the morning, at noon, and at  bedtime.     lidocaine  (LIDODERM ) 5 % Place 1 patch onto the skin daily. Remove & Discard patch within 12 hours or as directed by MD 30 patch 0   losartan -hydrochlorothiazide  (HYZAAR) 50-12.5 MG tablet Take 2 tablets by mouth daily. 180 tablet 3   meloxicam  (MOBIC ) 7.5 MG tablet Take 7.5 mg by mouth daily.     metFORMIN  (GLUCOPHAGE -XR) 500 MG 24 hr tablet Take 3 tablets (1,500 mg total) by mouth daily. 90 tablet 0   methocarbamol (ROBAXIN) 500 MG tablet Take 500 mg by mouth in the morning and at bedtime.     oxyCODONE -acetaminophen  (PERCOCET/ROXICET) 5-325 MG tablet Take 1 tablet by mouth every 6 (six) hours as needed for severe pain (pain score 7-10). 10 tablet 0   rosuvastatin  (CRESTOR ) 5 MG tablet TAKE 5 MG BY MOUTH 5 DAYS PER WEEK FOR 2 WEEKS AND THEN INCREASE TO 5 MG 7 DAYS PER WEEK AS TOLERATED (Patient taking differently: Take 5 mg by mouth in the morning.) 60 tablet 5   tamsulosin  (FLOMAX ) 0.4 MG CAPS capsule Take 0.4 mg by mouth daily.     Vilazodone  HCl 20 MG TABS Take 1 tablet (20 mg total) by mouth daily with breakfast AND 0.5 tablets (10 mg total) daily with supper. (Patient taking differently: Take 20 mg by mouth with breakfast and 10 mg with supper/evening meal) 135 tablet 1   No facility-administered medications prior to visit.     EXAM:  There were no vitals taken for this visit.  There is no height or weight on file to calculate BMI.  GENERAL: vitals reviewed and listed above, alert, oriented, appears well hydrated and in no acute distress HEENT: atraumatic, conjunctiva  clear, no obvious abnormalities on inspection of external nose and ears OP : no lesion edema or exudate  NECK: no obvious masses on inspection palpation  LUNGS: clear to auscultation bilaterally, no wheezes, rales or rhonchi, good air movement CV: HRRR, no clubbing cyanosis or  peripheral edema nl cap refill  MS: moves all extremities without noticeable focal  abnormality PSYCH: pleasant and cooperative,  no obvious depression or anxiety Lab Results  Component Value Date   WBC 12.6 (H) 05/27/2024   HGB 18.4 (H) 05/27/2024   HCT 53.4 (H)  05/27/2024   PLT 314 05/27/2024   GLUCOSE 153 (H) 05/27/2024   CHOL 132 01/23/2023   TRIG 87.0 01/23/2023   HDL 48.70 01/23/2023   LDLDIRECT 209.0 03/30/2022   LDLCALC 66 01/23/2023   ALT 21 05/27/2024   AST 28 05/27/2024   NA 133 (L) 05/27/2024   K 3.5 05/27/2024   CL 97 (L) 05/27/2024   CREATININE 1.48 (H) 05/27/2024   BUN 24 (H) 05/27/2024   CO2 24 05/27/2024   TSH 1.56 01/23/2023   PSA 0.84 01/23/2023   HGBA1C 6.4 01/23/2023   MICROALBUR 0.8 10/19/2020   BP Readings from Last 3 Encounters:  05/27/24 (!) 147/94  05/19/24 (!) 144/81  03/13/24 129/80    ASSESSMENT AND PLAN:  Discussed the following assessment and plan:  No diagnosis found. Lumbar radiculopathy  under care  Hospt lab review   update  Plan fasting  lab   -Patient advised to return or notify health care team  if  new concerns arise.  There are no Patient Instructions on file for this visit.   Nejla Reasor K. Jazzma Neidhardt M.D.

## 2024-06-20 ENCOUNTER — Encounter: Payer: Self-pay | Admitting: Internal Medicine

## 2024-06-20 ENCOUNTER — Ambulatory Visit: Admitting: Internal Medicine

## 2024-06-20 ENCOUNTER — Other Ambulatory Visit: Payer: Self-pay | Admitting: Internal Medicine

## 2024-06-20 VITALS — BP 124/74 | HR 82 | Temp 98.0°F | Ht 66.0 in | Wt 194.2 lb

## 2024-06-20 DIAGNOSIS — Z79899 Other long term (current) drug therapy: Secondary | ICD-10-CM | POA: Diagnosis not present

## 2024-06-20 DIAGNOSIS — Z0000 Encounter for general adult medical examination without abnormal findings: Secondary | ICD-10-CM

## 2024-06-20 DIAGNOSIS — I10 Essential (primary) hypertension: Secondary | ICD-10-CM

## 2024-06-20 DIAGNOSIS — E785 Hyperlipidemia, unspecified: Secondary | ICD-10-CM | POA: Diagnosis not present

## 2024-06-20 DIAGNOSIS — E119 Type 2 diabetes mellitus without complications: Secondary | ICD-10-CM

## 2024-06-20 DIAGNOSIS — M5416 Radiculopathy, lumbar region: Secondary | ICD-10-CM

## 2024-06-20 DIAGNOSIS — Z125 Encounter for screening for malignant neoplasm of prostate: Secondary | ICD-10-CM

## 2024-06-20 LAB — POCT GLYCOSYLATED HEMOGLOBIN (HGB A1C): Hemoglobin A1C: 6.2 % — AB (ref 4.0–5.6)

## 2024-06-20 NOTE — Progress Notes (Signed)
 Future orders

## 2024-06-20 NOTE — Patient Instructions (Addendum)
 Plan fasting lab  I will place orders .   Then visit in about a month or as indicated we can do as preventive if all ok .   A1c is 6.2 today !

## 2024-06-27 ENCOUNTER — Other Ambulatory Visit

## 2024-07-01 DIAGNOSIS — M5416 Radiculopathy, lumbar region: Secondary | ICD-10-CM | POA: Diagnosis not present

## 2024-07-09 ENCOUNTER — Other Ambulatory Visit (INDEPENDENT_AMBULATORY_CARE_PROVIDER_SITE_OTHER)

## 2024-07-09 ENCOUNTER — Ambulatory Visit: Payer: Self-pay | Admitting: Internal Medicine

## 2024-07-09 DIAGNOSIS — E119 Type 2 diabetes mellitus without complications: Secondary | ICD-10-CM | POA: Diagnosis not present

## 2024-07-09 DIAGNOSIS — I10 Essential (primary) hypertension: Secondary | ICD-10-CM | POA: Diagnosis not present

## 2024-07-09 DIAGNOSIS — E785 Hyperlipidemia, unspecified: Secondary | ICD-10-CM

## 2024-07-09 DIAGNOSIS — Z125 Encounter for screening for malignant neoplasm of prostate: Secondary | ICD-10-CM

## 2024-07-09 DIAGNOSIS — Z79899 Other long term (current) drug therapy: Secondary | ICD-10-CM | POA: Diagnosis not present

## 2024-07-09 DIAGNOSIS — Z0000 Encounter for general adult medical examination without abnormal findings: Secondary | ICD-10-CM | POA: Diagnosis not present

## 2024-07-09 LAB — LIPID PANEL
Cholesterol: 157 mg/dL (ref 0–200)
HDL: 54.8 mg/dL (ref >39.00)
LDL Cholesterol: 85 mg/dL (ref 0–99)
NonHDL: 102.14
Total CHOL/HDL Ratio: 3
Triglycerides: 88 mg/dL (ref 0.0–149.0)
VLDL: 17.6 mg/dL (ref 0.0–40.0)

## 2024-07-09 LAB — CBC WITH DIFFERENTIAL/PLATELET
Basophils Absolute: 0.1 K/uL (ref 0.0–0.1)
Basophils Relative: 0.6 % (ref 0.0–3.0)
Eosinophils Absolute: 1.1 K/uL — ABNORMAL HIGH (ref 0.0–0.7)
Eosinophils Relative: 10.7 % — ABNORMAL HIGH (ref 0.0–5.0)
HCT: 47.3 % (ref 39.0–52.0)
Hemoglobin: 16 g/dL (ref 13.0–17.0)
Lymphocytes Relative: 22.9 % (ref 12.0–46.0)
Lymphs Abs: 2.3 K/uL (ref 0.7–4.0)
MCHC: 33.9 g/dL (ref 30.0–36.0)
MCV: 91.8 fl (ref 78.0–100.0)
Monocytes Absolute: 1.1 K/uL — ABNORMAL HIGH (ref 0.1–1.0)
Monocytes Relative: 11.1 % (ref 3.0–12.0)
Neutro Abs: 5.4 K/uL (ref 1.4–7.7)
Neutrophils Relative %: 54.7 % (ref 43.0–77.0)
Platelets: 243 K/uL (ref 150.0–400.0)
RBC: 5.15 Mil/uL (ref 4.22–5.81)
RDW: 13.4 % (ref 11.5–15.5)
WBC: 9.8 K/uL (ref 4.0–10.5)

## 2024-07-09 LAB — HEPATIC FUNCTION PANEL
ALT: 21 U/L (ref 0–53)
AST: 15 U/L (ref 0–37)
Albumin: 4.1 g/dL (ref 3.5–5.2)
Alkaline Phosphatase: 59 U/L (ref 39–117)
Bilirubin, Direct: 0.1 mg/dL (ref 0.0–0.3)
Total Bilirubin: 0.5 mg/dL (ref 0.2–1.2)
Total Protein: 6.1 g/dL (ref 6.0–8.3)

## 2024-07-09 LAB — TSH: TSH: 1.22 u[IU]/mL (ref 0.35–5.50)

## 2024-07-09 LAB — BASIC METABOLIC PANEL WITH GFR
BUN: 14 mg/dL (ref 6–23)
CO2: 27 meq/L (ref 19–32)
Calcium: 9 mg/dL (ref 8.4–10.5)
Chloride: 107 meq/L (ref 96–112)
Creatinine, Ser: 1.18 mg/dL (ref 0.40–1.50)
GFR: 65.04 mL/min (ref >60.00)
Glucose, Bld: 119 mg/dL — ABNORMAL HIGH (ref 70–99)
Potassium: 4 meq/L (ref 3.5–5.1)
Sodium: 142 meq/L (ref 135–145)

## 2024-07-09 LAB — MICROALBUMIN / CREATININE URINE RATIO
Creatinine,U: 102.8 mg/dL
Microalb Creat Ratio: UNDETERMINED mg/g (ref 0.0–30.0)
Microalb, Ur: <0.7 mg/dL

## 2024-07-09 LAB — PSA: PSA: 0.92 ng/mL (ref 0.10–4.00)

## 2024-07-09 LAB — VITAMIN B12: Vitamin B-12: 214 pg/mL (ref 211–911)

## 2024-07-09 NOTE — Progress Notes (Signed)
 Will review at upcoming visit  labs ok b12 lower end of reference range

## 2024-07-16 ENCOUNTER — Other Ambulatory Visit: Payer: Self-pay | Admitting: Internal Medicine

## 2024-07-23 ENCOUNTER — Encounter: Payer: Self-pay | Admitting: Internal Medicine

## 2024-07-23 ENCOUNTER — Ambulatory Visit: Admitting: Internal Medicine

## 2024-07-23 VITALS — BP 130/78 | HR 80 | Temp 97.8°F | Ht 66.0 in | Wt 198.2 lb

## 2024-07-23 DIAGNOSIS — E119 Type 2 diabetes mellitus without complications: Secondary | ICD-10-CM | POA: Diagnosis not present

## 2024-07-23 DIAGNOSIS — Z79899 Other long term (current) drug therapy: Secondary | ICD-10-CM | POA: Diagnosis not present

## 2024-07-23 DIAGNOSIS — E538 Deficiency of other specified B group vitamins: Secondary | ICD-10-CM | POA: Diagnosis not present

## 2024-07-23 DIAGNOSIS — E785 Hyperlipidemia, unspecified: Secondary | ICD-10-CM

## 2024-07-23 DIAGNOSIS — I10 Essential (primary) hypertension: Secondary | ICD-10-CM | POA: Diagnosis not present

## 2024-07-23 DIAGNOSIS — M5416 Radiculopathy, lumbar region: Secondary | ICD-10-CM

## 2024-07-23 MED ORDER — LOSARTAN POTASSIUM-HCTZ 50-12.5 MG PO TABS: 1.0000 | ORAL_TABLET | Freq: Every day | ORAL | 3 refills | Status: DC

## 2024-07-23 NOTE — Patient Instructions (Addendum)
 Decrease  the losartan  hctz to 1 per day .  Send in message about readings after  a month . We can then decide if further action is required..  Add  b complexa  vits     ( that  has vit b12 in it  . )  Plan  :  January visit   and  A1c  at the visit . Continue lifestyle intervention healthy eating and exercise .

## 2024-07-23 NOTE — Progress Notes (Signed)
 Chief Complaint  Patient presents with   Medical Management of Chronic Issues    Pt states he is here to go over lab and discuss on losartan . Pt reports he didn't take his BP medication today.     HPI: Patient  Jonathan [name]  63 y.o. comes in today for  fu labs and monitoring   Back  pressure  left lef feels a bit better . Walki. ng w cane but pretty independent  Decided to retire at end of year .   Bp readings log here today  many 110 15o rnage  and intermittently holds the medication because of low readings but no sx of such .   No neuropathy other wise   Health Maintenance  Topic Date Due   HIV Screening  Never done   Pneumococcal Vaccine: 50+ Years (1 of 2 - PCV) Never done   Zoster Vaccines- Shingrix (1 of 2) Never done   DTaP/Tdap/Td (2 - Tdap) 03/31/2020   FOOT EXAM  10/19/2021   OPHTHALMOLOGY EXAM  06/11/2022   COVID-19 Vaccine (4 - 2024-25 season) 08/06/2023   INFLUENZA VACCINE  07/05/2024   HEMOGLOBIN A1C  12/21/2024   Diabetic kidney evaluation - eGFR measurement  07/09/2025   Diabetic kidney evaluation - Urine ACR  07/09/2025   Colonoscopy  01/29/2027   Hepatitis C Screening  Completed   Hepatitis B Vaccines 19-59 Average Risk  Aged Out   HPV VACCINES  Aged Out   Meningococcal B Vaccine  Aged Out    ROS:  GEN/ HEENT: No fever, significant weight changes sweats headaches vision problems hearing changes, CV/ PULM; No chest pain shortness of breath  syncope,edema  change in exercise tolerance. GI /GU: No adominal pain, vomiting, change in bowel habits. No blood in the stool. No significant GU symptoms. SKIN/HEME: ,no acute skin rashes suspicious lesions or bleeding. No lymphadenopathy, nodules, masses.  NEURO/ PSYCH:  No neurologic signs such as weakness numbness. No depression anxiety. IMM/ Allergy: No unusual infections.  Allergy .   REST of 12 system review negative except as per HPI   Past Medical History:  Diagnosis Date   Allergic rhinitis     Allergy    Anxiety    Depression    Diabetes mellitus without complication (HCC)    type 2   Heart murmur    per pt told it was functional   History of kidney stones    History of nephrolithiasis    Hyperlipidemia    Hypertension    Sebaceous cyst     Past Surgical History:  Procedure Laterality Date   COLONOSCOPY  07/2010   [facility]   CYSTOSCOPY/URETEROSCOPY/HOLMIUM LASER/STENT PLACEMENT Left 01/15/2020   Procedure: CYSTOSCOPY/URETEROSCOPY/HOLMIUM LASER/STENT PLACEMENT;  Surgeon: Devere Lonni Righter, MD;  Location: [facility];  Service: Urology;  Laterality: Left;   kidney stones removed  cystoscopy   x 2   TONSILLECTOMY     x 2   TYMPANOSTOMY TUBE PLACEMENT  as child    Family History  Problem Relation Age of Onset   Diabetes Father    Hypertension Father    Kidney cancer Father        now on dialysis   Kidney Stones Father    Kidney Stones Brother    Huntington's disease Maternal Grandfather    Depression Daughter    Anxiety disorder Daughter    ADD / ADHD Daughter    Colon polyps Daughter 8   Colon cancer Neg Hx  Esophageal cancer Neg Hx    Stomach cancer Neg Hx    Rectal cancer Neg Hx     Social History   Socioeconomic History   Marital status: Married    Spouse name: Not on file   Number of children: 1   Years of education: 18   Highest education level: Not on file  Occupational History   Occupation: MINISTER    Employer: [employer]  Tobacco Use   Smoking status: Never   Smokeless tobacco: Never  Vaping Use   Vaping status: Never Used  Substance and Sexual Activity   Alcohol use: Yes    Comment: less than 1 a week   Drug use: No   Sexual activity: Yes  Other Topics Concern   Not on file  Social History Narrative   Married   Regular exercise- no   HH of 3   Dog car and lizard   Sleep wakening at times   New job [employer]   Per week reg sleep.    Social Drivers of Corporate investment banker  Strain: Not on file  Food Insecurity: No Food Insecurity (08/12/2021)   Received from [facility]   Hunger Vital Sign    Within the past 12 months, you worried that your food would run out before you got the money to buy more.: Never true    Within the past 12 months, the food you bought just didn't last and you didn't have money to get more.: Never true  Transportation Needs: Not on file  Physical Activity: Not on file  Stress: Not on file  Social Connections: Unknown (04/19/2022)   Received from [facility]   Social Network    Social Network: Not on file    Outpatient Medications Prior to Visit  Medication Sig Dispense Refill   buPROPion  (WELLBUTRIN  SR) 150 MG 12 hr tablet Take 1 tablet (150 mg total) by mouth daily in the afternoon. 90 tablet 1   buPROPion  (WELLBUTRIN  XL) 150 MG 24 hr tablet Take 1 tablet (150 mg total) by mouth daily. 90 tablet 1   gabapentin  (NEURONTIN ) 100 MG capsule Take 100 mg by mouth in the morning, at noon, and at bedtime.     metFORMIN  (GLUCOPHAGE -XR) 500 MG 24 hr tablet TAKE 3 TABLETS (1,500 MG TOTAL) BY MOUTH DAILY 90 tablet 0   rosuvastatin  (CRESTOR ) 5 MG tablet TAKE 5 MG BY MOUTH 5 DAYS PER WEEK FOR 2 WEEKS AND THEN INCREASE TO 5 MG 7 DAYS PER WEEK AS TOLERATED 90 tablet 3   tamsulosin  (FLOMAX ) 0.4 MG CAPS capsule Take 0.4 mg by mouth daily.     Vilazodone  HCl 20 MG TABS Take 1 tablet (20 mg total) by mouth daily with breakfast AND 0.5 tablets (10 mg total) daily with supper. (Patient taking differently: Take 20 mg by mouth with breakfast and 10 mg with supper/evening meal) 135 tablet 1   losartan -hydrochlorothiazide  (HYZAAR) 50-12.5 MG tablet Take 2 tablets by mouth daily. 180 tablet 3   ALPRAZolam  (XANAX ) 0.5 MG tablet Take 1/2-1 tab as needed every 4-6 hours as needed for severe anxiety and panic (Patient not taking: Reported on 07/23/2024) 30 tablet 0   azelastine  (OPTIVAR ) 0.05 % ophthalmic solution Place 1 drop into the left eye 2 (two) times daily as  needed (eye redness, itching, irritation). (Patient not taking: Reported on 07/23/2024) 6 mL 0   fluticasone  (FLONASE ) 50 MCG/ACT nasal spray Place 1 spray into both nostrils daily. Begin by using  2 sprays in each nare daily for 3 to 5 days, then decrease to 1 spray in each nare daily. (Patient not taking: Reported on 07/23/2024) 32 mL 1   lidocaine  (LIDODERM ) 5 % Place 1 patch onto the skin daily. Remove & Discard patch within 12 hours or as directed by MD (Patient not taking: Reported on 07/23/2024) 30 patch 0   meloxicam  (MOBIC ) 7.5 MG tablet Take 7.5 mg by mouth daily. (Patient not taking: Reported on 07/23/2024)     methocarbamol (ROBAXIN) 500 MG tablet Take 500 mg by mouth in the morning and at bedtime. (Patient not taking: Reported on 07/23/2024)     oxyCODONE -acetaminophen  (PERCOCET/ROXICET) 5-325 MG tablet Take 1 tablet by mouth every 6 (six) hours as needed for severe pain (pain score 7-10). (Patient not taking: Reported on 07/23/2024) 10 tablet 0   No facility-administered medications prior to visit.     EXAM:  BP 130/78 (BP Location: Right Arm, Patient Position: Sitting, Cuff Size: Large)   Pulse 80   Temp 97.8 F (36.6 C) (Oral)   Ht 5' 6 (1.676 m)   Wt 198 lb 3.2 oz (89.9 kg)   SpO2 95%   BMI 31.99 kg/m   Body mass index is 31.99 kg/m. Wt Readings from Last 3 Encounters:  07/23/24 198 lb 3.2 oz (89.9 kg)  06/20/24 194 lb 3.2 oz (88.1 kg)  05/19/24 200 lb 9.9 oz (91 kg)    Physical Exam: Vital signs reviewed HZW:Uypd is a well-developed well-nourished alert cooperative    who appearsr stated age in no acute distress.  Gait independent  uses cane assis to steady in case HEENT: normocephalic atraumatic , Eyes: PERRL EOM's full, conjunctiva clear, Nares: paten,t no deformity discharge or tenderness.,NECK: supple without masses, thyromegaly or bruits. CHEST/PULM:  Clear to auscultation and percussion breath sounds equal no wheeze , rales or rhonchi. CV: PMI is nondisplaced, S1  S2 no gallops, murmurs, rubs. Peripheral pulses are full without delay.No JVD .  Extremtities:  No clubbing cyanosis or edema, no acute joint swelling or redness no focal atrophy NEURO:  Oriented x3, cranial nerves 3-12 appear to be intact, no obvious focal weakness,SKIN: No acute rashes normal turgor, color, no bruising or petechiae. PSYCH: Oriented, good eye contact, no obvious depression anxiety, cognition and judgment appear normal.  Lab Results  Component Value Date   WBC 9.8 07/09/2024   HGB 16.0 07/09/2024   HCT 47.3 07/09/2024   PLT 243.0 07/09/2024   GLUCOSE 119 (H) 07/09/2024   CHOL 157 07/09/2024   TRIG 88.0 07/09/2024   HDL 54.80 07/09/2024   LDLDIRECT 209.0 03/30/2022   LDLCALC 85 07/09/2024   ALT 21 07/09/2024   AST 15 07/09/2024   NA 142 07/09/2024   K 4.0 07/09/2024   CL 107 07/09/2024   CREATININE 1.18 07/09/2024   BUN 14 07/09/2024   CO2 27 07/09/2024   TSH 1.22 07/09/2024   PSA 0.92 07/09/2024   HGBA1C 6.2 (A) 06/20/2024   MICROALBUR <0.7 07/09/2024    BP Readings from Last 3 Encounters:  07/23/24 130/78  06/20/24 124/74  05/27/24 (!) 147/94  Bp log review   Lab results reviewed with patient   ASSESSMENT AND PLAN:  Discussed the following assessment and plan:    ICD-10-CM   1. Essential hypertension  I10     2. Medication management  Z79.899     3. Diabetes mellitus with coincident hypertension (HCC)  E11.9    I10     4. Low serum  vitamin B12  E53.8     5. Hyperlipidemia, unspecified hyperlipidemia type  E78.5     6. Lumbar radiculopathy  M54.16      Dec losartan  hydrochlorothiazide  to one a day for now and send in update reading in about a month  if ok then remain on that dose. Reviewed bp goals  decrease stress may have helped also  Add b complex for borderline low b12   Ldl a bit u p this year 85  at goal  last year  taking rosuvastatin  daily   Return in about 5 months (around 12/18/2024).  Patient Care Team: Charlett Apolinar POUR, MD  as PCP - General Franchot Harlene SQUIBB, PMHNP as Nurse Practitioner (Psychiatry) Devere Lonni Righter, MD as Consulting Physician (Urology) Dina Sara E, PA-C (Neurology) Patient Instructions  Decrease  the losartan  hctz to 1 per day .  Send in message about readings after  a month . We can then decide if further action is required..  Add  b complexa  vits     ( that  has vit b12 in it  . )  Plan  :  January visit   and  A1c  at the visit . Continue lifestyle intervention healthy eating and exercise .     Xavior Niazi K. Avaiah Stempel M.D.

## 2024-07-24 DIAGNOSIS — M5416 Radiculopathy, lumbar region: Secondary | ICD-10-CM | POA: Diagnosis not present

## 2024-07-26 ENCOUNTER — Ambulatory Visit: Admitting: Psychology

## 2024-07-26 DIAGNOSIS — F4323 Adjustment disorder with mixed anxiety and depressed mood: Secondary | ICD-10-CM

## 2024-07-26 DIAGNOSIS — F3342 Major depressive disorder, recurrent, in full remission: Secondary | ICD-10-CM | POA: Diagnosis not present

## 2024-07-26 DIAGNOSIS — F902 Attention-deficit hyperactivity disorder, combined type: Secondary | ICD-10-CM

## 2024-07-26 NOTE — Progress Notes (Signed)
 [facility] Counselor/Therapist Progress Note  Patient ID: VASHAUN OSMON, MRN: [id number],    Date:07/26/2024  Time Spent: 60 minutes  Time in:  8:00  Time out: 9:00  Treatment Type: Individual Therapy  Reported Symptoms: sadness, inability to pay attention  Mental Status Exam: Appearance:  Casual     Behavior: Appropriate  Motor: Normal  Speech/Language:  Normal Rate  Affect: Appropriate  Mood: pleasant  Thought process: normal  Thought content:   WNL  Sensory/Perceptual disturbances:   WNL  Orientation: oriented to person, place, time/date, and situation  Attention: Good  Concentration: Good  Memory: WNL  Fund of knowledge:  Good  Insight:   Good  Judgment:  Good  Impulse Control: Good   Risk Assessment: Danger to Self:  No Self-injurious Behavior: No Danger to Others: No Duty to Warn:no Physical Aggression / Violence:No  Access to Firearms a concern: No  Gang Involvement:No   Subjective: The patient attended a face-to-face individual therapy session in the office today.  The patient presents as pleasant and cooperative.  The patient seems to be doing better from a physical perspective because his back seems to be better.  He reports that he did go to a Firefighter and he is going to be retiring at the end of this year and he is already announced to his retirement.  He was very excited about that.  He reports that it was so helpful to go to the financial advisor because it helped him feel more confident and being able to retire the way he needs to.  Right now he would like to stay every month to be able to work through what ever it is he needs to work through with his retirement plan.  We will talk about how he wants to move forward after I retire but he may be at a place where he can graduate at that point.  Interventions: Cognitive Behavioral Therapy and Insight-Oriented  Diagnosis:Recurrent major depressive disorder, in full remission  (HCC)  Attention deficit hyperactivity disorder (ADHD), combined type  Adjustment disorder with mixed anxiety and depressed mood  Plan: Client Abilities/Strengths  Intelligent, insightful, motivated  Client Treatment Preferences  Outpatient individual therapy  Client Statement of Needs  I decided I needed therapy to help me sort through a situation I am dealing with  Treatment Level  Outpatient Individual therapy  Symptoms  Increased defensiveness with his wife: (Status: improved). Passive -  aggressive behaviors: (Status: improved).  Problems Addressed  adjustment disorder, Vocational Stress, Vocational Stress, Vocational Stress, Vocational Stress,  Vocational Stress  Goals 1. Decrease incidents of exhibiting passive aggressive behavior Objective Learn and implement problem-solving skills. Target Date: 2025-09-15 Frequency: Monthly Progress: 90 Modality: individual Related Interventions 1. Conduct Problem-Solving Therapy (see Problem-Solving Therapy by Francisco and Nezu) using techniques such as psychoeducation, modeling, and role-playing to teach the client problem solving skills (i.e., defining a problem specifically, generating possible solutions, evaluating the  pros and cons of each solution, selecting and implementing a plan of action, evaluating the  efficacy of the plan, accepting or revising the plan); role-play application of the problemsolving skill to a real life issue (or assign Applying Problem-Solving to Interpersonal Conflict in the Adult Psychotherapy Homework Planner by Jenniffer). 2. Improve satisfaction and comfort surrounding coworker relationships. 3. Increase job satisfaction and performance due to implementation of  assertiveness and stress management strategies. 4. Increase sense of confidence and competence in dealing with work  responsibilities. Objective Verbalize healthy, realistic cognitive messages that promote  harmony with others,  self-acceptance, and  self-confidence. Target Date: 2025-09-15 Frequency: Monthly Progress: 90 Modality: individual 5. Increase sense of self-esteem and elevation of mood in spite of  unemployment. 6. Pursue employment consistency with a reasonably hopeful and positive attitude. 7.  Deal with lifes issues by gaining wisdom and knowledge and implementing wisdom gained.  Diagnosis Adult ADD 296.31 (Major depressive affective disorder, recurrent episode in full remission) - 309.28 (Adjustment disorder with mixed anxiety and depressed mood) - Conditions For Discharge Achievement of treatment goals and objectives  The patient approved this plan and is making progress.  Eron Goble G Zuleyka Kloc, LCSW

## 2024-07-31 DIAGNOSIS — M5416 Radiculopathy, lumbar region: Secondary | ICD-10-CM | POA: Diagnosis not present

## 2024-08-02 ENCOUNTER — Other Ambulatory Visit: Payer: Self-pay | Admitting: Adult Health

## 2024-08-02 DIAGNOSIS — M5416 Radiculopathy, lumbar region: Secondary | ICD-10-CM | POA: Diagnosis not present

## 2024-08-02 DIAGNOSIS — F410 Panic disorder [episodic paroxysmal anxiety] without agoraphobia: Secondary | ICD-10-CM

## 2024-08-02 DIAGNOSIS — F3342 Major depressive disorder, recurrent, in full remission: Secondary | ICD-10-CM

## 2024-08-07 DIAGNOSIS — M5416 Radiculopathy, lumbar region: Secondary | ICD-10-CM | POA: Diagnosis not present

## 2024-08-27 ENCOUNTER — Ambulatory Visit (INDEPENDENT_AMBULATORY_CARE_PROVIDER_SITE_OTHER): Admitting: Psychology

## 2024-08-27 ENCOUNTER — Telehealth: Payer: Self-pay

## 2024-08-27 DIAGNOSIS — F902 Attention-deficit hyperactivity disorder, combined type: Secondary | ICD-10-CM

## 2024-08-27 DIAGNOSIS — F3342 Major depressive disorder, recurrent, in full remission: Secondary | ICD-10-CM | POA: Diagnosis not present

## 2024-08-27 DIAGNOSIS — Z630 Problems in relationship with spouse or partner: Secondary | ICD-10-CM

## 2024-08-27 NOTE — Progress Notes (Unsigned)
 [facility] Counselor/Therapist Progress Note  Patient ID: KANOA PHILLIPPI, MRN: [id number],    Date:08/27/2024  Time Spent: 52 minutes  Time in:  5:08  Time out: 6:00  Treatment Type: Individual Therapy  Reported Symptoms: sadness, inability to pay attention  Mental Status Exam: Appearance:  Casual     Behavior: Appropriate  Motor: Normal  Speech/Language:  Normal Rate  Affect: Appropriate  Mood: pleasant  Thought process: normal  Thought content:   WNL  Sensory/Perceptual disturbances:   WNL  Orientation: oriented to person, place, time/date, and situation  Attention: Good  Concentration: Good  Memory: WNL  Fund of knowledge:  Good  Insight:   Good  Judgment:  Good  Impulse Control: Good   Risk Assessment: Danger to Self:  No Self-injurious Behavior: No Danger to Others: No Duty to Warn:no Physical Aggression / Violence:No  Access to Firearms a concern: No  Gang Involvement:No   Subjective: The patient attended a face-to-face individual therapy session via video visit.  The patient gave verbal consent for the session to be on caregility.  He is aware of the limitations of telehealth.  The patient was in his car alone and the therapist was in the office.  The patient brought up today some concerns he is having with being somewhat dismissive of his wife.  She brought it up again and that has been an ongoing problem that they have had in their marriage for quite some time.  We brainstormed some strategies to utilize in the moment to help him and prevent things in his mind and be more mindful about how he is interacting with his wife.  It seems that they have been having difficulty when they make a decision that he turns around and changes the plan without consulting her thinking it is a better plan and this makes her feel very dismissed.  We talked about a phrase they could use as being if this are final answer.  I also recommended he do some reflective  listening with her so that she understands that she is being heard.  Interventions: Cognitive Behavioral Therapy and Insight-Oriented  Diagnosis:Recurrent major depressive disorder, in full remission  Marital problems  Attention deficit hyperactivity disorder (ADHD), combined type  Plan: Client Abilities/Strengths  Intelligent, insightful, motivated  Client Treatment Preferences  Outpatient individual therapy  Client Statement of Needs  I decided I needed therapy to help me sort through a situation I am dealing with  Treatment Level  Outpatient Individual therapy  Symptoms  Increased defensiveness with his wife: (Status: improved). Passive -  aggressive behaviors: (Status: improved).  Problems Addressed  adjustment disorder, Vocational Stress, Vocational Stress, Vocational Stress, Vocational Stress,  Vocational Stress  Goals 1. Decrease incidents of exhibiting passive aggressive behavior Objective Learn and implement problem-solving skills. Target Date: 2025-09-15 Frequency: Monthly Progress: 90 Modality: individual Related Interventions 1. Conduct Problem-Solving Therapy (see Problem-Solving Therapy by Francisco and Nezu) using techniques such as psychoeducation, modeling, and role-playing to teach the client problem solving skills (i.e., defining a problem specifically, generating possible solutions, evaluating the  pros and cons of each solution, selecting and implementing a plan of action, evaluating the  efficacy of the plan, accepting or revising the plan); role-play application of the problemsolving skill to a real life issue (or assign Applying Problem-Solving to Interpersonal Conflict in the Adult Psychotherapy Homework Planner by Jenniffer). 2. Improve satisfaction and comfort surrounding coworker relationships. 3. Increase job satisfaction and performance due to implementation of  assertiveness and stress management  strategies. 4. Increase sense of confidence and  competence in dealing with work  responsibilities. Objective Verbalize healthy, realistic cognitive messages that promote harmony with others, self-acceptance, and  self-confidence. Target Date: 2025-09-15 Frequency: Monthly Progress: 90 Modality: individual 5. Increase sense of self-esteem and elevation of mood in spite of  unemployment. 6. Pursue employment consistency with a reasonably hopeful and positive attitude. 7.  Deal with lifes issues by gaining wisdom and knowledge and implementing wisdom gained.  Diagnosis Adult ADD 296.31 (Major depressive affective disorder, recurrent episode in full remission) - 309.28 (Adjustment disorder with mixed anxiety and depressed mood) - Conditions For Discharge Achievement of treatment goals and objectives  The patient approved this plan and is making progress.  Shuayb Schepers G Noheli Melder, LCSW

## 2024-08-27 NOTE — Telephone Encounter (Signed)
 PA approved for Vilazodone  20 mg #135/90 day [facility] 07/28/24-08/27/25, PA# [id number]

## 2024-08-30 ENCOUNTER — Emergency Department (HOSPITAL_COMMUNITY)
Admission: EM | Admit: 2024-08-30 | Discharge: 2024-08-30 | Disposition: A | Discharge status: Home / Self Care | Attending: Emergency Medicine | Admitting: Emergency Medicine

## 2024-08-30 ENCOUNTER — Telehealth: Payer: Self-pay

## 2024-08-30 DIAGNOSIS — Z2914 Encounter for prophylactic rabies immune globin: Secondary | ICD-10-CM | POA: Diagnosis not present

## 2024-08-30 DIAGNOSIS — Z203 Contact with and (suspected) exposure to rabies: Secondary | ICD-10-CM | POA: Diagnosis not present

## 2024-08-30 MED ORDER — RABIES IMMUNE GLOBULIN 300 UNIT/2ML IJ SOLN
20.0000 [IU]/kg | Freq: Once | INTRAMUSCULAR | Status: AC
  Administered 2024-08-30: 1800 [IU] via INTRAMUSCULAR
  Filled 2024-08-30: qty 2

## 2024-08-30 MED ORDER — RABIES VACCINE, PCEC IM SUSR
1.0000 mL | Freq: Once | INTRAMUSCULAR | Status: AC
  Administered 2024-08-30: 1 mL via INTRAMUSCULAR
  Filled 2024-08-30: qty 1

## 2024-08-30 NOTE — Telephone Encounter (Signed)
 Copied from CRM #[id number]. Topic: General - Other >> Aug 30, 2024 11:00 AM Jasmin G wrote: Reason for CRM: Pt states that he found a bat flying around his home, pt states that neither him or his daughter had been sleeping in the room where this bat is located, but pt is concerned about possibly contracting rabies, he wanted to know if it would be advised to get rabies boosters just to be safe. Call pt back at [phone] to discuss.

## 2024-08-30 NOTE — ED Provider Notes (Signed)
 [place] EMERGENCY DEPARTMENT AT [facility] Provider Note   CSN: [id number] Arrival date & time: 08/30/24  1725     Patient presents with: Rabies Injection   Jonathan [name] is a 63 y.o. male.   Patient presents with his daughter.  They are concerned for bat exposure in their house.  They have been having some construction done.  Patient did not notice any bites but the bat has been in the house for over 24 hours.  They did notice bat droppings.  He spoke with health department intake nurse and was recommended to come in for postexposure prophylaxis.  The history is provided by the patient. No language interpreter was used.       Prior to Admission medications   Medication Sig Start Date End Date Taking? Authorizing Provider  ALPRAZolam  (XANAX ) 0.5 MG tablet Take 1/2-1 tab as needed every 4-6 hours as needed for severe anxiety and panic Patient not taking: Reported on 07/23/2024 10/16/23   Franchot Harlene SQUIBB, PMHNP  azelastine  (OPTIVAR ) 0.05 % ophthalmic solution Place 1 drop into the left eye 2 (two) times daily as needed (eye redness, itching, irritation). Patient not taking: Reported on 07/23/2024 03/13/24   Mayer, Jodi R, NP  buPROPion  (WELLBUTRIN  SR) 150 MG 12 hr tablet Take 1 tablet (150 mg total) by mouth daily in the afternoon. 05/07/24   Mozingo, Regina Nattalie, NP  buPROPion  (WELLBUTRIN  XL) 150 MG 24 hr tablet Take 1 tablet (150 mg total) by mouth daily. 05/07/24   Mozingo, Regina Nattalie, NP  fluticasone  (FLONASE ) 50 MCG/ACT nasal spray Place 1 spray into both nostrils daily. Begin by using 2 sprays in each nare daily for 3 to 5 days, then decrease to 1 spray in each nare daily. Patient not taking: Reported on 07/23/2024 03/17/22   Joesph Shaver Scales, PA-C  gabapentin  (NEURONTIN ) 100 MG capsule Take 100 mg by mouth in the morning, at noon, and at bedtime.    [provider]  lidocaine  (LIDODERM ) 5 % Place 1 patch onto the skin daily. Remove & Discard  patch within 12 hours or as directed by MD Patient not taking: Reported on 07/23/2024 05/27/24   Neysa Caron PARAS, DO  losartan -hydrochlorothiazide  (HYZAAR) 50-12.5 MG tablet Take 1 tablet by mouth daily. 07/23/24   Panosh, Apolinar POUR, MD  meloxicam  (MOBIC ) 7.5 MG tablet Take 7.5 mg by mouth daily. Patient not taking: Reported on 07/23/2024    [provider]  metFORMIN  (GLUCOPHAGE -XR) 500 MG 24 hr tablet TAKE 3 TABLETS (1,500 MG TOTAL) BY MOUTH DAILY 07/16/24   Panosh, Wanda K, MD  methocarbamol (ROBAXIN) 500 MG tablet Take 500 mg by mouth in the morning and at bedtime. Patient not taking: Reported on 07/23/2024    [provider]  oxyCODONE -acetaminophen  (PERCOCET/ROXICET) 5-325 MG tablet Take 1 tablet by mouth every 6 (six) hours as needed for severe pain (pain score 7-10). Patient not taking: Reported on 07/23/2024 05/27/24   Neysa Caron PARAS, DO  rosuvastatin  (CRESTOR ) 5 MG tablet TAKE 5 MG BY MOUTH 5 DAYS PER WEEK FOR 2 WEEKS AND THEN INCREASE TO 5 MG 7 DAYS PER WEEK AS TOLERATED 07/16/24   Panosh, Apolinar POUR, MD  tamsulosin  (FLOMAX ) 0.4 MG CAPS capsule Take 0.4 mg by mouth daily. 11/16/23   [provider]  Vilazodone  HCl 20 MG TABS TAKE 1 TABLET BY MOUTH DAILY WITH BREAKFAST AND 0.5 TABLETS (10 MG TOTAL) DAILY WITH SUPPER. 08/03/24   Teresa Redell LABOR, NP    Allergies:  Sodium nitrite, Statins, Simvastatin, and Penicillins    Review of Systems  Constitutional:  Negative for chills and fever.  Skin:  Negative for wound.  All other systems reviewed and are negative.   Updated Vital Signs BP (!) 162/84 (BP Location: Right Arm)   Pulse 82   Temp 98.4 F (36.9 C) (Oral)   Resp 18   Ht 5' 8 (1.727 m)   Wt 93.4 kg   SpO2 96%   BMI 31.32 kg/m   Physical Exam Vitals and nursing note reviewed.  Constitutional:      General: He is not in acute distress.    Appearance: Normal appearance. He is not ill-appearing.  HENT:     Head: Normocephalic and atraumatic.     Nose: Nose  normal.  Eyes:     Conjunctiva/sclera: Conjunctivae normal.  Cardiovascular:     Rate and Rhythm: Normal rate and regular rhythm.  Pulmonary:     Effort: Pulmonary effort is normal. No respiratory distress.  Musculoskeletal:        General: No deformity. Normal range of motion.  Skin:    Findings: No rash.  Neurological:     Mental Status: He is alert.     (all labs ordered are listed, but only abnormal results are displayed) Labs Reviewed - No data to display  EKG: None  Radiology: No results found.   Procedures   Medications Ordered in the ED  rabies immune globulin  (HYPERRAB) injection 1,800 Units (has no administration in time range)  rabies vaccine  (RABAVERT ) injection 1 mL (has no administration in time range)                                    Medical Decision Making Risk Prescription drug management.   63 year old male presents today for concern of exposure to bat.  They found a bat last night.  Found droppings all around the house.  No symptoms.  No wounds or bites. Vaccination series ordered. Discharged in stable condition.  Return precaution discussed.  Patient voices understanding and is in agreement with plan.  Final diagnoses:  Need for post exposure prophylaxis for rabies    ED Discharge Orders     None          Hildegard Loge, [place] 08/30/24 1911    Dasie Faden, MD 09/03/24 [phone]

## 2024-08-30 NOTE — Discharge Instructions (Signed)
 You are given your initial round of rabies vaccination.  This series will need to be repeated on day 3, day 7, day 14.  Return for any emergent symptoms.

## 2024-08-30 NOTE — ED Triage Notes (Signed)
 Bat in house since last night. Recent construction in the house.

## 2024-09-02 ENCOUNTER — Encounter: Payer: Self-pay | Admitting: Internal Medicine

## 2024-09-02 ENCOUNTER — Encounter (HOSPITAL_COMMUNITY): Payer: Self-pay | Admitting: *Deleted

## 2024-09-02 ENCOUNTER — Other Ambulatory Visit: Payer: Self-pay

## 2024-09-02 ENCOUNTER — Emergency Department (HOSPITAL_COMMUNITY)
Admission: EM | Admit: 2024-09-02 | Discharge: 2024-09-02 | Disposition: A | Discharge status: Home / Self Care | Attending: Emergency Medicine | Admitting: Emergency Medicine

## 2024-09-02 DIAGNOSIS — Z2914 Encounter for prophylactic rabies immune globin: Secondary | ICD-10-CM | POA: Diagnosis not present

## 2024-09-02 DIAGNOSIS — Z203 Contact with and (suspected) exposure to rabies: Secondary | ICD-10-CM | POA: Diagnosis not present

## 2024-09-02 DIAGNOSIS — Z23 Encounter for immunization: Secondary | ICD-10-CM | POA: Insufficient documentation

## 2024-09-02 MED ORDER — RABIES VACCINE, PCEC IM SUSR
1.0000 mL | Freq: Once | INTRAMUSCULAR | Status: AC
  Administered 2024-09-02: 1 mL via INTRAMUSCULAR
  Filled 2024-09-02: qty 1

## 2024-09-02 NOTE — ED Provider Notes (Signed)
 [place] EMERGENCY DEPARTMENT AT [facility] Provider Note   CSN: [id number] Arrival date & time: 09/02/24  1658     Patient presents with: Rabies Injection   Jonathan [name] is a 63 y.o. male.   Patient here for second rabies vaccine .  Tolerated first round okay.  No symptoms.  The history is provided by the patient.       Prior to Admission medications   Medication Sig Start Date End Date Taking? Authorizing Provider  ALPRAZolam  (XANAX ) 0.5 MG tablet Take 1/2-1 tab as needed every 4-6 hours as needed for severe anxiety and panic Patient not taking: Reported on 07/23/2024 10/16/23   Franchot Harlene SQUIBB, PMHNP  azelastine  (OPTIVAR ) 0.05 % ophthalmic solution Place 1 drop into the left eye 2 (two) times daily as needed (eye redness, itching, irritation). Patient not taking: Reported on 07/23/2024 03/13/24   Mayer, Jodi R, NP  buPROPion  (WELLBUTRIN  SR) 150 MG 12 hr tablet Take 1 tablet (150 mg total) by mouth daily in the afternoon. 05/07/24   Mozingo, Regina Nattalie, NP  buPROPion  (WELLBUTRIN  XL) 150 MG 24 hr tablet Take 1 tablet (150 mg total) by mouth daily. 05/07/24   Mozingo, Regina Nattalie, NP  fluticasone  (FLONASE ) 50 MCG/ACT nasal spray Place 1 spray into both nostrils daily. Begin by using 2 sprays in each nare daily for 3 to 5 days, then decrease to 1 spray in each nare daily. Patient not taking: Reported on 07/23/2024 03/17/22   Joesph Shaver Scales, PA-C  gabapentin  (NEURONTIN ) 100 MG capsule Take 100 mg by mouth in the morning, at noon, and at bedtime.    [provider]  lidocaine  (LIDODERM ) 5 % Place 1 patch onto the skin daily. Remove & Discard patch within 12 hours or as directed by MD Patient not taking: Reported on 07/23/2024 05/27/24   Neysa Caron PARAS, DO  losartan -hydrochlorothiazide  (HYZAAR) 50-12.5 MG tablet Take 1 tablet by mouth daily. 07/23/24   Panosh, Apolinar POUR, MD  meloxicam  (MOBIC ) 7.5 MG tablet Take 7.5 mg by mouth daily. Patient not taking:  Reported on 07/23/2024    [provider]  metFORMIN  (GLUCOPHAGE -XR) 500 MG 24 hr tablet TAKE 3 TABLETS (1,500 MG TOTAL) BY MOUTH DAILY 07/16/24   Panosh, Wanda K, MD  methocarbamol (ROBAXIN) 500 MG tablet Take 500 mg by mouth in the morning and at bedtime. Patient not taking: Reported on 07/23/2024    [provider]  oxyCODONE -acetaminophen  (PERCOCET/ROXICET) 5-325 MG tablet Take 1 tablet by mouth every 6 (six) hours as needed for severe pain (pain score 7-10). Patient not taking: Reported on 07/23/2024 05/27/24   Neysa Caron PARAS, DO  rosuvastatin  (CRESTOR ) 5 MG tablet TAKE 5 MG BY MOUTH 5 DAYS PER WEEK FOR 2 WEEKS AND THEN INCREASE TO 5 MG 7 DAYS PER WEEK AS TOLERATED 07/16/24   Panosh, Apolinar POUR, MD  tamsulosin  (FLOMAX ) 0.4 MG CAPS capsule Take 0.4 mg by mouth daily. 11/16/23   [provider]  Vilazodone  HCl 20 MG TABS TAKE 1 TABLET BY MOUTH DAILY WITH BREAKFAST AND 0.5 TABLETS (10 MG TOTAL) DAILY WITH SUPPER. 08/03/24   Teresa Redell LABOR, NP    Allergies: Sodium nitrite, Statins, Simvastatin, and Penicillins    Review of Systems  Updated Vital Signs BP 130/81 (BP Location: Left Arm)   Pulse 74   Temp 98.4 F (36.9 C)   Resp 16   Ht 5' 8 (1.727 m)   Wt 93.4 kg   SpO2 96%   BMI  31.32 kg/m   Physical Exam Neurological:     Mental Status: He is alert.     (all labs ordered are listed, but only abnormal results are displayed) Labs Reviewed - No data to display  EKG: None  Radiology: No results found.   Procedures   Medications Ordered in the ED  rabies vaccine  (RABAVERT ) injection 1 mL (has no administration in time range)                                    Medical Decision Making Risk Prescription drug management.   Jonathan [name] is here with second vaccine need.  Rabies vaccine  given.  Has no symptoms.  Tolerated well.  Discharge.  This chart was dictated using voice recognition software.  Despite best efforts to proofread,  errors can  occur which can change the documentation meaning.      Final diagnoses:  Need for rabies vaccination    ED Discharge Orders     None          Ruthe Cornet, DO 09/02/24 1734

## 2024-09-02 NOTE — ED Triage Notes (Signed)
2nd Rabies injection 

## 2024-09-03 NOTE — Telephone Encounter (Signed)
 If the only contact was  as described  maybe not.   Although may need to be checked as to why bat in home.... Rabies vaccine  only available in ED.

## 2024-09-04 NOTE — Telephone Encounter (Signed)
 Probably not the med   but I advise off  for a few weeks and if better  restart at reduced dose for a few weeks and go from there .  Would  say could be from  med if clear  off and on effect

## 2024-09-05 NOTE — Telephone Encounter (Signed)
 Attempted to reach pt. Left a voicemail to call us back.

## 2024-09-06 ENCOUNTER — Ambulatory Visit (HOSPITAL_COMMUNITY)
Admission: RE | Admit: 2024-09-06 | Discharge: 2024-09-06 | Disposition: A | Discharge status: Home / Self Care | Source: Ambulatory Visit | Attending: Family Medicine | Admitting: Family Medicine

## 2024-09-06 DIAGNOSIS — Z203 Contact with and (suspected) exposure to rabies: Secondary | ICD-10-CM | POA: Diagnosis not present

## 2024-09-06 MED ORDER — RABIES VACCINE, PCEC IM SUSR
INTRAMUSCULAR | Status: AC
  Filled 2024-09-06: qty 1

## 2024-09-06 MED ORDER — RABIES VIRUS VACCINE, HDC IM SUSR
1.0000 mL | Freq: Once | INTRAMUSCULAR | Status: AC
  Administered 2024-09-06: 1 mL via INTRAMUSCULAR

## 2024-09-06 NOTE — ED Triage Notes (Signed)
Pt here for 3rd rabies injection 

## 2024-09-10 NOTE — Telephone Encounter (Signed)
 Attempted to reach pt. Left a voicemail to call us back.

## 2024-09-13 ENCOUNTER — Ambulatory Visit (HOSPITAL_COMMUNITY)
Admission: EM | Admit: 2024-09-13 | Discharge: 2024-09-13 | Disposition: A | Discharge status: Home / Self Care | Attending: Internal Medicine | Admitting: Internal Medicine

## 2024-09-13 DIAGNOSIS — Z23 Encounter for immunization: Secondary | ICD-10-CM

## 2024-09-13 DIAGNOSIS — Z203 Contact with and (suspected) exposure to rabies: Secondary | ICD-10-CM | POA: Diagnosis not present

## 2024-09-13 MED ORDER — RABIES VIRUS VACCINE, HDC IM SUSR
1.0000 mL | Freq: Once | INTRAMUSCULAR | Status: AC
  Administered 2024-09-13: 1 mL via INTRAMUSCULAR

## 2024-09-13 MED ORDER — RABIES VACCINE, PCEC IM SUSR
INTRAMUSCULAR | Status: AC
  Filled 2024-09-13: qty 1

## 2024-09-13 NOTE — ED Triage Notes (Signed)
 Patient states they are presenting for their last rabies vaccine . Denies any reaction to previous doses.

## 2024-09-13 NOTE — ED Notes (Signed)
Rabies injection given in the right deltoid and tolerated well.

## 2024-09-20 ENCOUNTER — Ambulatory Visit (INDEPENDENT_AMBULATORY_CARE_PROVIDER_SITE_OTHER): Admitting: Psychology

## 2024-09-20 DIAGNOSIS — F3342 Major depressive disorder, recurrent, in full remission: Secondary | ICD-10-CM

## 2024-09-20 DIAGNOSIS — F902 Attention-deficit hyperactivity disorder, combined type: Secondary | ICD-10-CM

## 2024-09-20 DIAGNOSIS — Z630 Problems in relationship with spouse or partner: Secondary | ICD-10-CM
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
## 2024-09-27 DIAGNOSIS — M5416 Radiculopathy, lumbar region: Secondary | ICD-10-CM | POA: Diagnosis not present

## 2024-10-01 ENCOUNTER — Encounter: Payer: Self-pay | Admitting: Adult Health

## 2024-10-01 ENCOUNTER — Telehealth (INDEPENDENT_AMBULATORY_CARE_PROVIDER_SITE_OTHER): Admitting: Adult Health

## 2024-10-01 DIAGNOSIS — F32A Depression, unspecified: Secondary | ICD-10-CM | POA: Diagnosis not present

## 2024-10-01 DIAGNOSIS — F419 Anxiety disorder, unspecified: Secondary | ICD-10-CM | POA: Diagnosis not present

## 2024-10-01 DIAGNOSIS — F331 Major depressive disorder, recurrent, moderate: Secondary | ICD-10-CM

## 2024-10-01 DIAGNOSIS — F411 Generalized anxiety disorder: Secondary | ICD-10-CM

## 2024-10-01 DIAGNOSIS — F410 Panic disorder [episodic paroxysmal anxiety] without agoraphobia: Secondary | ICD-10-CM | POA: Diagnosis not present

## 2024-10-01 DIAGNOSIS — F902 Attention-deficit hyperactivity disorder, combined type: Secondary | ICD-10-CM

## 2024-10-01 MED ORDER — ALPRAZOLAM 0.5 MG PO TABS: ORAL_TABLET | ORAL | 0 refills | Status: DC

## 2024-10-01 NOTE — Progress Notes (Signed)
 Jonathan [name] [id number] 1961/06/18 63 y.o.  Virtual Visit via Video Note  I connected with pt @ on 10/01/24 at  3:00 PM EDT by a video enabled telemedicine application and verified that I am speaking with the correct person using two identifiers.   I discussed the limitations of evaluation and management by telemedicine and the availability of in person appointments. The patient expressed understanding and agreed to proceed.  I discussed the assessment and treatment plan with the patient. The patient was provided an opportunity to ask questions and all were answered. The patient agreed with the plan and demonstrated an understanding of the instructions.   The patient was advised to call back or seek an in-person evaluation if the symptoms worsen or if the condition fails to improve as anticipated.  I provided 25 minutes of non-face-to-face time during this encounter.  The patient was located at home.  The provider was located at [facility].   Angeline LOISE Sayers, NP   Subjective:   Patient ID:  Jonathan [name] is a 63 y.o. (DOB Aug 27, 1961) male.  Chief Complaint: No chief complaint on file.   HPI ESTANISLADO SURGEON presents for follow-up of ADHD, anxiety and depression.   Previously seen by Harlene Pepper.   Describes mood today as ok. Pleasant. Denies tearfulness. Mood symptoms - reports depression and irritability. Reports lower interest and motivation - I have to force myself to go through the motions to find something meaningful to say. Stating I get a defeatist attitude. Denies anxiety - not a lot. Denies panic attacks - I have been close. Reports over thinking, worry and rumination - I can't quiet the monkeys in my tree. Denies obsessive thoughts and acts. Reports mood is lower. Stating I feel like I'm doing ok enough. Taking medications as prescribed.  Energy levels lower. Active, does not have a regular exercise routine - walking. Enjoys some usual  interests and activities. Spending time with family. Appetite adequate. Weight gain - 5 to 7 pounds. Reports sleeping better some nights than others. Averages 5 to 6 hours of interrupted. Reports some daytime napping - 45 minutes. Focus and concentration improved with Wellbutrin . Completing tasks. Managing aspects of household. Working full time [employer] - reports upcoming retirement.  Denies SI or HI.  Denies AH or VH. Denies self harm. Denies substance use.  Past Psychiatric Medication Trials: Citalopram - Sexual side effects, fatigue. Partially effective for depression. Increased to 15 mg in the last month and noticed more fatigue. He reports that he has been taking 10 mg most of the time since then.  Trintellix - Improved mood, anxiety, and focus on 5 mg. Excessive somnolence on 10 mg. Pristiq  Wellbutrin - Took 25 years ago. Cannot recall response. Concerta - Effective. Raised BP. Caused jitteriness.  Modafinil -Helpful for concentration. Caused some edginess   Review of Systems:  Review of Systems  Musculoskeletal:  Negative for gait problem.  Neurological:  Negative for tremors.  Psychiatric/Behavioral:         Please refer to HPI    Medications: I have reviewed the patient's current medications.  Current Outpatient Medications  Medication Sig Dispense Refill   ALPRAZolam  (XANAX ) 0.5 MG tablet Take 1/2-1 tab as needed every 4-6 hours as needed for severe anxiety and panic (Patient not taking: Reported on 07/23/2024) 30 tablet 0   azelastine  (OPTIVAR ) 0.05 % ophthalmic solution Place 1 drop into the left eye 2 (two) times daily as needed (eye redness, itching, irritation). (Patient not taking: Reported on 07/23/2024) 6  mL 0   buPROPion  (WELLBUTRIN  SR) 150 MG 12 hr tablet Take 1 tablet (150 mg total) by mouth daily in the afternoon. 90 tablet 1   buPROPion  (WELLBUTRIN  XL) 150 MG 24 hr tablet Take 1 tablet (150 mg total) by mouth daily. 90 tablet 1   fluticasone  (FLONASE )  50 MCG/ACT nasal spray Place 1 spray into both nostrils daily. Begin by using 2 sprays in each nare daily for 3 to 5 days, then decrease to 1 spray in each nare daily. (Patient not taking: Reported on 07/23/2024) 32 mL 1   gabapentin  (NEURONTIN ) 100 MG capsule Take 100 mg by mouth in the morning, at noon, and at bedtime.     lidocaine  (LIDODERM ) 5 % Place 1 patch onto the skin daily. Remove & Discard patch within 12 hours or as directed by MD (Patient not taking: Reported on 07/23/2024) 30 patch 0   losartan -hydrochlorothiazide  (HYZAAR) 50-12.5 MG tablet Take 1 tablet by mouth daily. 90 tablet 3   meloxicam  (MOBIC ) 7.5 MG tablet Take 7.5 mg by mouth daily. (Patient not taking: Reported on 07/23/2024)     metFORMIN  (GLUCOPHAGE -XR) 500 MG 24 hr tablet TAKE 3 TABLETS (1,500 MG TOTAL) BY MOUTH DAILY 90 tablet 0   methocarbamol (ROBAXIN) 500 MG tablet Take 500 mg by mouth in the morning and at bedtime. (Patient not taking: Reported on 07/23/2024)     oxyCODONE -acetaminophen  (PERCOCET/ROXICET) 5-325 MG tablet Take 1 tablet by mouth every 6 (six) hours as needed for severe pain (pain score 7-10). (Patient not taking: Reported on 07/23/2024) 10 tablet 0   rosuvastatin  (CRESTOR ) 5 MG tablet TAKE 5 MG BY MOUTH 5 DAYS PER WEEK FOR 2 WEEKS AND THEN INCREASE TO 5 MG 7 DAYS PER WEEK AS TOLERATED 90 tablet 3   tamsulosin  (FLOMAX ) 0.4 MG CAPS capsule Take 0.4 mg by mouth daily.     Vilazodone  HCl 20 MG TABS TAKE 1 TABLET BY MOUTH DAILY WITH BREAKFAST AND 0.5 TABLETS (10 MG TOTAL) DAILY WITH SUPPER. 135 tablet 1   No current facility-administered medications for this visit.    Medication Side Effects: None  Allergies:  Allergies  Allergen Reactions   Sodium Nitrite Nausea Only   Statins Other (See Comments)    Myalgias      Simvastatin Other (See Comments)    Myalgias and arthralgia (joint pain)   Penicillins Hives and Other (See Comments)    Per pt - happened in childhood - hives possible reaction    Past  Medical History:  Diagnosis Date   Allergic rhinitis    Allergy    Anxiety    Depression    Diabetes mellitus without complication (HCC)    type 2   Heart murmur    per pt told it was functional   History of kidney stones    History of nephrolithiasis    Hyperlipidemia    Hypertension    Sebaceous cyst     Family History  Problem Relation Age of Onset   Diabetes Father    Hypertension Father    Kidney cancer Father        now on dialysis   Kidney Stones Father    Kidney Stones Brother    Huntington's disease Maternal Grandfather    Depression Daughter    Anxiety disorder Daughter    ADD / ADHD Daughter    Colon polyps Daughter 7   Colon cancer Neg Hx    Esophageal cancer Neg Hx    Stomach cancer Neg  Hx    Rectal cancer Neg Hx     Social History   Socioeconomic History   Marital status: Married    Spouse name: Not on file   Number of children: 1   Years of education: 18   Highest education level: Not on file  Occupational History   Occupation: MINISTER    Employer: [employer]  Tobacco Use   Smoking status: Never   Smokeless tobacco: Never  Vaping Use   Vaping status: Never Used  Substance and Sexual Activity   Alcohol use: Yes    Comment: less than 1 a week   Drug use: No   Sexual activity: Yes  Other Topics Concern   Not on file  Social History Narrative   Married   Regular exercise- no   HH of 3   Dog car and lizard   Sleep wakening at times   New job [employer]   Per week reg sleep.    Social Drivers of Corporate Investment Banker Strain: Not on file  Food Insecurity: No Food Insecurity (08/12/2021)   [facility] from [facility]   Hunger Vital Sign    Within the past 12 months, you worried that your food would run out before you got the money to buy more.: Never true    Within the past 12 months, the food you bought just didn't last and you didn't have money to get more.: Never true  Transportation Needs: Not on file   Physical Activity: Not on file  Stress: Not on file  Social Connections: Unknown (04/19/2022)   Received from [facility]   Social Network    Social Network: Not on file  Intimate Partner Violence: Unknown (03/11/2022)   Received from [facility]   HITS    Physically Hurt: Not on file    Insult or Talk Down To: Not on file    Threaten Physical Harm: Not on file    Scream or Curse: Not on file    Past Medical History, Surgical history, Social history, and Family history were reviewed and updated as appropriate.   Please see review of systems for further details on the patient's review from today.   Objective:   Physical Exam:  There were no vitals taken for this visit.  Physical Exam Constitutional:      General: He is not in acute distress. Musculoskeletal:        General: No deformity.  Neurological:     Mental Status: He is alert and oriented to person, place, and time.     Coordination: Coordination normal.  Psychiatric:        Attention and Perception: Attention and perception normal. He does not perceive auditory or visual hallucinations.        Mood and Affect: Mood normal. Mood is not anxious or depressed. Affect is not labile, blunt, angry or inappropriate.        Speech: Speech normal.        Behavior: Behavior normal.        Thought Content: Thought content normal. Thought content is not paranoid or delusional. Thought content does not include homicidal or suicidal ideation. Thought content does not include homicidal or suicidal plan.        Cognition and Memory: Cognition and memory normal.        Judgment: Judgment normal.     Comments: Insight intact     Lab Review:     Component Value Date/Time   NA  142 07/09/2024 0838   NA 141 08/17/2021 0000   K 4.0 07/09/2024 0838   CL 107 07/09/2024 0838   CO2 27 07/09/2024 0838   GLUCOSE 119 (H) 07/09/2024 0838   BUN 14 07/09/2024 0838   CREATININE 1.18 07/09/2024 0838   CREATININE 1.15 10/19/2020 1206    CALCIUM  9.0 07/09/2024 0838   PROT 6.1 07/09/2024 0838   ALBUMIN 4.1 07/09/2024 0838   AST 15 07/09/2024 0838   ALT 21 07/09/2024 0838   ALKPHOS 59 07/09/2024 0838   BILITOT 0.5 07/09/2024 0838   GFRNONAA 53 (L) 05/27/2024 2125   GFRNONAA 68 10/19/2020 1206   GFRAA 83.5 08/17/2021 0000   GFRAA 79 10/19/2020 1206       Component Value Date/Time   WBC 9.8 07/09/2024 0838   RBC 5.15 07/09/2024 0838   HGB 16.0 07/09/2024 0838   HCT 47.3 07/09/2024 0838   PLT 243.0 07/09/2024 0838   MCV 91.8 07/09/2024 0838   MCV 94.2 04/29/2013 0936   MCH 31.0 05/27/2024 2125   MCHC 33.9 07/09/2024 0838   RDW 13.4 07/09/2024 0838   LYMPHSABS 2.3 07/09/2024 0838   MONOABS 1.1 (H) 07/09/2024 0838   EOSABS 1.1 (H) 07/09/2024 0838   BASOSABS 0.1 07/09/2024 0838    No results found for: POCLITH, LITHIUM   No results found for: PHENYTOIN, PHENOBARB, VALPROATE, CBMZ   .res Assessment: Plan:    Plan:   Wellbutrin  SR 150 mg daily in the afternoon for mood and off-label indication of ADHD.  Wellbutrin  XL150 mg daily in the morning for depression.  Viibryd  20 mg in the morning and 10 mg daily in the afternoon for anxiety and depression.  Alprazolam  0.5 mg 1/2-1 tab as needed for anxiety and panic.   Recommend continuing therapy with Bambi Cottle, LCSW.  RTC 4 weeks  25 minutes spent dedicated to the care of this patient on the date of this encounter to include pre-visit review of records, ordering of medication, post visit documentation, and face-to-face time with the patient discussing current medication regimen. Will check BP over the next 3 days and call to report readings. If BP WNL will consider low dose of Concerta  - 18mg  every morning.  Patient advised to contact office with any questions, adverse effects, or acute worsening in signs and symptoms.  There are no diagnoses linked to this encounter.   Please see After Visit Summary for patient specific instructions.  Future  Appointments  Date Time Provider Department Center  10/01/2024  3:00 PM Jomarie Gellis Nattalie, NP [facility] None  10/22/2024  8:00 AM Cottle, Bambi G, LCSW [facility] None  12/18/2024  9:45 AM Panosh, Apolinar POUR, MD [facility] [address]    No orders of the defined types were placed in this encounter.     -------------------------------

## 2024-10-04 ENCOUNTER — Other Ambulatory Visit: Payer: Self-pay | Admitting: Adult Health

## 2024-10-04 ENCOUNTER — Other Ambulatory Visit: Payer: Self-pay

## 2024-10-04 ENCOUNTER — Telehealth: Payer: Self-pay | Admitting: Adult Health

## 2024-10-04 DIAGNOSIS — F902 Attention-deficit hyperactivity disorder, combined type: Secondary | ICD-10-CM

## 2024-10-04 MED ORDER — METHYLPHENIDATE HCL ER (OSM) 18 MG PO TBCR: 18.0000 mg | EXTENDED_RELEASE_TABLET | Freq: Every day | ORAL | 0 refills | Status: DC

## 2024-10-04 NOTE — Telephone Encounter (Signed)
 See message with BP readings

## 2024-10-04 NOTE — Telephone Encounter (Signed)
 Pended

## 2024-10-04 NOTE — Telephone Encounter (Signed)
 Jorje called and said that gina wanted a record of BP for the last 3 days. He wants me to let gina know that he taking 1/2 of his hypertension medicine with pcp approval. 10/02/24 135/85 heart rate 61, 10/03/24 129/79 heart rate 65, and 10/04/24 133/79 heart rate 58. Tillman wanted this info to see if she can put him on concerta 

## 2024-10-18 ENCOUNTER — Other Ambulatory Visit: Payer: Self-pay | Admitting: Internal Medicine

## 2024-10-22 ENCOUNTER — Ambulatory Visit: Admitting: Psychology

## 2024-10-22 DIAGNOSIS — F3342 Major depressive disorder, recurrent, in full remission: Secondary | ICD-10-CM

## 2024-10-22 DIAGNOSIS — F902 Attention-deficit hyperactivity disorder, combined type: Secondary | ICD-10-CM

## 2024-10-22 DIAGNOSIS — F411 Generalized anxiety disorder: Secondary | ICD-10-CM

## 2024-10-22 NOTE — Progress Notes (Signed)
 [name] Counselor/Therapist Progress Note  Patient ID: Jonathan [name], MRN: [id number],    Date:10/22/2024  Time Spent: 60 minutes  Time in:  8:00  Time out: 9:00  Treatment Type: Individual Therapy  Reported Symptoms: sadness, inability to pay attention  Mental Status Exam: Appearance:  Casual     Behavior: Appropriate  Motor: Normal  Speech/Language:  Normal Rate  Affect: Appropriate  Mood: pleasant  Thought process: normal  Thought content:   WNL  Sensory/Perceptual disturbances:   WNL  Orientation: oriented to person, place, time/date, and situation  Attention: Good  Concentration: Good  Memory: WNL  Fund of knowledge:  Good  Insight:   Good  Judgment:  Good  Impulse Control: Good   Risk Assessment: Danger to Self:  No Self-injurious Behavior: No Danger to Others: No Duty to Warn:no Physical Aggression / Violence:No  Access to Firearms a concern: No  Gang Involvement:No   Subjective: The patient attended a face-to-face individual therapy session in the office today.  The patient presents as pleasant and cooperative.  The patient came in saying that he could feel the breeze.  He is talking about his retirement that is coming at the end of December.  He talked about all the things that he is going to do to get himself ready to retire.  He does seem to have a good plan for how to transition out of his current job.  We talked about retirement moving forward and I asked him about what he was going to do to fill his time.  He said that he and his wife would be taking care of a lot of the state things from her family and from him.  He is already using an app to try to structure his day better with goals.  The patient seems to be managing things well and he reports that he would like to graduate when he retires and he will seek out Spiritual Counsel at that time. Interventions: Cognitive Behavioral Therapy and Insight-Oriented  Diagnosis:Attention  deficit hyperactivity disorder (ADHD), combined type  Generalized anxiety disorder  Recurrent major depressive disorder, in full remission  Plan: Client Abilities/Strengths  Intelligent, insightful, motivated  Client Treatment Preferences  Outpatient individual therapy  Client Statement of Needs  I decided I needed therapy to help me sort through a situation I am dealing with  Treatment Level  Outpatient Individual therapy  Symptoms  Increased defensiveness with his wife: (Status: improved). Passive -  aggressive behaviors: (Status: improved).  Problems Addressed  adjustment disorder, Vocational Stress, Vocational Stress, Vocational Stress, Vocational Stress,  Vocational Stress  Goals 1. Decrease incidents of exhibiting passive aggressive behavior Objective Learn and implement problem-solving skills. Target Date: 2025-09-15 Frequency: Monthly Progress: 90 Modality: individual Related Interventions 1. Conduct Problem-Solving Therapy (see Problem-Solving Therapy by Francisco and Nezu) using techniques such as psychoeducation, modeling, and role-playing to teach the client problem solving skills (i.e., defining a problem specifically, generating possible solutions, evaluating the  pros and cons of each solution, selecting and implementing a plan of action, evaluating the  efficacy of the plan, accepting or revising the plan); role-play application of the problemsolving skill to a real life issue (or assign Applying Problem-Solving to Interpersonal Conflict in the Adult Psychotherapy Homework Planner by Jenniffer). 2. Improve satisfaction and comfort surrounding coworker relationships. 3. Increase job satisfaction and performance due to implementation of  assertiveness and stress management strategies. 4. Increase sense of confidence and competence in dealing with work  responsibilities. Objective Verbalize healthy,  realistic cognitive messages that promote harmony with others,  self-acceptance, and  self-confidence. Target Date: 2025-09-15 Frequency: Monthly Progress: 90 Modality: individual 5. Increase sense of self-esteem and elevation of mood in spite of  unemployment. 6. Pursue employment consistency with a reasonably hopeful and positive attitude. 7.  Deal with lifes issues by gaining wisdom and knowledge and implementing wisdom gained.  Diagnosis Adult ADD 296.31 (Major depressive affective disorder, recurrent episode in full remission) - 309.28 (Adjustment disorder with mixed anxiety and depressed mood) - Conditions For Discharge Achievement of treatment goals and objectives  The patient approved this plan and is making progress.  Halei Hanover G Othello Dickenson, LCSW

## 2024-10-27 ENCOUNTER — Other Ambulatory Visit: Payer: Self-pay | Admitting: Adult Health

## 2024-10-27 DIAGNOSIS — F902 Attention-deficit hyperactivity disorder, combined type: Secondary | ICD-10-CM

## 2024-10-27 DIAGNOSIS — F3342 Major depressive disorder, recurrent, in full remission: Secondary | ICD-10-CM

## 2024-11-04 ENCOUNTER — Encounter: Payer: Self-pay | Admitting: Adult Health

## 2024-11-04 ENCOUNTER — Telehealth: Admitting: Adult Health

## 2024-11-04 DIAGNOSIS — F410 Panic disorder [episodic paroxysmal anxiety] without agoraphobia: Secondary | ICD-10-CM | POA: Diagnosis not present

## 2024-11-04 DIAGNOSIS — F902 Attention-deficit hyperactivity disorder, combined type: Secondary | ICD-10-CM

## 2024-11-04 DIAGNOSIS — F32A Depression, unspecified: Secondary | ICD-10-CM

## 2024-11-04 DIAGNOSIS — F909 Attention-deficit hyperactivity disorder, unspecified type: Secondary | ICD-10-CM

## 2024-11-04 DIAGNOSIS — F419 Anxiety disorder, unspecified: Secondary | ICD-10-CM | POA: Diagnosis not present

## 2024-11-04 DIAGNOSIS — F411 Generalized anxiety disorder: Secondary | ICD-10-CM

## 2024-11-04 DIAGNOSIS — F3342 Major depressive disorder, recurrent, in full remission: Secondary | ICD-10-CM

## 2024-11-04 MED ORDER — BUPROPION HCL ER (SR) 150 MG PO TB12: 150.0000 mg | ORAL_TABLET | Freq: Every day | ORAL | 1 refills | Status: AC

## 2024-11-04 MED ORDER — BUPROPION HCL ER (XL) 150 MG PO TB24: 150.0000 mg | ORAL_TABLET | Freq: Every day | ORAL | 1 refills | Status: AC

## 2024-11-04 MED ORDER — METHYLPHENIDATE HCL ER (OSM) 18 MG PO TBCR: 18.0000 mg | EXTENDED_RELEASE_TABLET | Freq: Every day | ORAL | 0 refills | Status: DC

## 2024-11-04 MED ORDER — ALPRAZOLAM 0.5 MG PO TABS: ORAL_TABLET | ORAL | 2 refills | Status: AC

## 2024-11-04 NOTE — Progress Notes (Signed)
 Jonathan [name] [id number] 1961/02/04 63 y.o.  Virtual Visit via Video Note  I connected with pt @ on 11/04/24 at 10:30 AM EST by a video enabled telemedicine application and verified that I am speaking with the correct person using two identifiers.   I discussed the limitations of evaluation and management by telemedicine and the availability of in person appointments. The patient expressed understanding and agreed to proceed.  I discussed the assessment and treatment plan with the patient. The patient was provided an opportunity to ask questions and all were answered. The patient agreed with the plan and demonstrated an understanding of the instructions.   The patient was advised to call back or seek an in-person evaluation if the symptoms worsen or if the condition fails to improve as anticipated.  I provided 25 minutes of non-face-to-face time during this encounter.  The patient was located at home.  The provider was located at [facility].   Angeline LOISE Sayers, NP   Subjective:   Patient ID:  Jonathan [name] is a 63 y.o. (DOB 1961/02/01) male.  Chief Complaint: No chief complaint on file.   HPI Jonathan [name] presents for follow-up of ADHD, anxiety and depression.   Previously seen by Harlene Pepper.   Describes mood today as ok. Pleasant. Denies tearfulness. Mood symptoms - denies anxiety, depression and irritability. Reports improved interest and motivation. Denies panic attacks. Denies over thinking, worry and rumination. Denies obsessive thoughts and acts. Reports mood is improved. Stating I feel like I'm doing better. Taking medications as prescribed. Feels like the addition of Concerta  has been helpful. Energy levels improved. Active, does not have a regular exercise routine - walking. Enjoys some usual interests and activities. Spending time with family. Appetite adequate. Weight stable.   Reports sleeping better some nights than others. Averages 5  to 6 hours of interrupted sleep. Reports some daytime napping - 45 minutes. Focus and concentration improved. Completing tasks. Managing aspects of household. Working full time [employer] - reports upcoming retirement.  Denies SI or HI.  Denies AH or VH. Denies self harm. Denies substance use.  Past Psychiatric Medication Trials: Citalopram - Sexual side effects, fatigue. Partially effective for depression. Increased to 15 mg in the last month and noticed more fatigue. He reports that he has been taking 10 mg most of the time since then.  Trintellix - Improved mood, anxiety, and focus on 5 mg. Excessive somnolence on 10 mg. Pristiq  Wellbutrin - Took 25 years ago. Cannot recall response. Concerta - Effective. Raised BP. Caused jitteriness.  Modafinil -Helpful for concentration. Caused some edginess   Review of Systems:  Review of Systems  Musculoskeletal:  Negative for gait problem.  Neurological:  Negative for tremors.  Psychiatric/Behavioral:         Please refer to HPI    Medications: I have reviewed the patient's current medications.  Current Outpatient Medications  Medication Sig Dispense Refill   ALPRAZolam  (XANAX ) 0.5 MG tablet Take 1/2-1 tab as needed every 4-6 hours as needed for severe anxiety and panic 30 tablet 0   azelastine  (OPTIVAR ) 0.05 % ophthalmic solution Place 1 drop into the left eye 2 (two) times daily as needed (eye redness, itching, irritation). (Patient not taking: Reported on 07/23/2024) 6 mL 0   buPROPion  (WELLBUTRIN  SR) 150 MG 12 hr tablet TAKE 1 TABLET (150 MG TOTAL) BY MOUTH DAILY IN THE AFTERNOON. 30 tablet 0   buPROPion  (WELLBUTRIN  XL) 150 MG 24 hr tablet Take 1 tablet (150 mg total) by mouth daily.  90 tablet 1   fluticasone  (FLONASE ) 50 MCG/ACT nasal spray Place 1 spray into both nostrils daily. Begin by using 2 sprays in each nare daily for 3 to 5 days, then decrease to 1 spray in each nare daily. (Patient not taking: Reported on 07/23/2024) 32  mL 1   gabapentin  (NEURONTIN ) 100 MG capsule Take 100 mg by mouth in the morning, at noon, and at bedtime.     lidocaine  (LIDODERM ) 5 % Place 1 patch onto the skin daily. Remove & Discard patch within 12 hours or as directed by MD (Patient not taking: Reported on 07/23/2024) 30 patch 0   losartan -hydrochlorothiazide  (HYZAAR) 50-12.5 MG tablet Take 1 tablet by mouth daily. 90 tablet 3   meloxicam  (MOBIC ) 7.5 MG tablet Take 7.5 mg by mouth daily. (Patient not taking: Reported on 07/23/2024)     metFORMIN  (GLUCOPHAGE -XR) 500 MG 24 hr tablet TAKE 3 TABLETS (1,500 MG TOTAL) BY MOUTH DAILY 270 tablet 1   methocarbamol (ROBAXIN) 500 MG tablet Take 500 mg by mouth in the morning and at bedtime. (Patient not taking: Reported on 07/23/2024)     methylphenidate  (CONCERTA ) 18 MG PO CR tablet Take 1 tablet (18 mg total) by mouth daily. 30 tablet 0   oxyCODONE -acetaminophen  (PERCOCET/ROXICET) 5-325 MG tablet Take 1 tablet by mouth every 6 (six) hours as needed for severe pain (pain score 7-10). (Patient not taking: Reported on 07/23/2024) 10 tablet 0   rosuvastatin  (CRESTOR ) 5 MG tablet TAKE 5 MG BY MOUTH 5 DAYS PER WEEK FOR 2 WEEKS AND THEN INCREASE TO 5 MG 7 DAYS PER WEEK AS TOLERATED 90 tablet 3   tamsulosin  (FLOMAX ) 0.4 MG CAPS capsule Take 0.4 mg by mouth daily.     Vilazodone  HCl 20 MG TABS TAKE 1 TABLET BY MOUTH DAILY WITH BREAKFAST AND 0.5 TABLETS (10 MG TOTAL) DAILY WITH SUPPER. 135 tablet 1   No current facility-administered medications for this visit.    Medication Side Effects: None  Allergies:  Allergies  Allergen Reactions   Sodium Nitrite Nausea Only   Statins Other (See Comments)    Myalgias      Simvastatin Other (See Comments)    Myalgias and arthralgia (joint pain)   Penicillins Hives and Other (See Comments)    Per pt - happened in childhood - hives possible reaction    Past Medical History:  Diagnosis Date   Allergic rhinitis    Allergy    Anxiety    Depression    Diabetes  mellitus without complication (HCC)    type 2   Heart murmur    per pt told it was functional   History of kidney stones    History of nephrolithiasis    Hyperlipidemia    Hypertension    Sebaceous cyst     Family History  Problem Relation Age of Onset   Diabetes Father    Hypertension Father    Kidney cancer Father        now on dialysis   Kidney Stones Father    Kidney Stones Brother    Huntington's disease Maternal Grandfather    Depression Daughter    Anxiety disorder Daughter    ADD / ADHD Daughter    Colon polyps Daughter 88   Colon cancer Neg Hx    Esophageal cancer Neg Hx    Stomach cancer Neg Hx    Rectal cancer Neg Hx     Social History   Socioeconomic History   Marital status: Married  Spouse name: Not on file   Number of children: 1   Years of education: 6   Highest education level: Not on file  Occupational History   Occupation: MINISTER    Employer: [employer]  Tobacco Use   Smoking status: Never   Smokeless tobacco: Never  Vaping Use   Vaping status: Never Used  Substance and Sexual Activity   Alcohol use: Yes    Comment: less than 1 a week   Drug use: No   Sexual activity: Yes  Other Topics Concern   Not on file  Social History Narrative   Married   Regular exercise- no   HH of 3   Dog car and lizard   Sleep wakening at times   [employer] job [employer]   Per week reg sleep.    Social Drivers of Corporate Investment Banker Strain: Not on file  Food Insecurity: No Food Insecurity (08/12/2021)   [facility]   Hunger Vital Sign    Within the past 12 months, you worried that your food would run out before you got the money to buy more.: Never true    Within the past 12 months, the food you bought just didn't last and you didn't have money to get more.: Never true  Transportation Needs: Not on file  Physical Activity: Not on file  Stress: Not on file  Social Connections: Unknown (04/19/2022)   [facility]  [facility]   Social Network    Social Network: Not on file  Intimate Partner Violence: Unknown (03/11/2022)   Received from [facility]   HITS    Physically Hurt: Not on file    Insult or Talk Down To: Not on file    Threaten Physical Harm: Not on file    Scream or Curse: Not on file    Past Medical History, Surgical history, Social history, and Family history were reviewed and updated as appropriate.   Please see review of systems for further details on the patient's review from today.   Objective:   Physical Exam:  There were no vitals taken for this visit.  Physical Exam Constitutional:      General: He is not in acute distress. Musculoskeletal:        General: No deformity.  Neurological:     Mental Status: He is alert and oriented to person, place, and time.     Coordination: Coordination normal.  Psychiatric:        Attention and Perception: Attention and perception normal. He does not perceive auditory or visual hallucinations.        Mood and Affect: Mood normal. Mood is not anxious or depressed. Affect is not labile, blunt, angry or inappropriate.        Speech: Speech normal.        Behavior: Behavior normal.        Thought Content: Thought content normal. Thought content is not paranoid or delusional. Thought content does not include homicidal or suicidal ideation. Thought content does not include homicidal or suicidal plan.        Cognition and Memory: Cognition and memory normal.        Judgment: Judgment normal.     Comments: Insight intact     Lab Review:     Component Value Date/Time   NA 142 07/09/2024 0838   NA 141 08/17/2021 0000   K 4.0 07/09/2024 0838   CL 107 07/09/2024 0838   CO2 27 07/09/2024  9161   GLUCOSE 119 (H) 07/09/2024 0838   BUN 14 07/09/2024 0838   CREATININE 1.18 07/09/2024 0838   CREATININE 1.15 10/19/2020 1206   CALCIUM  9.0 07/09/2024 0838   PROT 6.1 07/09/2024 0838   ALBUMIN 4.1 07/09/2024 0838   AST 15 07/09/2024 0838    ALT 21 07/09/2024 0838   ALKPHOS 59 07/09/2024 0838   BILITOT 0.5 07/09/2024 0838   GFRNONAA 53 (L) 05/27/2024 2125   GFRNONAA 68 10/19/2020 1206   GFRAA 83.5 08/17/2021 0000   GFRAA 79 10/19/2020 1206       Component Value Date/Time   WBC 9.8 07/09/2024 0838   RBC 5.15 07/09/2024 0838   HGB 16.0 07/09/2024 0838   HCT 47.3 07/09/2024 0838   PLT 243.0 07/09/2024 0838   MCV 91.8 07/09/2024 0838   MCV 94.2 04/29/2013 0936   MCH 31.0 05/27/2024 2125   MCHC 33.9 07/09/2024 0838   RDW 13.4 07/09/2024 0838   LYMPHSABS 2.3 07/09/2024 0838   MONOABS 1.1 (H) 07/09/2024 0838   EOSABS 1.1 (H) 07/09/2024 0838   BASOSABS 0.1 07/09/2024 0838    No results found for: POCLITH, LITHIUM   No results found for: PHENYTOIN, PHENOBARB, VALPROATE, CBMZ   .res Assessment: Plan:    Plan:  Added last visit: Concerta  - 18mg  every morning between visits - doing well with current dose - taking Losartan  for BP.   Wellbutrin  SR 150 mg daily in the afternoon for mood and off-label indication of ADHD.  Wellbutrin  XL150 mg daily in the morning for depression.  Viibryd  20 mg in the morning and 10 mg daily in the afternoon for anxiety and depression.  Alprazolam  0.5 mg 1/2-1 tab as needed for anxiety and panic.   Recommend continuing therapy with Bambi Cottle, LCSW.  RTC 4 weeks  25 minutes spent dedicated to the care of this patient on the date of this encounter to include pre-visit review of records, ordering of medication, post visit documentation, and face-to-face time with the patient discussing current medication regimen.   Patient advised to contact office with any questions, adverse effects, or acute worsening in signs and symptoms.   There are no diagnoses linked to this encounter.   Please see After Visit Summary for patient specific instructions.  Future Appointments  Date Time Provider Department Center  11/04/2024 10:30 AM Jaquasha Carnevale Nattalie, NP [facility] None   12/18/2024  9:45 AM Panosh, Apolinar POUR, MD [id number] [address]    No orders of the defined types were placed in this encounter.     -------------------------------

## 2024-11-13 ENCOUNTER — Other Ambulatory Visit: Payer: Self-pay | Admitting: Family

## 2024-11-21 ENCOUNTER — Ambulatory Visit (INDEPENDENT_AMBULATORY_CARE_PROVIDER_SITE_OTHER): Admitting: Psychology

## 2024-11-21 DIAGNOSIS — F4323 Adjustment disorder with mixed anxiety and depressed mood: Secondary | ICD-10-CM | POA: Diagnosis not present

## 2024-11-21 DIAGNOSIS — F3342 Major depressive disorder, recurrent, in full remission: Secondary | ICD-10-CM | POA: Diagnosis not present

## 2024-11-21 DIAGNOSIS — F411 Generalized anxiety disorder: Secondary | ICD-10-CM

## 2024-11-21 DIAGNOSIS — F902 Attention-deficit hyperactivity disorder, combined type: Secondary | ICD-10-CM

## 2024-11-24 NOTE — Progress Notes (Signed)
 " [facility] Counselor/Therapist Progress Note  Patient ID: Jonathan [name], MRN: [id number],    Date:11/21/2024  Time Spent: 56 minutes  Time in:  10:09  Time out: 11:05  Treatment Type: Individual Therapy  Reported Symptoms: sadness, inability to pay attention  Mental Status Exam: Appearance:  Casual     Behavior: Appropriate  Motor: Normal  Speech/Language:  Normal Rate  Affect: Appropriate  Mood: pleasant  Thought process: normal  Thought content:   WNL  Sensory/Perceptual disturbances:   WNL  Orientation: oriented to person, place, time/date, and situation  Attention: Good  Concentration: Good  Memory: WNL  Fund of knowledge:  Good  Insight:   Good  Judgment:  Good  Impulse Control: Good   Risk Assessment: Danger to Self:  No Self-injurious Behavior: No Danger to Others: No Duty to Warn:no Physical Aggression / Violence:No  Access to Firearms a concern: No  Gang Involvement:No   Subjective: The patient attended a face-to-face individual therapy session in the office today.  The patient presents as pleasant and cooperative.  The patient talked about a situation that was happening at his church that seem to cause some anxiety for everyone.  He explained that it was a bit of a volatile situation and I made a suggestion that when he has the next meeting that he potentially needs to have the share of their just to make sure that people do not get out of control.  The patient did have some angst about it and he seemed to be feeling better when he left the office as he felt like he had a little more direction.  I told him that he could certainly call me if he needed to and we could problem solve this situation if need be.  The patient also is planning to retire at the end of this month and this is causing some angst around that.    Interventions: Cognitive Behavioral Therapy and Insight-Oriented  Diagnosis:Recurrent major depressive disorder, in full  remission  Attention deficit hyperactivity disorder (ADHD), combined type  Generalized anxiety disorder  Adjustment disorder with mixed anxiety and depressed mood  Plan: Client Abilities/Strengths  Intelligent, insightful, motivated  Client Treatment Preferences  Outpatient individual therapy  Client Statement of Needs  I decided I needed therapy to help me sort through a situation I am dealing with  Treatment Level  Outpatient Individual therapy  Symptoms  Increased defensiveness with his wife: (Status: improved). Passive -  aggressive behaviors: (Status: improved).  Problems Addressed  adjustment disorder, Vocational Stress, Vocational Stress, Vocational Stress, Vocational Stress,  Vocational Stress  Goals 1. Decrease incidents of exhibiting passive aggressive behavior Objective Learn and implement problem-solving skills. Target Date: 2025-09-15 Frequency: Monthly Progress: 90 Modality: individual Related Interventions 1. Conduct Problem-Solving Therapy (see Problem-Solving Therapy by Francisco and Nezu) using techniques such as psychoeducation, modeling, and role-playing to teach the client problem solving skills (i.e., defining a problem specifically, generating possible solutions, evaluating the  pros and cons of each solution, selecting and implementing a plan of action, evaluating the  efficacy of the plan, accepting or revising the plan); role-play application of the problemsolving skill to a real life issue (or assign Applying Problem-Solving to Interpersonal Conflict in the Adult Psychotherapy Homework Planner by Jenniffer). 2. Improve satisfaction and comfort surrounding coworker relationships. 3. Increase job satisfaction and performance due to implementation of  assertiveness and stress management strategies. 4. Increase sense of confidence and competence in dealing with work  responsibilities. Objective Verbalize healthy, realistic  cognitive messages that  promote harmony with others, self-acceptance, and  self-confidence. Target Date: 2025-09-15 Frequency: Monthly Progress: 90 Modality: individual 5. Increase sense of self-esteem and elevation of mood in spite of  unemployment. 6. Pursue employment consistency with a reasonably hopeful and positive attitude. 7.  Deal with lifes issues by gaining wisdom and knowledge and implementing wisdom gained.  Diagnosis Adult ADD 296.31 (Major depressive affective disorder, recurrent episode in full remission) - 309.28 (Adjustment disorder with mixed anxiety and depressed mood) - Conditions For Discharge Achievement of treatment goals and objectives  The patient approved this plan and is making progress.  Edenilson Austad G Kadesia Robel, LCSW                                                                                           "

## 2024-12-09 ENCOUNTER — Encounter: Payer: Self-pay | Admitting: Adult Health

## 2024-12-09 ENCOUNTER — Telehealth: Payer: Self-pay | Admitting: Adult Health

## 2024-12-09 DIAGNOSIS — F3342 Major depressive disorder, recurrent, in full remission: Secondary | ICD-10-CM

## 2024-12-09 DIAGNOSIS — F419 Anxiety disorder, unspecified: Secondary | ICD-10-CM | POA: Diagnosis not present

## 2024-12-09 DIAGNOSIS — F909 Attention-deficit hyperactivity disorder, unspecified type: Secondary | ICD-10-CM | POA: Diagnosis not present

## 2024-12-09 DIAGNOSIS — F329 Major depressive disorder, single episode, unspecified: Secondary | ICD-10-CM | POA: Diagnosis not present

## 2024-12-09 DIAGNOSIS — F411 Generalized anxiety disorder: Secondary | ICD-10-CM

## 2024-12-09 DIAGNOSIS — F902 Attention-deficit hyperactivity disorder, combined type: Secondary | ICD-10-CM

## 2024-12-09 MED ORDER — METHYLPHENIDATE HCL ER (OSM) 18 MG PO TBCR: 18.0000 mg | EXTENDED_RELEASE_TABLET | Freq: Every day | ORAL | 0 refills | Status: DC

## 2024-12-09 NOTE — Progress Notes (Signed)
 Jonathan [name] [id number] March 28, 1961 64 y.o.  Virtual Visit via Video Note  I connected with pt @ on 12/09/2024 at 11:00 AM EST by a video enabled telemedicine application and verified that I am speaking with the correct person using two identifiers.   I discussed the limitations of evaluation and management by telemedicine and the availability of in person appointments. The patient expressed understanding and agreed to proceed.  I discussed the assessment and treatment plan with the patient. The patient was provided an opportunity to ask questions and all were answered. The patient agreed with the plan and demonstrated an understanding of the instructions.   The patient was advised to call back or seek an in-person evaluation if the symptoms worsen or if the condition fails to improve as anticipated.  I provided 20 minutes of non-face-to-face time during this encounter.  The patient was located at home.  The provider was located at [facility].   Angeline LOISE Sayers, NP   Subjective:   Patient ID:  Jonathan [name] is a 64 y.o. (DOB [birth year]) male.  Chief Complaint: No chief complaint on file.   HPI Jonathan [name] presents for follow-up of ADHD, anxiety and depression.   Previously seen by Harlene Pepper.   Describes mood today as ok. Pleasant. Denies tearfulness. Mood symptoms - denies anxiety, depression and irritability. Reports improved interest and motivation. Denies panic attacks. Reports some over thinking. Denies worry and rumination. Denies obsessive thoughts and acts. Reports mood is improved. Reports recent retirement. Stating I feel like I'm doing alright. Taking medications as prescribed. Feels like current medication regimen works well. Energy levels improved. Active, does not have a regular exercise routine - walking. Enjoys some usual interests and activities. Spending time with family. Appetite adequate. Weight loss.   Reports sleeping better some  nights than others. Averages 5 to 6 or more hours of interrupted sleep. Reports some daytime napping. Focus and concentration improved. Completing tasks. Managing aspects of household. Retired Scientist, [employer].  Denies SI or HI.  Denies AH or VH. Denies self harm. Denies substance use.  Past Psychiatric Medication Trials: Citalopram - Sexual side effects, fatigue. Partially effective for depression. Increased to 15 mg in the last month and noticed more fatigue. He reports that he has been taking 10 mg most of the time since then.  Trintellix - Improved mood, anxiety, and focus on 5 mg. Excessive somnolence on 10 mg. Pristiq  Wellbutrin - Took 25 years ago. Cannot recall response. Concerta - Effective. Raised BP. Caused jitteriness.  Modafinil -Helpful for concentration. Caused some edginess  Review of Systems:  Review of Systems  Musculoskeletal:  Negative for gait problem.  Neurological:  Negative for tremors.  Psychiatric/Behavioral:         Please refer to HPI    Medications: I have reviewed the patient's current medications.  Current Outpatient Medications  Medication Sig Dispense Refill   ALPRAZolam  (XANAX ) 0.5 MG tablet Take 1/2-1 tab as needed every 4-6 hours as needed for severe anxiety and panic 30 tablet 2   azelastine  (OPTIVAR ) 0.05 % ophthalmic solution Place 1 drop into the left eye 2 (two) times daily as needed (eye redness, itching, irritation). (Patient not taking: Reported on 07/23/2024) 6 mL 0   buPROPion  (WELLBUTRIN  SR) 150 MG 12 hr tablet Take 1 tablet (150 mg total) by mouth daily in the afternoon. 90 tablet 1   buPROPion  (WELLBUTRIN  XL) 150 MG 24 hr tablet Take 1 tablet (150 mg total) by mouth daily. 90 tablet 1  fluticasone  (FLONASE ) 50 MCG/ACT nasal spray Place 1 spray into both nostrils daily. Begin by using 2 sprays in each nare daily for 3 to 5 days, then decrease to 1 spray in each nare daily. (Patient not taking: Reported on 07/23/2024) 32 mL 1    gabapentin  (NEURONTIN ) 100 MG capsule Take 100 mg by mouth in the morning, at noon, and at bedtime.     lidocaine  (LIDODERM ) 5 % Place 1 patch onto the skin daily. Remove & Discard patch within 12 hours or as directed by MD (Patient not taking: Reported on 07/23/2024) 30 patch 0   losartan -hydrochlorothiazide  (HYZAAR) 50-12.5 MG tablet TAKE 2 TABLETS BY MOUTH EVERY DAY 180 tablet 3   meloxicam  (MOBIC ) 7.5 MG tablet Take 7.5 mg by mouth daily. (Patient not taking: Reported on 07/23/2024)     metFORMIN  (GLUCOPHAGE -XR) 500 MG 24 hr tablet TAKE 3 TABLETS (1,500 MG TOTAL) BY MOUTH DAILY 270 tablet 1   methocarbamol (ROBAXIN) 500 MG tablet Take 500 mg by mouth in the morning and at bedtime. (Patient not taking: Reported on 07/23/2024)     methylphenidate  (CONCERTA ) 18 MG PO CR tablet Take 1 tablet (18 mg total) by mouth daily. 30 tablet 0   oxyCODONE -acetaminophen  (PERCOCET/ROXICET) 5-325 MG tablet Take 1 tablet by mouth every 6 (six) hours as needed for severe pain (pain score 7-10). (Patient not taking: Reported on 07/23/2024) 10 tablet 0   rosuvastatin  (CRESTOR ) 5 MG tablet TAKE 5 MG BY MOUTH 5 DAYS PER WEEK FOR 2 WEEKS AND THEN INCREASE TO 5 MG 7 DAYS PER WEEK AS TOLERATED 90 tablet 3   tamsulosin  (FLOMAX ) 0.4 MG CAPS capsule Take 0.4 mg by mouth daily.     Vilazodone  HCl 20 MG TABS TAKE 1 TABLET BY MOUTH DAILY WITH BREAKFAST AND 0.5 TABLETS (10 MG TOTAL) DAILY WITH SUPPER. 135 tablet 1   No current facility-administered medications for this visit.    Medication Side Effects: None  Allergies: Allergies[1]  Past Medical History:  Diagnosis Date   Allergic rhinitis    Allergy    Anxiety    Depression    Diabetes mellitus without complication (HCC)    type 2   Heart murmur    per pt told it was functional   History of kidney stones    History of nephrolithiasis    Hyperlipidemia    Hypertension    Sebaceous cyst     Family History  Problem Relation Age of Onset   Diabetes Father     Hypertension Father    Kidney cancer Father        now on dialysis   Kidney Stones Father    Kidney Stones Brother    Huntington's disease Maternal Grandfather    Depression Daughter    Anxiety disorder Daughter    ADD / ADHD Daughter    Colon polyps Daughter 5   Colon cancer Neg Hx    Esophageal cancer Neg Hx    Stomach cancer Neg Hx    Rectal cancer Neg Hx     Social History   Socioeconomic History   Marital status: Married    Spouse name: Not on file   Number of children: 1   Years of education: 18   Highest education level: Not on file  Occupational History   Occupation: MINISTER    Employer: [employer]  Tobacco Use   Smoking status: Never   Smokeless tobacco: Never  Vaping Use   Vaping status: Never Used  Substance  and Sexual Activity   Alcohol use: Yes    Comment: less than 1 a week   Drug use: No   Sexual activity: Yes  Other Topics Concern   Not on file  Social History Narrative   Married   Regular exercise- no   HH of 3   Dog car and lizard   Sleep wakening at times   New job [employer]   Per week reg sleep.    Social Drivers of Health   Tobacco Use: Low Risk (11/04/2024)   Patient History    Smoking Tobacco Use: Never    Smokeless Tobacco Use: Never    Passive Exposure: Not on file  Financial Resource Strain: Not on file  Food Insecurity: Not on file  Transportation Needs: Not on file  Physical Activity: Not on file  Stress: Not on file  Social Connections: Unknown (04/19/2022)   Received from [facility]   Social Network    Social Network: Not on file  Intimate Partner Violence: Unknown (03/11/2022)   Received from [facility]   HITS    Physically Hurt: Not on file    Insult or Talk Down To: Not on file    Threaten Physical Harm: Not on file    Scream or Curse: Not on file  Depression (PHQ2-9): Medium Risk (06/20/2024)   Depression (PHQ2-9)    PHQ-2 Score: 7  Alcohol Screen: Not on file  Housing: Not on file   Utilities: Not on file  Health Literacy: Not on file    Past Medical History, Surgical history, Social history, and Family history were reviewed and updated as appropriate.   Please see review of systems for further details on the patient's review from today.   Objective:   Physical Exam:  There were no vitals taken for this visit.  Physical Exam Constitutional:      General: He is not in acute distress. Musculoskeletal:        General: No deformity.  Neurological:     Mental Status: He is alert and oriented to person, place, and time.     Coordination: Coordination normal.  Psychiatric:        Attention and Perception: Attention and perception normal. He does not perceive auditory or visual hallucinations.        Mood and Affect: Mood normal. Mood is not anxious or depressed. Affect is not labile, blunt, angry or inappropriate.        Speech: Speech normal.        Behavior: Behavior normal.        Thought Content: Thought content normal. Thought content is not paranoid or delusional. Thought content does not include homicidal or suicidal ideation. Thought content does not include homicidal or suicidal plan.        Cognition and Memory: Cognition and memory normal.        Judgment: Judgment normal.     Comments: Insight intact     Lab Review:     Component Value Date/Time   NA 142 07/09/2024 0838   NA 141 08/17/2021 0000   K 4.0 07/09/2024 0838   CL 107 07/09/2024 0838   CO2 27 07/09/2024 0838   GLUCOSE 119 (H) 07/09/2024 0838   BUN 14 07/09/2024 0838   CREATININE 1.18 07/09/2024 0838   CREATININE 1.15 10/19/2020 1206   CALCIUM  9.0 07/09/2024 0838   PROT 6.1 07/09/2024 0838   ALBUMIN 4.1 07/09/2024 0838   AST 15 07/09/2024 0838   ALT 21 07/09/2024 9161  ALKPHOS 59 07/09/2024 0838   BILITOT 0.5 07/09/2024 0838   GFRNONAA 53 (L) 05/27/2024 2125   GFRNONAA 68 10/19/2020 1206   GFRAA 83.5 08/17/2021 0000   GFRAA 79 10/19/2020 1206       Component Value  Date/Time   WBC 9.8 07/09/2024 0838   RBC 5.15 07/09/2024 0838   HGB 16.0 07/09/2024 0838   HCT 47.3 07/09/2024 0838   PLT 243.0 07/09/2024 0838   MCV 91.8 07/09/2024 0838   MCV 94.2 04/29/2013 0936   MCH 31.0 05/27/2024 2125   MCHC 33.9 07/09/2024 0838   RDW 13.4 07/09/2024 0838   LYMPHSABS 2.3 07/09/2024 0838   MONOABS 1.1 (H) 07/09/2024 0838   EOSABS 1.1 (H) 07/09/2024 0838   BASOSABS 0.1 07/09/2024 0838    No results found for: POCLITH, LITHIUM   No results found for: PHENYTOIN, PHENOBARB, VALPROATE, CBMZ   .res Assessment: Plan:    Plan:  Continue: Concerta  - 18mg  every morning - BP WNL per patient - will continue for now. Wellbutrin  SR 150 mg daily in the afternoon for mood and off-label indication of ADHD.  Wellbutrin  XL150 mg daily in the morning for depression.  Viibryd  20 mg in the morning and 10 mg daily in the afternoon for anxiety and depression - discussed possible taper.  Alprazolam  0.5 mg 1/2-1 tab as needed for anxiety and panic.   Recommend continuing therapy with Bambi Cottle, LCSW.  RTC 4 weeks  20 minutes spent dedicated to the care of this patient on the date of this encounter to include pre-visit review of records, ordering of medication, post visit documentation, and face-to-face time with the patient discussing current medication regimen.   Discussed potential benefits, risk, and side effects of benzodiazepines to include potential risk of tolerance and dependence, as well as possible drowsiness.  Advised patient not to drive if experiencing drowsiness and to take lowest possible effective dose to minimize risk of dependence and tolerance.  Discussed potential benefits, risks, and side effects of stimulants with patient to include increased heart rate, palpitations, insomnia, increased anxiety, increased irritability, or decreased appetite.  Instructed patient to contact office if experiencing any significant tolerability issues.    Patient advised to contact office with any questions, adverse effects, or acute worsening in signs and symptoms.  There are no diagnoses linked to this encounter.   Please see After Visit Summary for patient specific instructions.  Future Appointments  Date Time Provider Department Center  12/09/2024 11:00 AM Daichi Moris Nattalie, NP [facility] None  12/18/2024  9:45 AM Panosh, Apolinar POUR, MD [id number] [address]  01/02/2025 10:00 AM Cottle, Bambi G, LCSW [id number] None    No orders of the defined types were placed in this encounter.     -------------------------------     [1]  Allergies Allergen Reactions   Sodium Nitrite Nausea Only   Statins Other (See Comments)    Myalgias      Simvastatin Other (See Comments)    Myalgias and arthralgia (joint pain)   Penicillins Hives and Other (See Comments)    Per pt - happened in childhood - hives possible reaction

## 2024-12-18 ENCOUNTER — Ambulatory Visit: Admitting: Internal Medicine

## 2024-12-18 VITALS — BP 128/80 | HR 65 | Temp 97.8°F | Ht 68.0 in | Wt 195.2 lb

## 2024-12-18 DIAGNOSIS — Z79899 Other long term (current) drug therapy: Secondary | ICD-10-CM | POA: Diagnosis not present

## 2024-12-18 DIAGNOSIS — Z7984 Long term (current) use of oral hypoglycemic drugs: Secondary | ICD-10-CM | POA: Diagnosis not present

## 2024-12-18 DIAGNOSIS — E785 Hyperlipidemia, unspecified: Secondary | ICD-10-CM | POA: Diagnosis not present

## 2024-12-18 DIAGNOSIS — I10 Essential (primary) hypertension: Secondary | ICD-10-CM

## 2024-12-18 DIAGNOSIS — E119 Type 2 diabetes mellitus without complications: Secondary | ICD-10-CM | POA: Diagnosis not present

## 2024-12-18 LAB — LIPID PANEL
Cholesterol: 211 mg/dL — ABNORMAL HIGH (ref 28–200)
HDL: 51.8 mg/dL
LDL Cholesterol: 141 mg/dL — ABNORMAL HIGH (ref 10–99)
NonHDL: 159.15
Total CHOL/HDL Ratio: 4
Triglycerides: 92 mg/dL (ref 10.0–149.0)
VLDL: 18.4 mg/dL (ref 0.0–40.0)

## 2024-12-18 LAB — POCT GLYCOSYLATED HEMOGLOBIN (HGB A1C): Hemoglobin A1C: 6.4 % — AB (ref 4.0–5.6)

## 2024-12-18 LAB — HEPATIC FUNCTION PANEL
ALT: 17 U/L (ref 3–53)
AST: 19 U/L (ref 5–37)
Albumin: 4.4 g/dL (ref 3.5–5.2)
Alkaline Phosphatase: 58 U/L (ref 39–117)
Bilirubin, Direct: 0.1 mg/dL (ref 0.1–0.3)
Total Bilirubin: 0.7 mg/dL (ref 0.2–1.2)
Total Protein: 6.9 g/dL (ref 6.0–8.3)

## 2024-12-18 MED ORDER — LOSARTAN POTASSIUM-HCTZ 50-12.5 MG PO TABS: 1.0000 | ORAL_TABLET | Freq: Every day | ORAL | 1 refills | Status: AC

## 2024-12-18 NOTE — Progress Notes (Signed)
 "  Chief Complaint  Patient presents with   Medical Management of Chronic Issues    Pt is here for 5months f/u. Pt reports losartan  with taking one tablet works better for him. When accidentally took 2 tablets, felt his BP reading low. He is retired this month.     HPI: Jonathan [name] 64 y.o. come in for Chronic disease management   DM: doing well  attention to diet  more active since retire this month  less stress   no neuro sx  HT:  seems to be  in control since retirement  down to one per day Concerta  :  helps w [employer]   .  And better overthinking .  And help with  appetite and weight loss  Sleep   hard to fall back asleep when up for  nocturia  sees counselor doing ok  [facility]  [facility]  stopped rosuva  in summer ? If after last check  no se   ( did have se with simva)  Had eval for back pain  doing exercises to help ROS: See pertinent positives and negatives per HPI. Denes cp sob    Past Medical History:  Diagnosis Date   Allergic rhinitis    Allergy    Anxiety    Depression    Diabetes mellitus without complication (HCC)    type 2   Heart murmur    per pt told it was functional   History of kidney stones    History of nephrolithiasis    Hyperlipidemia    Hypertension    Sebaceous cyst     Family History  Problem Relation Age of Onset   Diabetes Father    Hypertension Father    Kidney cancer Father        now on dialysis   Kidney Stones Father    Kidney Stones Brother    Huntington's disease Maternal Grandfather    Depression Daughter    Anxiety disorder Daughter    ADD / ADHD Daughter    Colon polyps Daughter 25   Colon cancer Neg Hx    Esophageal cancer Neg Hx    Stomach cancer Neg Hx    Rectal cancer Neg Hx     Social History   Socioeconomic History   Marital status: Married    Spouse name: Not on file   Number of children: 1   Years of education: 18   Highest education level: Master's degree (e.g., MA, MS, MEng, MEd, MSW,  MBA)  Occupational History   Occupation: MINISTER    Employer: [employer]  Tobacco Use   Smoking status: Never   Smokeless tobacco: Never  Vaping Use   Vaping status: Never Used  Substance and Sexual Activity   Alcohol use: Yes    Comment: less than 1 a week   Drug use: No   Sexual activity: Yes  Other Topics Concern   Not on file  Social History Narrative   Married   Regular exercise- no   HH of 3   Dog car and lizard   Sleep wakening at times   [employer] job [employer]   Per week reg sleep.    Social Drivers of Health   Tobacco Use: Low Risk (12/18/2024)   Patient History    Smoking Tobacco Use: Never    Smokeless Tobacco Use: Never    Passive Exposure: Not on file  Financial Resource Strain: Low Risk (12/18/2024)   Overall  Financial Resource Strain (CARDIA)    Difficulty of Paying Living Expenses: Not hard at all  Food Insecurity: No Food Insecurity (12/18/2024)   Epic    Worried About Programme Researcher, Broadcasting/film/video in the Last Year: Never true    Ran Out of Food in the Last Year: Never true  Transportation Needs: No Transportation Needs (12/18/2024)   Epic    Lack of Transportation (Medical): No    Lack of Transportation (Non-Medical): No  Physical Activity: Insufficiently Active (12/18/2024)   Exercise Vital Sign    Days of Exercise per Week: 4 days    Minutes of Exercise per Session: 20 min  Stress: No Stress Concern Present (12/18/2024)   [employer] of Occupational Health - Occupational Stress Questionnaire    Feeling of Stress: Only a little  Social Connections: Moderately Isolated (12/18/2024)   Social Connection and Isolation Panel    Frequency of Communication with Friends and Family: More than three times a week    Frequency of Social Gatherings with Friends and Family: Three times a week    Attends Religious Services: Patient declined    Active Member of Clubs or Organizations: No    Attends Banker Meetings: Not on file    Marital  Status: Married  Depression (PHQ2-9): Low Risk (12/18/2024)   Depression (PHQ2-9)    PHQ-2 Score: 3  Alcohol Screen: Low Risk (12/18/2024)   Alcohol Screen    Last Alcohol Screening Score (AUDIT): 1  Housing: Low Risk (12/18/2024)   Epic    Unable to Pay for Housing in the Last Year: No    Number of Times Moved in the Last Year: 0    Homeless in the Last Year: No  Utilities: Not on file  Health Literacy: Not on file    Outpatient Medications Prior to Visit  Medication Sig Dispense Refill   ALPRAZolam  (XANAX ) 0.5 MG tablet Take 1/2-1 tab as needed every 4-6 hours as needed for severe anxiety and panic 30 tablet 2   buPROPion  (WELLBUTRIN  SR) 150 MG 12 hr tablet Take 1 tablet (150 mg total) by mouth daily in the afternoon. 90 tablet 1   buPROPion  (WELLBUTRIN  XL) 150 MG 24 hr tablet Take 1 tablet (150 mg total) by mouth daily. 90 tablet 1   gabapentin  (NEURONTIN ) 100 MG capsule Take 100 mg by mouth in the morning, at noon, and at bedtime.     metFORMIN  (GLUCOPHAGE -XR) 500 MG 24 hr tablet TAKE 3 TABLETS (1,500 MG TOTAL) BY MOUTH DAILY 270 tablet 1   methylphenidate  (CONCERTA ) 18 MG PO CR tablet Take 1 tablet (18 mg total) by mouth daily. 30 tablet 0   tamsulosin  (FLOMAX ) 0.4 MG CAPS capsule Take 0.4 mg by mouth daily.     Vilazodone  HCl 20 MG TABS TAKE 1 TABLET BY MOUTH DAILY WITH BREAKFAST AND 0.5 TABLETS (10 MG TOTAL) DAILY WITH SUPPER. 135 tablet 1   losartan -hydrochlorothiazide  (HYZAAR) 50-12.5 MG tablet TAKE 2 TABLETS BY MOUTH EVERY DAY (Patient taking differently: Take 1 tablet by mouth daily.) 180 tablet 3   azelastine  (OPTIVAR ) 0.05 % ophthalmic solution Place 1 drop into the left eye 2 (two) times daily as needed (eye redness, itching, irritation). (Patient not taking: Reported on 12/18/2024) 6 mL 0   fluticasone  (FLONASE ) 50 MCG/ACT nasal spray Place 1 spray into both nostrils daily. Begin by using 2 sprays in each nare daily for 3 to 5 days, then decrease to 1 spray in each nare daily.  (Patient not taking:  Reported on 12/18/2024) 32 mL 1   lidocaine  (LIDODERM ) 5 % Place 1 patch onto the skin daily. Remove & Discard patch within 12 hours or as directed by MD (Patient not taking: Reported on 12/18/2024) 30 patch 0   methocarbamol (ROBAXIN) 500 MG tablet Take 500 mg by mouth in the morning and at bedtime. (Patient not taking: Reported on 12/18/2024)     rosuvastatin  (CRESTOR ) 5 MG tablet TAKE 5 MG BY MOUTH 5 DAYS PER WEEK FOR 2 WEEKS AND THEN INCREASE TO 5 MG 7 DAYS PER WEEK AS TOLERATED (Patient not taking: Reported on 12/18/2024) 90 tablet 3   meloxicam  (MOBIC ) 7.5 MG tablet Take 7.5 mg by mouth daily. (Patient not taking: Reported on 12/18/2024)     oxyCODONE -acetaminophen  (PERCOCET/ROXICET) 5-325 MG tablet Take 1 tablet by mouth every 6 (six) hours as needed for severe pain (pain score 7-10). (Patient not taking: Reported on 12/18/2024) 10 tablet 0   No facility-administered medications prior to visit.     EXAM:  BP 128/80 (BP Location: Right Arm, Patient Position: Sitting, Cuff Size: Large)   Pulse 65   Temp 97.8 F (36.6 C) (Oral)   Ht 5' 8 (1.727 m)   Wt 195 lb 3.2 oz (88.5 kg)   SpO2 96%   BMI 29.68 kg/m   Body mass index is 29.68 kg/m. Wt Readings from Last 3 Encounters:  12/18/24 195 lb 3.2 oz (88.5 kg)  09/02/24 206 lb (93.4 kg)  08/30/24 206 lb (93.4 kg)    GENERAL: vitals reviewed and listed above, alert, oriented, appears well hydrated and in no acute distress no cough some thrat clearing  HEENT: atraumatic, conjunctiva  clear, no obvious abnormalities on inspection of external nose and ears OP : no lesion edema or exudate  NECK: no obvious masses on inspection palpation  LUNGS: clear to auscultation bilaterally, no wheezes, rales or rhonchi, good air movement CV: HRRR, no clubbing cyanosis or  peripheral edema nl cap refill  MS: moves all extremities without noticeable focal  abnormality PSYCH: pleasant and cooperative, no obvious depression or  anxiety Lab Results  Component Value Date   WBC 9.8 07/09/2024   HGB 16.0 07/09/2024   HCT 47.3 07/09/2024   PLT 243.0 07/09/2024   GLUCOSE 119 (H) 07/09/2024   CHOL 157 07/09/2024   TRIG 88.0 07/09/2024   HDL 54.80 07/09/2024   LDLDIRECT 209.0 03/30/2022   LDLCALC 85 07/09/2024   ALT 21 07/09/2024   AST 15 07/09/2024   NA 142 07/09/2024   K 4.0 07/09/2024   CL 107 07/09/2024   CREATININE 1.18 07/09/2024   BUN 14 07/09/2024   CO2 27 07/09/2024   TSH 1.22 07/09/2024   PSA 0.92 07/09/2024   HGBA1C 6.4 (A) 12/18/2024   MICROALBUR <0.7 07/09/2024   BP Readings from Last 3 Encounters:  12/18/24 128/80  09/02/24 130/81  08/30/24 (!) 150/85  Record review   ASSESSMENT AND PLAN:  Discussed the following assessment and plan:  Hyperlipidemia, unspecified hyperlipidemia type - Plan: Lipid panel, Lipoprotein A (LPA), Hepatic function panel  Essential hypertension - Plan: Lipid panel, Lipoprotein A (LPA), Hepatic function panel  Medication management - Plan: Lipid panel, Lipoprotein A (LPA), Hepatic function panel  Diabetes mellitus with coincident hypertension (HCC) - Plan: POC HgB A1c, Lipid panel, Lipoprotein A (LPA), Hepatic function panel Since off statin and prefer to be off   check flp and lipo a today . Disc goals  and  may be should go back on med even  is lower dosing  to reach goal  Otherwise  LSI has also been helping and retirement  Fu 6 mos or August time for yearly   will be on medicare advantage at that time Can discuss optimizing sleep habits with counselor as needed  -Patient advised to return or notify health care team  if  new concerns arise.  Patient Instructions  Good to see you today   A1c is 6.4  Wt Readings from Last 3 Encounters:  12/18/24 195 lb 3.2 oz (88.5 kg)  09/02/24 206 lb (93.4 kg)  08/30/24 206 lb (93.4 kg)   Continue lifestyle intervention healthy eating and exercise .   Repeat lipids off meds  Goal is usually ldl of 70 or below  .  Should consider going back on rosuvastatin  if not at goal  althoguh could use lower dosing  and titrate.    Cynia Abruzzo K. Neale Marzette M.D. "

## 2024-12-18 NOTE — Patient Instructions (Signed)
 Good to see you today   A1c is 6.4  Wt Readings from Last 3 Encounters:  12/18/24 195 lb 3.2 oz (88.5 kg)  09/02/24 206 lb (93.4 kg)  08/30/24 206 lb (93.4 kg)   Continue lifestyle intervention healthy eating and exercise .   Repeat lipids off meds  Goal is usually ldl of 70 or below .  Should consider going back on rosuvastatin  if not at goal  althoguh could use lower dosing  and titrate.

## 2024-12-25 ENCOUNTER — Ambulatory Visit: Payer: Self-pay | Admitting: Internal Medicine

## 2024-12-25 LAB — LIPOPROTEIN A (LPA): Lipoprotein (a): <10 nmol/L

## 2024-12-25 NOTE — Progress Notes (Signed)
 So cholesterol is back up again  off the statin medication. I think you should go back on the rosuvastatin  since it worked so well.

## 2025-01-02 ENCOUNTER — Ambulatory Visit: Admitting: Psychology

## 2025-01-09 ENCOUNTER — Telehealth: Payer: Self-pay | Admitting: Adult Health

## 2025-01-09 ENCOUNTER — Encounter: Payer: Self-pay | Admitting: Adult Health

## 2025-01-09 DIAGNOSIS — F902 Attention-deficit hyperactivity disorder, combined type: Secondary | ICD-10-CM

## 2025-01-09 DIAGNOSIS — F419 Anxiety disorder, unspecified: Secondary | ICD-10-CM

## 2025-01-09 DIAGNOSIS — F909 Attention-deficit hyperactivity disorder, unspecified type: Secondary | ICD-10-CM | POA: Diagnosis not present

## 2025-01-09 DIAGNOSIS — F32A Depression, unspecified: Secondary | ICD-10-CM

## 2025-01-09 DIAGNOSIS — F3342 Major depressive disorder, recurrent, in full remission: Secondary | ICD-10-CM

## 2025-01-09 DIAGNOSIS — F410 Panic disorder [episodic paroxysmal anxiety] without agoraphobia: Secondary | ICD-10-CM

## 2025-01-09 MED ORDER — METHYLPHENIDATE HCL ER (OSM) 18 MG PO TBCR: 18.0000 mg | EXTENDED_RELEASE_TABLET | Freq: Every day | ORAL | 0 refills | Status: AC

## 2025-01-09 MED ORDER — VILAZODONE HCL 20 MG PO TABS: ORAL_TABLET | ORAL | 1 refills | Status: AC

## 2025-01-09 NOTE — Progress Notes (Signed)
 Jonathan [name] [id number] Jul 31, 1961 64 y.o.  Virtual Visit via Video Note  I connected with pt @ on 01/09/25 at 10:00 AM EST by a video enabled telemedicine application and verified that I am speaking with the correct person using two identifiers.   I discussed the limitations of evaluation and management by telemedicine and the availability of in person appointments. The patient expressed understanding and agreed to proceed.  I discussed the assessment and treatment plan with the patient. The patient was provided an opportunity to ask questions and all were answered. The patient agreed with the plan and demonstrated an understanding of the instructions.   The patient was advised to call back or seek an in-person evaluation if the symptoms worsen or if the condition fails to improve as anticipated.  I provided 25 minutes of non-face-to-face time during this encounter.  The patient was located at home.  The provider was located at [facility].   Angeline LOISE Sayers, NP   Subjective:   Patient ID:  Jonathan [name] is a 64 y.o. (DOB 1961/07/30) male.  Chief Complaint: No chief complaint on file.   HPI JOZSEF WESCOAT presents for follow-up of ADHD, anxiety and depression.   Previously seen by Harlene Pepper.   Describes mood today as ok. Pleasant. Denies tearfulness. Mood symptoms - denies anxiety and depression. Reports situational irritability. Reports stable interest and motivation. Denies panic attacks. Reports some over thinking and rumination. Denies worry. Denies obsessive thoughts and acts. Reports mood is stable.  Stating I feel like I'm doing ok. Taking medications as prescribed. Feels like current medication regimen works well. Energy levels stable. Active, has a regular exercise routine - Tai Chi walking. Enjoys some usual interests and activities. Spending time with family. Appetite adequate. Weight loss - 9 pounds.   Reports sleeping better some nights  than others. Averages 7 to 8 hours. Reports decreased daytime napping. Focus and concentration improved. Completing tasks. Managing aspects of household. Retired Scientist, [employer].  Denies SI or HI.  Denies AH or VH. Denies self harm. Denies substance use.  Past Psychiatric Medication Trials: Citalopram - Sexual side effects, fatigue. Partially effective for depression. Increased to 15 mg in the last month and noticed more fatigue. He reports that he has been taking 10 mg most of the time since then.  Trintellix - Improved mood, anxiety, and focus on 5 mg. Excessive somnolence on 10 mg. Pristiq  Wellbutrin - Took 25 years ago. Cannot recall response. Concerta - Effective. Raised BP. Caused jitteriness.  Modafinil -Helpful for concentration. Caused some edginess    Review of Systems:  Review of Systems  Musculoskeletal:  Negative for gait problem.  Neurological:  Negative for tremors.  Psychiatric/Behavioral:         Please refer to HPI    Medications: I have reviewed the patient's current medications.  Current Outpatient Medications  Medication Sig Dispense Refill   ALPRAZolam  (XANAX ) 0.5 MG tablet Take 1/2-1 tab as needed every 4-6 hours as needed for severe anxiety and panic 30 tablet 2   azelastine  (OPTIVAR ) 0.05 % ophthalmic solution Place 1 drop into the left eye 2 (two) times daily as needed (eye redness, itching, irritation). (Patient not taking: Reported on 12/18/2024) 6 mL 0   buPROPion  (WELLBUTRIN  SR) 150 MG 12 hr tablet Take 1 tablet (150 mg total) by mouth daily in the afternoon. 90 tablet 1   buPROPion  (WELLBUTRIN  XL) 150 MG 24 hr tablet Take 1 tablet (150 mg total) by mouth daily. 90 tablet 1  fluticasone  (FLONASE ) 50 MCG/ACT nasal spray Place 1 spray into both nostrils daily. Begin by using 2 sprays in each nare daily for 3 to 5 days, then decrease to 1 spray in each nare daily. (Patient not taking: Reported on 12/18/2024) 32 mL 1   gabapentin  (NEURONTIN ) 100 MG  capsule Take 100 mg by mouth in the morning, at noon, and at bedtime.     lidocaine  (LIDODERM ) 5 % Place 1 patch onto the skin daily. Remove & Discard patch within 12 hours or as directed by MD (Patient not taking: Reported on 12/18/2024) 30 patch 0   losartan -hydrochlorothiazide  (HYZAAR) 50-12.5 MG tablet Take 1 tablet by mouth daily. 90 tablet 1   metFORMIN  (GLUCOPHAGE -XR) 500 MG 24 hr tablet TAKE 3 TABLETS (1,500 MG TOTAL) BY MOUTH DAILY 270 tablet 1   methocarbamol (ROBAXIN) 500 MG tablet Take 500 mg by mouth in the morning and at bedtime. (Patient not taking: Reported on 12/18/2024)     methylphenidate  (CONCERTA ) 18 MG PO CR tablet Take 1 tablet (18 mg total) by mouth daily. 30 tablet 0   rosuvastatin  (CRESTOR ) 5 MG tablet TAKE 5 MG BY MOUTH 5 DAYS PER WEEK FOR 2 WEEKS AND THEN INCREASE TO 5 MG 7 DAYS PER WEEK AS TOLERATED (Patient not taking: Reported on 12/18/2024) 90 tablet 3   tamsulosin  (FLOMAX ) 0.4 MG CAPS capsule Take 0.4 mg by mouth daily.     Vilazodone  HCl 20 MG TABS TAKE 1 TABLET BY MOUTH DAILY WITH BREAKFAST AND 0.5 TABLETS (10 MG TOTAL) DAILY WITH SUPPER. 135 tablet 1   No current facility-administered medications for this visit.    Medication Side Effects: None  Allergies: Allergies[1]  Past Medical History:  Diagnosis Date   Allergic rhinitis    Allergy    Anxiety    Depression    Diabetes mellitus without complication (HCC)    type 2   Heart murmur    per pt told it was functional   History of kidney stones    History of nephrolithiasis    Hyperlipidemia    Hypertension    Sebaceous cyst     Family History  Problem Relation Age of Onset   Diabetes Father    Hypertension Father    Kidney cancer Father        now on dialysis   Kidney Stones Father    Kidney Stones Brother    Huntington's disease Maternal Grandfather    Depression Daughter    Anxiety disorder Daughter    ADD / ADHD Daughter    Colon polyps Daughter 56   Colon cancer Neg Hx    Esophageal  cancer Neg Hx    Stomach cancer Neg Hx    Rectal cancer Neg Hx     Social History   Socioeconomic History   Marital status: Married    Spouse name: Not on file   Number of children: 1   Years of education: 18   Highest education level: Master's degree (e.g., MA, MS, MEng, MEd, MSW, MBA)  Occupational History   Occupation: MINISTER    Employer: [employer]  Tobacco Use   Smoking status: Never   Smokeless tobacco: Never  Vaping Use   Vaping status: Never Used  Substance and Sexual Activity   Alcohol use: Yes    Comment: less than 1 a week   Drug use: No   Sexual activity: Yes  Other Topics Concern   Not on file  Social History Narrative   Married  Regular exercise- no   HH of 3   Dog car and lizard   Sleep wakening at times   New job [employer]   Per week reg sleep.    Social Drivers of Health   Tobacco Use: Low Risk (12/18/2024)   Patient History    Smoking Tobacco Use: Never    Smokeless Tobacco Use: Never    Passive Exposure: Not on file  Financial Resource Strain: Low Risk (12/18/2024)   Overall Financial Resource Strain (CARDIA)    Difficulty of Paying Living Expenses: Not hard at all  Food Insecurity: No Food Insecurity (12/18/2024)   Epic    Worried About Programme Researcher, Broadcasting/film/video in the Last Year: Never true    Ran Out of Food in the Last Year: Never true  Transportation Needs: No Transportation Needs (12/18/2024)   Epic    Lack of Transportation (Medical): No    Lack of Transportation (Non-Medical): No  Physical Activity: Insufficiently Active (12/18/2024)   Exercise Vital Sign    Days of Exercise per Week: 4 days    Minutes of Exercise per Session: 20 min  Stress: No Stress Concern Present (12/18/2024)   [employer] of Occupational Health - Occupational Stress Questionnaire    Feeling of Stress: Only a little  Social Connections: Moderately Isolated (12/18/2024)   Social Connection and Isolation Panel    Frequency of Communication with  Friends and Family: More than three times a week    Frequency of Social Gatherings with Friends and Family: Three times a week    Attends Religious Services: Patient declined    Active Member of Clubs or Organizations: No    Attends Banker Meetings: Not on file    Marital Status: Married  Intimate Partner Violence: Unknown (03/11/2022)   Received from [facility]   HITS    Physically Hurt: Not on file    Insult or Talk Down To: Not on file    Threaten Physical Harm: Not on file    Scream or Curse: Not on file  Depression (PHQ2-9): Low Risk (12/18/2024)   Depression (PHQ2-9)    PHQ-2 Score: 3  Alcohol Screen: Low Risk (12/18/2024)   Alcohol Screen    Last Alcohol Screening Score (AUDIT): 1  Housing: Low Risk (12/18/2024)   Epic    Unable to Pay for Housing in the Last Year: No    Number of Times Moved in the Last Year: 0    Homeless in the Last Year: No  Utilities: Not on file  Health Literacy: Not on file    Past Medical History, Surgical history, Social history, and Family history were reviewed and updated as appropriate.   Please see review of systems for further details on the patient's review from today.   Objective:   Physical Exam:  There were no vitals taken for this visit.  Physical Exam Constitutional:      General: He is not in acute distress. Musculoskeletal:        General: No deformity.  Neurological:     Mental Status: He is alert and oriented to person, place, and time.     Coordination: Coordination normal.  Psychiatric:        Attention and Perception: Attention and perception normal. He does not perceive auditory or visual hallucinations.        Mood and Affect: Mood normal. Mood is not anxious or depressed. Affect is not labile, blunt, angry or inappropriate.  Speech: Speech normal.        Behavior: Behavior normal.        Thought Content: Thought content normal. Thought content is not paranoid or delusional. Thought content does  not include homicidal or suicidal ideation. Thought content does not include homicidal or suicidal plan.        Cognition and Memory: Cognition and memory normal.        Judgment: Judgment normal.     Comments: Insight intact     Lab Review:     Component Value Date/Time   NA 142 07/09/2024 0838   NA 141 08/17/2021 0000   K 4.0 07/09/2024 0838   CL 107 07/09/2024 0838   CO2 27 07/09/2024 0838   GLUCOSE 119 (H) 07/09/2024 0838   BUN 14 07/09/2024 0838   CREATININE 1.18 07/09/2024 0838   CREATININE 1.15 10/19/2020 1206   CALCIUM  9.0 07/09/2024 0838   PROT 6.9 12/18/2024 1017   ALBUMIN 4.4 12/18/2024 1017   AST 19 12/18/2024 1017   ALT 17 12/18/2024 1017   ALKPHOS 58 12/18/2024 1017   BILITOT 0.7 12/18/2024 1017   GFRNONAA 53 (L) 05/27/2024 2125   GFRNONAA 68 10/19/2020 1206   GFRAA 83.5 08/17/2021 0000   GFRAA 79 10/19/2020 1206       Component Value Date/Time   WBC 9.8 07/09/2024 0838   RBC 5.15 07/09/2024 0838   HGB 16.0 07/09/2024 0838   HCT 47.3 07/09/2024 0838   PLT 243.0 07/09/2024 0838   MCV 91.8 07/09/2024 0838   MCV 94.2 04/29/2013 0936   MCH 31.0 05/27/2024 2125   MCHC 33.9 07/09/2024 0838   RDW 13.4 07/09/2024 0838   LYMPHSABS 2.3 07/09/2024 0838   MONOABS 1.1 (H) 07/09/2024 0838   EOSABS 1.1 (H) 07/09/2024 0838   BASOSABS 0.1 07/09/2024 0838    No results found for: POCLITH, LITHIUM   No results found for: PHENYTOIN, PHENOBARB, VALPROATE, CBMZ   .res Assessment: Plan:   Plan:  Continue: Concerta  - 18mg  every morning - BP WNL per patient - will continue for now. Wellbutrin  SR 150 mg daily in the afternoon for mood and off-label indication of ADHD.  Wellbutrin  XL150 mg daily in the morning for depression.  Viibryd  20 mg in the morning and 10 mg daily in the afternoon for anxiety and depression - discussed possible taper.  Alprazolam  0.5 mg 1/2-1 tab as needed for anxiety and panic.   Recommend continuing therapy with Bambi Cottle,  LCSW.  RTC 10 weeks  25 minutes spent dedicated to the care of this patient on the date of this encounter to include pre-visit review of records, ordering of medication, post visit documentation, and face-to-face time with the patient discussing current medication regimen.   Discussed potential benefits, risk, and side effects of benzodiazepines to include potential risk of tolerance and dependence, as well as possible drowsiness.  Advised patient not to drive if experiencing drowsiness and to take lowest possible effective dose to minimize risk of dependence and tolerance.  Discussed potential benefits, risks, and side effects of stimulants with patient to include increased heart rate, palpitations, insomnia, increased anxiety, increased irritability, or decreased appetite.  Instructed patient to contact office if experiencing any significant tolerability issues.   Patient advised to contact office with any questions, adverse effects, or acute worsening in signs and symptoms.  There are no diagnoses linked to this encounter.   Please see After Visit Summary for patient specific instructions.  Future Appointments  Date Time Provider Department  Center  01/09/2025 10:00 AM Vedanth Sirico Nattalie, NP [facility] None  01/17/2025  2:00 PM Cottle, Bambi G, LCSW [id number] None  06/17/2025 10:00 AM Panosh, Apolinar POUR, MD [id number] [address]    No orders of the defined types were placed in this encounter.     -------------------------------     [1]  Allergies Allergen Reactions   Sodium Nitrite Nausea Only   Statins Other (See Comments)    Myalgias      Simvastatin Other (See Comments)    Myalgias and arthralgia (joint pain)   Penicillins Hives and Other (See Comments)    Per pt - happened in childhood - hives possible reaction

## 2025-01-17 ENCOUNTER — Ambulatory Visit: Payer: Self-pay | Admitting: Psychology

## 2025-03-17 ENCOUNTER — Ambulatory Visit: Admitting: Adult Health

## 2025-06-17 ENCOUNTER — Ambulatory Visit: Payer: Self-pay | Admitting: Internal Medicine
